# Patient Record
Sex: Female | Born: 1960 | Hispanic: No | Marital: Single | Smoking: Former smoker
Health system: Southern US, Community
[De-identification: ages and names within clinical notes are randomized; demographics above are authoritative.]

## PROBLEM LIST (undated history)

## (undated) DIAGNOSIS — K449 Diaphragmatic hernia without obstruction or gangrene: Secondary | ICD-10-CM

## (undated) DIAGNOSIS — E785 Hyperlipidemia, unspecified: Secondary | ICD-10-CM

## (undated) DIAGNOSIS — R000 Tachycardia, unspecified: Secondary | ICD-10-CM

## (undated) DIAGNOSIS — Z9889 Other specified postprocedural states: Secondary | ICD-10-CM

## (undated) DIAGNOSIS — H269 Unspecified cataract: Secondary | ICD-10-CM

## (undated) DIAGNOSIS — K2270 Barrett's esophagus without dysplasia: Secondary | ICD-10-CM

## (undated) DIAGNOSIS — I739 Peripheral vascular disease, unspecified: Secondary | ICD-10-CM

## (undated) DIAGNOSIS — K5790 Diverticulosis of intestine, part unspecified, without perforation or abscess without bleeding: Secondary | ICD-10-CM

## (undated) DIAGNOSIS — R112 Nausea with vomiting, unspecified: Secondary | ICD-10-CM

## (undated) DIAGNOSIS — T7840XA Allergy, unspecified, initial encounter: Secondary | ICD-10-CM

## (undated) DIAGNOSIS — K219 Gastro-esophageal reflux disease without esophagitis: Secondary | ICD-10-CM

## (undated) DIAGNOSIS — Z972 Presence of dental prosthetic device (complete) (partial): Secondary | ICD-10-CM

## (undated) DIAGNOSIS — I10 Essential (primary) hypertension: Secondary | ICD-10-CM

## (undated) DIAGNOSIS — E119 Type 2 diabetes mellitus without complications: Secondary | ICD-10-CM

## (undated) HISTORY — PX: WISDOM TOOTH EXTRACTION: SHX21

## (undated) HISTORY — DX: Diverticulosis of intestine, part unspecified, without perforation or abscess without bleeding: K57.90

## (undated) HISTORY — DX: Unspecified cataract: H26.9

## (undated) HISTORY — DX: Allergy, unspecified, initial encounter: T78.40XA

## (undated) HISTORY — DX: Essential (primary) hypertension: I10

## (undated) HISTORY — PX: APPENDECTOMY: SHX54

## (undated) HISTORY — PX: COLONOSCOPY: SHX174

## (undated) HISTORY — PX: OTHER SURGICAL HISTORY: SHX169

## (undated) HISTORY — DX: Type 2 diabetes mellitus without complications: E11.9

## (undated) HISTORY — DX: Hyperlipidemia, unspecified: E78.5

## (undated) HISTORY — DX: Gastro-esophageal reflux disease without esophagitis: K21.9

## (undated) HISTORY — DX: Diaphragmatic hernia without obstruction or gangrene: K44.9

## (undated) HISTORY — DX: Tachycardia, unspecified: R00.0

## (undated) HISTORY — DX: Barrett's esophagus without dysplasia: K22.70

## (undated) HISTORY — PX: CATARACT EXTRACTION: SUR2

---

## 2013-09-12 ENCOUNTER — Ambulatory Visit (INDEPENDENT_AMBULATORY_CARE_PROVIDER_SITE_OTHER): Payer: BC Managed Care – PPO | Admitting: Family Medicine

## 2013-09-12 ENCOUNTER — Encounter: Payer: Self-pay | Admitting: Family Medicine

## 2013-09-12 VITALS — BP 154/83 | HR 96 | Ht 64.0 in | Wt 214.0 lb

## 2013-09-12 DIAGNOSIS — Z794 Long term (current) use of insulin: Secondary | ICD-10-CM

## 2013-09-12 DIAGNOSIS — E118 Type 2 diabetes mellitus with unspecified complications: Secondary | ICD-10-CM | POA: Insufficient documentation

## 2013-09-12 DIAGNOSIS — I10 Essential (primary) hypertension: Secondary | ICD-10-CM

## 2013-09-12 DIAGNOSIS — Z1211 Encounter for screening for malignant neoplasm of colon: Secondary | ICD-10-CM

## 2013-09-12 DIAGNOSIS — E785 Hyperlipidemia, unspecified: Secondary | ICD-10-CM

## 2013-09-12 DIAGNOSIS — E1149 Type 2 diabetes mellitus with other diabetic neurological complication: Secondary | ICD-10-CM

## 2013-09-12 DIAGNOSIS — R8290 Unspecified abnormal findings in urine: Secondary | ICD-10-CM

## 2013-09-12 DIAGNOSIS — E1142 Type 2 diabetes mellitus with diabetic polyneuropathy: Secondary | ICD-10-CM

## 2013-09-12 DIAGNOSIS — R82998 Other abnormal findings in urine: Secondary | ICD-10-CM

## 2013-09-12 DIAGNOSIS — E119 Type 2 diabetes mellitus without complications: Secondary | ICD-10-CM

## 2013-09-12 HISTORY — DX: Hyperlipidemia, unspecified: E78.5

## 2013-09-12 HISTORY — DX: Essential (primary) hypertension: I10

## 2013-09-12 HISTORY — DX: Type 2 diabetes mellitus without complications: E11.9

## 2013-09-12 LAB — POCT URINALYSIS DIPSTICK
BILIRUBIN UA: NEGATIVE
Ketones, UA: NEGATIVE
NITRITE UA: NEGATIVE
PH UA: 7.5
PROTEIN UA: NEGATIVE
RBC UA: NEGATIVE
Spec Grav, UA: 1.015
Urobilinogen, UA: 1

## 2013-09-12 MED ORDER — LOSARTAN POTASSIUM-HCTZ 100-25 MG PO TABS: 1.0000 | ORAL_TABLET | Freq: Every day | ORAL | Status: DC

## 2013-09-12 MED ORDER — PREGABALIN 50 MG PO CAPS: 50.0000 mg | ORAL_CAPSULE | Freq: Three times a day (TID) | ORAL | Status: DC

## 2013-09-12 MED ORDER — CIPROFLOXACIN HCL 250 MG PO TABS: ORAL_TABLET | ORAL | Status: AC

## 2013-09-12 NOTE — Addendum Note (Signed)
Addended by: Terance Hart on: 09/12/2013 10:53 AM   Modules accepted: Orders

## 2013-09-12 NOTE — Progress Notes (Signed)
CC: Julie Prince is a 53 y.o. female is here for Establish Care   Subjective: HPI:  Very pleasant 53 year old here to establish care  Patient reports a history of type 2 diabetes currently on glimepiride and leve hemir. She believes her A1c was just slightly above 7 when it was checked back in May with her endocrinologist.  Denies polyuria polyphasia polydipsia nor poorly healing wounds. On review of systems she does endorse bilateral feet discomfort described as tingling, heaviness, burning that is absent when active and only present with rest. Symptoms are mild in severity and present on a daily basis. Nothing particularly makes better or worse and there has been no overlying skin changes nor swelling.  Reports a history of hyperlipidemia without right upper quadrant pain or myalgias since starting pravastatin on a daily basis. She believes her cholesterol checked within the last year.  Reports a history of essential hypertension and has been on losartan-hydrochlorothiazide for a month now. No outside blood pressures to report other than it being high to a degree that she thinks was about 140/90 when she saw her endocrinologist last.  She is in acute complaint of malodorous urine for the last one-2 days. She's worried that she might have a UTI. Denies dysuria, urgency, frequency nor any other genitourinary complaints.  She tells me that she's never had a colonoscopy and wants to know if she's due for this.   Review of Systems - General ROS: negative for - chills, fever, night sweats, weight gain or weight loss Ophthalmic ROS: negative for - decreased vision Psychological ROS: negative for - anxiety or depression ENT ROS: negative for - hearing change, nasal congestion, tinnitus or allergies Hematological and Lymphatic ROS: negative for - bleeding problems, bruising or swollen lymph nodes Breast ROS: negative Respiratory ROS: no cough, shortness of breath, or wheezing Cardiovascular ROS:  no chest pain or dyspnea on exertion Gastrointestinal ROS: no abdominal pain, change in bowel habits, or black or bloody stools Genito-Urinary ROS: negative for - genital discharge, genital ulcers, incontinence or abnormal bleeding from genitals Musculoskeletal ROS: negative for - joint pain or muscle pain Neurological ROS: negative for - headaches or memory loss Dermatological ROS: negative for lumps, mole changes, rash and skin lesion changes  Past Medical History  Diagnosis Date  . Essential hypertension, benign 09/12/2013  . Hyperlipidemia 09/12/2013  . Type 2 diabetes mellitus 09/12/2013    Dr. Posey Pronto at [place]     No past surgical history on file. No family history on file.  History   Social History  . Marital Status: Married    Spouse Name: N/A    Number of Children: N/A  . Years of Education: N/A   Occupational History  . Not on file.   Social History Main Topics  . Smoking status: Not on file  . Smokeless tobacco: Not on file  . Alcohol Use: Not on file  . Drug Use: Not on file  . Sexual Activity: Not on file   Other Topics Concern  . Not on file   Social History Narrative  . No narrative on file     Objective: BP 154/83  Pulse 96  Ht 5\' 4"  (1.626 m)  Wt 214 lb (97.07 kg)  BMI 36.72 kg/m2   General: Alert and Oriented, No Acute Distress HEENT: Pupils equal, round, reactive to light. Conjunctivae clear.   moist mucous membrane pharynx unremarkable  Lungs: Clear to auscultation bilaterally, no wheezing/ronchi/rales.  Comfortable work of breathing. Good air movement. Cardiac:  Regular rate and rhythm. Normal S1/S2.  No murmurs, rubs, nor gallops.   no carotid bruits  Abdomen:  obese and soft  Extremities: No peripheral edema.  Strong peripheral pulses.  no overlying skin changes in either foot, pain is not reproduced with palpation of navicular, medial or lateral malleoli nor base of the fifth metatarsal. There is no swelling redness or warmth in  either foot  Mental Status: No depression, anxiety, nor agitation. Skin: Warm and dry.  Assessment & Plan: Julie Prince was seen today for establish care.  Diagnoses and associated orders for this visit:  Type 2 diabetes mellitus without complication  Hyperlipidemia  Essential hypertension, benign - losartan-hydrochlorothiazide (HYZAAR) 100-25 MG per tablet; Take 1 tablet by mouth daily.  Malodorous urine  Special screening for malignant neoplasms, colon - Ambulatory referral to Gastroenterology  Diabetic peripheral neuropathy associated with type 2 diabetes mellitus - pregabalin (LYRICA) 50 MG capsule; Take 1 capsule (50 mg total) by mouth 3 (three) times daily.     type 2 diabetes: Management deferred to endocrinology   hyperlipidemia: Requesting outside records of her most recent lipid panel and liver enzymes Malodorous urine: Start Cipro we will follow culture She's overdue for routine colon cancer screening and a referral has been placed I've asked her to call me if she's not in contact about scheduling this by next week Diabetic peripheral neuropathy: Start Lyrica 3 weeks of samples were provided, call me after 2 weeks if it provided any benefit so I can send in a formal prescription Essential hypertension: Uncontrolled chronic condition increasing Hyzaar  Return in about 3 months (around 12/13/2013) for BP and Diabetic Followup.

## 2013-09-14 LAB — URINE CULTURE: Colony Count: >=100000

## 2013-10-03 DIAGNOSIS — G5600 Carpal tunnel syndrome, unspecified upper limb: Secondary | ICD-10-CM | POA: Insufficient documentation

## 2013-10-03 DIAGNOSIS — M5136 Other intervertebral disc degeneration, lumbar region: Secondary | ICD-10-CM | POA: Insufficient documentation

## 2013-10-03 DIAGNOSIS — M51369 Other intervertebral disc degeneration, lumbar region without mention of lumbar back pain or lower extremity pain: Secondary | ICD-10-CM | POA: Insufficient documentation

## 2013-10-03 DIAGNOSIS — G629 Polyneuropathy, unspecified: Secondary | ICD-10-CM | POA: Insufficient documentation

## 2013-10-03 DIAGNOSIS — M659 Synovitis and tenosynovitis, unspecified: Secondary | ICD-10-CM | POA: Insufficient documentation

## 2013-10-03 DIAGNOSIS — M545 Low back pain, unspecified: Secondary | ICD-10-CM | POA: Insufficient documentation

## 2013-10-03 DIAGNOSIS — M1990 Unspecified osteoarthritis, unspecified site: Secondary | ICD-10-CM | POA: Insufficient documentation

## 2013-10-03 DIAGNOSIS — E1165 Type 2 diabetes mellitus with hyperglycemia: Secondary | ICD-10-CM | POA: Insufficient documentation

## 2013-10-03 DIAGNOSIS — Q664 Congenital talipes calcaneovalgus, unspecified foot: Secondary | ICD-10-CM | POA: Insufficient documentation

## 2013-10-03 DIAGNOSIS — M47816 Spondylosis without myelopathy or radiculopathy, lumbar region: Secondary | ICD-10-CM | POA: Insufficient documentation

## 2013-12-03 ENCOUNTER — Ambulatory Visit: Payer: BC Managed Care – PPO | Admitting: Family Medicine

## 2013-12-13 ENCOUNTER — Ambulatory Visit: Payer: BC Managed Care – PPO | Admitting: Family Medicine

## 2013-12-18 ENCOUNTER — Encounter: Payer: Self-pay | Admitting: Family Medicine

## 2013-12-18 ENCOUNTER — Ambulatory Visit (INDEPENDENT_AMBULATORY_CARE_PROVIDER_SITE_OTHER): Payer: BC Managed Care – PPO | Admitting: Family Medicine

## 2013-12-18 VITALS — BP 160/87 | HR 89 | Wt 219.0 lb

## 2013-12-18 DIAGNOSIS — I10 Essential (primary) hypertension: Secondary | ICD-10-CM | POA: Diagnosis not present

## 2013-12-18 DIAGNOSIS — E1142 Type 2 diabetes mellitus with diabetic polyneuropathy: Secondary | ICD-10-CM

## 2013-12-18 DIAGNOSIS — E1149 Type 2 diabetes mellitus with other diabetic neurological complication: Secondary | ICD-10-CM

## 2013-12-18 MED ORDER — PREGABALIN 150 MG PO CAPS: 150.0000 mg | ORAL_CAPSULE | Freq: Two times a day (BID) | ORAL | Status: DC

## 2013-12-18 MED ORDER — AMLODIPINE BESYLATE 5 MG PO TABS: 5.0000 mg | ORAL_TABLET | Freq: Every day | ORAL | Status: DC

## 2013-12-18 NOTE — Progress Notes (Signed)
CC: Julie Prince is a 53 y.o. female is here for f/u lyrica   Subjective: HPI:  Followup type 2 diabetes: He continues to get care from Dr. Posey Pronto, there have been no changes to her medication regimen. She is uncertain what her last A1c was and she has no outside blood sugars to report. Denies Polyuria polyphasia or polydipsia  Essential hypertension: At her last visit Hyzaar was doubled to a full dose. She's been taking this on a daily basis for the past 2 months. She had a headache this morning that resolved without intervention denies any other headaches, motor or sensory disturbances other than that described below, chest pain, shortness of breath orthopnea nor peripheral edema. Denies any known side effects from this medication  Followup diabetic peripheral neuropathy: She was taking Lyrica 50 mg 3 times a day and states it helped with the tingling and burning of her feet which actually resolved after stopping the medication but she still has a heaviness that is described as moderate in severity present only with activity and improves with getting up and walking. It sounds like there is a mild numbness component to this, it is bilateral and symmetrical. She denies any other motor or sensory disturbances elsewhere   Review Of Systems Outlined In HPI  Past Medical History  Diagnosis Date  . Essential hypertension, benign 09/12/2013  . Hyperlipidemia 09/12/2013  . Type 2 diabetes mellitus 09/12/2013    Dr. Posey Pronto at [place]     No past surgical history on file. No family history on file.  History   Social History  . Marital Status: Married    Spouse Name: N/A    Number of Children: N/A  . Years of Education: N/A   Occupational History  . Not on file.   Social History Main Topics  . Smoking status: Never Smoker   . Smokeless tobacco: Not on file  . Alcohol Use: Not on file  . Drug Use: Not on file  . Sexual Activity: Not on file   Other Topics Concern  . Not on  file   Social History Narrative  . No narrative on file     Objective: BP 160/87  Pulse 89  Wt 219 lb (99.338 kg)  General: Alert and Oriented, No Acute Distress HEENT: Pupils equal, round, reactive to light. Conjunctivae clear.  Moist membranes pharynx unremarkable. Lungs: Clear to auscultation bilaterally, no wheezing/ronchi/rales.  Comfortable work of breathing. Good air movement. Cardiac: Regular rate and rhythm. Normal S1/S2.  No murmurs, rubs, nor gallops.   Diabetic Foot Exam: Dorsalis pedis pulses 1+ bilaterally.  Light touch sensation intact on plantar and dorsal surface bilaterally.  No signs of infection, skin breakdown, nor ulceration. Extremities: No peripheral edema.  Strong peripheral pulses.  Mental Status: No depression, anxiety, nor agitation. Skin: Warm and dry.  Assessment & Plan: [name] was seen today for f/u lyrica.  Diagnoses and associated orders for this visit:  Diabetic peripheral neuropathy associated with type 2 diabetes mellitus - pregabalin (LYRICA) 150 MG capsule; Take 1 capsule (150 mg total) by mouth 2 (two) times daily.  Essential hypertension, benign - amLODipine (NORVASC) 5 MG tablet; Take 1 tablet (5 mg total) by mouth daily.    Diabetic peripheral neuropathy: Uncontrolled restart Lyrica 150 mg twice a day Essential hypertension: Uncontrolled chronic condition continue Hyzaar and starting amlodipine   Return in about 4 weeks (around 01/15/2014) for BP.

## 2013-12-26 ENCOUNTER — Telehealth: Payer: Self-pay | Admitting: Family Medicine

## 2013-12-26 ENCOUNTER — Telehealth: Payer: Self-pay | Admitting: *Deleted

## 2013-12-26 DIAGNOSIS — E1142 Type 2 diabetes mellitus with diabetic polyneuropathy: Secondary | ICD-10-CM

## 2013-12-26 MED ORDER — GABAPENTIN 300 MG PO CAPS: 300.0000 mg | ORAL_CAPSULE | Freq: Three times a day (TID) | ORAL | Status: DC

## 2013-12-26 NOTE — Telephone Encounter (Signed)
Julie Prince, Will you please let patient know that [employer] contacted me about not covering Lyrica until she tries alternative cheaper options for her diabetic nerve pain.  I'd recommend she try gabapentin which I've sent to her [facility] pharmacy as an alternative. Stop any lyrica she has left over.

## 2013-12-26 NOTE — Telephone Encounter (Signed)
Pt called and left a message that she wants all rx sent to [facility] in [place]. I called and canceled gabapentin rx that was sent and called it in to [facility] in [place]

## 2013-12-26 NOTE — Telephone Encounter (Signed)
Pt.notified

## 2014-01-02 ENCOUNTER — Telehealth: Payer: Self-pay | Admitting: *Deleted

## 2014-01-02 DIAGNOSIS — K5229 Other allergic and dietetic gastroenteritis and colitis: Secondary | ICD-10-CM

## 2014-01-02 NOTE — Telephone Encounter (Signed)
Pt requested a referral for food allergy testing; She states she has a lot of bloating and abd discomfort and"swelling" at times  she feels she is allergic to something she eats but she cannot pinpoint.  I am going to place  a referral to an allergist for her.

## 2014-01-21 ENCOUNTER — Ambulatory Visit: Payer: BC Managed Care – PPO | Admitting: Family Medicine

## 2014-01-23 ENCOUNTER — Encounter: Payer: Self-pay | Admitting: Family Medicine

## 2014-01-23 DIAGNOSIS — J309 Allergic rhinitis, unspecified: Secondary | ICD-10-CM | POA: Insufficient documentation

## 2014-01-29 ENCOUNTER — Ambulatory Visit: Payer: BC Managed Care – PPO | Admitting: Family Medicine

## 2014-02-11 ENCOUNTER — Telehealth: Payer: Self-pay | Admitting: Family Medicine

## 2014-02-11 ENCOUNTER — Encounter: Payer: Self-pay | Admitting: Internal Medicine

## 2014-02-11 DIAGNOSIS — Z1211 Encounter for screening for malignant neoplasm of colon: Secondary | ICD-10-CM

## 2014-02-11 NOTE — Telephone Encounter (Signed)
Placing updated colonoscopy referral

## 2014-02-18 ENCOUNTER — Ambulatory Visit (INDEPENDENT_AMBULATORY_CARE_PROVIDER_SITE_OTHER): Payer: BC Managed Care – HMO | Admitting: Family Medicine

## 2014-02-18 ENCOUNTER — Encounter: Payer: Self-pay | Admitting: Family Medicine

## 2014-02-18 VITALS — BP 173/80 | HR 79 | Wt 217.0 lb

## 2014-02-18 DIAGNOSIS — G629 Polyneuropathy, unspecified: Secondary | ICD-10-CM

## 2014-02-18 DIAGNOSIS — E1142 Type 2 diabetes mellitus with diabetic polyneuropathy: Secondary | ICD-10-CM

## 2014-02-18 DIAGNOSIS — E1342 Other specified diabetes mellitus with diabetic polyneuropathy: Secondary | ICD-10-CM | POA: Diagnosis not present

## 2014-02-18 DIAGNOSIS — K2100 Gastro-esophageal reflux disease with esophagitis, without bleeding: Secondary | ICD-10-CM

## 2014-02-18 MED ORDER — PANTOPRAZOLE SODIUM 40 MG PO TBEC: 40.0000 mg | DELAYED_RELEASE_TABLET | Freq: Every day | ORAL | Status: DC

## 2014-02-18 MED ORDER — PREGABALIN 150 MG PO CAPS: 150.0000 mg | ORAL_CAPSULE | Freq: Two times a day (BID) | ORAL | Status: DC

## 2014-02-18 NOTE — Progress Notes (Signed)
CC: Julie Prince is a 53 y.o. female is here for GERD?   Subjective: HPI:  Complains of abdominal bloating which is generalized and accompanied by a burning sensation that radiates up behind her sternum into the back of her mouth. Symptoms are moderate in severity and worse after eating large meals. Symptoms are slightly improved with taking probiotics. No other interventions as of yet. She denies any change to her bowel habits and denies diarrhea or constipation. She denies awakening because of the pain. Denies vomiting or decreased appetite. Symptoms present for at least 2-3 weeks now.  Complaints of continued burning in both feet. She is afraid that she is having circulation issues and might need an amputation in the near future. Symptoms are worse at rest and particularly bad when trying to fall asleep. No benefit from gabapentin. Denies any new skin changes overlying the feet or swelling of the feet.   Review Of Systems Outlined In HPI  Past Medical History  Diagnosis Date  . Essential hypertension, benign 09/12/2013  . Hyperlipidemia 09/12/2013  . Type 2 diabetes mellitus 09/12/2013    Dr. Posey Pronto at [place]     No past surgical history on file. No family history on file.  History   Social History  . Marital Status: Married    Spouse Name: N/A    Number of Children: N/A  . Years of Education: N/A   Occupational History  . Not on file.   Social History Main Topics  . Smoking status: Never Smoker   . Smokeless tobacco: Not on file  . Alcohol Use: Not on file  . Drug Use: Not on file  . Sexual Activity: Not on file   Other Topics Concern  . Not on file   Social History Narrative     Objective: BP 173/80 mmHg  Pulse 79  Wt 217 lb (98.431 kg)  General: Alert and Oriented, No Acute Distress HEENT: Pupils equal, round, reactive to light. Conjunctivae clear.  Moist because membranes pharynx unremarkable Lungs: Clear comfortable work of breathing Cardiac:  Regular rate and rhythm.  Abdomen: Obese, Normal bowel sounds, soft and non tender without palpable masses. Diabetic Foot Exam: Dorsalis pedis pulses 1+ bilaterally.  Monofilament sensation intact on plantar and dorsal surface bilaterally.  No signs of infection, skin breakdown, nor ulceration. Extremities: No peripheral edema.  Strong peripheral pulses.  Mental Status: No depression, anxiety, nor agitation. Skin: Warm and dry.  Assessment & Plan: [name] was seen today for gerd?.  Diagnoses and associated orders for this visit:  Gastroesophageal reflux disease with esophagitis - pantoprazole (PROTONIX) 40 MG tablet; Take 1 tablet (40 mg total) by mouth daily.  Diabetic peripheral neuropathy - pregabalin (LYRICA) 150 MG capsule; Take 1 capsule (150 mg total) by mouth 2 (two) times daily.    GERD: Uncontrolled start Protonix Diabetic peripheral neuropathy: Uncontrolled chronic condition re-prescribing Lyrica in hopes that her insurance will not cover this now that she has failed on gabapentin. I used a Doppler today to acoustically reinforce to her that she has good circulation in her lower extremities.  She tells me she has not taken her blood pressure medication over 24 hours due to forgetfulness. No change to blood pressure medications at this time given this.   Return in about 2 months (around 04/21/2014).

## 2014-02-19 ENCOUNTER — Telehealth: Payer: Self-pay | Admitting: *Deleted

## 2014-02-19 NOTE — Telephone Encounter (Signed)
Lyrica approved. Pharmacy and patient notified. Margette Fast, CMA

## 2014-03-11 ENCOUNTER — Ambulatory Visit (AMBULATORY_SURGERY_CENTER): Payer: Self-pay | Admitting: *Deleted

## 2014-03-11 VITALS — Ht 64.0 in | Wt 219.8 lb

## 2014-03-11 DIAGNOSIS — Z1211 Encounter for screening for malignant neoplasm of colon: Secondary | ICD-10-CM

## 2014-03-11 MED ORDER — MOVIPREP 100 G PO SOLR: 1.0000 | Freq: Once | ORAL | Status: DC

## 2014-03-11 NOTE — Progress Notes (Signed)
No egg or soy allergy. ewm No home 02 use. ewm No diet pills. ewm No issues with past sedation. ewm Pt emmi video to e mail. ewm

## 2014-03-24 ENCOUNTER — Encounter: Payer: Self-pay | Admitting: Internal Medicine

## 2014-03-24 ENCOUNTER — Ambulatory Visit (AMBULATORY_SURGERY_CENTER): Payer: BLUE CROSS/BLUE SHIELD | Admitting: Internal Medicine

## 2014-03-24 VITALS — BP 142/82 | HR 72 | Temp 97.2°F | Resp 15 | Ht 64.0 in | Wt 219.0 lb

## 2014-03-24 DIAGNOSIS — Z1211 Encounter for screening for malignant neoplasm of colon: Secondary | ICD-10-CM

## 2014-03-24 DIAGNOSIS — D128 Benign neoplasm of rectum: Secondary | ICD-10-CM

## 2014-03-24 DIAGNOSIS — D129 Benign neoplasm of anus and anal canal: Secondary | ICD-10-CM

## 2014-03-24 MED ORDER — SODIUM CHLORIDE 0.9 % IV SOLN: 500.0000 mL | INTRAVENOUS | Status: DC

## 2014-03-24 NOTE — Op Note (Signed)
[facility]  [address]. [place], [address]   COLONOSCOPY PROCEDURE REPORT  PATIENT: Julie, Prince  MR#: [id number] BIRTHDATE: 1961/02/24 , 18  yrs. old GENDER: female ENDOSCOPIST: Jerene Bears, MD REFERRED BY: Marcial Pacas, DO PROCEDURE DATE:  03/24/2014 PROCEDURE:   Colonoscopy with snare polypectomy First Screening Colonoscopy - Avg.  risk and is 50 yrs.  old or older Yes.  Prior Negative Screening - Now for repeat screening. N/A  History of Adenoma - Now for follow-up colonoscopy & has been > or = to 3 yrs.  N/A  Polyps Removed Today? Yes. ASA CLASS:   Class III INDICATIONS:average risk for colon cancer and first colonoscopy. MEDICATIONS: Monitored anesthesia care and Propofol 300 mg IV  DESCRIPTION OF PROCEDURE:   After the risks benefits and alternatives of the procedure were thoroughly explained, informed consent was obtained.  The digital rectal exam revealed no rectal mass.   The LB [id number]  endoscope was introduced through the anus and advanced to the cecum, which was identified by both the appendix and ileocecal valve. No adverse events experienced. The quality of the prep was good, using MoviPrep  The instrument was then slowly withdrawn as the colon was fully examined.  COLON FINDINGS: A semi-pedunculated polyp ranging from 15 to 70mm in size was found in the rectum and sigmoid colon.  A polypectomy was performed using snare cautery.  The resection was complete, the polyp tissue was completely retrieved and sent to histology.  The wound at the site was closed by placing hemoclips.  Two (2) placements were made. There was mild diverticulosis noted in the proximal transverse colon and ascending colon.  Retroflexed views revealed no abnormalities. The time to cecum=4 minutes 22 seconds. Withdrawal time=14 minutes 40 seconds.  The scope was withdrawn and the procedure completed. COMPLICATIONS: There were no immediate  complications.  ENDOSCOPIC IMPRESSION: 1.   Semi-pedunculated polyp ranging from 15 to 46mm in size was found in the rectum and sigmoid colon; polypectomy was performed using snare cautery; the wound at the site was closed by placing hemoclips 2.   Mild diverticulosis was noted in the proximal transverse colon and ascending colon  RECOMMENDATIONS: 1.  Await pathology results 2.  Hold Aspirin and all other NSAIDS for 2 weeks. 3.  Timing of repeat colonoscopy will be determined by pathology findings. 4.  You will receive a letter within 1-2 weeks with the results of your biopsy as well as final recommendations.  Please call my office if you have not received a letter after 3 weeks.  eSigned:  Jerene Bears, MD 03/24/2014 2:35 PM   cc: The Patient, Marcial Pacas, DO

## 2014-03-24 NOTE — Patient Instructions (Addendum)
No aspirin, aspirin products or NSAIDS for two weeks, January 18,2015. Handouts given for Polyps, diverticulosis and high fiber.     YOU HAD AN ENDOSCOPIC PROCEDURE TODAY AT [facility]: Refer to the procedure report that was given to you for any specific questions about what was found during the examination.  If the procedure report does not answer your questions, please call your gastroenterologist to clarify.  If you requested that your care partner not be given the details of your procedure findings, then the procedure report has been included in a sealed envelope for you to review at your convenience later.  YOU SHOULD EXPECT: Some feelings of bloating in the abdomen. Passage of more gas than usual.  Walking can help get rid of the air that was put into your GI tract during the procedure and reduce the bloating. If you had a lower endoscopy (such as a colonoscopy or flexible sigmoidoscopy) you may notice spotting of blood in your stool or on the toilet paper. If you underwent a bowel prep for your procedure, then you may not have a normal bowel movement for a few days.  DIET: Your first meal following the procedure should be a light meal and then it is ok to progress to your normal diet.  A half-sandwich or bowl of soup is an example of a good first meal.  Heavy or fried foods are harder to digest and may make you feel nauseous or bloated.  Likewise meals heavy in dairy and vegetables can cause extra gas to form and this can also increase the bloating.  Drink plenty of fluids but you should avoid alcoholic beverages for 24 hours.  ACTIVITY: Your care partner should take you home directly after the procedure.  You should plan to take it easy, moving slowly for the rest of the day.  You can resume normal activity the day after the procedure however you should NOT DRIVE or use heavy machinery for 24 hours (because of the sedation medicines used during the test).    SYMPTOMS TO  REPORT IMMEDIATELY: A gastroenterologist can be reached at any hour.  During normal business hours, 8:30 AM to 5:00 PM Monday through Friday, call [phone].  After hours and on weekends, please call the GI answering service at [phone] who will take a message and have the physician on call contact you.   Following lower endoscopy (colonoscopy or flexible sigmoidoscopy):  Excessive amounts of blood in the stool  Significant tenderness or worsening of abdominal pains  Swelling of the abdomen that is new, acute  Fever of 100F or higher  FOLLOW UP: If any biopsies were taken you will be contacted by phone or by letter within the next 1-3 weeks.  Call your gastroenterologist if you have not heard about the biopsies in 3 weeks.  Our staff will call the home number listed on your records the next business day following your procedure to check on you and address any questions or concerns that you may have at that time regarding the information given to you following your procedure. This is a courtesy call and so if there is no answer at the home number and we have not heard from you through the emergency physician on call, we will assume that you have returned to your regular daily activities without incident.  SIGNATURES/CONFIDENTIALITY: You and/or your care partner have signed paperwork which will be entered into your electronic medical record.  These signatures attest to the fact that  that the information above on your After Visit Summary has been reviewed and is understood.  Full responsibility of the confidentiality of this discharge information lies with you and/or your care-partner.

## 2014-03-24 NOTE — Progress Notes (Signed)
Called to room to assist during endoscopic procedure.  Patient ID and intended procedure confirmed with present staff. Received instructions for my participation in the procedure from the performing physician.  

## 2014-03-24 NOTE — Progress Notes (Signed)
Procedure ends, to recovery, report given and VSS. 

## 2014-03-25 ENCOUNTER — Telehealth: Payer: Self-pay | Admitting: *Deleted

## 2014-03-25 NOTE — Telephone Encounter (Signed)
Left message that we called for f/u 

## 2014-04-02 ENCOUNTER — Encounter: Payer: Self-pay | Admitting: Internal Medicine

## 2014-04-03 ENCOUNTER — Encounter: Payer: Self-pay | Admitting: Family Medicine

## 2014-04-03 DIAGNOSIS — D126 Benign neoplasm of colon, unspecified: Secondary | ICD-10-CM | POA: Insufficient documentation

## 2014-04-08 ENCOUNTER — Telehealth: Payer: Self-pay | Admitting: Internal Medicine

## 2014-04-08 NOTE — Telephone Encounter (Signed)
Pt had a question regarding her path letter. Discussed with pt that Dr. Hilarie Fredrickson wants to do a flex-sig in July to exam the area where the polyp was removed to make sure it is all removed. Pt verbalized understanding.

## 2014-04-15 ENCOUNTER — Encounter: Payer: BLUE CROSS/BLUE SHIELD | Admitting: Obstetrics & Gynecology

## 2014-07-31 ENCOUNTER — Encounter: Payer: Self-pay | Admitting: Family Medicine

## 2014-07-31 ENCOUNTER — Ambulatory Visit (INDEPENDENT_AMBULATORY_CARE_PROVIDER_SITE_OTHER): Payer: BLUE CROSS/BLUE SHIELD | Admitting: Family Medicine

## 2014-07-31 VITALS — BP 209/94 | HR 89 | Wt 211.0 lb

## 2014-07-31 DIAGNOSIS — E1142 Type 2 diabetes mellitus with diabetic polyneuropathy: Secondary | ICD-10-CM | POA: Diagnosis not present

## 2014-07-31 DIAGNOSIS — I10 Essential (primary) hypertension: Secondary | ICD-10-CM | POA: Diagnosis not present

## 2014-07-31 DIAGNOSIS — G629 Polyneuropathy, unspecified: Secondary | ICD-10-CM

## 2014-07-31 DIAGNOSIS — E119 Type 2 diabetes mellitus without complications: Secondary | ICD-10-CM | POA: Diagnosis not present

## 2014-07-31 DIAGNOSIS — K219 Gastro-esophageal reflux disease without esophagitis: Secondary | ICD-10-CM | POA: Insufficient documentation

## 2014-07-31 DIAGNOSIS — R609 Edema, unspecified: Secondary | ICD-10-CM

## 2014-07-31 LAB — BASIC METABOLIC PANEL WITH GFR
BUN: 11 mg/dL (ref 6–23)
CHLORIDE: 100 meq/L (ref 96–112)
CO2: 25 mEq/L (ref 19–32)
CREATININE: 0.79 mg/dL (ref 0.50–1.10)
Calcium: 9.3 mg/dL (ref 8.4–10.5)
GFR, EST NON AFRICAN AMERICAN: 86 mL/min
GLUCOSE: 398 mg/dL — AB (ref 70–99)
Potassium: 4.3 mEq/L (ref 3.5–5.3)
Sodium: 136 mEq/L (ref 135–145)

## 2014-07-31 LAB — HEMOGLOBIN A1C
HEMOGLOBIN A1C: 11.9 % — AB (ref <5.7)
MEAN PLASMA GLUCOSE: 295 mg/dL — AB (ref <117)

## 2014-07-31 MED ORDER — LOSARTAN POTASSIUM-HCTZ 100-25 MG PO TABS: 1.0000 | ORAL_TABLET | Freq: Every day | ORAL | Status: DC

## 2014-07-31 MED ORDER — FUROSEMIDE 20 MG PO TABS: 20.0000 mg | ORAL_TABLET | Freq: Every day | ORAL | Status: DC | PRN

## 2014-07-31 MED ORDER — INSULIN GLARGINE 300 UNIT/ML ~~LOC~~ SOPN: 22.0000 [IU] | PEN_INJECTOR | Freq: Every day | SUBCUTANEOUS | Status: DC

## 2014-07-31 MED ORDER — AMLODIPINE BESYLATE 5 MG PO TABS: 5.0000 mg | ORAL_TABLET | Freq: Every day | ORAL | Status: DC

## 2014-07-31 NOTE — Progress Notes (Addendum)
CC: Julie Prince is a 54 y.o. female is here for f/u colonoscopy   Subjective: HPI:  Follow-up type 2 diabetes: This going to be financially difficult for her to continue to see her endocrinologist she wants know if I can help with diabetes management. She is currently taking Amaryl on a daily basis twice a day. No outside blood sugars to report. She was once prescribed Levemir however she could not afford this medication as it was over $200 when she last checked. She was taking 22 units when she was able to afford this medication. No polyuria or polyphagia polydipsia. She tells and she feels great.  Follow-up essential hypertension: She has run out of all of her blood pressure medication and would like refills. It's been at least a week since she ran out of these medications. Denies chest pain shortness of breath orthopnea nor peripheral edema  Follow-up diabetic peripheral neuropathy: She decided to not take Lyrica and currently denies any lower extremity discomfort other than some mild swelling in the ankles at the end of the day. She wants something she can take to get rid of what she considers fluid on the legs. No orthopnea nor peripheral edema elsewhere. Symptoms are worse along she is on her feet.  Follow-up GERD: Since starting Protonix no epigastric pain reflux or any abdominal pain.   Review Of Systems Outlined In HPI  Past Medical History  Diagnosis Date  . Essential hypertension, benign 09/12/2013  . Hyperlipidemia 09/12/2013  . Type 2 diabetes mellitus 09/12/2013    Dr. Posey Prince at [facility]   . Allergy   . Cataract   . GERD (gastroesophageal reflux disease)     Past Surgical History  Procedure Laterality Date  . Cataract extraction Left   . Right tube and ovary removed    . Cesarean section  1990   Family History  Problem Relation Age of Onset  . Colon cancer Neg Hx   . Esophageal cancer Neg Hx   . Rectal cancer Neg Hx   . Stomach cancer Neg Hx      History   Social History  . Marital Status: Married    Spouse Name: N/A  . Number of Children: N/A  . Years of Education: N/A   Occupational History  . Not on file.   Social History Main Topics  . Smoking status: Former Research scientist (life sciences)  . Smokeless tobacco: Never Used     Comment: quit 30 plus years ago.  . Alcohol Use: No  . Drug Use: No  . Sexual Activity: Not on file   Other Topics Concern  . Not on file   Social History Narrative     Objective: BP 209/94 mmHg  Pulse 89  Wt 211 lb (95.709 kg)  General: Alert and Oriented, No Acute Distress HEENT: Pupils equal, round, reactive to light. Conjunctivae clear.  Moist mucous membranes Lungs: Clear to auscultation bilaterally, no wheezing/ronchi/rales.  Comfortable work of breathing. Good air movement. Cardiac: Regular rate and rhythm. Normal S1/S2.  No murmurs, rubs, nor gallops.   Abdomen: mild obesity Extremities: trace edema in the ankles bilaterally symmetric and no overlying skin changes.  Strong peripheral pulses.  Mental Status: No depression, anxiety, nor agitation. Skin: Warm and dry.  Assessment & Plan: Julie Prince was seen today for f/u colonoscopy.  Diagnoses and all orders for this visit:  Type 2 diabetes mellitus without complication Orders: -     Insulin Glargine (TOUJEO SOLOSTAR) 300 UNIT/ML SOPN; Inject 22 Units into the skin  at bedtime. -     Hemoglobin A1c -     BASIC METABOLIC PANEL WITH GFR  Essential hypertension, benign Orders: -     amLODipine (NORVASC) 5 MG tablet; Take 1 tablet (5 mg total) by mouth daily. -     losartan-hydrochlorothiazide (HYZAAR) 100-25 MG per tablet; Take 1 tablet by mouth daily. -     BASIC METABOLIC PANEL WITH GFR  Diabetic peripheral neuropathy associated with type 2 diabetes mellitus  Gastroesophageal reflux disease without esophagitis  Edema Orders: -     furosemide (LASIX) 20 MG tablet; Take 1 tablet (20 mg total) by mouth daily as needed for fluid.   Type 2  diabetes: A1c today for baseline. Begin Toujeo, savings card provided. In renal function Essential hypertension: Uncontrolled off of all medications restart amlodipine and Hyzaar. Diabetic peripheral neuropathy: Currently controlled GERD: Controlled with Protonix Edema: Discussed salt reduction and as needed use of furosemide   Return in about 3 months (around 10/31/2014) for Blood sugar and pressure. Follow up sooner if blood pressure does not return below 140/90 1 weekafter restarting both blood pressure medications

## 2014-08-05 ENCOUNTER — Telehealth: Payer: Self-pay | Admitting: *Deleted

## 2014-08-05 MED ORDER — AMBULATORY NON FORMULARY MEDICATION: Status: DC

## 2014-08-05 NOTE — Telephone Encounter (Signed)
rx

## 2014-08-06 ENCOUNTER — Encounter: Payer: Self-pay | Admitting: Internal Medicine

## 2014-08-11 ENCOUNTER — Encounter: Payer: Self-pay | Admitting: Internal Medicine

## 2014-09-01 ENCOUNTER — Telehealth: Payer: Self-pay | Admitting: *Deleted

## 2014-09-01 MED ORDER — INSULIN PEN NEEDLE 30G X 8 MM MISC: 1.0000 | Status: DC | PRN

## 2014-09-02 NOTE — Telephone Encounter (Signed)
rx sent yesterday

## 2014-09-15 ENCOUNTER — Telehealth: Payer: Self-pay | Admitting: *Deleted

## 2014-09-15 MED ORDER — GLIMEPIRIDE 4 MG PO TABS: 4.0000 mg | ORAL_TABLET | Freq: Two times a day (BID) | ORAL | Status: DC

## 2014-09-15 NOTE — Telephone Encounter (Signed)
Yes, glimeperide and Toujeo, [facility] [place], [place]

## 2014-09-15 NOTE — Telephone Encounter (Signed)
lvm informing pt of recommendations. .Julie Prince  

## 2014-09-15 NOTE — Telephone Encounter (Signed)
Pt wants to know if she is supposed to take the Toujeo and the glimeperide together. She has not taken the glimeperide in a while but she states her blood sugars are still running high

## 2014-09-19 NOTE — Telephone Encounter (Signed)
close

## 2014-09-25 ENCOUNTER — Ambulatory Visit (AMBULATORY_SURGERY_CENTER): Payer: Self-pay

## 2014-09-25 VITALS — Ht 64.0 in | Wt 217.6 lb

## 2014-09-25 DIAGNOSIS — Z860100 Personal history of colon polyps, unspecified: Secondary | ICD-10-CM

## 2014-09-25 NOTE — Progress Notes (Signed)
No allergies to eggs or soy No diet/weight loss meds No home oxygen No past problems with anesthesia  Does not want to watch emmi instructions

## 2014-10-09 ENCOUNTER — Ambulatory Visit (AMBULATORY_SURGERY_CENTER): Payer: BLUE CROSS/BLUE SHIELD | Admitting: Internal Medicine

## 2014-10-09 ENCOUNTER — Encounter: Payer: Self-pay | Admitting: Internal Medicine

## 2014-10-09 VITALS — BP 139/76 | HR 71 | Temp 97.8°F | Resp 24 | Ht 64.0 in | Wt 217.0 lb

## 2014-10-09 DIAGNOSIS — Z86010 Personal history of colonic polyps: Secondary | ICD-10-CM | POA: Diagnosis not present

## 2014-10-09 DIAGNOSIS — D126 Benign neoplasm of colon, unspecified: Secondary | ICD-10-CM

## 2014-10-09 MED ORDER — SODIUM CHLORIDE 0.9 % IV SOLN: 500.0000 mL | INTRAVENOUS | Status: DC

## 2014-10-09 NOTE — Op Note (Signed)
[place]  [address] [place], [address]   FLEXIBLE SIGMOIDOSCOPY PROCEDURE REPORT  PATIENT: Julie Prince, Julie Prince  MR#: [id number] BIRTHDATE: [birth year] , 66  yrs. 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 MEDICATIONS: Monitored anesthesia care and Propofol 120 mg IV  DESCRIPTION OF PROCEDURE:   After the risks benefits and alternatives of the procedure were thoroughly explained, informed consent was obtained.  Digital exam revealed no abnormalities of the rectum. The LB [id number]  endoscope was introduced through the anus  and advanced to the sigmoid colon , The exam was Without limitations.    The quality of the prep was The overall prep quality was adequate. . Estimated blood loss is zero unless otherwise noted in this procedure report. The instrument was then slowly withdrawn as the mucosa was fully examined.      COLON FINDINGS: A post-polypectomy scar was noted in the rectum with no residual adenoma seen.   The colonic mucosa appeared normal in the rectum and distal sigmoid colon.    Retroflexed views revealed no abnormalities.    The scope was then withdrawn from the patient and the procedure terminated.  COMPLICATIONS: There were no immediate complications.  ENDOSCOPIC IMPRESSION: 1.   Post-polypectomy scar in the rectum with no residual adenomatous tissue seen 2.   The colonic mucosa appeared normal in the rectum and examined portions of the distal sigmoid colon  RECOMMENDATIONS: Repeat full colonoscopy in 3 years  eSigned:  Jerene Bears, MD 10/09/2014 1:45 PM   CC: the patient, Marcial Pacas, D.O.

## 2014-10-09 NOTE — Progress Notes (Signed)
A/ox3 pleased with MAC, report to Kristen RN 

## 2014-10-09 NOTE — Patient Instructions (Signed)
YOU HAD AN ENDOSCOPIC PROCEDURE TODAY AT [facility]:   Refer to the procedure report that was given to you for any specific questions about what was found during the examination.  If the procedure report does not answer your questions, please call your gastroenterologist to clarify.  If you requested that your care partner not be given the details of your procedure findings, then the procedure report has been included in a sealed envelope for you to review at your convenience later.  YOU SHOULD EXPECT: Some feelings of bloating in the abdomen. Passage of more gas than usual.  Walking can help get rid of the air that was put into your GI tract during the procedure and reduce the bloating. If you had a lower endoscopy (such as a colonoscopy or flexible sigmoidoscopy) you may notice spotting of blood in your stool or on the toilet paper. If you underwent a bowel prep for your procedure, you may not have a normal bowel movement for a few days.  Please Note:  You might notice some irritation and congestion in your nose or some drainage.  This is from the oxygen used during your procedure.  There is no need for concern and it should clear up in a day or so.  SYMPTOMS TO REPORT IMMEDIATELY:   Following lower endoscopy (colonoscopy or flexible sigmoidoscopy):  Excessive amounts of blood in the stool  Significant tenderness or worsening of abdominal pains  Swelling of the abdomen that is new, acute  Fever of 100F or higher  For urgent or emergent issues, a gastroenterologist can be reached at any hour by calling [phone].   DIET: Your first meal following the procedure should be a small meal and then it is ok to progress to your normal diet. Heavy or fried foods are harder to digest and may make you feel nauseous or bloated.  Likewise, meals heavy in dairy and vegetables can increase bloating.  Drink plenty of fluids but you should avoid alcoholic beverages for 24  hours.  ACTIVITY:  You should plan to take it easy for the rest of today and you should NOT DRIVE or use heavy machinery until tomorrow (because of the sedation medicines used during the test).    FOLLOW UP: Our staff will call the number listed on your records the next business day following your procedure to check on you and address any questions or concerns that you may have regarding the information given to you following your procedure. If we do not reach you, we will leave a message.  However, if you are feeling well and you are not experiencing any problems, there is no need to return our call.  We will assume that you have returned to your regular daily activities without incident.  SIGNATURES/CONFIDENTIALITY: You and/or your care partner have signed paperwork which will be entered into your electronic medical record.  These signatures attest to the fact that that the information above on your After Visit Summary has been reviewed and is understood.  Full responsibility of the confidentiality of this discharge information lies with you and/or your care-partner.  Continue your normal medications  Next full colonoscopy- 3 years

## 2014-10-10 ENCOUNTER — Telehealth: Payer: Self-pay | Admitting: *Deleted

## 2014-10-10 NOTE — Telephone Encounter (Signed)
  Follow up Call-no answer, left message to call if questions or concerns.     

## 2014-10-31 ENCOUNTER — Ambulatory Visit (INDEPENDENT_AMBULATORY_CARE_PROVIDER_SITE_OTHER): Payer: BLUE CROSS/BLUE SHIELD | Admitting: Family Medicine

## 2014-10-31 ENCOUNTER — Encounter: Payer: Self-pay | Admitting: Family Medicine

## 2014-10-31 VITALS — BP 141/77 | HR 85 | Ht 64.0 in | Wt 226.0 lb

## 2014-10-31 DIAGNOSIS — K219 Gastro-esophageal reflux disease without esophagitis: Secondary | ICD-10-CM

## 2014-10-31 DIAGNOSIS — E119 Type 2 diabetes mellitus without complications: Secondary | ICD-10-CM

## 2014-10-31 DIAGNOSIS — I10 Essential (primary) hypertension: Secondary | ICD-10-CM | POA: Diagnosis not present

## 2014-10-31 DIAGNOSIS — Z1239 Encounter for other screening for malignant neoplasm of breast: Secondary | ICD-10-CM

## 2014-10-31 LAB — POCT GLYCOSYLATED HEMOGLOBIN (HGB A1C): HEMOGLOBIN A1C: 11

## 2014-10-31 MED ORDER — PANTOPRAZOLE SODIUM 40 MG PO TBEC: 40.0000 mg | DELAYED_RELEASE_TABLET | Freq: Every day | ORAL | Status: DC

## 2014-10-31 MED ORDER — INSULIN GLARGINE 300 UNIT/ML ~~LOC~~ SOPN: 24.0000 [IU] | PEN_INJECTOR | Freq: Every day | SUBCUTANEOUS | Status: DC

## 2014-10-31 NOTE — Patient Instructions (Signed)
   Self Titration of Long Acting Insulin (Toujeo)  The following strategy can be used to optimize your blood sugar control and requires only once a day blood sugar testing.  Assuming that you are taking your long acting insulin every evening, begin by recording your fasting blood sugar every morning along with the number of units of long acting insulin you used the night before.  If you notice that on two consecutive days your fasting blood sugar is above 120 then add 2 units of long acting insulin to your evening regimen.  Continue this last step, adding 2 units of long acting insulin every 2 days until you find your fasting blood sugar consistently remains below 120.  The idea is to slowly increase your long acting insulin to avoid hypoglycemia.   Example:   Fasting Blood Sugar Last Night's Units Added Units  160 20 Units -  158 20 Units 2 Units  148 22 Units -  142 22 Units 2 Units  115 24 Units -  117 24 Units -  113 24 Units -  114 24 Units -  118 24 Units -

## 2014-10-31 NOTE — Progress Notes (Signed)
CC: Julie Prince is a 54 y.o. female is here for Hypertension and Diabetes   Subjective: HPI:  Nexium not working, Zantac not working in the past. Protonix seemed to work the best in the past however insurance would not cover it. She wants to know if she is eligible now to restart this. She tells me that it helps drastically with a bloating sensation in her epigastric region and gastric reflux.  Follow-up essential hypertension: Taking Hyzaar and amlodipine on a daily basis. No outside blood pressures to report. No chest pain shortness of breath orthopnea nor peripheral edema  Follow-up type 2 diabetes: No outside blood pressures to report. She tells me that since starting on Toujeo she feels that she has much more energy. No polyuria polyphagia polydipsia or hypoglycemic episodes.    Review Of Systems Outlined In HPI  Past Medical History  Diagnosis Date  . Essential hypertension, benign 09/12/2013  . Hyperlipidemia 09/12/2013  . Type 2 diabetes mellitus 09/12/2013    Dr. Posey Pronto at [facility]   . Allergy   . Cataract   . GERD (gastroesophageal reflux disease)     Past Surgical History  Procedure Laterality Date  . Cataract extraction Left   . Right tube and ovary removed    . Cesarean section  1990   Family History  Problem Relation Age of Onset  . Colon cancer Neg Hx   . Esophageal cancer Neg Hx   . Rectal cancer Neg Hx   . Stomach cancer Neg Hx   . Lung cancer Mother   . Heart disease Maternal Aunt     Social History   Social History  . Marital Status: Married    Spouse Name: N/A  . Number of Children: N/A  . Years of Education: N/A   Occupational History  . Not on file.   Social History Main Topics  . Smoking status: Former Research scientist (life sciences)  . Smokeless tobacco: Never Used     Comment: quit 30 plus years ago.  . Alcohol Use: No  . Drug Use: No  . Sexual Activity: Not on file   Other Topics Concern  . Not on file   Social History Narrative      Objective: BP 141/77 mmHg  Pulse 85  Ht 5\' 4"  (1.626 m)  Wt 226 lb (102.513 kg)  BMI 38.77 kg/m2  General: Alert and Oriented, No Acute Distress HEENT: Pupils equal, round, reactive to light. Conjunctivae clear.  Moist mucous membranes Lungs: Clear to auscultation bilaterally, no wheezing/ronchi/rales.  Comfortable work of breathing. Good air movement. Cardiac: Regular rate and rhythm. Normal S1/S2.  No murmurs, rubs, nor gallops.   Abdomen: Obese and soft Extremities: No peripheral edema.  Strong peripheral pulses.  Mental Status: No depression, anxiety, nor agitation. Skin: Warm and dry.  Assessment & Plan: Julie Prince was seen today for hypertension and diabetes.  Diagnoses and all orders for this visit:  Type 2 diabetes mellitus without complication -     POCT HgB A1C -     Insulin Glargine (TOUJEO SOLOSTAR) 300 UNIT/ML SOPN; Inject 24 Units into the skin at bedtime. Every week add an additional 2 units if fasting blood sugar above 120 -     Ambulatory referral to Podiatry  Essential hypertension, benign  Screening for malignant neoplasm of breast -     MM DIGITAL SCREENING BILATERAL; Future  Gastroesophageal reflux disease without esophagitis  Other orders -     pantoprazole (PROTONIX) 40 MG tablet; Take 1 tablet (40 mg  total) by mouth daily.   Type 2 diabetes: A1c of 11 today, uncontrolled, discussed increasing toujeo every week by 2 units until fasting blood sugar is at 120 or less. Start off at 24 units., Continue Frederik Schmidt. She would like a referral for podiatry to help trim toenails GERD: Uncontrolled, resubmitting Protonix Rx Essential hypertension: Uncontrolled chronic condition, she is optimistic that she can get into an exercise routine, she plans on joining the gym today for can help prevent having to increase amlodipine. Overdue for mammogram, referral placed.  Return in about 3 months (around 01/31/2015).

## 2014-11-11 ENCOUNTER — Ambulatory Visit: Payer: Self-pay | Admitting: Podiatry

## 2014-11-28 ENCOUNTER — Ambulatory Visit: Payer: Self-pay | Admitting: Podiatry

## 2015-01-30 ENCOUNTER — Encounter: Payer: Self-pay | Admitting: Family Medicine

## 2015-01-30 ENCOUNTER — Ambulatory Visit (INDEPENDENT_AMBULATORY_CARE_PROVIDER_SITE_OTHER): Payer: 59 | Admitting: Family Medicine

## 2015-01-30 VITALS — BP 147/74 | HR 88 | Wt 223.0 lb

## 2015-01-30 DIAGNOSIS — E1169 Type 2 diabetes mellitus with other specified complication: Secondary | ICD-10-CM | POA: Diagnosis not present

## 2015-01-30 DIAGNOSIS — Z794 Long term (current) use of insulin: Secondary | ICD-10-CM

## 2015-01-30 DIAGNOSIS — I10 Essential (primary) hypertension: Secondary | ICD-10-CM

## 2015-01-30 DIAGNOSIS — E119 Type 2 diabetes mellitus without complications: Secondary | ICD-10-CM | POA: Diagnosis not present

## 2015-01-30 DIAGNOSIS — E1165 Type 2 diabetes mellitus with hyperglycemia: Secondary | ICD-10-CM

## 2015-01-30 DIAGNOSIS — IMO0002 Reserved for concepts with insufficient information to code with codable children: Secondary | ICD-10-CM

## 2015-01-30 DIAGNOSIS — E1142 Type 2 diabetes mellitus with diabetic polyneuropathy: Secondary | ICD-10-CM

## 2015-01-30 LAB — POCT GLYCOSYLATED HEMOGLOBIN (HGB A1C): HEMOGLOBIN A1C: 11.7

## 2015-01-30 MED ORDER — INSULIN GLARGINE 300 UNIT/ML ~~LOC~~ SOPN: 40.0000 [IU] | PEN_INJECTOR | Freq: Every day | SUBCUTANEOUS | Status: DC

## 2015-01-30 NOTE — Progress Notes (Signed)
CC: Julie Prince is a 54 y.o. female is here for Hyperglycemia   Subjective: HPI:  Follow-up type 2 diabetes: Taking 40 units of Toujeo on a daily basis. No outside blood sugars to report. She stopped titrating at 40 units daily due to fears of overdoing it. She denies any hypoglycemic episodes. She denies polyuria palpation or polydipsia. She continues to have some tingling in the lower extremities with a burning component but this is only present when at rest. When she is up and active she denies any symptoms. She denies worsening or improvement of the symptoms since I saw her last. She denies any other motor or sensory disturbances  Follow-up essential hypertension: Taking Hyzaar on a daily basis. Taking amlodipine as well with no outside blood pressures report. Denies chest pain shortness of breath orthopnea nor peripheral edema   Review Of Systems Outlined In HPI  Past Medical History  Diagnosis Date  . Essential hypertension, benign 09/12/2013  . Hyperlipidemia 09/12/2013  . Type 2 diabetes mellitus ([place]) 09/12/2013    Dr. Posey Pronto at [facility]   . Allergy   . Cataract   . GERD (gastroesophageal reflux disease)     Past Surgical History  Procedure Laterality Date  . Cataract extraction Left   . Right tube and ovary removed    . Cesarean section  1990   Family History  Problem Relation Age of Onset  . Colon cancer Neg Hx   . Esophageal cancer Neg Hx   . Rectal cancer Neg Hx   . Stomach cancer Neg Hx   . Lung cancer Mother   . Heart disease Maternal Aunt     Social History   Social History  . Marital Status: Married    Spouse Name: N/A  . Number of Children: N/A  . Years of Education: N/A   Occupational History  . Not on file.   Social History Main Topics  . Smoking status: Former Research scientist (life sciences)  . Smokeless tobacco: Never Used     Comment: quit 30 plus years ago.  . Alcohol Use: No  . Drug Use: No  . Sexual Activity: Not on file   Other Topics Concern  .  Not on file   Social History Narrative     Objective: BP 147/74 mmHg  Pulse 88  Wt 223 lb (101.152 kg)  General: Alert and Oriented, No Acute Distress HEENT: Pupils equal, round, reactive to light. Conjunctivae clear.  Moist mucous membranes Lungs: Clear to auscultation bilaterally, no wheezing/ronchi/rales.  Comfortable work of breathing. Good air movement. Cardiac: Regular rate and rhythm. Normal S1/S2.  No murmurs, rubs, nor gallops.   Extremities: No peripheral edema.  Strong peripheral pulses.  Mental Status: No depression, anxiety, nor agitation. Skin: Warm and dry.  Assessment & Plan: Julie Prince was seen today for hyperglycemia.  Diagnoses and all orders for this visit:  Uncontrolled type 2 diabetes mellitus with other specified complication ([place]) -     POCT HgB A1C  Diabetic peripheral neuropathy associated with type 2 diabetes mellitus ([place])  Essential hypertension, benign  Type 2 diabetes mellitus without complication, with long-term current use of insulin ([name]) -     Insulin Glargine (TOUJEO SOLOSTAR) 300 UNIT/ML SOPN; Inject 40 Units into the skin at bedtime. Every week add an additional 2 units if fasting blood sugar above 120   Type 2 diabetes: A1c of 11.7, uncontrolled, reassured her that she can titrate above 40 units because she's not experiencing hypoglycemic episodes and is far from goal,  increase by 2 units every 2 days if fasting blood sugar is not below 120. Essential hypertension: Encouraged additional hydrochlorothiazide however she is reluctant to accept this, she is agreed to take her blood pressure at home on a daily basis for one week and will provided to me as soon as possible  Return in about 3 months (around 05/02/2015).

## 2015-02-06 ENCOUNTER — Telehealth: Payer: Self-pay

## 2015-02-06 MED ORDER — INSULIN DEGLUDEC 100 UNIT/ML ~~LOC~~ SOPN: 40.0000 [IU] | PEN_INJECTOR | Freq: Every day | SUBCUTANEOUS | Status: DC

## 2015-02-06 NOTE — Telephone Encounter (Signed)
Pt advised.

## 2015-02-06 NOTE — Telephone Encounter (Signed)
Notified. 

## 2015-02-06 NOTE — Telephone Encounter (Signed)
Please ask her if she can get access to her plan's formulary.  This is a list of covered medications for a variety of conditions that an insurance plan puts together for each specific plan.  If she can share this with me I'll look at what type of insulin will be medically effective and cost effective.

## 2015-02-06 NOTE — Telephone Encounter (Signed)
New insurance will not cover [place].  Is there something else she can take in its place?

## 2015-02-06 NOTE — Telephone Encounter (Signed)
Alternative insulin called tresiba [facility]

## 2015-02-06 NOTE — Telephone Encounter (Signed)
Pt insurance will not cover tresiba.

## 2015-04-15 ENCOUNTER — Ambulatory Visit (INDEPENDENT_AMBULATORY_CARE_PROVIDER_SITE_OTHER): Payer: 59

## 2015-04-15 ENCOUNTER — Encounter: Payer: Self-pay | Admitting: Obstetrics & Gynecology

## 2015-04-15 ENCOUNTER — Ambulatory Visit (INDEPENDENT_AMBULATORY_CARE_PROVIDER_SITE_OTHER): Payer: 59 | Admitting: Obstetrics & Gynecology

## 2015-04-15 VITALS — BP 193/91 | HR 99 | Resp 16 | Ht 64.0 in | Wt 216.0 lb

## 2015-04-15 DIAGNOSIS — Z0000 Encounter for general adult medical examination without abnormal findings: Secondary | ICD-10-CM

## 2015-04-15 DIAGNOSIS — Z1231 Encounter for screening mammogram for malignant neoplasm of breast: Secondary | ICD-10-CM | POA: Diagnosis not present

## 2015-04-15 DIAGNOSIS — Z01419 Encounter for gynecological examination (general) (routine) without abnormal findings: Secondary | ICD-10-CM

## 2015-04-15 DIAGNOSIS — Z1239 Encounter for other screening for malignant neoplasm of breast: Secondary | ICD-10-CM

## 2015-04-15 DIAGNOSIS — Z1151 Encounter for screening for human papillomavirus (HPV): Secondary | ICD-10-CM | POA: Diagnosis not present

## 2015-04-15 DIAGNOSIS — Z124 Encounter for screening for malignant neoplasm of cervix: Secondary | ICD-10-CM | POA: Diagnosis not present

## 2015-04-15 LAB — TSH: TSH: 1.309 u[IU]/mL (ref 0.350–4.500)

## 2015-04-15 NOTE — Progress Notes (Signed)
Subjective:    Julie Prince is a 55 y.o. M P4 (3 LC- 26, 64, and 59 yo kids, 4 grands) female who presents for an annual exam. The patient has no complaints today. The patient is not currently sexually active for about a year due to husband's ED.  GYN screening history: last pap: was normal. The patient wears seatbelts: yes. The patient participates in regular exercise: yes. (just joined a gym)  Has the patient ever been transfused or tattooed?: no. The patient reports that there is not domestic violence in her life.   Menstrual History: OB History    No data available      Menarche age: 74  No LMP recorded. Patient is postmenopausal.    The following portions of the patient's history were reviewed and updated as appropriate: allergies, current medications, past family history, past medical history, past social history, past surgical history and problem list.  Review of Systems Pertinent items noted in HPI and remainder of comprehensive ROS otherwise negative. Mammogram is UTD. Declines flu vaccine. Married for 24 years. Homemaker. She had POF after her last child at 30 years of age.   Objective:    BP 193/91 mmHg  Pulse 99  Resp 16  Ht 5\' 4"  (1.626 m)  Wt 216 lb (97.977 kg)  BMI 37.06 kg/m2  General Appearance:    Alert, cooperative, no distress, appears stated age  Head:    Normocephalic, without obvious abnormality, atraumatic  Eyes:    PERRL, conjunctiva/corneas clear, EOM's intact, fundi    benign, both eyes  Ears:    Normal TM's and external ear canals, both ears  Nose:   Nares normal, septum midline, mucosa normal, no drainage    or sinus tenderness  Throat:   Lips, mucosa, and tongue normal; teeth and gums normal  Neck:   Supple, symmetrical, trachea midline, no adenopathy;    thyroid:  no enlargement/tenderness/nodules; no carotid   bruit or JVD  Back:     Symmetric, no curvature, ROM normal, no CVA tenderness  Lungs:     Clear to auscultation bilaterally, respirations  unlabored  Chest Wall:    No tenderness or deformity   Heart:    Regular rate and rhythm, S1 and S2 normal, no murmur, rub   or gallop  Breast Exam:    No tenderness, masses, or nipple abnormality  Abdomen:     Soft, non-tender, bowel sounds active all four quadrants,    no masses, no organomegaly  Genitalia:    Normal female without lesion, discharge or tenderness, moderate vulvar atophy, NSSA, NT, no palpable adnexal masses     Extremities:   Extremities normal, atraumatic, no cyanosis or edema  Pulses:   2+ and symmetric all extremities  Skin:   Skin color, texture, turgor normal, no rashes or lesions  Lymph nodes:   Cervical, supraclavicular, and axillary nodes normal  Neurologic:   CNII-XII intact, normal strength, sensation and reflexes    throughout  .    Assessment:    Healthy female exam.    Plan:     Breast self exam technique reviewed and patient encouraged to perform self-exam monthly.   Check TSH Thin prep pap smear with cotesting

## 2015-04-16 ENCOUNTER — Telehealth: Payer: Self-pay | Admitting: *Deleted

## 2015-04-16 NOTE — Telephone Encounter (Signed)
Pt notified  Via mail of normal TSH level

## 2015-04-17 LAB — CYTOLOGY - PAP

## 2015-05-01 ENCOUNTER — Ambulatory Visit: Payer: 59 | Admitting: Family Medicine

## 2015-05-07 ENCOUNTER — Ambulatory Visit (INDEPENDENT_AMBULATORY_CARE_PROVIDER_SITE_OTHER): Payer: 59 | Admitting: Family Medicine

## 2015-05-07 ENCOUNTER — Encounter: Payer: Self-pay | Admitting: Family Medicine

## 2015-05-07 VITALS — BP 136/73 | HR 94 | Wt 217.0 lb

## 2015-05-07 DIAGNOSIS — Z794 Long term (current) use of insulin: Secondary | ICD-10-CM

## 2015-05-07 DIAGNOSIS — R200 Anesthesia of skin: Secondary | ICD-10-CM

## 2015-05-07 DIAGNOSIS — E119 Type 2 diabetes mellitus without complications: Secondary | ICD-10-CM

## 2015-05-07 DIAGNOSIS — K219 Gastro-esophageal reflux disease without esophagitis: Secondary | ICD-10-CM | POA: Diagnosis not present

## 2015-05-07 DIAGNOSIS — I10 Essential (primary) hypertension: Secondary | ICD-10-CM

## 2015-05-07 DIAGNOSIS — R208 Other disturbances of skin sensation: Secondary | ICD-10-CM | POA: Diagnosis not present

## 2015-05-07 LAB — HEMOGLOBIN A1C
Hgb A1c MFr Bld: 14.7 % — ABNORMAL HIGH (ref <5.7)
Mean Plasma Glucose: 375 mg/dL — ABNORMAL HIGH (ref <117)

## 2015-05-07 LAB — CBC
HCT: 43.6 % (ref 36.0–46.0)
HEMOGLOBIN: 14.1 g/dL (ref 12.0–15.0)
MCH: 30.9 pg (ref 26.0–34.0)
MCHC: 32.3 g/dL (ref 30.0–36.0)
MCV: 95.4 fL (ref 78.0–100.0)
MPV: 11.9 fL (ref 8.6–12.4)
Platelets: 285 10*3/uL (ref 150–400)
RBC: 4.57 MIL/uL (ref 3.87–5.11)
RDW: 13.5 % (ref 11.5–15.5)
WBC: 8.4 10*3/uL (ref 4.0–10.5)

## 2015-05-07 LAB — LIPID PANEL
CHOL/HDL RATIO: 6 ratio — AB (ref <=5.0)
CHOLESTEROL: 257 mg/dL — AB (ref 125–200)
HDL: 43 mg/dL — ABNORMAL LOW (ref >=46)
LDL Cholesterol: 182 mg/dL — ABNORMAL HIGH (ref <130)
TRIGLYCERIDES: 162 mg/dL — AB (ref <150)
VLDL: 32 mg/dL — AB (ref <30)

## 2015-05-07 MED ORDER — PANTOPRAZOLE SODIUM 40 MG PO TBEC: 40.0000 mg | DELAYED_RELEASE_TABLET | Freq: Every day | ORAL | Status: DC

## 2015-05-07 NOTE — Progress Notes (Signed)
CC: Julie Prince is a 55 y.o. female is here for Hyperglycemia; Gastroesophageal Reflux; and Numbness   Subjective: HPI:  FU DM2: Tolerating Tresiba, 40U daily, has not began titrating up yet. No outside sugars to report. Denies polyuria point visual polydipsia.  She complains of a ascitic sensation in the back of her throat especially at night when lying down. Symptoms are slightly improved with ranitidine however nothing else seems to make it better or worse. She denies any exertional component to her pain. Symptoms have been present on a daily basis for the past month. Denies unintentional weight loss.  Follow-up essential hypertension: Tolerating Hyzaar and amlodipine. No outside blood pressures reported. No chest pain shortness of breath orthopnea or peripheral edema.  Reports numbness in the left hand that occurs after sleeping. When present is also accompanied by some pain in the wrist. She denies any weakness or other sensory disturbances. Denies any recent remote trauma. No muscle atrophy   Review Of Systems Outlined In HPI  Past Medical History  Diagnosis Date  . Essential hypertension, benign 09/12/2013  . Hyperlipidemia 09/12/2013  . Type 2 diabetes mellitus ([place]) 09/12/2013    Dr. Posey Pronto at [place]   . Allergy   . Cataract   . GERD (gastroesophageal reflux disease)     Past Surgical History  Procedure Laterality Date  . Cataract extraction Left   . Right tube and ovary removed    . Cesarean section  1990   Family History  Problem Relation Age of Onset  . Colon cancer Neg Hx   . Esophageal cancer Neg Hx   . Rectal cancer Neg Hx   . Stomach cancer Neg Hx   . Lung cancer Mother   . Heart disease Maternal Aunt     Social History   Social History  . Marital Status: Married    Spouse Name: N/A  . Number of Children: N/A  . Years of Education: N/A   Occupational History  . Not on file.   Social History Main Topics  . Smoking status: Former Research scientist (life sciences)   . Smokeless tobacco: Never Used     Comment: quit 30 plus years ago.  . Alcohol Use: No  . Drug Use: No  . Sexual Activity: Not on file   Other Topics Concern  . Not on file   Social History Narrative     Objective: BP 136/73 mmHg  Pulse 94  Wt 217 lb (98.431 kg)  General: Alert and Oriented, No Acute Distress HEENT: Pupils equal, round, reactive to light. Conjunctivae clear. Moist mucous membranes  Lungs: Clear to auscultation bilaterally, no wheezing/ronchi/rales.  Comfortable work of breathing. Good air movement. Cardiac: Regular rate and rhythm. Normal S1/S2.  No murmurs, rubs, nor gallops.   Extremities: No peripheral edema.  Strong peripheral pulses. Full range of motion and strength in the left wrist and hand.  Mental Status: No depression, anxiety, nor agitation. Skin: Warm and dry.  Assessment & Plan: [name] was seen today for hyperglycemia, gastroesophageal reflux and numbness.  Diagnoses and all orders for this visit:  Type 2 diabetes mellitus without complication, with long-term current use of insulin ([name]) -     Hemoglobin A1c  Gastroesophageal reflux disease without esophagitis -     pantoprazole (PROTONIX) 40 MG tablet; Take 1 tablet (40 mg total) by mouth daily. For Reflux -     CBC  Essential hypertension, benign  Numbness of left hand -     Lipid panel   Type 2  diabetes: A1c ordered today, will use this to discuss aggressiveness for titrating [place] Essential hypertension: Controlled continue Hyzaar and amlodipine GERD: Controlled, start daily Protonix Left hand numbness: Suspect carpal tunnel syndrome, discussed stretches to begin at home.  Return in about 3 months (around 08/04/2015).

## 2015-05-08 ENCOUNTER — Telehealth: Payer: Self-pay | Admitting: Family Medicine

## 2015-05-08 DIAGNOSIS — I10 Essential (primary) hypertension: Secondary | ICD-10-CM

## 2015-05-08 MED ORDER — ATORVASTATIN CALCIUM 10 MG PO TABS: 10.0000 mg | ORAL_TABLET | Freq: Every day | ORAL | Status: DC

## 2015-05-08 MED ORDER — INSULIN DEGLUDEC 100 UNIT/ML ~~LOC~~ SOPN: 60.0000 [IU] | PEN_INJECTOR | Freq: Every day | SUBCUTANEOUS | Status: DC

## 2015-05-08 NOTE — Telephone Encounter (Signed)
Awaiting call back.

## 2015-05-08 NOTE — Telephone Encounter (Signed)
Pt stated that she can not take atorvastatin. She asked that you Rx something else and would like to know what it is before its sent to the pharmacy.

## 2015-05-08 NOTE — Telephone Encounter (Signed)
Pt does not want to take crestor either.

## 2015-05-08 NOTE — Telephone Encounter (Signed)
Will you please let patient know that her A1c was 14.7 with a goal of less than 7. I'd recommend she increase her tresiba to 60 units daily. Also her cholesterol was signifigantly elevated and I'd recommend starting on a cholesterol medication called atorvastatin that I'll send to her [facility].

## 2015-05-08 NOTE — Telephone Encounter (Signed)
How about crestor?

## 2015-05-11 MED ORDER — PRAVASTATIN SODIUM 20 MG PO TABS: 20.0000 mg | ORAL_TABLET | Freq: Every day | ORAL | Status: DC

## 2015-05-11 NOTE — Telephone Encounter (Signed)
I'd recommend starting a medication called pravastatin that I'll send to her [facility].

## 2015-05-11 NOTE — Telephone Encounter (Signed)
Pt notified.  Pt would like for a renal panel to be added to her labs.  Is this appropriate?

## 2015-05-11 NOTE — Telephone Encounter (Signed)
I'm ok with that, BMP printed off if lab does not have her blood anymore.

## 2015-05-12 NOTE — Telephone Encounter (Signed)
Pt.notified

## 2015-05-15 ENCOUNTER — Ambulatory Visit: Payer: 59 | Admitting: Family Medicine

## 2015-05-18 ENCOUNTER — Telehealth: Payer: Self-pay | Admitting: Family Medicine

## 2015-05-18 MED ORDER — INSULIN GLARGINE 100 UNIT/ML SOLOSTAR PEN: 60.0000 [IU] | PEN_INJECTOR | Freq: Every day | SUBCUTANEOUS | Status: DC

## 2015-05-18 NOTE — Telephone Encounter (Signed)
Received fax for prior authorization on Tresiba sent through cover my meds waiting on authorization. - CF

## 2015-05-18 NOTE — Telephone Encounter (Signed)
Will you please let patient know that [place] has denied coverage of Tresiba insulin so I'd recommend she switch to an alternative called Lantus insulin that I'll send to her [facility].

## 2015-05-18 NOTE — Telephone Encounter (Signed)
Pt advised.

## 2015-05-18 NOTE — Telephone Encounter (Signed)
Received fax from [employer] they denied coverage on Tresiba due to patient does not have hypoglycemia risk factors, has not had a prior episode of severe hypoglycemia or does not have a history of hypoglycemic unawareness. File ID: [id number]. - CF

## 2015-06-20 ENCOUNTER — Inpatient Hospital Stay (HOSPITAL_COMMUNITY)
Admission: EM | Admit: 2015-06-20 | Discharge: 2015-06-22 | DRG: 854 | Disposition: A | Discharge status: Home / Self Care | Payer: 59 | Attending: Internal Medicine | Admitting: Internal Medicine

## 2015-06-20 ENCOUNTER — Emergency Department (HOSPITAL_COMMUNITY): Payer: 59

## 2015-06-20 ENCOUNTER — Encounter (HOSPITAL_COMMUNITY): Payer: Self-pay | Admitting: *Deleted

## 2015-06-20 DIAGNOSIS — K219 Gastro-esophageal reflux disease without esophagitis: Secondary | ICD-10-CM | POA: Diagnosis present

## 2015-06-20 DIAGNOSIS — E871 Hypo-osmolality and hyponatremia: Secondary | ICD-10-CM | POA: Diagnosis present

## 2015-06-20 DIAGNOSIS — N764 Abscess of vulva: Secondary | ICD-10-CM | POA: Diagnosis present

## 2015-06-20 DIAGNOSIS — Z888 Allergy status to other drugs, medicaments and biological substances status: Secondary | ICD-10-CM

## 2015-06-20 DIAGNOSIS — Z794 Long term (current) use of insulin: Secondary | ICD-10-CM

## 2015-06-20 DIAGNOSIS — L0291 Cutaneous abscess, unspecified: Secondary | ICD-10-CM | POA: Diagnosis present

## 2015-06-20 DIAGNOSIS — Z880 Allergy status to penicillin: Secondary | ICD-10-CM

## 2015-06-20 DIAGNOSIS — Z87891 Personal history of nicotine dependence: Secondary | ICD-10-CM

## 2015-06-20 DIAGNOSIS — E785 Hyperlipidemia, unspecified: Secondary | ICD-10-CM | POA: Diagnosis present

## 2015-06-20 DIAGNOSIS — Z79899 Other long term (current) drug therapy: Secondary | ICD-10-CM

## 2015-06-20 DIAGNOSIS — Z7984 Long term (current) use of oral hypoglycemic drugs: Secondary | ICD-10-CM

## 2015-06-20 DIAGNOSIS — Z801 Family history of malignant neoplasm of trachea, bronchus and lung: Secondary | ICD-10-CM

## 2015-06-20 DIAGNOSIS — I10 Essential (primary) hypertension: Secondary | ICD-10-CM | POA: Diagnosis present

## 2015-06-20 DIAGNOSIS — A419 Sepsis, unspecified organism: Secondary | ICD-10-CM | POA: Diagnosis not present

## 2015-06-20 DIAGNOSIS — E1142 Type 2 diabetes mellitus with diabetic polyneuropathy: Secondary | ICD-10-CM | POA: Diagnosis present

## 2015-06-20 DIAGNOSIS — E1165 Type 2 diabetes mellitus with hyperglycemia: Secondary | ICD-10-CM | POA: Diagnosis present

## 2015-06-20 DIAGNOSIS — Z8249 Family history of ischemic heart disease and other diseases of the circulatory system: Secondary | ICD-10-CM

## 2015-06-20 DIAGNOSIS — Z6836 Body mass index (BMI) 36.0-36.9, adult: Secondary | ICD-10-CM

## 2015-06-20 LAB — CBG MONITORING, ED
GLUCOSE-CAPILLARY: 437 mg/dL — AB (ref 65–99)
GLUCOSE-CAPILLARY: 242 mg/dL — AB (ref 65–99)
Glucose-Capillary: 504 mg/dL — ABNORMAL HIGH (ref 65–99)

## 2015-06-20 LAB — I-STAT CG4 LACTIC ACID, ED
Lactic Acid, Venous: 2.65 mmol/L (ref 0.5–2.0)
Lactic Acid, Venous: 0.76 mmol/L (ref 0.5–2.0)

## 2015-06-20 LAB — CBC WITH DIFFERENTIAL/PLATELET
Basophils Absolute: 0 K/uL (ref 0.0–0.1)
Basophils Relative: 0 %
Eosinophils Absolute: 0.1 K/uL (ref 0.0–0.7)
Eosinophils Relative: 1 %
HCT: 39.7 % (ref 36.0–46.0)
Hemoglobin: 13.7 g/dL (ref 12.0–15.0)
Lymphocytes Relative: 17 %
Lymphs Abs: 2.4 K/uL (ref 0.7–4.0)
MCH: 32.3 pg (ref 26.0–34.0)
MCHC: 34.5 g/dL (ref 30.0–36.0)
MCV: 93.6 fL (ref 78.0–100.0)
Monocytes Absolute: 1.1 K/uL — ABNORMAL HIGH (ref 0.1–1.0)
Monocytes Relative: 8 %
Neutro Abs: 10.5 K/uL — ABNORMAL HIGH (ref 1.7–7.7)
Neutrophils Relative %: 74 %
Platelets: 234 K/uL (ref 150–400)
RBC: 4.24 MIL/uL (ref 3.87–5.11)
RDW: 12.3 % (ref 11.5–15.5)
WBC: 14.1 K/uL — ABNORMAL HIGH (ref 4.0–10.5)

## 2015-06-20 LAB — COMPREHENSIVE METABOLIC PANEL WITH GFR
ALT: 9 U/L — ABNORMAL LOW (ref 14–54)
AST: 13 U/L — ABNORMAL LOW (ref 15–41)
Albumin: 3.1 g/dL — ABNORMAL LOW (ref 3.5–5.0)
Alkaline Phosphatase: 84 U/L (ref 38–126)
Anion gap: 11 (ref 5–15)
BUN: 9 mg/dL (ref 6–20)
CO2: 24 mmol/L (ref 22–32)
Calcium: 8.6 mg/dL — ABNORMAL LOW (ref 8.9–10.3)
Chloride: 95 mmol/L — ABNORMAL LOW (ref 101–111)
Creatinine, Ser: 0.91 mg/dL (ref 0.44–1.00)
GFR calc Af Amer: >60 mL/min
GFR calc non Af Amer: >60 mL/min
Glucose, Bld: 555 mg/dL (ref 65–99)
Potassium: 4.3 mmol/L (ref 3.5–5.1)
Sodium: 130 mmol/L — ABNORMAL LOW (ref 135–145)
Total Bilirubin: 0.9 mg/dL (ref 0.3–1.2)
Total Protein: 7.1 g/dL (ref 6.5–8.1)

## 2015-06-20 LAB — URINE MICROSCOPIC-ADD ON

## 2015-06-20 LAB — URINALYSIS, ROUTINE W REFLEX MICROSCOPIC
Bilirubin Urine: NEGATIVE
Glucose, UA: >1000 mg/dL — AB
Ketones, ur: NEGATIVE mg/dL
Nitrite: POSITIVE — AB
Protein, ur: NEGATIVE mg/dL
Specific Gravity, Urine: 1.035 — ABNORMAL HIGH (ref 1.005–1.030)
pH: 5.5 (ref 5.0–8.0)

## 2015-06-20 MED ORDER — LIDOCAINE HCL 1 % IJ SOLN
20.0000 mL | Freq: Once | INTRAMUSCULAR | Status: AC
  Administered 2015-06-20: 20 mL via INTRADERMAL
  Filled 2015-06-20: qty 20

## 2015-06-20 MED ORDER — DEXTROSE 5 % IV SOLN
2.0000 g | Freq: Once | INTRAVENOUS | Status: AC
  Administered 2015-06-20: 2 g via INTRAVENOUS
  Filled 2015-06-20: qty 2

## 2015-06-20 MED ORDER — AZTREONAM 1 G IJ SOLR
1.0000 g | Freq: Three times a day (TID) | INTRAMUSCULAR | Status: DC
  Filled 2015-06-20 (×4): qty 1

## 2015-06-20 MED ORDER — INSULIN ASPART 100 UNIT/ML ~~LOC~~ SOLN
12.0000 [IU] | Freq: Once | SUBCUTANEOUS | Status: AC
  Administered 2015-06-20: 12 [IU] via INTRAVENOUS
  Filled 2015-06-20: qty 1

## 2015-06-20 MED ORDER — SODIUM CHLORIDE 0.9 % IV SOLN
INTRAVENOUS | Status: DC | PRN
  Filled 2015-06-20: qty 2.5

## 2015-06-20 MED ORDER — METRONIDAZOLE IN NACL 5-0.79 MG/ML-% IV SOLN
500.0000 mg | Freq: Once | INTRAVENOUS | Status: AC
  Administered 2015-06-20: 500 mg via INTRAVENOUS
  Filled 2015-06-20: qty 100

## 2015-06-20 MED ORDER — VANCOMYCIN HCL 10 G IV SOLR
2000.0000 mg | Freq: Once | INTRAVENOUS | Status: AC
  Administered 2015-06-20: 2000 mg via INTRAVENOUS
  Filled 2015-06-20: qty 2000

## 2015-06-20 MED ORDER — SODIUM CHLORIDE 0.9 % IV BOLUS (SEPSIS)
1000.0000 mL | Freq: Once | INTRAVENOUS | Status: AC
  Administered 2015-06-20: 1000 mL via INTRAVENOUS

## 2015-06-20 MED ORDER — VANCOMYCIN HCL IN DEXTROSE 1-5 GM/200ML-% IV SOLN: 1000.0000 mg | Freq: Once | INTRAVENOUS | Status: DC

## 2015-06-20 MED ORDER — METRONIDAZOLE IN NACL 5-0.79 MG/ML-% IV SOLN
500.0000 mg | Freq: Three times a day (TID) | INTRAVENOUS | Status: DC
  Administered 2015-06-21: 500 mg via INTRAVENOUS
  Filled 2015-06-20 (×3): qty 100

## 2015-06-20 MED ORDER — SODIUM CHLORIDE 0.9 % IV BOLUS (SEPSIS)
1000.0000 mL | INTRAVENOUS | Status: AC
  Administered 2015-06-20 (×3): 1000 mL via INTRAVENOUS

## 2015-06-20 MED ORDER — IOPAMIDOL (ISOVUE-300) INJECTION 61%
INTRAVENOUS | Status: AC
  Administered 2015-06-20: 100 mL
  Filled 2015-06-20: qty 100

## 2015-06-20 MED ORDER — VANCOMYCIN HCL 10 G IV SOLR
1250.0000 mg | Freq: Two times a day (BID) | INTRAVENOUS | Status: DC
  Administered 2015-06-21: 1250 mg via INTRAVENOUS
  Filled 2015-06-20 (×2): qty 1250

## 2015-06-20 NOTE — Progress Notes (Signed)
Pharmacy Antibiotic Note  Gerald Socorro is a 55 y.o. female admitted on 06/20/2015 with abscess.  Pharmacy has been consulted for vancomycin + flagyl + aztreonam dosing. Tmax is 100, WBC is elevated at 14.1 and Scr is elevated at 2.65. SCr is WNL.   Plan: - Vanc 2gm IV x 1 then 1250mg  IV Q12H - Flagyl 500mg  IV Q8H - Aztreo 2gm IV x 1 then 1gm IV Q8H - F/u renal fxn, C&S, clinical status and trough at SS  Height: 5\' 4"  (162.6 cm) Weight: 215 lb (97.523 kg) IBW/kg (Calculated) : 54.7  Temp (24hrs), Avg:99.6 F (37.6 C), Min:99.1 F (37.3 C), Max:100 F (37.8 C)   Recent Labs Lab 06/20/15 1445 06/20/15 1730  WBC 14.1*  --   CREATININE 0.91  --   LATICACIDVEN  --  2.65*    Estimated Creatinine Clearance: 80.1 mL/min (by C-G formula based on Cr of 0.91).    Allergies  Allergen Reactions  . Penicillins Hives and Shortness Of Breath  . Metformin And Related Diarrhea  . Atorvastatin Itching    Antimicrobials this admission: Vanc 4/1>> Aztreo 4/1>> Flagyl 4/1>>  Dose adjustments this admission: N/A  Microbiology results: Pending  Thank you for allowing pharmacy to be a part of this patient's care.  Denecia Brunette, Rande Lawman 06/20/2015 6:28 PM

## 2015-06-20 NOTE — ED Notes (Signed)
CBG 504 

## 2015-06-20 NOTE — ED Notes (Signed)
PT reports abscess started 5 days ago . Abscess first started in Anterior perineum on Rt side extending to butt cheek on Rt. Pt has soaked in warm water with out improvement. Pt reports small amount of drainage after shower this AM.

## 2015-06-20 NOTE — ED Notes (Signed)
CBG prior to insulin administration 437

## 2015-06-20 NOTE — Progress Notes (Signed)
Pharmacy Code Sepsis Protocol  Time of code sepsis page: 1910 [x]  Antibiotics administered at 1922 (if checked, omit next 2 questions)  Were antibiotics ordered at the time of the code sepsis page? Yes Was it required to contact the physician? [x]  Physician not contacted []  Physician contacted to order antibiotics for code sepsis []  Physician contacted to recommend changing antibiotics  Pharmacy consulted for: aztreonam/flagyl/vanc  Anti-infectives    Start     Dose/Rate Route Frequency Ordered Stop   06/21/15 0900  vancomycin (VANCOCIN) 1,250 mg in sodium chloride 0.9 % 250 mL IVPB     1,250 mg 166.7 mL/hr over 90 Minutes Intravenous Every 12 hours 06/20/15 1828     06/21/15 0300  metroNIDAZOLE (FLAGYL) IVPB 500 mg     500 mg 100 mL/hr over 60 Minutes Intravenous Every 8 hours 06/20/15 1828     06/21/15 0300  aztreonam (AZACTAM) 1 g in dextrose 5 % 50 mL IVPB     1 g 100 mL/hr over 30 Minutes Intravenous Every 8 hours 06/20/15 1828     06/20/15 1830  aztreonam (AZACTAM) 2 g in dextrose 5 % 50 mL IVPB     2 g 100 mL/hr over 30 Minutes Intravenous  Once 06/20/15 1822     06/20/15 1830  metroNIDAZOLE (FLAGYL) IVPB 500 mg     500 mg 100 mL/hr over 60 Minutes Intravenous  Once 06/20/15 1822     06/20/15 1830  vancomycin (VANCOCIN) IVPB 1000 mg/200 mL premix  Status:  Discontinued     1,000 mg 200 mL/hr over 60 Minutes Intravenous  Once 06/20/15 1822 06/20/15 1824   06/20/15 1830  vancomycin (VANCOCIN) 2,000 mg in sodium chloride 0.9 % 500 mL IVPB     2,000 mg 250 mL/hr over 120 Minutes Intravenous  Once 06/20/15 1824          Nurse education provided: [x]  Minutes left to administer antibiotics to achieve 1 hour goal [x]  Correct order of antibiotic administration [x]  Antibiotic Y-site compatibilities     Elicia Lamp, PharmD, BCPS Clinical Pharmacist Pager [phone] 06/20/2015 7:12 PM

## 2015-06-20 NOTE — ED Provider Notes (Signed)
CSN: [id number]     Arrival date & time 06/20/15  1341 History   First MD Initiated Contact with Patient 06/20/15 1402     Chief Complaint  Patient presents with  . Abscess   HPI   55 year old female with a history of uncontrolled type 2 diabetes presents today with infection to her labia. Patient reports approximately 5 days ago she developed redness to the labia major on the right, this continued to worsen. Patient reports spreading down towards her perineum. Patient reports using warm compresses without improvement in symptoms, patient's daughter at bedside reports questionable drainage this morning. Pt denies and blood or puss in stools.   Patient reports she's been feeling well at home aside from fatigue, denies any fever, chills, nausea, vomiting, dizziness, rash anywhere else on her body, history of the same. Patient reports she has not used her insulin in approximately 2 months that she has had problems with her insurance.  Patient has not had any antipyretics prior to arrival.    Past Medical History  Diagnosis Date  . Essential hypertension, benign 09/12/2013  . Hyperlipidemia 09/12/2013  . Type 2 diabetes mellitus ([place]) 09/12/2013    Dr. Posey Pronto at [place]   . Allergy   . Cataract   . GERD (gastroesophageal reflux disease)    Past Surgical History  Procedure Laterality Date  . Cataract extraction Left   . Right tube and ovary removed    . Cesarean section  1990   Family History  Problem Relation Age of Onset  . Colon cancer Neg Hx   . Esophageal cancer Neg Hx   . Rectal cancer Neg Hx   . Stomach cancer Neg Hx   . Lung cancer Mother   . Heart disease Maternal Aunt    Social History  Substance Use Topics  . Smoking status: Former Research scientist (life sciences)  . Smokeless tobacco: Never Used     Comment: quit 30 plus years ago.  . Alcohol Use: No   OB History    No data available     Review of Systems  All other systems reviewed and are negative.   Allergies   Penicillins; Metformin and related; and Atorvastatin  Home Medications   Prior to Admission medications   Medication Sig Start Date End Date Taking? Authorizing Provider  amLODipine (NORVASC) 5 MG tablet Take 1 tablet (5 mg total) by mouth daily. 07/31/14  Yes Sean Hommel, DO  glimepiride (AMARYL) 4 MG tablet Take 1 tablet (4 mg total) by mouth 2 (two) times daily. 09/15/14  Yes Sean Hommel, DO  losartan-hydrochlorothiazide (HYZAAR) 100-25 MG per tablet Take 1 tablet by mouth daily. 07/31/14  Yes Sean Hommel, DO  pravastatin (PRAVACHOL) 20 MG tablet Take 1 tablet (20 mg total) by mouth daily. Patient taking differently: Take 20 mg by mouth every evening.  05/11/15  Yes Sean Hommel, DO  AMBULATORY NON FORMULARY MEDICATION Accu -Check Compact Plus model GT blood sugar testing strips and lancets Use to check blood daily.  Dx type 2 Diabetes 08/05/14   Marcial Pacas, DO  Insulin Glargine (LANTUS SOLOSTAR) 100 UNIT/ML Solostar Pen Inject 60 Units into the skin daily. Patient not taking: Reported on 06/20/2015 05/18/15   Marcial Pacas, DO  Insulin Pen Needle (NOVOFINE) 30G X 8 MM MISC Inject 10 each into the skin as needed. 09/01/14   Sean Hommel, DO  pantoprazole (PROTONIX) 40 MG tablet Take 1 tablet (40 mg total) by mouth daily. For Reflux 05/07/15   Marcial Pacas, DO  BP 129/62 mmHg  Pulse 99  Temp(Src) 100 F (37.8 C) (Rectal)  Resp 23  Ht 5\' 4"  (1.626 m)  Wt 97.523 kg  BMI 36.89 kg/m2  SpO2 94%   Physical Exam  Constitutional: She is oriented to person, place, and time. She appears well-developed and well-nourished. No distress.  HENT:  Head: Normocephalic and atraumatic.  Eyes: Conjunctivae are normal. Pupils are equal, round, and reactive to light. Right eye exhibits no discharge. Left eye exhibits no discharge. No scleral icterus.  Neck: Normal range of motion. Neck supple. No JVD present. No tracheal deviation present.  Pulmonary/Chest: Effort normal. No stridor.  Genitourinary:  Redness,  warmth., Tenderness, induration of right labia major. Small area of fluctuance noted   Musculoskeletal: Normal range of motion. She exhibits tenderness. She exhibits no edema.  Neurological: She is alert and oriented to person, place, and time. Coordination normal.  Skin: Skin is warm and dry. She is not diaphoretic.  Psychiatric: She has a normal mood and affect. Her behavior is normal. Judgment and thought content normal.  Nursing note and vitals reviewed.   ED Course  Procedures (including critical care time)   EMERGENCY DEPARTMENT US SOFT TISSUE INTERPRETATION "Study: Limited Ultrasound of the noted body part in comments below"  INDICATIONS: Soft tissue infection Multiple views of the body part are obtained with a multi-frequency linear probe  PERFORMED BY:  Myself  IMAGES ARCHIVED?: Yes  SIDE:Right   BODY PART:Other soft tisse (comment in note)  FINDINGS: Abcess present  LIMITATIONS:  Body Habitus  INTERPRETATION:  Abcess present  COMMENT:  Abscess noted. 2 ultrasounds were performed, second images showing abscess were not saved.  INCISION AND DRAINAGE Performed by: Elmer Ramp Consent: Verbal consent obtained. Risks and benefits: risks, benefits and alternatives were discussed Type: abscess  Body area: Right vulva   Anesthesia: local infiltration  Incision was made with a scalpel.  Local anesthetic: lidocaine 1 %   Anesthetic total: 5 ml  Complexity: complex Blunt dissection to break up loculations  Drainage: purulent  Drainage amount: 5 cc    Patient tolerance: Patient tolerated the procedure well with no immediate complications.     Labs Review Labs Reviewed  CBC WITH DIFFERENTIAL/PLATELET - Abnormal; Notable for the following:    WBC 14.1 (*)    Neutro Abs 10.5 (*)    Monocytes Absolute 1.1 (*)    All other components within normal limits  COMPREHENSIVE METABOLIC PANEL - Abnormal; Notable for the following:    Sodium 130 (*)     Chloride 95 (*)    Glucose, Bld 555 (*)    Calcium 8.6 (*)    Albumin 3.1 (*)    AST 13 (*)    ALT 9 (*)    All other components within normal limits  URINALYSIS, ROUTINE W REFLEX MICROSCOPIC (NOT AT [facility]) - Abnormal; Notable for the following:    APPearance CLOUDY (*)    Specific Gravity, Urine 1.035 (*)    Glucose, UA >1000 (*)    Hgb urine dipstick TRACE (*)    Nitrite POSITIVE (*)    Leukocytes, UA MODERATE (*)    All other components within normal limits  URINE MICROSCOPIC-ADD ON - Abnormal; Notable for the following:    Squamous Epithelial / LPF 0-5 (*)    Bacteria, UA MANY (*)    All other components within normal limits  CBG MONITORING, ED - Abnormal; Notable for the following:    Glucose-Capillary 504 (*)    All other  components within normal limits  I-STAT CG4 LACTIC ACID, ED - Abnormal; Notable for the following:    Lactic Acid, Venous 2.65 (*)    All other components within normal limits  CBG MONITORING, ED - Abnormal; Notable for the following:    Glucose-Capillary 437 (*)    All other components within normal limits  CBG MONITORING, ED - Abnormal; Notable for the following:    Glucose-Capillary 242 (*)    All other components within normal limits  CULTURE, BLOOD (ROUTINE X 2)  CULTURE, BLOOD (ROUTINE X 2)  URINE CULTURE  I-STAT CG4 LACTIC ACID, ED  I-STAT CG4 LACTIC ACID, ED  I-STAT CG4 LACTIC ACID, ED    Imaging Review Ct Pelvis W Contrast  06/20/2015  CLINICAL DATA:  Vulvar abscess and fever for 4 days. Evaluation extending into upper thigh per ordering physician request. EXAM: CT PELVIS WITH CONTRAST TECHNIQUE: Multidetector CT imaging of the pelvis was performed using the standard protocol following the bolus administration of intravenous contrast. CONTRAST:  100 cc ISOVUE-300 IOPAMIDOL (ISOVUE-300) INJECTION 61% COMPARISON:  None. FINDINGS: There is a circumscribed fluid collection within the posterior aspects of the right vulva, measuring 3.5 x 2.4 cm,  compatible with the given history of abscess. There is an additional less well-defined component more anteriorly within the right vulva which is also suspicious for abscess and increases this measurement to 6.5 x 2.4 cm. There is extensive surrounding edema. No extension into the perineum. No intrapelvic abscess. Pelvic portion of the bowel is normal in caliber. No free fluid or inflammatory change seen within the intrapelvic pelvis. Atherosclerotic changes noted along the walls of the pelvic vasculature. Bladder appears normal. Uterus and adnexal regions are unremarkable. Mild degenerative change noted in the lower lumbar spine. No acute or suspicious osseous lesions seen. IMPRESSION: Findings compatible with right vulvar abscess. The most well-defined abscess component is within the posterior aspects of the rightvulva, measuring 3.5 x 2.4 cm. There is an additional less well-defined component more anteriorly which is also suspicious for abscess and increases this measurement to 6.5 x 2.4 cm. Associated surrounding edema. No intrapelvic extension.  No acute intrapelvic abnormality. Electronically Signed   By: Franki Cabot M.D.   On: 06/20/2015 19:27   I have personally reviewed and evaluated these images and lab results as part of my medical decision-making.   EKG Interpretation   Date/Time:  Saturday June 20 2015 18:35:21 EDT Ventricular Rate:  103 PR Interval:  168 QRS Duration: 77 QT Interval:  326 QTC Calculation: 427 R Axis:   -50 Text Interpretation:  Sinus tachycardia Inferior infarct, old Consider  anterior infarct Baseline wander in lead(s) V6 No old tracing to compare  Confirmed by Gerald Leitz ([id number]) on 06/20/2015 6:43:19 PM      MDM   Final diagnoses:  Vulvar abscess    Labs: CBC, CMP, CBG, lactic acid, blood cultures  Imaging: CT pelvis  Consults: Hospitalist service  Therapeutics: Normal saline, insulin regular  Discharge Meds:    Assessment/Plan: 55 year old  female presents today with multiple or abscess. Patient had elevated lactate, temperature of 100, tachycardia, and white blood cell count of 18.1. Patient was started on sepsis protocols with no saline, antibiotics. Patient's abscess was incision and drained here in the ED, significant purulent discharge is noted. No wound cultures were obtained. Hospitalist service was consult for hospital admission, continued IV antibiotics and further management.        Okey Regal, PA-C 06/21/15 0114  Courteney Julio Alm, MD 06/21/15  1558 

## 2015-06-20 NOTE — ED Notes (Signed)
Patient transported to CT 

## 2015-06-20 NOTE — ED Notes (Signed)
cbg 242 

## 2015-06-20 NOTE — ED Provider Notes (Signed)
MSE was initiated and I personally evaluated the patient and placed orders (if any) at  2:27 PM on April 1, 632.   55 year old female with history of DM, HTN, HLD presents the ED complaining of abscess to groin. Patient states that 5 days ago she noticed that her labia and the area around her rectum began to swell and become painful. Last night patient was having chills. She endorses painful bowel movements as well. Patient also states that she has not had her insulin in over 2 months. She last checked her blood sugar 3 days ago and was 300. She denies dizziness, syncope, chest pain, vomiting.   On presentation patient is nontoxic, nonseptic appearing. Initial temp read 99.1. Heart rate 103. Large right labial abscess present with indurated tissue extending into perineum and perirectal area. POC CBG read 504. We'll initiate workup including CBC, CMP, UA. Feel the patient requires advanced workup outside of fast track. The patient appears stable so that the remainder of the MSE may be completed by another provider.  [place], PA-C 06/20/15 1432

## 2015-06-20 NOTE — ED Notes (Signed)
PT last checked  CBG  Days ago and last used insulin 1 week ago. Pt reports a change in health insurance  And has not been able to get insuline.

## 2015-06-20 NOTE — ED Notes (Signed)
PA hedges at bedside.  

## 2015-06-21 ENCOUNTER — Encounter (HOSPITAL_COMMUNITY): Payer: Self-pay | Admitting: Family Medicine

## 2015-06-21 DIAGNOSIS — A419 Sepsis, unspecified organism: Secondary | ICD-10-CM | POA: Diagnosis present

## 2015-06-21 DIAGNOSIS — Z79899 Other long term (current) drug therapy: Secondary | ICD-10-CM | POA: Diagnosis not present

## 2015-06-21 DIAGNOSIS — Z880 Allergy status to penicillin: Secondary | ICD-10-CM | POA: Diagnosis not present

## 2015-06-21 DIAGNOSIS — E1142 Type 2 diabetes mellitus with diabetic polyneuropathy: Secondary | ICD-10-CM | POA: Diagnosis present

## 2015-06-21 DIAGNOSIS — Z8249 Family history of ischemic heart disease and other diseases of the circulatory system: Secondary | ICD-10-CM | POA: Diagnosis not present

## 2015-06-21 DIAGNOSIS — E785 Hyperlipidemia, unspecified: Secondary | ICD-10-CM | POA: Diagnosis present

## 2015-06-21 DIAGNOSIS — N764 Abscess of vulva: Secondary | ICD-10-CM | POA: Diagnosis present

## 2015-06-21 DIAGNOSIS — L0291 Cutaneous abscess, unspecified: Secondary | ICD-10-CM | POA: Diagnosis present

## 2015-06-21 DIAGNOSIS — Z7984 Long term (current) use of oral hypoglycemic drugs: Secondary | ICD-10-CM | POA: Diagnosis not present

## 2015-06-21 DIAGNOSIS — I10 Essential (primary) hypertension: Secondary | ICD-10-CM | POA: Diagnosis present

## 2015-06-21 DIAGNOSIS — Z87891 Personal history of nicotine dependence: Secondary | ICD-10-CM | POA: Diagnosis not present

## 2015-06-21 DIAGNOSIS — K219 Gastro-esophageal reflux disease without esophagitis: Secondary | ICD-10-CM | POA: Diagnosis present

## 2015-06-21 DIAGNOSIS — Z888 Allergy status to other drugs, medicaments and biological substances status: Secondary | ICD-10-CM | POA: Diagnosis not present

## 2015-06-21 DIAGNOSIS — E871 Hypo-osmolality and hyponatremia: Secondary | ICD-10-CM | POA: Diagnosis present

## 2015-06-21 DIAGNOSIS — E1165 Type 2 diabetes mellitus with hyperglycemia: Secondary | ICD-10-CM | POA: Diagnosis present

## 2015-06-21 DIAGNOSIS — Z6836 Body mass index (BMI) 36.0-36.9, adult: Secondary | ICD-10-CM | POA: Diagnosis not present

## 2015-06-21 DIAGNOSIS — Z794 Long term (current) use of insulin: Secondary | ICD-10-CM | POA: Diagnosis not present

## 2015-06-21 DIAGNOSIS — Z801 Family history of malignant neoplasm of trachea, bronchus and lung: Secondary | ICD-10-CM | POA: Diagnosis not present

## 2015-06-21 LAB — CBC
HEMATOCRIT: 35.4 % — AB (ref 36.0–46.0)
HEMOGLOBIN: 11.8 g/dL — AB (ref 12.0–15.0)
MCH: 31.1 pg (ref 26.0–34.0)
MCHC: 33.3 g/dL (ref 30.0–36.0)
MCV: 93.4 fL (ref 78.0–100.0)
Platelets: 233 10*3/uL (ref 150–400)
RBC: 3.79 MIL/uL — AB (ref 3.87–5.11)
RDW: 12.4 % (ref 11.5–15.5)
WBC: 13.7 10*3/uL — ABNORMAL HIGH (ref 4.0–10.5)

## 2015-06-21 LAB — BASIC METABOLIC PANEL
Anion gap: 11 (ref 5–15)
BUN: 8 mg/dL (ref 6–20)
CHLORIDE: 104 mmol/L (ref 101–111)
CO2: 23 mmol/L (ref 22–32)
CREATININE: 0.85 mg/dL (ref 0.44–1.00)
Calcium: 8 mg/dL — ABNORMAL LOW (ref 8.9–10.3)
GFR calc non Af Amer: >60 mL/min (ref >60)
Glucose, Bld: 324 mg/dL — ABNORMAL HIGH (ref 65–99)
POTASSIUM: 3.9 mmol/L (ref 3.5–5.1)
Sodium: 138 mmol/L (ref 135–145)

## 2015-06-21 LAB — GLUCOSE, CAPILLARY
GLUCOSE-CAPILLARY: 295 mg/dL — AB (ref 65–99)
Glucose-Capillary: 290 mg/dL — ABNORMAL HIGH (ref 65–99)
Glucose-Capillary: 309 mg/dL — ABNORMAL HIGH (ref 65–99)
Glucose-Capillary: 234 mg/dL — ABNORMAL HIGH (ref 65–99)

## 2015-06-21 MED ORDER — INSULIN ASPART 100 UNIT/ML ~~LOC~~ SOLN
0.0000 [IU] | Freq: Three times a day (TID) | SUBCUTANEOUS | Status: DC
  Administered 2015-06-21: 11 [IU] via SUBCUTANEOUS
  Administered 2015-06-21: 8 [IU] via SUBCUTANEOUS
  Administered 2015-06-21: 3 [IU] via SUBCUTANEOUS
  Administered 2015-06-22: 5 [IU] via SUBCUTANEOUS
  Administered 2015-06-22: 3 [IU] via SUBCUTANEOUS

## 2015-06-21 MED ORDER — ONDANSETRON HCL 4 MG PO TABS: 4.0000 mg | ORAL_TABLET | Freq: Four times a day (QID) | ORAL | Status: DC | PRN

## 2015-06-21 MED ORDER — SULFAMETHOXAZOLE-TRIMETHOPRIM 800-160 MG PO TABS
1.0000 | ORAL_TABLET | Freq: Two times a day (BID) | ORAL | Status: DC
  Administered 2015-06-22: 1 via ORAL
  Filled 2015-06-21: qty 1

## 2015-06-21 MED ORDER — ENOXAPARIN SODIUM 40 MG/0.4ML ~~LOC~~ SOLN
40.0000 mg | Freq: Every day | SUBCUTANEOUS | Status: DC
  Administered 2015-06-21: 40 mg via SUBCUTANEOUS
  Filled 2015-06-21 (×2): qty 0.4

## 2015-06-21 MED ORDER — ONDANSETRON HCL 4 MG/2ML IJ SOLN: 4.0000 mg | Freq: Four times a day (QID) | INTRAMUSCULAR | Status: DC | PRN

## 2015-06-21 MED ORDER — INSULIN GLARGINE 100 UNIT/ML ~~LOC~~ SOLN
15.0000 [IU] | Freq: Two times a day (BID) | SUBCUTANEOUS | Status: DC
  Administered 2015-06-21 – 2015-06-22 (×2): 15 [IU] via SUBCUTANEOUS
  Filled 2015-06-21 (×3): qty 0.15

## 2015-06-21 MED ORDER — SODIUM CHLORIDE 0.9 % IV SOLN
INTRAVENOUS | Status: DC
  Administered 2015-06-21: 03:00:00 via INTRAVENOUS

## 2015-06-21 MED ORDER — SENNOSIDES-DOCUSATE SODIUM 8.6-50 MG PO TABS: 1.0000 | ORAL_TABLET | Freq: Every evening | ORAL | Status: DC | PRN

## 2015-06-21 MED ORDER — HYDROCODONE-ACETAMINOPHEN 5-325 MG PO TABS: 1.0000 | ORAL_TABLET | ORAL | Status: DC | PRN

## 2015-06-21 MED ORDER — PANTOPRAZOLE SODIUM 40 MG PO TBEC
40.0000 mg | DELAYED_RELEASE_TABLET | Freq: Every day | ORAL | Status: DC
  Administered 2015-06-21 – 2015-06-22 (×2): 40 mg via ORAL
  Filled 2015-06-21 (×2): qty 1

## 2015-06-21 MED ORDER — INSULIN GLARGINE 100 UNIT/ML ~~LOC~~ SOLN
20.0000 [IU] | Freq: Every day | SUBCUTANEOUS | Status: DC
  Administered 2015-06-21: 20 [IU] via SUBCUTANEOUS
  Filled 2015-06-21: qty 0.2

## 2015-06-21 MED ORDER — INSULIN ASPART 100 UNIT/ML ~~LOC~~ SOLN
0.0000 [IU] | Freq: Every day | SUBCUTANEOUS | Status: DC
  Administered 2015-06-21: 2 [IU] via SUBCUTANEOUS

## 2015-06-21 MED ORDER — ACETAMINOPHEN 325 MG PO TABS: 650.0000 mg | ORAL_TABLET | Freq: Four times a day (QID) | ORAL | Status: DC | PRN

## 2015-06-21 MED ORDER — GLIMEPIRIDE 4 MG PO TABS
4.0000 mg | ORAL_TABLET | Freq: Two times a day (BID) | ORAL | Status: DC
  Administered 2015-06-21 – 2015-06-22 (×3): 4 mg via ORAL
  Filled 2015-06-21 (×3): qty 1

## 2015-06-21 MED ORDER — ACETAMINOPHEN 650 MG RE SUPP: 650.0000 mg | Freq: Four times a day (QID) | RECTAL | Status: DC | PRN

## 2015-06-21 MED ORDER — INSULIN GLARGINE 100 UNIT/ML ~~LOC~~ SOLN
25.0000 [IU] | Freq: Two times a day (BID) | SUBCUTANEOUS | Status: DC
  Filled 2015-06-21: qty 0.25

## 2015-06-21 MED ORDER — PRAVASTATIN SODIUM 20 MG PO TABS
20.0000 mg | ORAL_TABLET | Freq: Every evening | ORAL | Status: DC
  Administered 2015-06-21: 20 mg via ORAL
  Filled 2015-06-21: qty 1

## 2015-06-21 MED ORDER — MEROPENEM 1 G IV SOLR
1.0000 g | Freq: Three times a day (TID) | INTRAVENOUS | Status: DC
  Administered 2015-06-21 – 2015-06-22 (×3): 1 g via INTRAVENOUS
  Filled 2015-06-21 (×7): qty 1

## 2015-06-21 NOTE — H&P (Signed)
History and Physical  Patient Name: Julie Prince     [id number]    DOB: 01-16-1961    DOA: 06/20/2015 Referring physician: Lenn Sink, PA-C PCP: Marcial Pacas, DO      Chief Complaint: Labial abscess  HPI: Julie Prince is a 55 y.o. female with a past medical history significant for IDDM and HTN who presents with labial abscess.  The patient was in her usual state of health until about 5 days ago when she started to develop a painful spot on her right labia. She was working out, so this was starting to get worse, and by today he had developed into a very large and painful red swelling.  Then today, she had chills and malaise, and the swelling was not getting better, so she came to the ER.  In the ED, she had a low-grade fever, tachycardia, tachypnea, and elevated lactate.  CT of the abdomen and pelvis with contrast showed a 3 cm fluid collection.  The abscess was incised and drained, but not sent for culture. Blood and urine cultures were obtained, and the patient appeared to have a UTI. [facility] were asked to admit for IV antibiotics.  The patient has no previous history of abscess.  She has no history of MRSA, and no one in the family has had any MRSA infection. She has changed insurance recently and has been out of her insulin for about 2 months.     Review of Systems:  Pt complains of chills, abscess, pain. Pt denies any confusion, syncope.  All other systems negative except as just noted or noted in the history of present illness.  Allergies  Allergen Reactions  . Penicillins Hives and Shortness Of Breath  . Metformin And Related Diarrhea  . Atorvastatin Itching    Prior to Admission medications   Medication Sig Start Date End Date Taking? Authorizing Provider  amLODipine (NORVASC) 5 MG tablet Take 1 tablet (5 mg total) by mouth daily. 07/31/14  Yes Sean Hommel, DO  glimepiride (AMARYL) 4 MG tablet Take 1 tablet (4 mg total) by mouth 2 (two) times daily. 09/15/14  Yes Sean  Hommel, DO  losartan-hydrochlorothiazide (HYZAAR) 100-25 MG per tablet Take 1 tablet by mouth daily. 07/31/14  Yes Sean Hommel, DO  pravastatin (PRAVACHOL) 20 MG tablet Take 1 tablet (20 mg total) by mouth daily. Patient taking differently: Take 20 mg by mouth every evening.  05/11/15  Yes Sean Hommel, DO  AMBULATORY NON FORMULARY MEDICATION Accu -Check Compact Plus model GT blood sugar testing strips and lancets Use to check blood daily.  Dx type 2 Diabetes 08/05/14   Marcial Pacas, DO  Insulin Glargine (LANTUS SOLOSTAR) 100 UNIT/ML Solostar Pen Inject 60 Units into the skin daily. Patient not taking: Reported on 06/20/2015 05/18/15   Marcial Pacas, DO  Insulin Pen Needle (NOVOFINE) 30G X 8 MM MISC Inject 10 each into the skin as needed. 09/01/14   Sean Hommel, DO  pantoprazole (PROTONIX) 40 MG tablet Take 1 tablet (40 mg total) by mouth daily. For Reflux 05/07/15   Marcial Pacas, DO    Past Medical History  Diagnosis Date  . Essential hypertension, benign 09/12/2013  . Hyperlipidemia 09/12/2013  . Type 2 diabetes mellitus ([place]) 09/12/2013    Dr. Posey Pronto at [place]   . Allergy   . Cataract   . GERD (gastroesophageal reflux disease)     Past Surgical History  Procedure Laterality Date  . Cataract extraction Left   . Right tube and ovary removed    .  Cesarean section  1990    Family history: family history includes Heart disease in her maternal aunt; Lung cancer in her mother. There is no history of Colon cancer, Esophageal cancer, Rectal cancer, or Stomach cancer.  Social History: Patient lives with her family. She is a Theme park manager. She does not smoke or use alcohol. She is independent with all IADLs and ADLs.       Physical Exam: BP 127/61 mmHg  Pulse 90  Temp(Src) 99.1 F (37.3 C) (Oral)  Resp 18  Ht 5\' 4"  (1.626 m)  Wt 100.7 kg (222 lb 0.1 oz)  BMI 38.09 kg/m2  SpO2 96% General appearance: Well-developed, adult female, alert and in no acute distress.  Conversational.   Eyes:  Anicteric, conjunctiva pink, lids and lashes normal.     ENT: No nasal deformity, discharge, or epistaxis.  OP moist without lesions.   Lymph: No cervical or supraclavicular lymphadenopathy. Skin: Warm and dry.  No jaundice.  No suspicious rashes or lesions. Cardiac: RRR, nl S1-S2, no murmurs appreciated.  Capillary refill is brisk.  JVP not visible.  No LE edema.  Radial and DP pulses 2+ and symmetric. Respiratory: Normal respiratory rate and rhythm.  CTAB without rales or wheezes. Abdomen: Abdomen soft without rigidity.  No TTP. No ascites, distension.   MSK: No deformities or effusions. Neuro: Sensorium intact and responding to questions, attention normal.  Speech is fluent.  Moves all extremities equally and with normal coordination.    Psych: Behavior appropriate.  Affect normal.  No evidence of aural or visual hallucinations or delusions.    GU: There is a small 1 cm incision on the lateral right labia majora, draining a small amount of sanguinous fluid, very little surrounding erythema.   Labs on Admission:  The metabolic panel shows hyperglycemia, hyponatremia. Transaminases and bilirubin are normal. Lactate is elevated, but resolved with fluids.  The UA shows bacteria, pyuria, and nitrites. Blood and urine cultures are pending. The complete blood count shows leukocytosis, without anemia thrombocytopenia.   Radiological Exams on Admission: Personally reviewed: Ct Pelvis W Contrast  06/20/2015  CLINICAL DATA:  Vulvar abscess and fever for 4 days. Evaluation extending into upper thigh per ordering physician request. EXAM: CT PELVIS WITH CONTRAST TECHNIQUE: Multidetector CT imaging of the pelvis was performed using the standard protocol following the bolus administration of intravenous contrast. CONTRAST:  100 cc ISOVUE-300 IOPAMIDOL (ISOVUE-300) INJECTION 61% COMPARISON:  None. FINDINGS: There is a circumscribed fluid collection within the posterior aspects of the right vulva, measuring  3.5 x 2.4 cm, compatible with the given history of abscess. There is an additional less well-defined component more anteriorly within the right vulva which is also suspicious for abscess and increases this measurement to 6.5 x 2.4 cm. There is extensive surrounding edema. No extension into the perineum. No intrapelvic abscess. Pelvic portion of the bowel is normal in caliber. No free fluid or inflammatory change seen within the intrapelvic pelvis. Atherosclerotic changes noted along the walls of the pelvic vasculature. Bladder appears normal. Uterus and adnexal regions are unremarkable. Mild degenerative change noted in the lower lumbar spine. No acute or suspicious osseous lesions seen. IMPRESSION: Findings compatible with right vulvar abscess. The most well-defined abscess component is within the posterior aspects of the rightvulva, measuring 3.5 x 2.4 cm. There is an additional less well-defined component more anteriorly which is also suspicious for abscess and increases this measurement to 6.5 x 2.4 cm. Associated surrounding edema. No intrapelvic extension.  No acute intrapelvic abnormality. Electronically  Signed   By: Franki Cabot M.D.   On: 06/20/2015 19:27    EKG: Independently reviewed. Weight 103, QTC 427, no ST changes.    Assessment/Plan 1. Sepsis with abscess:  This is new.  Suspected source abscess. Organism unknown. Patient meets criteria given tachycardia, tachypnea, leukocytosis, and evidence of organ dysfunction.  Lactate 2.65 mmol/L and repeat ordered within 6 hours.  Antibiotics delivered in the ED.    -Sepsis bundle utilized:  -Blood and urine cultures drawn  -30 ml/kg bolus given in ED, will repeat lactic acid  -Start targeted antibiotics with vancomycin, aztreonam, and metronidazole, based on suspected source of infection and allergies  -Repeat renal function and complete blood count in AM  -Code SEPSIS called to E-link    2. IDDM:  Hyperglycemic at admission. -Continue  glargine, patient takes 40 units at home, but not for months, will start with 20 units here and SSI -Hold glimepiride while inpatient  3. HTN:  -Hold home amlodipine and ARB/HCTZ until hemodynamics clearer  4. Hyponatremia:  -Corrects given hyperglycemia     DVT PPx: Lovenox Diet: Diabetic Consultants: None Code Status: FULL Family Communication: Daughters, present at bedside  Medical decision making: What exists of the patient's previous chart was reviewed in depth and the case was discussed with Lenn Sink, PA-C. Patient seen 2:30 AM on 06/21/2015.  Disposition Plan:  I recommend admission to med surg, observaiton status.  Clinical condition: stable.  Anticipate IV antibiotics until HR improves and then change to oral Bactrim/Cephalexin and discharge.      Edwin Dada [facility] Pager [phone]

## 2015-06-21 NOTE — Progress Notes (Signed)
[facility] Progress Note                                                                                                                                                                                                                      Patient Demographics:    Julie Prince, is a 55 y.o. female, DOB - [birth year], LR:[id number]  Admit date - 06/20/2015   Admitting Physician Edwin Dada, MD  Outpatient Primary MD for the patient is Marcial Pacas, DO  LOS - 0  Outpatient Specialists:   Chief Complaint  Patient presents with  . Abscess        Subjective:    [facility] today has, No headache, No chest pain, No abdominal pain - No Nausea, No new weakness tingling or numbness, No Cough - SOB.     Assessment  & Plan :     1.Right vulvar abscess. Status post incision and drainage in the ER on 06/20/2015, blood cultures so far negative, unfortunately abscess fluid was not Gram stain were cultured. Patient has penicillin allergy. Right now she is on aztreonam and Flagyl, discussed with ID physician will switch to meropenem for now then transitioned to oral Bactrim once better, case also discussed with GYN physician on-call doctor constant who is unable to come and see the patient today, she requests patient be sent to the office. We'll discuss with ID on choice of oral antibiotic and likely discharge in the morning if she is stable.  2. Dyslipidemia. On statin continue.   3. GERD. On PPI continue.   4. DM type II. Poor control. Increase Lantus to 15 twice a day, continue home dose Amaryl, had sliding scale and monitor, she has poor compliance. Was counseled to be more compliant with her medications. She had not taken her insulin for months.  Lab Results  Component Value Date   HGBA1C 14.7* 05/07/2015   CBG (last 3)   Recent Labs  06/20/15 1632 06/20/15 1812  06/21/15 0701  GLUCAP 437* 242* 290*       Code Status : Full  Family Communication  : Daughter bedside  Disposition Plan  : Home in the morning  Barriers For Discharge :   Consults  :  Dr. Elly Modena GYN physician over the phone on 06/21/2015  Procedures  :   R vulvar Abscess incision and drainage done in the ER for 04/10/2015  DVT Prophylaxis  :  Lovenox   Lab Results  Component Value Date   PLT 233  06/21/2015    Antibiotics  :    Anti-infectives    Start     Dose/Rate Route Frequency Ordered Stop   06/21/15 1000  vancomycin (VANCOCIN) 1,250 mg in sodium chloride 0.9 % 250 mL IVPB  Status:  Discontinued     1,250 mg 166.7 mL/hr over 90 Minutes Intravenous Every 12 hours 06/20/15 1828 06/21/15 1050   06/21/15 0400  metroNIDAZOLE (FLAGYL) IVPB 500 mg     500 mg 100 mL/hr over 60 Minutes Intravenous Every 8 hours 06/20/15 1828     06/21/15 0400  aztreonam (AZACTAM) 1 g in dextrose 5 % 50 mL IVPB     1 g 100 mL/hr over 30 Minutes Intravenous Every 8 hours 06/20/15 1828     06/20/15 1830  aztreonam (AZACTAM) 2 g in dextrose 5 % 50 mL IVPB     2 g 100 mL/hr over 30 Minutes Intravenous  Once 06/20/15 1822 06/20/15 1952   06/20/15 1830  metroNIDAZOLE (FLAGYL) IVPB 500 mg     500 mg 100 mL/hr over 60 Minutes Intravenous  Once 06/20/15 1822 06/20/15 2159   06/20/15 1830  vancomycin (VANCOCIN) IVPB 1000 mg/200 mL premix  Status:  Discontinued     1,000 mg 200 mL/hr over 60 Minutes Intravenous  Once 06/20/15 1822 06/20/15 1824   06/20/15 1830  vancomycin (VANCOCIN) 2,000 mg in sodium chloride 0.9 % 500 mL IVPB     2,000 mg 250 mL/hr over 120 Minutes Intravenous  Once 06/20/15 1824 06/20/15 2306        Objective:   Filed Vitals:   06/21/15 0100 06/21/15 0115 06/21/15 0227 06/21/15 0554  BP: 140/67 144/56 153/68 127/61  Pulse: 95 93 97 90  Temp:   99.5 F (37.5 C) 99.1 F (37.3 C)  TempSrc:   Oral Oral  Resp: 21 26 20 18   Height:   5\' 4"  (1.626 m)   Weight:    100.7 kg (222 lb 0.1 oz)   SpO2: 93% 100% 96% 96%    Wt Readings from Last 3 Encounters:  06/21/15 100.7 kg (222 lb 0.1 oz)  05/07/15 98.431 kg (217 lb)  04/15/15 97.977 kg (216 lb)     Intake/Output Summary (Last 24 hours) at 06/21/15 1051 Last data filed at 06/21/15 0918  Gross per 24 hour  Intake    120 ml  Output      0 ml  Net    120 ml     Physical Exam  Awake Alert, Oriented X 3, No new F.N deficits, Normal affect [place].AT,PERRAL Supple Neck,No JVD, No cervical lymphadenopathy appriciated.  Symmetrical Chest wall movement, Good air movement bilaterally, CTAB RRR,No Gallops,Rubs or new Murmurs, No Parasternal Heave +ve B.Sounds, Abd Soft, No tenderness, No organomegaly appriciated, No rebound - guarding or rigidity. No Cyanosis, Clubbing or edema, No new Rash or bruise R Vulva still has some induration    Data Review:    CBC  Recent Labs Lab 06/20/15 1445 06/21/15 0527  WBC 14.1* 13.7*  HGB 13.7 11.8*  HCT 39.7 35.4*  PLT 234 233  MCV 93.6 93.4  MCH 32.3 31.1  MCHC 34.5 33.3  RDW 12.3 12.4  LYMPHSABS 2.4  --   MONOABS 1.1*  --   EOSABS 0.1  --   BASOSABS 0.0  --     Chemistries   Recent Labs Lab 06/20/15 1445 06/21/15 0527  NA 130* 138  K 4.3 3.9  CL 95* 104  CO2 24 23  GLUCOSE 555* 324*  BUN 9 8  CREATININE 0.91 0.85  CALCIUM 8.6* 8.0*  AST 13*  --   ALT 9*  --   ALKPHOS 84  --   BILITOT 0.9  --    ------------------------------------------------------------------------------------------------------------------ No results for input(s): CHOL, HDL, LDLCALC, TRIG, CHOLHDL, LDLDIRECT in the last 72 hours.  Lab Results  Component Value Date   HGBA1C 14.7* 05/07/2015   ------------------------------------------------------------------------------------------------------------------ No results for input(s): TSH, T4TOTAL, T3FREE, THYROIDAB in the last 72 hours.  Invalid input(s):  FREET3 ------------------------------------------------------------------------------------------------------------------ No results for input(s): VITAMINB12, FOLATE, FERRITIN, TIBC, IRON, RETICCTPCT in the last 72 hours.  Coagulation profile No results for input(s): INR, PROTIME in the last 168 hours.  No results for input(s): DDIMER in the last 72 hours.  Cardiac Enzymes No results for input(s): CKMB, TROPONINI, MYOGLOBIN in the last 168 hours.  Invalid input(s): CK ------------------------------------------------------------------------------------------------------------------ No results found for: BNP  Inpatient Medications  Scheduled Meds: . aztreonam  1 g Intravenous Q8H  . enoxaparin (LOVENOX) injection  40 mg Subcutaneous Daily  . glimepiride  4 mg Oral BID  . insulin aspart  0-15 Units Subcutaneous TID WC  . insulin aspart  0-5 Units Subcutaneous QHS  . insulin glargine  25 Units Subcutaneous BID  . metronidazole  500 mg Intravenous Q8H  . pantoprazole  40 mg Oral Daily  . pravastatin  20 mg Oral QPM   Continuous Infusions:  PRN Meds:.acetaminophen **OR** [DISCONTINUED] acetaminophen, HYDROcodone-acetaminophen, ondansetron **OR** ondansetron (ZOFRAN) IV, senna-docusate  Micro Results No results found for this or any previous visit (from the past 240 hour(s)).  Radiology Reports Ct Pelvis W Contrast  06/20/2015  CLINICAL DATA:  Vulvar abscess and fever for 4 days. Evaluation extending into upper thigh per ordering physician request. EXAM: CT PELVIS WITH CONTRAST TECHNIQUE: Multidetector CT imaging of the pelvis was performed using the standard protocol following the bolus administration of intravenous contrast. CONTRAST:  100 cc ISOVUE-300 IOPAMIDOL (ISOVUE-300) INJECTION 61% COMPARISON:  None. FINDINGS: There is a circumscribed fluid collection within the posterior aspects of the right vulva, measuring 3.5 x 2.4 cm, compatible with the given history of abscess. There is  an additional less well-defined component more anteriorly within the right vulva which is also suspicious for abscess and increases this measurement to 6.5 x 2.4 cm. There is extensive surrounding edema. No extension into the perineum. No intrapelvic abscess. Pelvic portion of the bowel is normal in caliber. No free fluid or inflammatory change seen within the intrapelvic pelvis. Atherosclerotic changes noted along the walls of the pelvic vasculature. Bladder appears normal. Uterus and adnexal regions are unremarkable. Mild degenerative change noted in the lower lumbar spine. No acute or suspicious osseous lesions seen. IMPRESSION: Findings compatible with right vulvar abscess. The most well-defined abscess component is within the posterior aspects of the rightvulva, measuring 3.5 x 2.4 cm. There is an additional less well-defined component more anteriorly which is also suspicious for abscess and increases this measurement to 6.5 x 2.4 cm. Associated surrounding edema. No intrapelvic extension.  No acute intrapelvic abnormality. Electronically Signed   By: Franki Cabot M.D.   On: 06/20/2015 19:27    Time Spent in minutes  35   SINGH,PRASHANT K M.D on 06/21/2015 at 10:51 AM  Between 7am to 7pm - Pager - [phone]  After 7pm go to [email or web address] - password [facility]  [facility] -  Office  [phone]

## 2015-06-21 NOTE — Progress Notes (Signed)
Pharmacy Antibiotic Note  Julie Prince is a 55 y.o. female admitted on 06/20/2015 with abscess.  Pharmacy has been consulted for meropenem dosing today followed by septra dosing for discharge tomorrow.  Tmax 100, wbc 13.7. Renal function normal. Antibiotics being changed this morning from vanc/aztreonam to meropenem with plans to transition to oral bactrim in the morning in preparation for discharge home.  Plan: -Meropenem 1g q8 hours -Bactrim to start 4/3>> 1 DS tablet bid  Height: 5\' 4"  (162.6 cm) Weight: 222 lb 0.1 oz (100.7 kg) IBW/kg (Calculated) : 54.7  Temp (24hrs), Avg:99.4 F (37.4 C), Min:99.1 F (37.3 C), Max:100 F (37.8 C)   Recent Labs Lab 06/20/15 1445 06/20/15 1730 06/20/15 2236 06/21/15 0527  WBC 14.1*  --   --  13.7*  CREATININE 0.91  --   --  0.85  LATICACIDVEN  --  2.65* 0.76  --     Estimated Creatinine Clearance: 87.3 mL/min (by C-G formula based on Cr of 0.85).    Allergies  Allergen Reactions  . Penicillins Hives and Shortness Of Breath  . Metformin And Related Diarrhea  . Atorvastatin Itching    Antimicrobials this admission: Vanc 4/1>>4/2 Aztreo 4/1>>4/2 Flagyl 4/1>>4/2 Meropenem 4/2>> Septra 4/3>>  Dose adjustments this admission: N/A  Microbiology results: Pending  Thank you for allowing pharmacy to be a part of this patient's care.  Erin Hearing PharmD., BCPS Clinical Pharmacist Pager [phone] 06/21/2015 11:14 AM

## 2015-06-21 NOTE — ED Notes (Signed)
MD at bedside. 

## 2015-06-22 ENCOUNTER — Encounter: Payer: Self-pay | Admitting: Family Medicine

## 2015-06-22 ENCOUNTER — Telehealth: Payer: Self-pay

## 2015-06-22 DIAGNOSIS — N764 Abscess of vulva: Secondary | ICD-10-CM

## 2015-06-22 LAB — CBC
HEMATOCRIT: 35.3 % — AB (ref 36.0–46.0)
HEMOGLOBIN: 11.5 g/dL — AB (ref 12.0–15.0)
MCH: 30.6 pg (ref 26.0–34.0)
MCHC: 32.6 g/dL (ref 30.0–36.0)
MCV: 93.9 fL (ref 78.0–100.0)
Platelets: 242 10*3/uL (ref 150–400)
RBC: 3.76 MIL/uL — AB (ref 3.87–5.11)
RDW: 12.5 % (ref 11.5–15.5)
WBC: 8.5 10*3/uL (ref 4.0–10.5)

## 2015-06-22 LAB — URINE CULTURE

## 2015-06-22 LAB — GLUCOSE, CAPILLARY
GLUCOSE-CAPILLARY: 221 mg/dL — AB (ref 65–99)
GLUCOSE-CAPILLARY: 199 mg/dL — AB (ref 65–99)

## 2015-06-22 LAB — HEMOGLOBIN A1C
Hgb A1c MFr Bld: 13.3 % — ABNORMAL HIGH (ref 4.8–5.6)
MEAN PLASMA GLUCOSE: 335 mg/dL

## 2015-06-22 MED ORDER — HYDROCODONE-ACETAMINOPHEN 5-325 MG PO TABS: 1.0000 | ORAL_TABLET | ORAL | Status: DC | PRN

## 2015-06-22 MED ORDER — SULFAMETHOXAZOLE-TRIMETHOPRIM 800-160 MG PO TABS: 1.0000 | ORAL_TABLET | Freq: Two times a day (BID) | ORAL | Status: DC

## 2015-06-22 MED ORDER — INSULIN GLARGINE 100 UNIT/ML SOLOSTAR PEN: 60.0000 [IU] | PEN_INJECTOR | Freq: Every day | SUBCUTANEOUS | Status: DC

## 2015-06-22 NOTE — Telephone Encounter (Signed)
Daughter notified 

## 2015-06-22 NOTE — Telephone Encounter (Signed)
Daughter called stating that her mom was admitted and the ER doc is asking her insulin to be sent to her pharmacy. Please advise.

## 2015-06-22 NOTE — Telephone Encounter (Signed)
Lantus insulin rx sent to [facility] in [place]

## 2015-06-22 NOTE — Discharge Instructions (Signed)
Follow with Primary MD Marcial Pacas, DO in 7 days   Get CBC, CMP, 2 view Chest X ray checked  by Primary MD next visit.    Activity: As tolerated with Full fall precautions use walker/cane & assistance as needed   Disposition Home     Diet:   Heart Healthy Low carb.  For Heart failure patients - Check your Weight same time everyday, if you gain over 2 pounds, or you develop in leg swelling, experience more shortness of breath or chest pain, call your Primary MD immediately. Follow Cardiac Low Salt Diet and 1.5 lit/day fluid restriction.  Accuchecks 4 times/day, Once in AM empty stomach and then before each meal. Log in all results and show them to your Prim.MD in 3 days. If any glucose reading is under 80 or above 300 call your Prim MD immidiately. Follow Low glucose instructions for glucose under 80 as instructed.  On your next visit with your primary care physician please Get Medicines reviewed and adjusted.   Please request your Prim.MD to go over all Hospital Tests and Procedure/Radiological results at the follow up, please get all Hospital records sent to your Prim MD by signing hospital release before you go home.   If you experience worsening of your admission symptoms, develop shortness of breath, life threatening emergency, suicidal or homicidal thoughts you must seek medical attention immediately by calling 911 or calling your MD immediately  if symptoms less severe.  You Must read complete instructions/literature along with all the possible adverse reactions/side effects for all the Medicines you take and that have been prescribed to you. Take any new Medicines after you have completely understood and accpet all the possible adverse reactions/side effects.   Do not drive, operating heavy machinery, perform activities at heights, swimming or participation in water activities or provide baby sitting services if your were admitted for syncope or siezures until you have seen by  Primary MD or a Neurologist and advised to do so again.  Do not drive when taking Pain medications.    Do not take more than prescribed Pain, Sleep and Anxiety Medications  Special Instructions: If you have smoked or chewed Tobacco  in the last 2 yrs please stop smoking, stop any regular Alcohol  and or any Recreational drug use.  Wear Seat belts while driving.   Please note  You were cared for by a hospitalist during your hospital stay. If you have any questions about your discharge medications or the care you received while you were in the hospital after you are discharged, you can call the unit and asked to speak with the hospitalist on call if the hospitalist that took care of you is not available. Once you are discharged, your primary care physician will handle any further medical issues. Please note that NO REFILLS for any discharge medications will be authorized once you are discharged, as it is imperative that you return to your primary care physician (or establish a relationship with a primary care physician if you do not have one) for your aftercare needs so that they can reassess your need for medications and monitor your lab values.

## 2015-06-22 NOTE — Progress Notes (Signed)
Inpatient Diabetes Program Recommendations  AACE/ADA: New Consensus Statement on Inpatient Glycemic Control (2015)  Target Ranges:  Prepandial:   less than 140 mg/dL      Peak postprandial:   less than 180 mg/dL (1-2 hours)      Critically ill patients:  140 - 180 mg/dL   Review of Glycemic Control Results for RYLENN, VULGAMORE (MRN [id number]) as of 06/22/2015 12:28  Ref. Range 06/21/2015 07:01 06/21/2015 12:03 06/21/2015 16:27 06/21/2015 21:23 06/22/2015 06:40 06/22/2015 11:31  Glucose-Capillary Latest Ref Range: 65-99 mg/dL 290 (H) 295 (H) 309 (H) 234 (H) 221 (H) 199 (H)   Diabetes history: DM Type 2 Outpatient Diabetes medications: Lantus insulin 60 units (not taking last 2 weeks due to running out) + Amaryl 4 mg bid Current orders for Inpatient glycemic control: Lantus 15 units bid + Amaryl 2 mg bid + Novolog Correction scale Moderate 0-15  Inpatient Diabetes Program Recommendations:  Note patient is being discharged today. Spoke with patient and patient's daughter. Discussed elevated A1c, plate method, hypoglycemia- 15/15 rule. Patient has meter and strips to check CBGs. Patient states she has started exercising 3 times weekly at a gym where she attends with her daughter. Pt. States her daughter is coordinating getting a prescription for Lantus from her PCP. Gave information on  Lantus co-pay card available at the pharmacy if she needs it. Patient states understanding of needing to take her insulin regularly and not run out.  Thank you, Nani Gasser. Miyani Cronic, RN, MSN, CDE Inpatient Glycemic Control Team Team Pager [phone] (8am-5pm) 06/22/2015 12:33 PM

## 2015-06-22 NOTE — Progress Notes (Signed)
Pt ready for d/c home per MD. Discharge prescriptions and instructions reviewed with pt and her family, all questions answered. Pt's belongings were gathered and sent with family. Will be assisted to car by volunteer.   [place], Jerry Caras

## 2015-06-22 NOTE — Discharge Summary (Signed)
Julie Prince, is a 55 y.o. female  DOB 06-28-1960  MRN [id number].  Admission date:  06/20/2015  Admitting Physician  Edwin Dada, MD  Discharge Date:  06/22/2015   Primary MD  Marcial Pacas, DO  Recommendations for primary care physician for things to follow:   Check CBC, BMP in 2 days, monitor glycemic control closely. Needs close outpatient GYN follow-up.   Admission Diagnosis  Vulvar abscess [N76.4]   Discharge Diagnosis  Vulvar abscess [N76.4]     Principal Problem:   Sepsis (Julie Prince) Active Problems:   Essential hypertension, benign   Diabetic peripheral neuropathy associated with type 2 diabetes mellitus ([place])   Abscess   Vulvar abscess      Past Medical History  Diagnosis Date  . Essential hypertension, benign 09/12/2013  . Hyperlipidemia 09/12/2013  . Type 2 diabetes mellitus ([place]) 09/12/2013    Dr. Posey Pronto at [facility]   . Allergy   . Cataract   . GERD (gastroesophageal reflux disease)     Past Surgical History  Procedure Laterality Date  . Cataract extraction Left   . Right tube and ovary removed    . Cesarean section  1990       HPI  from the history and physical done on the day of admission:    Julie Prince is a 55 y.o. female with a past medical history significant for IDDM and HTN who presents with labial abscess.  The patient was in her usual state of health until about 5 days ago when she started to develop a painful spot on her right labia. She was working out, so this was starting to get worse, and by today he had developed into a very large and painful red swelling. Then today, she had chills and malaise, and the swelling was not getting better, so she came to the ER.  In the ED, she had a low-grade fever, tachycardia, tachypnea, and elevated lactate. CT of  the abdomen and pelvis with contrast showed a 3 cm fluid collection. The abscess was incised and drained, but not sent for culture. Blood and urine cultures were obtained, and the patient appeared to have a UTI. TRH were asked to admit for IV antibiotics.  The patient has no previous history of abscess. She has no history of MRSA, and no one in the family has had any MRSA infection. She has changed insurance recently and has been out of her insulin for about 2 months.     Hospital Course:     1.Right vulvar abscess. Status post incision and drainage in the ER on 06/20/2015, blood cultures so far negative, unfortunately abscess fluid was not Gram stain were cultured. Patient has penicillin allergy. Her case was discussed with ID physician Dr. Drucilla Schmidt, will be transitioned from IV meropenem to oral Bactrim for 7 more days and discharged home, her case was also discussed with GYN physician on-call doctor Constant on 06/21/2015 who is unable to come and see the patient but she requested that the patient be sent  to the office.   She is clinically better requested to follow with her PCP and GYN physician within 2 days, will be discharged on 7 more days of oral Bactrim.   2. Dyslipidemia. On statin continue.   3. GERD. On PPI continue.   4. DM type II. Poor control. Note patient had run out of her diabetic medications about 2 weeks ago, she is calling her PCP office prior to discharge to get refills [facility]. We'll request PCP to monitor her glycemic control closely. Note her A1c was close to 14 this visit. Her CBGs need to be closely followed along with A1c.       Discharge Condition: Stable  Follow UP  Follow-up Information    Follow up with Marcial Pacas, DO. Schedule an appointment as soon as possible for a visit in 3 days.   Specialty:  Family Medicine   Contact information:   [id number] [place] [address] [phone]       Follow up with DOVE,MYRA C.,  MD. Schedule an appointment as soon as possible for a visit in 3 days.   Specialty:  Obstetrics and Gynecology   Contact information:   [place] [address] [phone]        Consults obtained - GYN Dr Elly Modena on 06-21-15  Diet and Activity recommendation: See Discharge Instructions below  Discharge Instructions       Discharge Instructions    Discharge instructions    Complete by:  As directed   Follow with Primary MD Marcial Pacas, DO in 7 days   Get CBC, CMP, 2 view Chest X ray checked  by Primary MD next visit.    Activity: As tolerated with Full fall precautions use walker/cane & assistance as needed   Disposition Home     Diet:   Heart Healthy Low carb.  For Heart failure patients - Check your Weight same time everyday, if you gain over 2 pounds, or you develop in leg swelling, experience more shortness of breath or chest pain, call your Primary MD immediately. Follow Cardiac Low Salt Diet and 1.5 lit/day fluid restriction.  Accuchecks 4 times/day, Once in AM empty stomach and then before each meal. Log in all results and show them to your Prim.MD in 3 days. If any glucose reading is under 80 or above 300 call your Prim MD immidiately. Follow Low glucose instructions for glucose under 80 as instructed.  On your next visit with your primary care physician please Get Medicines reviewed and adjusted.   Please request your Prim.MD to go over all Hospital Tests and Procedure/Radiological results at the follow up, please get all Hospital records sent to your Prim MD by signing hospital release before you go home.   If you experience worsening of your admission symptoms, develop shortness of breath, life threatening emergency, suicidal or homicidal thoughts you must seek medical attention immediately by calling 911 or calling your MD immediately  if symptoms less severe.  You Must read complete instructions/literature along with all the possible  adverse reactions/side effects for all the Medicines you take and that have been prescribed to you. Take any new Medicines after you have completely understood and accpet all the possible adverse reactions/side effects.   Do not drive, operating heavy machinery, perform activities at heights, swimming or participation in water activities or provide baby sitting services if your were admitted for syncope or siezures until you have seen by Primary MD or a  Neurologist and advised to do so again.  Do not drive when taking Pain medications.    Do not take more than prescribed Pain, Sleep and Anxiety Medications  Special Instructions: If you have smoked or chewed Tobacco  in the last 2 yrs please stop smoking, stop any regular Alcohol  and or any Recreational drug use.  Wear Seat belts while driving.   Please note  You were cared for by a hospitalist during your hospital stay. If you have any questions about your discharge medications or the care you received while you were in the hospital after you are discharged, you can call the unit and asked to speak with the hospitalist on call if the hospitalist that took care of you is not available. Once you are discharged, your primary care physician will handle any further medical issues. Please note that NO REFILLS for any discharge medications will be authorized once you are discharged, as it is imperative that you return to your primary care physician (or establish a relationship with a primary care physician if you do not have one) for your aftercare needs so that they can reassess your need for medications and monitor your lab values.     Increase activity slowly    Complete by:  As directed              Discharge Medications       Medication List    TAKE these medications        AMBULATORY NON FORMULARY MEDICATION  Accu -Check Compact Plus model GT blood sugar testing strips and lancets Use to check blood daily.  Dx type 2 Diabetes      amLODipine 5 MG tablet  Commonly known as:  NORVASC  Take 1 tablet (5 mg total) by mouth daily.     glimepiride 4 MG tablet  Commonly known as:  AMARYL  Take 1 tablet (4 mg total) by mouth 2 (two) times daily.     HYDROcodone-acetaminophen 5-325 MG tablet  Commonly known as:  NORCO/VICODIN  Take 1 tablet by mouth every 4 (four) hours as needed for moderate pain.     Insulin Glargine 100 UNIT/ML Solostar Pen  Commonly known as:  LANTUS SOLOSTAR  Inject 60 Units into the skin daily.     Insulin Pen Needle 30G X 8 MM Misc  Commonly known as:  NOVOFINE  Inject 10 each into the skin as needed.     losartan-hydrochlorothiazide 100-25 MG tablet  Commonly known as:  HYZAAR  Take 1 tablet by mouth daily.     pantoprazole 40 MG tablet  Commonly known as:  PROTONIX  Take 1 tablet (40 mg total) by mouth daily. For Reflux     pravastatin 20 MG tablet  Commonly known as:  PRAVACHOL  Take 1 tablet (20 mg total) by mouth daily.     sulfamethoxazole-trimethoprim 800-160 MG tablet  Commonly known as:  BACTRIM DS,SEPTRA DS  Take 1 tablet by mouth every 12 (twelve) hours.        Major procedures and Radiology Reports - PLEASE review detailed and final reports for all details, in brief -       Ct Pelvis W Contrast  06/20/2015  CLINICAL DATA:  Vulvar abscess and fever for 4 days. Evaluation extending into upper thigh per ordering physician request. EXAM: CT PELVIS WITH CONTRAST TECHNIQUE: Multidetector CT imaging of the pelvis was performed using the standard protocol following the bolus administration of intravenous contrast. CONTRAST:  100 cc ISOVUE-300  IOPAMIDOL (ISOVUE-300) INJECTION 61% COMPARISON:  None. FINDINGS: There is a circumscribed fluid collection within the posterior aspects of the right vulva, measuring 3.5 x 2.4 cm, compatible with the given history of abscess. There is an additional less well-defined component more anteriorly within the right vulva which is also suspicious for  abscess and increases this measurement to 6.5 x 2.4 cm. There is extensive surrounding edema. No extension into the perineum. No intrapelvic abscess. Pelvic portion of the bowel is normal in caliber. No free fluid or inflammatory change seen within the intrapelvic pelvis. Atherosclerotic changes noted along the walls of the pelvic vasculature. Bladder appears normal. Uterus and adnexal regions are unremarkable. Mild degenerative change noted in the lower lumbar spine. No acute or suspicious osseous lesions seen. IMPRESSION: Findings compatible with right vulvar abscess. The most well-defined abscess component is within the posterior aspects of the rightvulva, measuring 3.5 x 2.4 cm. There is an additional less well-defined component more anteriorly which is also suspicious for abscess and increases this measurement to 6.5 x 2.4 cm. Associated surrounding edema. No intrapelvic extension.  No acute intrapelvic abnormality. Electronically Signed   By: Franki Cabot M.D.   On: 06/20/2015 19:27    Micro Results      Recent Results (from the past 240 hour(s))  Blood culture (routine x 2)     Status: None (Preliminary result)   Collection Time: 06/20/15  2:45 PM  Result Value Ref Range Status   Specimen Description BLOOD LEFT URINE, CATHETERIZED  Final   Special Requests BOTTLES DRAWN AEROBIC AND ANAEROBIC 5CC  Final   Culture NO GROWTH < 24 HOURS  Final   Report Status PENDING  Incomplete  Blood culture (routine x 2)     Status: None (Preliminary result)   Collection Time: 06/20/15  5:08 PM  Result Value Ref Range Status   Specimen Description BLOOD RIGHT HAND  Final   Special Requests BOTTLES DRAWN AEROBIC ONLY 5CC  Final   Culture NO GROWTH < 24 HOURS  Final   Report Status PENDING  Incomplete  Urine culture     Status: None   Collection Time: 06/20/15  6:19 PM  Result Value Ref Range Status   Specimen Description URINE, CLEAN CATCH  Final   Special Requests NONE  Final   Culture MULTIPLE  SPECIES PRESENT, SUGGEST RECOLLECTION  Final   Report Status 06/22/2015 FINAL  Final       Today   Subjective    Julie Prince today has no headache,no chest abdominal pain,no new weakness tingling or numbness, feels much better wants to go home today.     Objective   Blood pressure 138/68, pulse 84, temperature 98.7 F (37.1 C), temperature source Oral, resp. rate 18, height 5\' 4"  (1.626 m), weight 100.7 kg (222 lb 0.1 oz), SpO2 98 %.   Intake/Output Summary (Last 24 hours) at 06/22/15 1100 Last data filed at 06/22/15 0830  Gross per 24 hour  Intake 1621.25 ml  Output      0 ml  Net 1621.25 ml    Exam Awake Alert, Oriented x 3, No new F.N deficits, Normal affect .AT,PERRAL Supple Neck,No JVD, No cervical lymphadenopathy appriciated.  Symmetrical Chest wall movement, Good air movement bilaterally, CTAB RRR,No Gallops,Rubs or new Murmurs, No Parasternal Heave +ve B.Sounds, Abd Soft, Non tender, No organomegaly appriciated, No rebound -guarding or rigidity. No Cyanosis, Clubbing or edema, No new Rash or bruise, R labia mildly indurated   Data Review   CBC w  Diff: Lab Results  Component Value Date   WBC 8.5 06/22/2015   HGB 11.5* 06/22/2015   HCT 35.3* 06/22/2015   PLT 242 06/22/2015   LYMPHOPCT 17 06/20/2015   MONOPCT 8 06/20/2015   EOSPCT 1 06/20/2015   BASOPCT 0 06/20/2015    CMP: Lab Results  Component Value Date   NA 138 06/21/2015   K 3.9 06/21/2015   CL 104 06/21/2015   CO2 23 06/21/2015   BUN 8 06/21/2015   CREATININE 0.85 06/21/2015   CREATININE 0.79 07/31/2014   PROT 7.1 06/20/2015   ALBUMIN 3.1* 06/20/2015   BILITOT 0.9 06/20/2015   ALKPHOS 84 06/20/2015   AST 13* 06/20/2015   ALT 9* 06/20/2015  . Lab Results  Component Value Date   HGBA1C 14.7* 05/07/2015   CBG (last 3)   Recent Labs  06/21/15 1627 06/21/15 2123 06/22/15 0640  GLUCAP 309* 234* 221*      Total Time in preparing paper work, data evaluation and todays exam -  35 minutes  Lala Lund K M.D on 06/22/2015 at 11:00 AM  [facility]   Office  [phone]

## 2015-06-23 MED ORDER — INSULIN DETEMIR 100 UNIT/ML FLEXPEN: PEN_INJECTOR | SUBCUTANEOUS | Status: DC

## 2015-06-25 ENCOUNTER — Ambulatory Visit (INDEPENDENT_AMBULATORY_CARE_PROVIDER_SITE_OTHER): Payer: 59 | Admitting: Obstetrics & Gynecology

## 2015-06-25 ENCOUNTER — Encounter: Payer: Self-pay | Admitting: Obstetrics & Gynecology

## 2015-06-25 VITALS — BP 154/90 | HR 126 | Temp 98.4°F | Ht 64.0 in | Wt 224.0 lb

## 2015-06-25 DIAGNOSIS — N764 Abscess of vulva: Secondary | ICD-10-CM | POA: Diagnosis not present

## 2015-06-25 LAB — CULTURE, BLOOD (ROUTINE X 2)
Culture: NO GROWTH
Culture: NO GROWTH

## 2015-06-26 ENCOUNTER — Encounter: Payer: Self-pay | Admitting: Family Medicine

## 2015-06-26 ENCOUNTER — Ambulatory Visit (INDEPENDENT_AMBULATORY_CARE_PROVIDER_SITE_OTHER): Payer: 59

## 2015-06-26 ENCOUNTER — Ambulatory Visit (INDEPENDENT_AMBULATORY_CARE_PROVIDER_SITE_OTHER): Payer: 59 | Admitting: Family Medicine

## 2015-06-26 VITALS — BP 172/104 | HR 100 | Wt 224.0 lb

## 2015-06-26 DIAGNOSIS — R000 Tachycardia, unspecified: Secondary | ICD-10-CM

## 2015-06-26 DIAGNOSIS — D72829 Elevated white blood cell count, unspecified: Secondary | ICD-10-CM | POA: Diagnosis not present

## 2015-06-26 LAB — CBC
HCT: 40 % (ref 35.0–45.0)
HEMOGLOBIN: 13.2 g/dL (ref 11.7–15.5)
MCH: 31 pg (ref 27.0–33.0)
MCHC: 33 g/dL (ref 32.0–36.0)
MCV: 93.9 fL (ref 80.0–100.0)
MPV: 11.2 fL (ref 7.5–12.5)
Platelets: 381 10*3/uL (ref 140–400)
RBC: 4.26 MIL/uL (ref 3.80–5.10)
RDW: 13.2 % (ref 11.0–15.0)
WBC: 7.5 10*3/uL (ref 3.8–10.8)

## 2015-06-26 MED ORDER — FLUCONAZOLE 150 MG PO TABS
ORAL_TABLET | ORAL | Status: AC
Start: 2015-06-26 — End: 2015-07-02

## 2015-06-26 NOTE — Progress Notes (Signed)
CC: Julie Prince is a 55 y.o. female is here for Hospitalization Follow-up   Subjective: HPI:  Hospital follow-up for vulvar abscess. She was hospitalized for 3 days to receive IV antibiotics after incision and drainage of a vulvar abscess. She was transitioned to Bactrim and she has been taking this twice a day as prescribed, the regimen well and on Monday. She denies any known side effects. She was seen by GYN yesterday and from her report was told to go to [facility] to be evaluated at a emergency room. She had an EKG done there which showed sinus tachycardia and a CT scan of the abdomen and pelvis showing no return of her abscess. She tells me that due to all the commotion she hasn't been able to fall sleep and rest over the last 24 hours. She has no complaints today other than she's tired and wants to go home to sleep. She denies any fevers, chills, abdominal pain, nor dysuria. She tells that she was told to come today for follow-up to get a chest x-ray and routine blood work.   Review Of Systems Outlined In HPI  Past Medical History  Diagnosis Date  . Essential hypertension, benign 09/12/2013  . Hyperlipidemia 09/12/2013  . Type 2 diabetes mellitus ([place]) 09/12/2013    Dr. Posey Pronto at [facility]   . Allergy   . Cataract   . GERD (gastroesophageal reflux disease)     Past Surgical History  Procedure Laterality Date  . Cataract extraction Left   . Right tube and ovary removed    . Cesarean section  1990   Family History  Problem Relation Age of Onset  . Colon cancer Neg Hx   . Esophageal cancer Neg Hx   . Rectal cancer Neg Hx   . Stomach cancer Neg Hx   . Lung cancer Mother   . Heart disease Maternal Aunt     Social History   Social History  . Marital Status: Married    Spouse Name: N/A  . Number of Children: N/A  . Years of Education: N/A   Occupational History  . Not on file.   Social History Main Topics  . Smoking status: Former Research scientist (life sciences)  . Smokeless  tobacco: Never Used     Comment: quit 30 plus years ago.  . Alcohol Use: No  . Drug Use: No  . Sexual Activity: Not on file   Other Topics Concern  . Not on file   Social History Narrative     Objective: BP 172/104 mmHg  Pulse 100  Wt 224 lb (101.606 kg)  Vital signs reviewed. General: Alert and Oriented, No Acute Distress HEENT: Pupils equal, round, reactive to light. Conjunctivae clear.  External ears unremarkable.  Moist mucous membranes. Lungs: Clear and comfortable work of breathing, speaking in full sentences without accessory muscle use. Cardiac: Regular rate and rhythm.  Neuro: CN II-XII grossly intact, gait normal. Extremities: No peripheral edema.  Strong peripheral pulses.  Mental Status: No depression, anxiety, nor agitation. Logical though process. Skin: Warm and dry.  Assessment & Plan: Julie Prince was seen today for hospitalization follow-up.  Diagnoses and all orders for this visit:  Leukocytosis -     CBC  Tachycardia -     COMPLETE METABOLIC PANEL WITH GFR -     CBC -     DG Chest 2 View; Future  Other orders -     fluconazole (DIFLUCAN) 150 MG tablet; Take one tab, may take second tab if no  improvement after 72 hours.   Leukocytosis: Checking CBC, at time of discharge she had normalized and is normal today for further testing is needed for this issue. Tachycardia: This is the only thing I can imagine why she was told to have a chest x-ray so we will be ordered today and also ruling out electrolyte abnormality with metabolic panel. She's worried that Bactrim will cause her to get a yeast infection and she is requesting consult which seems reasonable.  25 minutes spent face-to-face during visit today of which at least 50% was counseling or coordinating care regarding: 1. Leukocytosis   2. Tachycardia       Return if symptoms worsen or fail to improve.

## 2015-06-27 LAB — COMPLETE METABOLIC PANEL WITH GFR
ALBUMIN: 3.4 g/dL — AB (ref 3.6–5.1)
ALK PHOS: 76 U/L (ref 33–130)
ALT: 50 U/L — ABNORMAL HIGH (ref 6–29)
AST: 37 U/L — ABNORMAL HIGH (ref 10–35)
BILIRUBIN TOTAL: 0.3 mg/dL (ref 0.2–1.2)
BUN: 9 mg/dL (ref 7–25)
CO2: 23 mmol/L (ref 20–31)
Calcium: 8.7 mg/dL (ref 8.6–10.4)
Chloride: 102 mmol/L (ref 98–110)
Creat: 0.89 mg/dL (ref 0.50–1.05)
GFR, EST NON AFRICAN AMERICAN: 74 mL/min (ref >=60)
GFR, Est African American: 85 mL/min (ref >=60)
Glucose, Bld: 215 mg/dL — ABNORMAL HIGH (ref 65–99)
POTASSIUM: 4.5 mmol/L (ref 3.5–5.3)
Sodium: 137 mmol/L (ref 135–146)
TOTAL PROTEIN: 6.4 g/dL (ref 6.1–8.1)

## 2015-06-30 NOTE — Progress Notes (Signed)
   Subjective:    Patient ID: Julie Prince, female    DOB: 19-Apr-1960, 55 y.o.   MRN: [id number]  HPI This pleasant diabetic obese BF is here for a follow up after an overnight admission for a right labial abscess which had been I&D'd about 5 days ago. She was sent home on po bactrim. She feels relatively well.   Review of Systems     Objective:   Physical Exam [id number] Abd- obese, benign Right labia majora with a 2 cm x 2cm deep incision with lots of green discharge and fibrinous coverage of the wound walls.I used a q tip to do a debridement of this area. The labia itself is very dark and dusky, not at all the same as her other labia majora. Sensation is intact.       Assessment & Plan:  Possible necrotizing fasciitis- I spoke with Dr. Rogelio Seen who advised the patient to come to [facility] for evaluation.

## 2015-07-28 ENCOUNTER — Other Ambulatory Visit: Payer: Self-pay | Admitting: Family Medicine

## 2015-08-04 ENCOUNTER — Other Ambulatory Visit: Payer: Self-pay | Admitting: Family Medicine

## 2015-08-04 ENCOUNTER — Ambulatory Visit: Payer: 59 | Admitting: Family Medicine

## 2015-08-22 ENCOUNTER — Other Ambulatory Visit: Payer: Self-pay | Admitting: Family Medicine

## 2015-08-30 ENCOUNTER — Other Ambulatory Visit: Payer: Self-pay | Admitting: Family Medicine

## 2015-09-04 ENCOUNTER — Other Ambulatory Visit: Payer: Self-pay | Admitting: Family Medicine

## 2015-09-11 ENCOUNTER — Other Ambulatory Visit: Payer: Self-pay

## 2015-09-11 ENCOUNTER — Other Ambulatory Visit: Payer: Self-pay | Admitting: Family Medicine

## 2015-09-14 ENCOUNTER — Other Ambulatory Visit: Payer: Self-pay | Admitting: Family Medicine

## 2015-09-28 ENCOUNTER — Ambulatory Visit (INDEPENDENT_AMBULATORY_CARE_PROVIDER_SITE_OTHER): Payer: 59 | Admitting: Family Medicine

## 2015-09-28 ENCOUNTER — Encounter: Payer: Self-pay | Admitting: Family Medicine

## 2015-09-28 VITALS — BP 178/97 | HR 99 | Wt 231.0 lb

## 2015-09-28 DIAGNOSIS — I10 Essential (primary) hypertension: Secondary | ICD-10-CM | POA: Diagnosis not present

## 2015-09-28 DIAGNOSIS — Z794 Long term (current) use of insulin: Secondary | ICD-10-CM

## 2015-09-28 DIAGNOSIS — E119 Type 2 diabetes mellitus without complications: Secondary | ICD-10-CM

## 2015-09-28 LAB — POCT GLYCOSYLATED HEMOGLOBIN (HGB A1C): Hemoglobin A1C: 8.6

## 2015-09-28 MED ORDER — INSULIN DETEMIR 100 UNIT/ML FLEXPEN: PEN_INJECTOR | SUBCUTANEOUS | Status: DC

## 2015-09-28 MED ORDER — LOSARTAN POTASSIUM-HCTZ 100-25 MG PO TABS: 1.0000 | ORAL_TABLET | Freq: Every day | ORAL | Status: DC

## 2015-09-28 MED ORDER — SULFAMETHOXAZOLE-TRIMETHOPRIM 800-160 MG PO TABS: 1.0000 | ORAL_TABLET | Freq: Two times a day (BID) | ORAL | Status: DC

## 2015-09-28 MED ORDER — DAPAGLIFLOZIN PROPANEDIOL 10 MG PO TABS: 10.0000 mg | ORAL_TABLET | Freq: Every day | ORAL | Status: DC

## 2015-09-28 MED ORDER — AMLODIPINE BESYLATE 10 MG PO TABS: 10.0000 mg | ORAL_TABLET | Freq: Every day | ORAL | Status: DC

## 2015-09-28 MED ORDER — INSULIN PEN NEEDLE 30G X 8 MM MISC: Status: DC

## 2015-09-28 NOTE — Progress Notes (Signed)
CC: Julie Prince is a 55 y.o. female is here for Hyperglycemia; Hypertension; and Referral   Subjective: HPI:  Follow-up type 2 diabetes: Fasting blood sugars are ranging between 74-200, nonfasting blood sugars are ranging from 150-250. She's taking  glimepiride daily and she is taking Levemir every evening but only taking anywhere from 12-20 units. She tells me she is to follow the sliding scale that she thinks is working out well and does not like the idea of taking the 60 units that was proposed at her last visit. Denies vision disturbance or any motor or sensory disturbances.  Follow-up essential hypertension: She is taking amlodipine with her outside blood pressures to report. She denies chest pain shortness of breath orthopnea nor peripheral edema   Review Of Systems Outlined In HPI  Past Medical History  Diagnosis Date  . Essential hypertension, benign 09/12/2013  . Hyperlipidemia 09/12/2013  . Type 2 diabetes mellitus ([place]) 09/12/2013    Dr. Posey Pronto at [place] [facility]   . Allergy   . Cataract   . GERD (gastroesophageal reflux disease)     Past Surgical History  Procedure Laterality Date  . Cataract extraction Left   . Right tube and ovary removed    . Cesarean section  1990   Family History  Problem Relation Age of Onset  . Colon cancer Neg Hx   . Esophageal cancer Neg Hx   . Rectal cancer Neg Hx   . Stomach cancer Neg Hx   . Lung cancer Mother   . Heart disease Maternal Aunt     Social History   Social History  . Marital Status: Married    Spouse Name: N/A  . Number of Children: N/A  . Years of Education: N/A   Occupational History  . Not on file.   Social History Main Topics  . Smoking status: Former Research scientist (life sciences)  . Smokeless tobacco: Never Used     Comment: quit 30 plus years ago.  . Alcohol Use: No  . Drug Use: No  . Sexual Activity: Not on file   Other Topics Concern  . Not on file   Social History Narrative     Objective: BP 178/97 mmHg   Pulse 99  Wt 231 lb (104.781 kg)  General: Alert and Oriented, No Acute Distress HEENT: Pupils equal, round, reactive to light. Conjunctivae clear.  Moist mucous membranes Lungs: Clear to auscultation bilaterally, no wheezing/ronchi/rales.  Comfortable work of breathing. Good air movement. Cardiac: Regular rate and rhythm. Normal S1/S2.  No murmurs, rubs, nor gallops.   Extremities: No peripheral edema.  Strong peripheral pulses.  Mental Status: No depression, anxiety, nor agitation. Skin: Warm and dry.  Assessment & Plan: [name] was seen today for hyperglycemia, hypertension and referral.  Diagnoses and all orders for this visit:  Type 2 diabetes mellitus without complication, with long-term current use of insulin ([place]) -     Ambulatory referral to Endocrinology -     Insulin Detemir (LEVEMIR FLEXTOUCH) 100 UNIT/ML Pen; Inject 40 units subcutaneously daily. -     dapagliflozin propanediol (FARXIGA) 10 MG TABS tablet; Take 10 mg by mouth daily. Use savings voucher. -     POCT HgB A1C  Essential hypertension, benign -     amLODipine (NORVASC) 10 MG tablet; Take 1 tablet (10 mg total) by mouth daily. -     losartan-hydrochlorothiazide (HYZAAR) 100-25 MG tablet; Take 1 tablet by mouth daily.  Other orders -     Discontinue: sulfamethoxazole-trimethoprim (BACTRIM DS,SEPTRA DS) 800-160 MG  tablet; Take 1 tablet by mouth 2 (two) times daily. -     Insulin Pen Needle (NOVOFINE) 30G X 8 MM MISC; USE AS DIRECTED TO INJECT INSULIN   Type 2 diabetes: A1c of 8.0, uncontrolled, encouraged her to take 40 units of Levemir every evening and adding on a new medication of farxiga to replace glimepiride. Essential hypertension: Controlled continue amlodipine  Please disregard the Bactrim order above, I personally called her pharmacy to discontinue this erroneous order  Return in about 3 months (around 12/29/2015) for BP and Sugar.

## 2015-09-29 ENCOUNTER — Telehealth: Payer: Self-pay

## 2015-09-29 MED ORDER — SULFAMETHOXAZOLE-TRIMETHOPRIM 800-160 MG PO TABS: 1.0000 | ORAL_TABLET | Freq: Two times a day (BID) | ORAL | Status: DC

## 2015-09-29 NOTE — Telephone Encounter (Signed)
Pt.notified

## 2015-09-29 NOTE — Telephone Encounter (Signed)
Pt reports that when she was in office yesterday she forgot to mention that she's been having flank pain, urinary odor, and urgency for the past couple days.   She is asking could something be called in?  Also when she picked up [place] yesterday the pharmacist stated that she'll get the Rx that one time but she'll have a co-pay next time. Please advise.

## 2015-09-29 NOTE — Telephone Encounter (Signed)
I'll send bactrim to her pharmacy, if symptoms persist she'll need to come in to collect a urine sample.  Will you also let her know that the pharmacist is incorrectly processing this card.  If they ask for a co-pay next time I'd recommend she ask the pharmacist to see if he/she can check with another pharmacist to see how to correctly process the card.  I've had trouble like this in the past and 100% of the time the pharmacist isn't processing the card correctly.

## 2015-10-06 ENCOUNTER — Other Ambulatory Visit: Payer: Self-pay | Admitting: Family Medicine

## 2015-10-27 ENCOUNTER — Other Ambulatory Visit: Payer: Self-pay | Admitting: Family Medicine

## 2015-10-29 ENCOUNTER — Other Ambulatory Visit: Payer: Self-pay | Admitting: Family Medicine

## 2015-11-10 ENCOUNTER — Other Ambulatory Visit: Payer: Self-pay | Admitting: Family Medicine

## 2016-01-11 ENCOUNTER — Other Ambulatory Visit: Payer: Self-pay | Admitting: *Deleted

## 2016-01-11 MED ORDER — GLIMEPIRIDE 4 MG PO TABS: ORAL_TABLET | ORAL | 0 refills | Status: DC

## 2016-01-13 ENCOUNTER — Telehealth: Payer: Self-pay | Admitting: Family Medicine

## 2016-01-13 NOTE — Telephone Encounter (Signed)
Left a message for pt to call our office and schedule a f/u appt with a provider on her meds

## 2016-02-10 ENCOUNTER — Encounter: Payer: Self-pay | Admitting: Gastroenterology

## 2016-02-10 ENCOUNTER — Ambulatory Visit (INDEPENDENT_AMBULATORY_CARE_PROVIDER_SITE_OTHER): Payer: BLUE CROSS/BLUE SHIELD | Admitting: Gastroenterology

## 2016-02-10 VITALS — BP 130/72 | HR 120 | Ht 64.0 in | Wt 233.0 lb

## 2016-02-10 DIAGNOSIS — K5900 Constipation, unspecified: Secondary | ICD-10-CM

## 2016-02-10 DIAGNOSIS — K219 Gastro-esophageal reflux disease without esophagitis: Secondary | ICD-10-CM | POA: Diagnosis not present

## 2016-02-10 DIAGNOSIS — R140 Abdominal distension (gaseous): Secondary | ICD-10-CM | POA: Diagnosis not present

## 2016-02-10 DIAGNOSIS — R1310 Dysphagia, unspecified: Secondary | ICD-10-CM | POA: Diagnosis not present

## 2016-02-10 NOTE — Patient Instructions (Addendum)
You have been scheduled for an endoscopy. Please follow written instructions given to you at your visit today. If you use inhalers (even only as needed), please bring them with you on the day of your procedure. Your physician has requested that you go to www.startemmi.com and enter the access code given to you at your visit today. This web site gives a general overview about your procedure. However, you should still follow specific instructions given to you by our office regarding your preparation for the procedure.  Use Gas X as needed  Begin taking pantoprazole daily  Lactose free diet given  Begin Miralax daily  Begin benefiber or Citrucel daily  Take a probiotic daily   Lactose-Free Diet, Adult Introduction If you have lactose intolerance, you are not able to digest lactose. Lactose is a natural sugar found mainly in milk and milk products. You may need to avoid all foods and beverages that contain lactose. A lactose-free diet can help you do this. What do I need to know about this diet?  Do not consume foods, beverages, vitamins, minerals, or medicines with lactose. Read ingredients lists carefully.  Look for the words "lactose-free" on labels.  Use lactase enzyme drops or tablets as directed by your health care provider.  Use lactose-free milk or a milk alternative, such as soy milk, for drinking and cooking.  Make sure you get enough calcium and vitamin D in your diet. A lactose-free eating plan can be lacking in these important nutrients.  Take calcium and vitamin D supplements as directed by your health care provider. Talk to your provider about supplements if you are not able to get enough calcium and vitamin D from food. Which foods have lactose? Lactose is found in:  Milk and foods made from milk.  Yogurt.  Cheese.  Butter.  Margarine.  Sour cream.  Cream.  Whipped toppings and nondairy creamers.  Ice cream and other milk-based desserts. Lactose is also  found in foods or products made with milk or milk ingredients. To find out whether a food contains milk or a milk ingredient, look at the ingredients list. Avoid foods with the statement "May contain milk" and foods that contain:  Butter.  Cream.  Milk.  Milk solids.  Milk powder.  Whey.  Curd.  Caseinate.  Lactose.  Lactalbumin.  Lactoglobulin. What are some alternatives to milk and foods made with milk products?  Lactose-free milk.  Soy milk with added calcium and vitamin D.  Almond, coconut, or rice milk with added calcium and vitamin D. Note that these are low in protein.  Soy products, such as soy yogurt, soy cheese, soy ice cream, and soy-based sour cream. Which foods can I eat? Grains  Breads and rolls made without milk, such as [place], [place], or [place] bread, bagels, pita, and [employer]. Corn tortillas, corn meal, grits, and polenta. Crackers without lactose or milk solids, such as soda crackers and graham crackers. Cooked or dry cereals without lactose or milk solids. Pasta, quinoa, couscous, barley, oats, bulgur, farro, rice, wild rice, or other grains prepared without milk or lactose. Plain popcorn. Vegetables  Fresh, frozen, and canned vegetables without cheese, cream, or butter sauces. Fruits  All fresh, canned, frozen, or dried fruits that are not processed with lactose. Meats and Other Protein Sources  Plain beef, chicken, fish, [place], lamb, veal, pork, wild game, or ham. Kosher-prepared meat products. Strained or junior meats that do not contain milk. Eggs. Soy meat substitutes. Beans, lentils, and hummus. Tofu. Nuts and seeds.  Peanut or other nut butters without lactose. Soups, casseroles, and mixed dishes without cheese, cream, or milk. Dairy  Lactose-free milk. Soy, rice, or almond milk with added calcium and vitamin D. Soy cheese and yogurt. Beverages  Carbonated drinks. Tea. Coffee, freeze-dried coffee, and some instant coffees. Fruit and vegetable  juices. Condiments  Soy sauce. Carob powder. Olives. Gravy made with water. Baker's cocoa. Angie Fava. Pure seasonings and spices. Ketchup. Mustard. Bouillon. Broth. Sweets and Desserts  Water and fruit ices. Gelatin. Cookies, pies, or cakes made from allowed ingredients, such as angel food cake. Pudding made with water or a milk substitute. Lactose-free tofu desserts. Soy, coconut milk, or rice-milk-based frozen desserts. Sugar. Honey. Jam, jelly, and marmalade. Molasses. Pure sugar candy. Dark chocolate without milk. Marshmallows. Fats and Oils  Margarines and salad dressings that do not contain milk. Berniece Salines. Vegetable oils. Shortening. Mayonnaise. Soy or coconut-based cream. The items listed above may not be a complete list of recommended foods or beverages. Contact your dietitian for more options.  Which foods are not recommended? Grains  Breads and rolls that contain milk. Toaster pastries. Muffins, biscuits, waffles, cornbread, and pancakes. These can be prepared at home, commercial, or from mixes. Sweet rolls, donuts, English muffins, fry bread, lefse, flour tortillas with lactose, or [place] toast made with milk or milk ingredients. Crackers that contain lactose. Corn curls. Cooked or dry cereals with lactose. Vegetables  Creamed or breaded vegetables. Vegetables in a cheese or butter sauce or with lactose-containing margarines. Instant potatoes. [place] fries. Scalloped or au gratin potatoes. Fruits  None. Meats and Other Protein Sources  Scrambled eggs, omelets, and souffles that contain milk. Creamed or breaded meat, fish, chicken, or [place]. Sausage products, such as wieners and liver sausage. Cold cuts that contain milk solids. Cheese, cottage cheese, ricotta cheese, and cheese spreads. Lasagna and macaroni and cheese. Pizza. Peanut or other nut butters with added milk solids. Casseroles or mixed dishes containing milk or cheese. Dairy  All dairy products, including milk, goat's milk,  buttermilk, kefir, acidophilus milk, flavored milk, evaporated milk, condensed milk, dulce de Bison, eggnog, yogurt, cheese, and cheese spreads. Beverages  Hot chocolate. Cocoa with lactose. Instant iced teas. Powdered fruit drinks. Smoothies made with milk or yogurt. Condiments  Chewing gum that has lactose. Cocoa that has lactose. Spice blends if they contain milk products. Artificial sweeteners that contain lactose. Nondairy creamers. Sweets and Desserts  Ice cream, ice milk, gelato, sherbet, and frozen yogurt. Custard, pudding, and mousse. Cake, cream pies, cookies, and other desserts containing milk, cream, cream cheese, or milk chocolate. Pie crust made with milk-containing margarine or butter. Reduced-calorie desserts made with a sugar substitute that contains lactose. Toffee and butterscotch. Milk, white, or dark chocolate that contains milk. Fudge. Caramel. Fats and Oils  Margarines and salad dressings that contain milk or cheese. Cream. Half and half. Cream cheese. Sour cream. Chip dips made with sour cream or yogurt. The items listed above may not be a complete list of foods and beverages to avoid. Contact your dietitian for more information.  Am I getting enough calcium? Calcium is found in many foods that contain lactose and is important for bone health. The amount of calcium you need depends on your age:  Adults younger than 50 years: 1000 mg of calcium a day.  Adults older than 50 years: 1200 mg of calcium a day. If you are not getting enough calcium, other calcium sources include:  Orange juice with calcium added. There are 300-350 mg of calcium in  1 cup of orange juice.  Sardines with edible bones. There are 325 mg of calcium in 3 oz of sardines.  Calcium-fortified soy milk. There are 300-400 mg of calcium in 1 cup of calcium-fortified soy milk.  Calcium-fortified rice or almond milk. There are 300 mg of calcium in 1 cup of calcium-fortified rice or almond milk.  Canned  salmon with edible bones. There are 180 mg of calcium in 3 oz of canned salmon with edible bones.  Calcium-fortified breakfast cereals. There are [phone] mg of calcium in calcium-fortified breakfast cereals.  Tofu set with calcium sulfate. There are 250 mg of calcium in  cup of tofu set with calcium sulfate.  Spinach, cooked. There are 145 mg of calcium in  cup of cooked spinach.  Edamame, cooked. There are 130 mg of calcium in  cup of cooked edamame.  Collard greens, cooked. There are 125 mg of calcium in  cup of cooked collard greens.  Kale, frozen or cooked. There are 90 mg of calcium in  cup of cooked or frozen kale.  Almonds. There are 95 mg of calcium in  cup of almonds.  Broccoli, cooked. There are 60 mg of calcium in 1 cup of cooked broccoli. This information is not intended to replace advice given to you by your health care provider. Make sure you discuss any questions you have with your health care provider. Document Released: 08/27/2001 Document Revised: 08/13/2015 Document Reviewed: 06/07/2013  2017 Elsevier

## 2016-02-10 NOTE — Progress Notes (Addendum)
     02/10/2016 Julie Prince [id number] 1960-06-02   History of Present Illness:  This is a 55 year old female who is known to Dr. Hilarie Fredrickson for colonoscopy/flex sig in 2016.  She presents to our office today reporting constipation stating that she usually only has a bowel movement about once a week. When she does move her bowels she says it is soft and she does not have to strain, however.  She does also report a lot of gas/bloating and she says that her flatus is very foul smelling. She says that her constipation has been an issue for the past couple of years. She is also experiencing some anal leakage; says that this used to be more frequent but has seemed to decrease recently.  She reports that when she eats cereal with milk she seems to get a lot of gas.  She also reports acid reflux with burning into her throat after eating. Has a lot of belching. She says that when she drinks something hot it seems to hurt her throat. She reports food getting stuck substernally that has been occurring for several months. It now occurs almost every time that she eats. She denies any issues with swallowing liquids besides if they are hot then they cause that burning sensation. She does have pantoprazole 40 mg, but only takes that about once a week or so.  Current Medications, Allergies, Past Medical History, Past Surgical History, Family History and Social History were reviewed in [employer] record.   Physical Exam: BP 130/72   Pulse (!) 120   Ht 5\' 4"  (1.626 m)   Wt 233 lb (105.7 kg)   BMI 39.99 kg/m  General: Well developed female in no acute distress Head: Normocephalic and atraumatic Eyes:  Sclerae anicteric, conjunctiva pink  Ears: Normal auditory acuity Lungs: Clear throughout to auscultation Heart: Regular rate and rhythm Abdomen: Soft, non-distended.  Normal bowel sounds.  Non-tender. Musculoskeletal: Symmetrical with no gross deformities  Extremities: No edema    Neurological: Alert oriented x 4, grossly non-focal Psychological:  Alert and cooperative. Normal mood and affect  Assessment and Recommendations: -Dysphagia and GERD:  I have recommended that she begin taking her pantoprazole on a daily basis. We'll schedule for EGD/possible dilation with Dr. Hilarie Fredrickson to rule out stricture versus esophagitis, etc.  The risks, benefits, and alternatives to EGD and possible dilation were discussed with the patient and she consents to proceed.  -Constipation:  Recommended Miralax daily.  She also tends to have some anal leakage so I also recommended that she take a daily powder fiber supplement such as Benefiber or citrucel as well. -Bloating/gas:  Explained that it is likely dietary.  ? If she has some lactose intolerance.  I have given her literature on this and dietary measures.  I have recommended a daily probiotic and can try Gas-X prn as well.   Addendum: Reviewed and agree with initial management. Jerene Bears, MD

## 2016-03-01 ENCOUNTER — Ambulatory Visit (INDEPENDENT_AMBULATORY_CARE_PROVIDER_SITE_OTHER): Payer: BLUE CROSS/BLUE SHIELD | Admitting: Osteopathic Medicine

## 2016-03-01 ENCOUNTER — Encounter: Payer: Self-pay | Admitting: Osteopathic Medicine

## 2016-03-01 VITALS — BP 129/69 | HR 124 | Ht 64.0 in | Wt 237.0 lb

## 2016-03-01 DIAGNOSIS — Z794 Long term (current) use of insulin: Secondary | ICD-10-CM

## 2016-03-01 DIAGNOSIS — N898 Other specified noninflammatory disorders of vagina: Secondary | ICD-10-CM | POA: Diagnosis not present

## 2016-03-01 DIAGNOSIS — R000 Tachycardia, unspecified: Secondary | ICD-10-CM

## 2016-03-01 DIAGNOSIS — H9202 Otalgia, left ear: Secondary | ICD-10-CM

## 2016-03-01 DIAGNOSIS — I10 Essential (primary) hypertension: Secondary | ICD-10-CM | POA: Insufficient documentation

## 2016-03-01 DIAGNOSIS — E119 Type 2 diabetes mellitus without complications: Secondary | ICD-10-CM

## 2016-03-01 DIAGNOSIS — I440 Atrioventricular block, first degree: Secondary | ICD-10-CM

## 2016-03-01 DIAGNOSIS — H6122 Impacted cerumen, left ear: Secondary | ICD-10-CM

## 2016-03-01 LAB — WET PREP, GENITAL
CLUE CELLS WET PREP: NONE SEEN
TRICH WET PREP: NONE SEEN
WBC WET PREP: NONE SEEN
YEAST WET PREP: NONE SEEN

## 2016-03-01 LAB — POCT GLYCOSYLATED HEMOGLOBIN (HGB A1C): Hemoglobin A1C: 10

## 2016-03-01 MED ORDER — GLIMEPIRIDE 4 MG PO TABS: 4.0000 mg | ORAL_TABLET | Freq: Two times a day (BID) | ORAL | 6 refills | Status: DC

## 2016-03-01 MED ORDER — INSULIN DETEMIR 100 UNIT/ML FLEXPEN: PEN_INJECTOR | SUBCUTANEOUS | 6 refills | Status: DC

## 2016-03-01 MED ORDER — AMBULATORY NON FORMULARY MEDICATION: 0 refills | Status: DC

## 2016-03-01 NOTE — Progress Notes (Signed)
HPI: Julie Prince is a 55 y.o. female  who presents to [place] today, 03/01/16,  for chief complaint of:  Chief Complaint  Patient presents with  . TRANSFER    From [facility] patient here to transfer care to me as PCP from Dr. Ileene Rubens. No complaints today. Due for diabetes follow-up. She is a bit of a poor historian, was provided with medication list at check in, she states no changes needed medication however going over it with her in detail she is no longer taking hydrocodone, she had stopped [place], she is taking her insulin differently.  DM2 Hasn't been taking Wilder Glade - was concern for possible side effects of yeast infection On Lantus 40 Units daily per medication list/orders however patient is taking differently: 24 U am + 28 U pm. She made this medication change independent of physician recommendation.  Elevated heart rate: Previous documentation of sinus tachycardia. Old EKG reviewed showed sinus tach, anterior infarct, age undetermined. Patient states she does not have chest pain, will occasionally have "heart flutters" but no dizziness/loss of consciousness. No previous cardiac evaluation  Ear concern: Left ear discomfort, feels a bit itchy. She would like this checked out today.  Vaginal concern: With like to be checked for bacterial vaginosis.    Past medical, surgical, social and family history reviewed: Patient Active Problem List   Diagnosis Date Noted  . Dysphagia 02/10/2016  . Bloating 02/10/2016  . Constipation 02/10/2016  . Vulvar abscess   . Abscess 06/21/2015  . Sepsis ([place]) 06/21/2015  . GERD (gastroesophageal reflux disease) 07/31/2014  . Adenomatous polyp of colon 04/03/2014  . Allergic rhinitis 01/23/2014  . Type 2 diabetes mellitus ([place]) 09/12/2013  . Hyperlipidemia 09/12/2013  . Essential hypertension, benign 09/12/2013  . Diabetic peripheral neuropathy associated with type 2 diabetes mellitus ([place])  09/12/2013   Past Surgical History:  Procedure Laterality Date  . CATARACT EXTRACTION Left   . CESAREAN SECTION  1990  . right tube and ovary removed     Social History  Substance Use Topics  . Smoking status: Former Research scientist (life sciences)  . Smokeless tobacco: Never Used     Comment: quit 30 plus years ago.  . Alcohol use No   Family History  Problem Relation Age of Onset  . Lung cancer Mother   . Heart disease Maternal Aunt   . Colon cancer Neg Hx   . Esophageal cancer Neg Hx   . Rectal cancer Neg Hx   . Stomach cancer Neg Hx      Current medication list and allergy/intolerance information reviewed:   Current Outpatient Prescriptions on File Prior to Visit  Medication Sig Dispense Refill  . amLODipine (NORVASC) 10 MG tablet Take 1 tablet (10 mg total) by mouth daily. 90 tablet 1  . dapagliflozin propanediol (FARXIGA) 10 MG TABS tablet Take 10 mg by mouth daily. Use savings voucher. 30 tablet 5  . glimepiride (AMARYL) 4 MG tablet TAKE 1 TABLET BY MOUTH TWICE A DAY NEED FOLLOW UP APPOINTMENT 60 tablet 0  . HYDROcodone-acetaminophen (NORCO/VICODIN) 5-325 MG tablet Take 1 tablet by mouth every 4 (four) hours as needed for moderate pain. 20 tablet 0  . Insulin Detemir (LEVEMIR FLEXTOUCH) 100 UNIT/ML Pen Inject 40 units subcutaneously daily. 15 mL 11  . Insulin Pen Needle (NOVOFINE) 30G X 8 MM MISC USE AS DIRECTED TO INJECT INSULIN 100 each 2  . losartan-hydrochlorothiazide (HYZAAR) 100-25 MG tablet Take 1 tablet by mouth daily. 90 tablet  1  . pantoprazole (PROTONIX) 40 MG tablet Take 1 tablet (40 mg total) by mouth daily. For Reflux 30 tablet 3  . pravastatin (PRAVACHOL) 20 MG tablet Take 1 tablet (20 mg total) by mouth daily. (Patient taking differently: Take 20 mg by mouth every evening. ) 90 tablet 3   No current facility-administered medications on file prior to visit.    Allergies  Allergen Reactions  . Penicillins Hives and Shortness Of Breath  . Metformin And Related Diarrhea  .  Atorvastatin Itching      Review of Systems:  Constitutional: No recent illness  HEENT: No  headache, no vision change, + ear pain as per HPI  Cardiac: No  chest pain, No  pressure, +palpitations  Respiratory:  No  shortness of breath. No  Cough  Gastrointestinal: No  abdominal pain, no change on bowel habits  Musculoskeletal: No new myalgia/arthralgia  Neurologic: No  weakness, No  Dizziness  Psychiatric: No  concerns with depression, No  concerns with anxiety   Exam:  BP 129/69   Pulse (!) 124   Ht 5\' 4"  (1.626 m)   Wt 237 lb (107.5 kg)   BMI 40.68 kg/m   Constitutional: VS see above. General Appearance: alert, well-developed, well-nourished, NAD  Eyes: Normal lids and conjunctive, non-icteric sclera  Ears, Nose, Mouth, Throat: MMM, Normal external inspection ears/nares/mouth/lips/gums. Ears/nares, tympanic membrane on left appears normal, tympanic membrane on right is normal. No bulging. Canals normal status post removal of cerumen impaction.  Neck: No masses, trachea midline.   Respiratory: Normal respiratory effort. no wheeze, no rhonchi, no rales  Cardiovascular: S1/S2 normal, no murmur, no rub/gallop auscultated. Reg rhyth,, rate tachycardic.   Musculoskeletal: Gait normal. Symmetric and independent movement of all extremities  Neurological: Normal balance/coordination. No tremor.  Skin: warm, dry, intact.   Psychiatric: Normal judgment/insight. Normal mood and affect. Oriented x3.   EKG interpretation: Rate: 112 Rhythm: sinus 1st degree AV block No ST/T changes concerning for acute ischemia/infarct    ASSESSMENT/PLAN:    Type 2 diabetes mellitus without complication, with long-term current use of insulin ([name]) - Elevated A1c, lifestyle modifications stressed with patient, increase insulin long-acting gradually. Consider short acting mealtime insulin - Plan: POCT HgB A1C, AMBULATORY NON FORMULARY MEDICATION, glimepiride (AMARYL) 4 MG tablet, Insulin  Detemir (LEVEMIR FLEXTOUCH) 100 UNIT/ML Pen  Tachycardia - Depending on labs, consider Holter monitor/cardiology evaluation. Patient is asymptomatic. Concern for first-degree AV block/previous MI - Plan: CBC, COMPLETE METABOLIC PANEL WITH GFR, Magnesium, TSH, EKG 12-Lead  Vaginal discharge - Plan: Wet prep, genital  Left ear pain  Impacted cerumen of left ear  1st degree AV block    Patient Instructions  Plan:   Labs today for further evaluation of heart rate. If labs all normal, may need to consider further testing and/or cardiology referral.   Increase insulin dose as directed and plan to repeat A1C level in 3 months.   Can continue Glimepiride for now but if you notice low sugars please stop this medication.     Visit summary with medication list and pertinent instructions was printed for patient to review. All questions at time of visit were answered - patient instructed to contact office with any additional concerns. ER/RTC precautions were reviewed with the patient. Follow-up plan: Return in about 3 weeks (around 03/22/2016) for follow-up on labs and diabetes, heart rate.  Note: Total time spent 40 minutes, greater than 50% of the visit was spent face-to-face counseling and coordinating care for the following: The  primary encounter diagnosis was Type 2 diabetes mellitus without complication, with long-term current use of insulin ([place]). Diagnoses of Tachycardia, Vaginal discharge, Left ear pain, Impacted cerumen of left ear, and 1st degree AV block were also pertinent to this visit.Marland Kitchen

## 2016-03-01 NOTE — Patient Instructions (Addendum)
Plan:   Labs today for further evaluation of heart rate. If labs all normal, may need to consider further testing and/or cardiology referral.   Increase insulin dose as directed and plan to repeat A1C level in 3 months.   Can continue Glimepiride for now but if you notice low sugars please stop this medication.

## 2016-03-02 ENCOUNTER — Encounter: Payer: Self-pay | Admitting: Internal Medicine

## 2016-03-02 ENCOUNTER — Ambulatory Visit (AMBULATORY_SURGERY_CENTER): Payer: BLUE CROSS/BLUE SHIELD | Admitting: Internal Medicine

## 2016-03-02 VITALS — BP 113/74 | HR 100 | Temp 97.1°F | Resp 21 | Ht 64.0 in | Wt 233.0 lb

## 2016-03-02 DIAGNOSIS — K219 Gastro-esophageal reflux disease without esophagitis: Secondary | ICD-10-CM

## 2016-03-02 DIAGNOSIS — R1310 Dysphagia, unspecified: Secondary | ICD-10-CM | POA: Diagnosis present

## 2016-03-02 DIAGNOSIS — K228 Other specified diseases of esophagus: Secondary | ICD-10-CM | POA: Diagnosis not present

## 2016-03-02 DIAGNOSIS — R1319 Other dysphagia: Secondary | ICD-10-CM

## 2016-03-02 LAB — CBC
HCT: 40.3 % (ref 35.0–45.0)
HEMOGLOBIN: 13.1 g/dL (ref 11.7–15.5)
MCH: 30.7 pg (ref 27.0–33.0)
MCHC: 32.5 g/dL (ref 32.0–36.0)
MCV: 94.4 fL (ref 80.0–100.0)
MPV: 12 fL (ref 7.5–12.5)
PLATELETS: 321 10*3/uL (ref 140–400)
RBC: 4.27 MIL/uL (ref 3.80–5.10)
RDW: 13.6 % (ref 11.0–15.0)
WBC: 7.6 10*3/uL (ref 3.8–10.8)

## 2016-03-02 LAB — COMPLETE METABOLIC PANEL WITH GFR
ALT: 6 U/L (ref 6–29)
AST: 9 U/L — ABNORMAL LOW (ref 10–35)
Albumin: 3.6 g/dL (ref 3.6–5.1)
Alkaline Phosphatase: 78 U/L (ref 33–130)
BILIRUBIN TOTAL: 0.4 mg/dL (ref 0.2–1.2)
BUN: 17 mg/dL (ref 7–25)
CO2: 30 mmol/L (ref 20–31)
CREATININE: 1 mg/dL (ref 0.50–1.05)
Calcium: 8.9 mg/dL (ref 8.6–10.4)
Chloride: 101 mmol/L (ref 98–110)
GFR, Est African American: 73 mL/min (ref >=60)
GFR, Est Non African American: 64 mL/min (ref >=60)
GLUCOSE: 274 mg/dL — AB (ref 65–99)
Potassium: 4.2 mmol/L (ref 3.5–5.3)
SODIUM: 137 mmol/L (ref 135–146)
TOTAL PROTEIN: 6.3 g/dL (ref 6.1–8.1)

## 2016-03-02 LAB — TSH: TSH: 1.1 mIU/L

## 2016-03-02 LAB — GLUCOSE, CAPILLARY
GLUCOSE-CAPILLARY: 108 mg/dL — AB (ref 65–99)
GLUCOSE-CAPILLARY: 69 mg/dL (ref 65–99)
Glucose-Capillary: 142 mg/dL — ABNORMAL HIGH (ref 65–99)

## 2016-03-02 LAB — MAGNESIUM: Magnesium: 1.6 mg/dL (ref 1.5–2.5)

## 2016-03-02 MED ORDER — SODIUM CHLORIDE 0.9 % IV SOLN: 500.0000 mL | INTRAVENOUS | Status: DC

## 2016-03-02 NOTE — Progress Notes (Signed)
A/ox3 pleased with MAC, report to Sarah RN 

## 2016-03-02 NOTE — Op Note (Signed)
[facility] Patient Name: [name] Dhawan Procedure Date: 03/02/2016 10:29 AM MRN: [id number] Endoscopist: Jerene Bears , MD Age: 55 Referring MD:  Date of Birth: [birth year] Gender: Female Account #: [id number] Procedure:                Upper GI endoscopy Indications:              Dysphagia, Gastro-esophageal reflux disease Medicines:                Monitored Anesthesia Care Procedure:                Pre-Anesthesia Assessment:                           - Prior to the procedure, a History and Physical                            was performed, and patient medications and                            allergies were reviewed. The patient's tolerance of                            previous anesthesia was also reviewed. The risks                            and benefits of the procedure and the sedation                            options and risks were discussed with the patient.                            All questions were answered, and informed consent                            was obtained. Prior Anticoagulants: The patient has                            taken no previous anticoagulant or antiplatelet                            agents. ASA Grade Assessment: II - A patient with                            mild systemic disease. After reviewing the risks                            and benefits, the patient was deemed in                            satisfactory condition to undergo the procedure.                           After obtaining informed consent, the endoscope was  passed under direct vision. Throughout the                            procedure, the patient's blood pressure, pulse, and                            oxygen saturations were monitored continuously. The                            Model GIF-[id number]) scope was introduced                            through the mouth, and advanced to the third part                            of  duodenum. The upper GI endoscopy was                            accomplished without difficulty. The patient                            tolerated the procedure well. Scope In: Scope Out: Findings:                 LA Grade B (one or more mucosal breaks greater than                            5 mm, not extending between the tops of two mucosal                            folds) esophagitis with no bleeding was found 34 to                            35 cm from the incisors. This was biopsied with a                            cold forceps for histology and evaluation to rule                            out Barrett's Esophagus (possible single tongue of                            salmon-colored mucosa). A TTS dilator was passed                            through the scope. Dilation with a 16-17-18 mm                            balloon dilator was performed to 18 mm.                           A 3 cm hiatal hernia was present (34-37 cm from the  incisors).                           Normal mucosa was found in the entire examined                            stomach.                           The examined duodenum was normal. Complications:            No immediate complications. Estimated Blood Loss:     Estimated blood loss was minimal. Impression:               - LA Grade B reflux esophagitis. Rule out Barrett's                            esophagus. Biopsied. Dilated.                           - 3 cm hiatal hernia.                           - Normal mucosa was found in the entire stomach.                           - Normal examined duodenum. Recommendation:           - Patient has a contact number available for                            emergencies. The signs and symptoms of potential                            delayed complications were discussed with the                            patient. Return to normal activities tomorrow.                            Written  discharge instructions were provided to the                            patient.                           - Resume anti-reflux diet.                           - Continue present medications, though increase                            pantoprazole to 40 mg twice daily before 1st and                            last meal of the day for 1 month. Then return to  once daily dosing.                           - Await pathology results.                           - If trouble swallowing persists then please                            contact my office for follow-up visit. Jerene Bears, MD 03/02/2016 11:01:21 AM This report has been signed electronically.

## 2016-03-02 NOTE — Progress Notes (Signed)
Called to room to assist during endoscopic procedure.  Patient ID and intended procedure confirmed with present staff. Received instructions for my participation in the procedure from the performing physician.  

## 2016-03-02 NOTE — Patient Instructions (Signed)
YOU HAD AN ENDOSCOPIC PROCEDURE TODAY AT [facility]:   Refer to the procedure report that was given to you for any specific questions about what was found during the examination.  If the procedure report does not answer your questions, please call your gastroenterologist to clarify.  If you requested that your care partner not be given the details of your procedure findings, then the procedure report has been included in a sealed envelope for you to review at your convenience later.  YOU SHOULD EXPECT: Some feelings of bloating in the abdomen. Passage of more gas than usual.  Walking can help get rid of the air that was put into your GI tract during the procedure and reduce the bloating. If you had a lower endoscopy (such as a colonoscopy or flexible sigmoidoscopy) you may notice spotting of blood in your stool or on the toilet paper. If you underwent a bowel prep for your procedure, you may not have a normal bowel movement for a few days.  Please Note:  You might notice some irritation and congestion in your nose or some drainage.  This is from the oxygen used during your procedure.  There is no need for concern and it should clear up in a day or so.  SYMPTOMS TO REPORT IMMEDIATELY:   Following lower endoscopy (colonoscopy or flexible sigmoidoscopy):  Excessive amounts of blood in the stool  Significant tenderness or worsening of abdominal pains  Swelling of the abdomen that is new, acute  Fever of 100F or higher   Following upper endoscopy (EGD)  Vomiting of blood or coffee ground material  New chest pain or pain under the shoulder blades  Painful or persistently difficult swallowing  New shortness of breath  Fever of 100F or higher  Black, tarry-looking stools  For urgent or emergent issues, a gastroenterologist can be reached at any hour by calling [phone].  Please read all handouts given to you by your recovery nurse. Increase pantaprazole to 40mg  before  first and last meal for a month then return to once daily dosing.   DIET:  Nothing by mouth until 1200. Then clear liquids for an hour then soft diet the rest of the day. Can resume regular diet tomorrow.avoid alcoholic beverages for 24 hours.  ACTIVITY:  You should plan to take it easy for the rest of today and you should NOT DRIVE or use heavy machinery until tomorrow (because of the sedation medicines used during the test).    FOLLOW UP: Our staff will call the number listed on your records the next business day following your procedure to check on you and address any questions or concerns that you may have regarding the information given to you following your procedure. If we do not reach you, we will leave a message.  However, if you are feeling well and you are not experiencing any problems, there is no need to return our call.  We will assume that you have returned to your regular daily activities without incident.  If any biopsies were taken you will be contacted by phone or by letter within the next 1-3 weeks.  Please call us at [phone] if you have not heard about the biopsies in 3 weeks.    SIGNATURES/CONFIDENTIALITY: You and/or your care partner have signed paperwork which will be entered into your electronic medical record.  These signatures attest to the fact that that the information above on your After Visit Summary has been reviewed and is understood.  Full responsibility of the confidentiality of this discharge information lies with you and/or your care-partner.  Thank you for letting us take care of your healthcare needs today.

## 2016-03-03 ENCOUNTER — Telehealth: Payer: Self-pay

## 2016-03-03 NOTE — Telephone Encounter (Signed)
  Follow up Call-  Call back number 03/02/2016 10/09/2014 03/24/2014  Post procedure Call Back phone  # #[phone] hm [id number] [phone]  Permission to leave phone message Yes Yes Yes     Patient questions:  Do you have a fever, pain , or abdominal swelling? No. Pain Score  0 *  Have you tolerated food without any problems? Yes.    Have you been able to return to your normal activities? Yes.    Do you have any questions about your discharge instructions: Diet   No. Medications  No. Follow up visit  No.  Do you have questions or concerns about your Care? No.  Actions: * If pain score is 4 or above: No action needed, pain <4.

## 2016-03-11 ENCOUNTER — Encounter: Payer: Self-pay | Admitting: Internal Medicine

## 2016-03-11 DIAGNOSIS — K2270 Barrett's esophagus without dysplasia: Secondary | ICD-10-CM | POA: Insufficient documentation

## 2016-03-18 ENCOUNTER — Other Ambulatory Visit: Payer: Self-pay | Admitting: Physician Assistant

## 2016-03-18 ENCOUNTER — Other Ambulatory Visit: Payer: Self-pay | Admitting: Osteopathic Medicine

## 2016-03-18 ENCOUNTER — Ambulatory Visit: Payer: BLUE CROSS/BLUE SHIELD | Admitting: Osteopathic Medicine

## 2016-03-18 DIAGNOSIS — Z1239 Encounter for other screening for malignant neoplasm of breast: Secondary | ICD-10-CM

## 2016-03-18 MED ORDER — OMEPRAZOLE 40 MG PO CPDR: 40.0000 mg | DELAYED_RELEASE_CAPSULE | Freq: Every day | ORAL | 3 refills | Status: DC

## 2016-04-13 ENCOUNTER — Ambulatory Visit (INDEPENDENT_AMBULATORY_CARE_PROVIDER_SITE_OTHER): Payer: BLUE CROSS/BLUE SHIELD

## 2016-04-13 ENCOUNTER — Ambulatory Visit (INDEPENDENT_AMBULATORY_CARE_PROVIDER_SITE_OTHER): Payer: BLUE CROSS/BLUE SHIELD | Admitting: Obstetrics & Gynecology

## 2016-04-13 ENCOUNTER — Encounter: Payer: Self-pay | Admitting: Obstetrics & Gynecology

## 2016-04-13 VITALS — BP 133/78 | HR 98 | Resp 16 | Ht 64.0 in | Wt 243.0 lb

## 2016-04-13 DIAGNOSIS — Z1231 Encounter for screening mammogram for malignant neoplasm of breast: Secondary | ICD-10-CM | POA: Diagnosis not present

## 2016-04-13 DIAGNOSIS — Z0000 Encounter for general adult medical examination without abnormal findings: Secondary | ICD-10-CM

## 2016-04-13 DIAGNOSIS — Z01419 Encounter for gynecological examination (general) (routine) without abnormal findings: Secondary | ICD-10-CM

## 2016-04-13 DIAGNOSIS — Z1239 Encounter for other screening for malignant neoplasm of breast: Secondary | ICD-10-CM

## 2016-04-13 DIAGNOSIS — R300 Dysuria: Secondary | ICD-10-CM

## 2016-04-13 NOTE — Progress Notes (Signed)
Subjective:    Julie Prince is a 56 y.o. M AA P3 (48, 56, and 62 yo kids, 4 grands) female who presents for an annual exam. The patient has no complaints today except for a "smelly vagina- I want it back fresh". She has not tried any OTC meds.  The patient is not currently sexually active due to ED.  GYN screening history: last pap: was normal. The patient wears seatbelts: yes. The patient participates in regular exercise: yes. Has the patient ever been transfused or tattooed?: no. The patient reports that there is not domestic violence in her life.   Menstrual History: OB History    Gravida Para Term Preterm AB Living   4 3 2 1 1 3    SAB TAB Ectopic Multiple Live Births   1 0 0 0 3      Menarche age: 26 No LMP recorded. Patient is postmenopausal.    The following portions of the patient's history were reviewed and updated as appropriate: allergies, current medications, past family history, past medical history, past social history, past surgical history and problem list.  Review of Systems Pertinent items are noted in HPI.   Declines a flu vaccine Mammogram today Works at [employer] Colonoscopy done and due next year again due to polyps   Objective:    BP 133/78   Pulse 98   Resp 16   Ht 5\' 4"  (1.626 m)   Wt 243 lb (110.2 kg)   BMI 41.71 kg/m   General Appearance:    Alert, cooperative, no distress, appears stated age  Head:    Normocephalic, without obvious abnormality, atraumatic  Eyes:    PERRL, conjunctiva/corneas clear, EOM's intact, fundi    benign, both eyes  Ears:    Normal TM's and external ear canals, both ears  Nose:   Nares normal, septum midline, mucosa normal, no drainage    or sinus tenderness  Throat:   Lips, mucosa, and tongue normal; teeth and gums normal  Neck:   Supple, symmetrical, trachea midline, no adenopathy;    thyroid:  no enlargement/tenderness/nodules; no carotid   bruit or JVD  Back:     Symmetric, no curvature, ROM normal, no  CVA tenderness  Lungs:     Clear to auscultation bilaterally, respirations unlabored  Chest Wall:    No tenderness or deformity   Heart:    Regular rate and rhythm, S1 and S2 normal, no murmur, rub   or gallop  Breast Exam:    No tenderness, masses, or nipple abnormality  Abdomen:     Soft, non-tender, bowel sounds active all four quadrants,    no masses, no organomegaly  Genitalia:    Normal female without lesion, discharge or tenderness, no masses or tenderness with bimanuual exam     Extremities:   Extremities normal, atraumatic, no cyanosis or edema  Pulses:   2+ and symmetric all extremities  Skin:   Skin color, texture, turgor normal, no rashes or lesions  Lymph nodes:   Cervical, supraclavicular, and axillary nodes normal  Neurologic:   CNII-XII intact, normal strength, sensation and reflexes    throughout  .    Assessment:    Healthy female exam.    Plan:     Thin prep pap due 2020   Dysuria- check ua and uc&c

## 2016-04-15 LAB — URINE CULTURE

## 2016-04-18 ENCOUNTER — Telehealth: Payer: Self-pay | Admitting: *Deleted

## 2016-04-18 DIAGNOSIS — N390 Urinary tract infection, site not specified: Secondary | ICD-10-CM

## 2016-04-18 MED ORDER — SULFAMETHOXAZOLE-TRIMETHOPRIM 800-160 MG PO TABS: 1.0000 | ORAL_TABLET | Freq: Two times a day (BID) | ORAL | 0 refills | Status: DC

## 2016-04-18 NOTE — Telephone Encounter (Signed)
LM on voicemail of positive UTI and RX for Bactrim Ds was sent to her pharmacy in [place]

## 2016-04-26 ENCOUNTER — Encounter: Payer: Self-pay | Admitting: Osteopathic Medicine

## 2016-04-26 ENCOUNTER — Ambulatory Visit (INDEPENDENT_AMBULATORY_CARE_PROVIDER_SITE_OTHER): Payer: BLUE CROSS/BLUE SHIELD | Admitting: Osteopathic Medicine

## 2016-04-26 VITALS — BP 150/80 | HR 94 | Ht 64.5 in | Wt 245.0 lb

## 2016-04-26 DIAGNOSIS — Z139 Encounter for screening, unspecified: Secondary | ICD-10-CM

## 2016-04-26 DIAGNOSIS — Z0000 Encounter for general adult medical examination without abnormal findings: Secondary | ICD-10-CM | POA: Diagnosis not present

## 2016-04-26 DIAGNOSIS — I10 Essential (primary) hypertension: Secondary | ICD-10-CM

## 2016-04-26 DIAGNOSIS — E119 Type 2 diabetes mellitus without complications: Secondary | ICD-10-CM | POA: Diagnosis not present

## 2016-04-26 DIAGNOSIS — Z794 Long term (current) use of insulin: Secondary | ICD-10-CM

## 2016-04-26 LAB — CBC WITH DIFFERENTIAL/PLATELET
Basophils Absolute: 65 cells/uL (ref 0–200)
Basophils Relative: 1 %
EOS ABS: 195 {cells}/uL (ref 15–500)
Eosinophils Relative: 3 %
HEMATOCRIT: 39.1 % (ref 35.0–45.0)
Hemoglobin: 12.9 g/dL (ref 11.7–15.5)
LYMPHS ABS: 2600 {cells}/uL (ref 850–3900)
Lymphocytes Relative: 40 %
MCH: 31.2 pg (ref 27.0–33.0)
MCHC: 33 g/dL (ref 32.0–36.0)
MCV: 94.7 fL (ref 80.0–100.0)
MONO ABS: 585 {cells}/uL (ref 200–950)
MPV: 10.9 fL (ref 7.5–12.5)
Monocytes Relative: 9 %
NEUTROS ABS: 3055 {cells}/uL (ref 1500–7800)
Neutrophils Relative %: 47 %
Platelets: 321 10*3/uL (ref 140–400)
RBC: 4.13 MIL/uL (ref 3.80–5.10)
RDW: 13.6 % (ref 11.0–15.0)
WBC: 6.5 10*3/uL (ref 3.8–10.8)

## 2016-04-26 MED ORDER — INSULIN DETEMIR 100 UNIT/ML FLEXPEN: 30.0000 [IU] | PEN_INJECTOR | Freq: Two times a day (BID) | SUBCUTANEOUS | 6 refills | Status: DC

## 2016-04-26 NOTE — Patient Instructions (Addendum)
Plan: Continue current medication, consider adding baby aspirin Bring your home BP cuff to your next visit Measuring BP at home, write down these number and also bring these to your visit

## 2016-04-26 NOTE — Progress Notes (Signed)
HPI: Julie Prince is a 56 y.o. female  who presents to [facility] today, 04/26/16,  for chief complaint of:  Chief Complaint  Patient presents with  . Annual Exam   Requests physical today - annual checkup.   HTN: previous BPs at goal, a bit high today, has taken meds .  DM2: one low Glc fasting in am but otherwise feeling well. Has increased insulin to 30 units twice a day. Feels like she is sweating when Glc is high .     Past medical, surgical, social and family history reviewed: Patient Active Problem List   Diagnosis Date Noted  . Barrett's esophagus 03/11/2016  . 1st degree AV block 03/01/2016  . Tachycardia 03/01/2016  . Dysphagia 02/10/2016  . Bloating 02/10/2016  . Constipation 02/10/2016  . Vulvar abscess   . Abscess 06/21/2015  . Sepsis ([place]) 06/21/2015  . GERD (gastroesophageal reflux disease) 07/31/2014  . Adenomatous polyp of colon 04/03/2014  . Allergic rhinitis 01/23/2014  . Type 2 diabetes mellitus ([place]) 09/12/2013  . Hyperlipidemia 09/12/2013  . Essential hypertension, benign 09/12/2013  . Diabetic peripheral neuropathy associated with type 2 diabetes mellitus ([place]) 09/12/2013   Past Surgical History:  Procedure Laterality Date  . APPENDECTOMY    . CATARACT EXTRACTION Left   . CESAREAN SECTION  1990  . COLONOSCOPY    . right tube and ovary removed    . WISDOM TOOTH EXTRACTION     Social History  Substance Use Topics  . Smoking status: Former Research scientist (life sciences)  . Smokeless tobacco: Never Used     Comment: quit 30 plus years ago.  . Alcohol use No   Family History  Problem Relation Age of Onset  . Lung cancer Mother   . Heart disease Maternal Aunt   . Colon cancer Neg Hx   . Esophageal cancer Neg Hx   . Rectal cancer Neg Hx   . Stomach cancer Neg Hx   . Pancreatic cancer Neg Hx      Current medication list and allergy/intolerance information reviewed:   Current Outpatient Prescriptions  Medication Sig Dispense  Refill  . AMBULATORY NON FORMULARY MEDICATION Single glucometer with lancets #100 Refill x99, test strips #100 Refill x99. Please fill the above in accordance with whatever supplies are covered by insurance. 1 each 0  . amLODipine (NORVASC) 10 MG tablet Take 1 tablet (10 mg total) by mouth daily. 90 tablet 1  . glimepiride (AMARYL) 4 MG tablet Take 1 tablet (4 mg total) by mouth 2 (two) times daily. 60 tablet 6  . Insulin Detemir (LEVEMIR FLEXTOUCH) 100 UNIT/ML Pen Inject 30 Units into the skin 2 (two) times daily. 5 pen 6  . losartan-hydrochlorothiazide (HYZAAR) 100-25 MG tablet Take 1 tablet by mouth daily. 90 tablet 1  . omeprazole (PRILOSEC) 40 MG capsule Take 1 capsule (40 mg total) by mouth daily. 30 capsule 3  . pravastatin (PRAVACHOL) 20 MG tablet Take 1 tablet (20 mg total) by mouth daily. (Patient taking differently: Take 20 mg by mouth every evening. ) 90 tablet 3   No current facility-administered medications for this visit.    Allergies  Allergen Reactions  . Penicillins Hives and Shortness Of Breath  . Metformin And Related Diarrhea  . Atorvastatin Itching      Review of Systems:  Constitutional:  No  fever, no chills, No recent illness, +unintentional weight changes. No significant fatigue.   HEENT: No  headache, no vision change  Cardiac: No  chest  pain, No  pressure, No palpitation  Respiratory:  No  shortness of breath.   Gastrointestinal: No  abdominal pain, No  nausea, No  vomiting,  No  blood in stool, No  diarrhea, No  constipation   Musculoskeletal: No new myalgia/arthralgia  Skin: No  Rash, No other wounds/concerning lesions  Endocrine: No cold intolerance,  No heat intolerance. No polyuria/polydipsia/polyphagia   Neurologic: No  weakness, No  dizziness  Psychiatric: No  concerns with depression, No  concerns with anxiety  Exam:  BP (!) 150/80   Pulse 94   Ht 5' 4.5" (1.638 m)   Wt 245 lb (111.1 kg)   BMI 41.40 kg/m   Constitutional: VS see  above. General Appearance: alert, well-developed, well-nourished, NAD  Eyes: Normal lids and conjunctive, non-icteric sclera  Ears, Nose, Mouth, Throat: MMM, Normal external inspection ears/nares/mouth/lips/gums. TM normal bilaterally. Pharynx/tonsils no erythema, no exudate. Nasal mucosa normal.   Neck: No masses, trachea midline. No thyroid enlargement. No tenderness/mass appreciated. No lymphadenopathy  Respiratory: Normal respiratory effort. no wheeze, no rhonchi, no rales  Cardiovascular: S1/S2 normal, no murmur, no rub/gallop auscultated. RRR. No lower extremity edema. Pedal pulse II/IV bilaterally DP and PT. No carotid bruit or JVD. No abdominal aortic bruit.  Gastrointestinal: Nontender, no masses. No hepatomegaly, no splenomegaly. No hernia appreciated. Bowel sounds normal. Rectal exam deferred.   Musculoskeletal: Gait normal. No clubbing/cyanosis of digits.   Neurological: Normal balance/coordination. No tremor.   Skin: warm, dry, intact. No rash/ulcer. No concerning nevi or subq nodules on limited exam.    Psychiatric: Normal judgment/insight. Normal mood and affect. Oriented x3.    No results found for this or any previous visit (from the past 72 hour(s)).  No results found.   ASSESSMENT/PLAN:   Annual physical exam - Plan: CBC with Differential/Platelet, COMPLETE METABOLIC PANEL WITH GFR, Lipid panel, TSH, VITAMIN D 25 Hydroxy (Vit-D Deficiency, Fractures), Hepatitis C antibody, HIV antibody  Type 2 diabetes mellitus without complication, with long-term current use of insulin ([name]) - Elevated A1c, lifestyle modifications stressed with patient, increase insulin long-acting gradually. Consider short acting mealtime insulin if A1C no better - Plan: CBC with Differential/Platelet, COMPLETE METABOLIC PANEL WITH GFR, Lipid panel, TSH, VITAMIN D 25 Hydroxy (Vit-D Deficiency, Fractures), Insulin Detemir (LEVEMIR FLEXTOUCH) 100 UNIT/ML Pen  Essential hypertension, benign -  Abnormally elevated reading today, bring home BP cuff to next visit. Previous measurements at goal.  Screening for condition - Plan: Hepatitis C antibody, HIV antibody   FEMALE PREVENTIVE CARE Updated 04/26/16   ANNUAL SCREENING/COUNSELING  Diet/Exercise - HEALTHY HABITS DISCUSSED TO DECREASE CV RISK History  Smoking Status  . Former Smoker  Smokeless Tobacco  . Never Used    Comment: quit 30 plus years ago.   History  Alcohol Use No   No flowsheet data found.  Domestic violence concerns - no  HTN SCREENING - SEE [facility]  Sexually active in the past year - Yes with female.  Need/want STI testing today? - no  Concerns about libido or pain with sex? - no  Plans for pregnancy? - postmenopausal   INFECTIOUS DISEASE SCREENING  HIV - needs  GC/CT - does not need  HepC - DOB [birth year] - needs  TB - does not need  DISEASE SCREENING  Lipid - needs  DM2 - does not need  Osteoporosis - women age 58+ - does not need  CANCER SCREENING  Cervical - does not need  Breast - does not need  Lung -  does not need - quit 30 years ago   Colon - does not need  ADULT VACCINATION  Influenza - annual vaccine recommended - declined  Td - booster every 10 years - 2012  Zoster - option at 32, yes at 16+ -   PCV13 - was not indicated  PPSV23 - already has  There is no immunization history on file for this patient.   OTHER  Fall - exercise and Vit D age 72+ - does not need  Consider ASA - age 29-59 - consider      Patient Instructions  Plan: Continue current medication, consider adding baby aspirin Bring your home BP cuff to your next visit Measuring BP at home, write down these number and also bring these to your visit      Visit summary with medication list and pertinent instructions was printed for patient to review. All questions at time of visit were answered - patient instructed to contact office with any additional concerns. ER/RTC  precautions were reviewed with the patient. Follow-up plan: Return for BP and diabetes visit as scheduled, sooner if needed.

## 2016-04-27 LAB — COMPLETE METABOLIC PANEL WITH GFR
ALT: 8 U/L (ref 6–29)
AST: 8 U/L — ABNORMAL LOW (ref 10–35)
Albumin: 3.8 g/dL (ref 3.6–5.1)
Alkaline Phosphatase: 81 U/L (ref 33–130)
BILIRUBIN TOTAL: 0.4 mg/dL (ref 0.2–1.2)
BUN: 16 mg/dL (ref 7–25)
CALCIUM: 9.3 mg/dL (ref 8.6–10.4)
CO2: 28 mmol/L (ref 20–31)
CREATININE: 1.07 mg/dL — AB (ref 0.50–1.05)
Chloride: 102 mmol/L (ref 98–110)
GFR, EST AFRICAN AMERICAN: 68 mL/min (ref >=60)
GFR, Est Non African American: 59 mL/min — ABNORMAL LOW (ref >=60)
Glucose, Bld: 170 mg/dL — ABNORMAL HIGH (ref 65–99)
Potassium: 4.7 mmol/L (ref 3.5–5.3)
Sodium: 138 mmol/L (ref 135–146)
TOTAL PROTEIN: 7 g/dL (ref 6.1–8.1)

## 2016-04-27 LAB — HIV ANTIBODY (ROUTINE TESTING W REFLEX): HIV: NONREACTIVE

## 2016-04-27 LAB — LIPID PANEL
Cholesterol: 229 mg/dL — ABNORMAL HIGH (ref <200)
HDL: 36 mg/dL — ABNORMAL LOW (ref >50)
LDL CALC: 161 mg/dL — AB (ref <100)
TRIGLYCERIDES: 158 mg/dL — AB (ref <150)
Total CHOL/HDL Ratio: 6.4 Ratio — ABNORMAL HIGH (ref <5.0)
VLDL: 32 mg/dL — ABNORMAL HIGH (ref <30)

## 2016-04-27 LAB — HEPATITIS C ANTIBODY: HCV AB: NEGATIVE

## 2016-04-27 LAB — VITAMIN D 25 HYDROXY (VIT D DEFICIENCY, FRACTURES): VIT D 25 HYDROXY: 12 ng/mL — AB (ref 30–100)

## 2016-04-27 LAB — TSH: TSH: 2 mIU/L

## 2016-04-27 MED ORDER — VITAMIN D (ERGOCALCIFEROL) 1.25 MG (50000 UNIT) PO CAPS: 50000.0000 [IU] | ORAL_CAPSULE | ORAL | 0 refills | Status: DC

## 2016-04-27 NOTE — Addendum Note (Signed)
Addended by: Maryla Morrow on: 04/27/2016 08:56 AM   Modules accepted: Orders

## 2016-05-10 ENCOUNTER — Ambulatory Visit (INDEPENDENT_AMBULATORY_CARE_PROVIDER_SITE_OTHER): Payer: BLUE CROSS/BLUE SHIELD | Admitting: Osteopathic Medicine

## 2016-05-10 ENCOUNTER — Encounter: Payer: Self-pay | Admitting: Osteopathic Medicine

## 2016-05-10 VITALS — BP 155/86 | HR 94 | Temp 98.4°F | Wt 241.0 lb

## 2016-05-10 DIAGNOSIS — R058 Other specified cough: Secondary | ICD-10-CM

## 2016-05-10 DIAGNOSIS — R05 Cough: Secondary | ICD-10-CM

## 2016-05-10 MED ORDER — PREDNISONE 20 MG PO TABS: 20.0000 mg | ORAL_TABLET | Freq: Two times a day (BID) | ORAL | 0 refills | Status: DC

## 2016-05-10 MED ORDER — BENZONATATE 200 MG PO CAPS: 200.0000 mg | ORAL_CAPSULE | Freq: Three times a day (TID) | ORAL | 0 refills | Status: DC | PRN

## 2016-05-10 MED ORDER — AZITHROMYCIN 250 MG PO TABS: ORAL_TABLET | ORAL | 0 refills | Status: DC

## 2016-05-10 MED ORDER — IPRATROPIUM BROMIDE 0.03 % NA SOLN: 2.0000 | Freq: Four times a day (QID) | NASAL | 0 refills | Status: DC | PRN

## 2016-05-10 NOTE — Progress Notes (Signed)
HPI: Julie Prince is a 56 y.o. female who presents to [facility] 05/10/16 for chief complaint of:  Chief Complaint  Patient presents with  . Cough    Acute Illness: . Context: husband sick recently - hospitalized with the flu  . Location/quality: chest discomfort and cough - Feels like congestion is settling in the chest . Assoc signs/symptoms: see ROS . Duration: 10 days . Modifying factors: has tried the following OTC/Rx medications: none   Past medical, social and family history reviewed.  Patient Active Problem List   Diagnosis Date Noted  . Barrett's esophagus 03/11/2016  . 1st degree AV block 03/01/2016  . Tachycardia 03/01/2016  . Dysphagia 02/10/2016  . Bloating 02/10/2016  . Constipation 02/10/2016  . Vulvar abscess   . Abscess 06/21/2015  . Sepsis ([place]) 06/21/2015  . GERD (gastroesophageal reflux disease) 07/31/2014  . Adenomatous polyp of colon 04/03/2014  . Allergic rhinitis 01/23/2014  . Type 2 diabetes mellitus ([place]) 09/12/2013  . Hyperlipidemia 09/12/2013  . Essential hypertension, benign 09/12/2013  . Diabetic peripheral neuropathy associated with type 2 diabetes mellitus ([place]) 09/12/2013    Current medications and allergies reviewed.     Review of Systems:  Constitutional: Yes  Fever/chills initially which have improved, no fever recently  HEENT: Yes  headache, No  sore throat, No  swollen glands  Cardiovascular: No chest pain  Respiratory:Yes  cough, Yes  shortness of breath  Gastrointestinal: Yes  nausea, No  vomiting,  some diarrhea  Musculoskeletal:   Yes  myalgia/arthralgia  Skin/Integument:  No  rash   Detailed Exam:  BP (!) 155/86   Pulse 94   Temp 98.4 F (36.9 C) (Oral)   Wt 241 lb (109.3 kg)   BMI 40.73 kg/m   Constitutional:   VSS, see above.   General Appearance: alert, well-developed, well-nourished, NAD  Eyes:   Normal lids and conjunctive, non-icteric sclera  Ears, Nose,  Mouth, Throat:   Normal external inspection ears/nares  Normal mouth/lips/gums, MMM  normal TM  posterior pharynx without erythema, without exudate  nasal mucosa normal  Skin:  Normal inspection, no rash or concerning lesions noted on limited exam  Neck:   No masses, trachea midline. normal lymph nodes  Respiratory:   Normal respiratory effort.   No  wheeze/rhonchi/rales  Cardiovascular:   S1/S2 normal, no murmur/rub/gallop auscultated. RRR.    ASSESSMENT/PLAN: Abx to fill if needed but suspect postviral cough/fatigue and will recover with supportive care, fill abx if no better, lungs sound clear and pt declines CXR at this time. She'll like she may have had the flu from her husband, advised patient in the future if any flu exposure, call the office and we can prescribe prophylactic Tamiflu  Post-viral cough syndrome - Plan: azithromycin (ZITHROMAX) 250 MG tablet, benzonatate (TESSALON) 200 MG capsule, ipratropium (ATROVENT) 0.03 % nasal spray, predniSONE (DELTASONE) 20 MG tablet   Visit summary was printed for the patient with medications and pertinent instructions for patient to review. ER/RTC precautions reviewed. All questions answered. Return if symptoms worsen or fail to improve.

## 2016-06-21 ENCOUNTER — Encounter: Payer: BLUE CROSS/BLUE SHIELD | Admitting: Osteopathic Medicine

## 2016-07-04 ENCOUNTER — Ambulatory Visit: Payer: BLUE CROSS/BLUE SHIELD | Admitting: Osteopathic Medicine

## 2016-07-24 ENCOUNTER — Other Ambulatory Visit: Payer: Self-pay | Admitting: Osteopathic Medicine

## 2016-08-03 ENCOUNTER — Ambulatory Visit: Payer: BLUE CROSS/BLUE SHIELD | Admitting: Osteopathic Medicine

## 2016-08-30 ENCOUNTER — Other Ambulatory Visit: Payer: Self-pay | Admitting: Osteopathic Medicine

## 2016-09-09 ENCOUNTER — Ambulatory Visit: Payer: BLUE CROSS/BLUE SHIELD | Admitting: Family Medicine

## 2017-03-07 ENCOUNTER — Ambulatory Visit: Payer: BLUE CROSS/BLUE SHIELD | Admitting: Internal Medicine

## 2017-03-07 ENCOUNTER — Encounter: Payer: Self-pay | Admitting: Internal Medicine

## 2017-03-07 VITALS — BP 144/74 | HR 82 | Ht 64.0 in | Wt 231.0 lb

## 2017-03-07 DIAGNOSIS — R1013 Epigastric pain: Secondary | ICD-10-CM | POA: Diagnosis not present

## 2017-03-07 DIAGNOSIS — K22719 Barrett's esophagus with dysplasia, unspecified: Secondary | ICD-10-CM | POA: Diagnosis not present

## 2017-03-07 DIAGNOSIS — K219 Gastro-esophageal reflux disease without esophagitis: Secondary | ICD-10-CM

## 2017-03-07 DIAGNOSIS — Z86010 Personal history of colonic polyps: Secondary | ICD-10-CM

## 2017-03-07 MED ORDER — PANTOPRAZOLE SODIUM 40 MG PO TBEC: 40.0000 mg | DELAYED_RELEASE_TABLET | Freq: Two times a day (BID) | ORAL | 1 refills | Status: DC

## 2017-03-07 MED ORDER — SUPREP BOWEL PREP KIT 17.5-3.13-1.6 GM/177ML PO SOLN: 1.0000 | ORAL | 0 refills | Status: DC

## 2017-03-07 NOTE — Progress Notes (Signed)
Subjective:    Patient ID: Julie Prince, female    DOB: 1960-11-12, 56 y.o.   MRN: [id number]  HPI Julie Prince is a 56 year old female with a history of advanced adenoma of the rectum in 2016, GERD with Barrett's esophagus who is here for follow-up.  She is here today with her daughter.  She was last seen about 13 months ago by Alonza Bogus, PA-C.  After her last visit she came for upper endoscopy on 03/02/2016.  This showed grade B reflux esophagitis and short segment Barrett's esophagus.  Balloon dilation was performed 18 mm across the GE junction.  A 3 cm hiatal hernia was present in the examined stomach and duodenum were normal.  She reports that she has had return of her acid reflux worse after meals along with subxiphoid epigastric abdominal pain.  She also feels abdominal bloating.  This had all resolved with pantoprazole but insurance necessitated a change to omeprazole.  She is been using omeprazole 40 mg daily with poor reflux control.  She reports feeling abdominal bloating and also borborygmi.  Bowel movements occur 1-2 days/week but this is not a change for her.  No blood in her stool or melena.  No dysphagia or odynophagia.  Occasional nausea with vomiting of clear emesis also had improved with pantoprazole.  Some mild odynophagia with hot liquids only.  Otherwise no painful swallowing.  Review of Systems As per HPI, otherwise negative  Current Medications, Allergies, Past Medical History, Past Surgical History, Family History and Social History were reviewed in [employer] [employer] record.     Objective:   Physical Exam BP (!) 144/74   Pulse 82   Ht 5\' 4"  (1.626 m)   Wt 231 lb (104.8 kg)   BMI 39.65 kg/m  Constitutional: Well-developed and well-nourished. No distress. HEENT: Normocephalic and atraumatic.   No scleral icterus. Neck: Neck supple. Trachea midline. Cardiovascular: Normal rate, regular rhythm and intact distal pulses. No M/R/G Pulmonary/chest:  Effort normal and breath sounds normal. No wheezing, rales or rhonchi. Abdominal: Soft, nontender, nondistended. Bowel sounds active throughout. There are no masses palpable. No hepatosplenomegaly. Extremities: no clubbing, cyanosis, or edema Neurological: Alert and oriented to person place and time. Skin: Skin is warm and dry. Psychiatric: Normal mood and affect. Behavior is normal.      Assessment & Plan:  56 year old female with a history of advanced adenoma of the rectum in 2016, GERD with Barrett's esophagus who is here for follow-up.    1.  GERD/Barrett's esophagus/epigastric pain --reflux is uncontrolled despite omeprazole.  She has failed this medication given her return of symptoms.  Previous good control with pantoprazole.  Change back to pantoprazole 40 mg twice daily before meals times 1 month and once daily thereafter.  If no improvement she is asked to notify me and we would consider change in therapy versus repeating the upper endoscopy.  She is due Barrett's surveillance in December 2020.  No further dysphagia after dilation last year.  2.  History of adenoma with high-grade dysplasia --due for surveillance colonoscopy in January 2019.  We will arrange this study today.  We discussed the risk, benefits and alternatives and she is agreeable and wishes to proceed.  3.  Possible constipation --we discussed her abdominal bloating may be due to mild constipation and with infrequent bowel movements.  I chose to try to control issue #1 prior to consideration of addition of a laxative.  She has not had any change in bowel movements.  25  minutes spent with the patient today. Greater than 50% was spent in counseling and coordination of care with the patient

## 2017-03-07 NOTE — Patient Instructions (Signed)
You have been scheduled for a colonoscopy. Please follow written instructions given to you at your visit today.  Please pick up your prep supplies at the pharmacy within the next 1-3 days. If you use inhalers (even only as needed), please bring them with you on the day of your procedure. Your physician has requested that you go to www.startemmi.com and enter the access code given to you at your visit today. This web site gives a general overview about your procedure. However, you should still follow specific instructions given to you by our office regarding your preparation for the procedure.  We have sent the following medications to your pharmacy for you to pick up at your convenience: Protonix 40 mg twice daily before meals x 1 month, then decrease to once daily thereafter  Discontinue omeprazole.  If you are age 47 or older, your body mass index should be between 23-30. Your Body mass index is 39.65 kg/m. If this is out of the aforementioned range listed, please consider follow up with your Primary Care Provider.  If you are age 66 or younger, your body mass index should be between 19-25. Your Body mass index is 39.65 kg/m. If this is out of the aformentioned range listed, please consider follow up with your Primary Care Provider.

## 2017-03-22 ENCOUNTER — Other Ambulatory Visit: Payer: Self-pay

## 2017-03-22 DIAGNOSIS — E119 Type 2 diabetes mellitus without complications: Secondary | ICD-10-CM

## 2017-03-22 MED ORDER — GLUCOSE BLOOD VI STRP: ORAL_STRIP | 0 refills | Status: DC

## 2017-03-22 MED ORDER — ONETOUCH DELICA LANCETS 33G MISC: 0 refills | Status: DC

## 2017-04-06 ENCOUNTER — Encounter: Payer: Self-pay | Admitting: Internal Medicine

## 2017-04-12 ENCOUNTER — Encounter: Payer: Self-pay | Admitting: Internal Medicine

## 2017-04-12 ENCOUNTER — Ambulatory Visit (AMBULATORY_SURGERY_CENTER): Payer: BLUE CROSS/BLUE SHIELD | Admitting: Internal Medicine

## 2017-04-12 ENCOUNTER — Other Ambulatory Visit: Payer: Self-pay

## 2017-04-12 VITALS — BP 147/61 | HR 78 | Temp 96.9°F | Resp 13 | Ht 64.0 in | Wt 231.0 lb

## 2017-04-12 DIAGNOSIS — Z86010 Personal history of colonic polyps: Secondary | ICD-10-CM | POA: Diagnosis present

## 2017-04-12 MED ORDER — SODIUM CHLORIDE 0.9 % IV SOLN: 500.0000 mL | Freq: Once | INTRAVENOUS | Status: DC

## 2017-04-12 NOTE — Op Note (Signed)
[facility] Patient Name: [name] Gonyea Procedure Date: 04/12/2017 2:09 PM MRN: [id number] Endoscopist: Jerene Bears , MD Age: 57 Referring MD:  Date of Birth: 1960-07-26 Gender: Female Account #: [id number] Procedure:                Colonoscopy Indications:              High risk colon cancer surveillance: Personal                            history of adenoma with high grade dysplasia, Last                            colonoscopy: Jan 2016; follow-up flexible                            sigmoidoscopy July 2016 Medicines:                Monitored Anesthesia Care Procedure:                Pre-Anesthesia Assessment:                           - Prior to the procedure, a History and Physical                            was performed, and patient medications and                            allergies were reviewed. The patient's tolerance of                            previous anesthesia was also reviewed. The risks                            and benefits of the procedure and the sedation                            options and risks were discussed with the patient.                            All questions were answered, and informed consent                            was obtained. Prior Anticoagulants: The patient has                            taken no previous anticoagulant or antiplatelet                            agents. ASA Grade Assessment: II - A patient with                            mild systemic disease. After reviewing the risks  and benefits, the patient was deemed in                            satisfactory condition to undergo the procedure.                           After obtaining informed consent, the colonoscope                            was passed under direct vision. Throughout the                            procedure, the patient's blood pressure, pulse, and                            oxygen saturations were monitored continuously.  The                            Colonoscope was introduced through the anus and                            advanced to the the cecum, identified by                            appendiceal orifice and ileocecal valve. The                            colonoscopy was performed without difficulty. The                            patient tolerated the procedure well. The quality                            of the bowel preparation was good. The ileocecal                            valve, appendiceal orifice, and rectum were                            photographed. Scope In: 2:15:54 PM Scope Out: 2:32:34 PM Scope Withdrawal Time: 0 hours 12 minutes 22 seconds  Total Procedure Duration: 0 hours 16 minutes 40 seconds  Findings:                 The digital rectal exam was normal.                           A post polypectomy scar was found in the rectum.                            There was no evidence of the previous polyp.                           A few small-mouthed diverticula were found in the  proximal transverse colon and hepatic flexure.                           Anal papilla(e) were hypertrophied on rectal                            retroflexed views. Complications:            No immediate complications. Estimated Blood Loss:     Estimated blood loss: none. Impression:               - Post-polypectomy scar in the rectum. No residual                            polyp seen.                           - Diverticulosis in the proximal transverse colon                            and at the hepatic flexure.                           - No specimens collected. Recommendation:           - Patient has a contact number available for                            emergencies. The signs and symptoms of potential                            delayed complications were discussed with the                            patient. Return to normal activities tomorrow.                             Written discharge instructions were provided to the                            patient.                           - Resume previous diet.                           - Continue present medications.                           - Repeat colonoscopy in 5 years for surveillance. Jerene Bears, MD 04/12/2017 2:38:13 PM This report has been signed electronically.

## 2017-04-12 NOTE — Patient Instructions (Signed)
YOU HAD AN ENDOSCOPIC PROCEDURE TODAY AT [facility]:   Refer to the procedure report that was given to you for any specific questions about what was found during the examination.  If the procedure report does not answer your questions, please call your gastroenterologist to clarify.  If you requested that your care partner not be given the details of your procedure findings, then the procedure report has been included in a sealed envelope for you to review at your convenience later.  YOU SHOULD EXPECT: Some feelings of bloating in the abdomen. Passage of more gas than usual.  Walking can help get rid of the air that was put into your GI tract during the procedure and reduce the bloating. If you had a lower endoscopy (such as a colonoscopy or flexible sigmoidoscopy) you may notice spotting of blood in your stool or on the toilet paper. If you underwent a bowel prep for your procedure, you may not have a normal bowel movement for a few days.  Please Note:  You might notice some irritation and congestion in your nose or some drainage.  This is from the oxygen used during your procedure.  There is no need for concern and it should clear up in a day or so.  SYMPTOMS TO REPORT IMMEDIATELY:   Following lower endoscopy (colonoscopy or flexible sigmoidoscopy):  Excessive amounts of blood in the stool  Significant tenderness or worsening of abdominal pains  Swelling of the abdomen that is new, acute  Fever of 100F or higher  For urgent or emergent issues, a gastroenterologist can be reached at any hour by calling [phone].   DIET:  We do recommend a small meal at first, but then you may proceed to your regular diet.  Drink plenty of fluids but you should avoid alcoholic beverages for 24 hours.  ACTIVITY:  You should plan to take it easy for the rest of today and you should NOT DRIVE or use heavy machinery until tomorrow (because of the sedation medicines used during the test).     FOLLOW UP: Our staff will call the number listed on your records the next business day following your procedure to check on you and address any questions or concerns that you may have regarding the information given to you following your procedure. If we do not reach you, we will leave a message.  However, if you are feeling well and you are not experiencing any problems, there is no need to return our call.  We will assume that you have returned to your regular daily activities without incident.  If any biopsies were taken you will be contacted by phone or by letter within the next 1-3 weeks.  Please call us at [phone] if you have not heard about the biopsies in 3 weeks.    Repeat next Colonoscopy screening in 5 years Diverticulosis (handout given)  SIGNATURES/CONFIDENTIALITY: You and/or your care partner have signed paperwork which will be entered into your electronic medical record.  These signatures attest to the fact that that the information above on your After Visit Summary has been reviewed and is understood.  Full responsibility of the confidentiality of this discharge information lies with you and/or your care-partner.

## 2017-04-12 NOTE — Progress Notes (Signed)
Pt's states no medical or surgical changes since previsit or office visit. 

## 2017-04-13 ENCOUNTER — Telehealth: Payer: Self-pay

## 2017-04-13 ENCOUNTER — Telehealth: Payer: Self-pay | Admitting: *Deleted

## 2017-04-13 NOTE — Telephone Encounter (Signed)
Left message on answering machine. 

## 2017-04-13 NOTE — Telephone Encounter (Signed)
  Follow up Call-  Call back number 04/12/2017 03/02/2016 10/09/2014  Post procedure Call Back phone  # [phone] hm [phone]  Permission to leave phone message Yes Yes Yes  Some recent data might be hidden     Message left to call us if necessary.

## 2017-05-24 ENCOUNTER — Encounter (HOSPITAL_COMMUNITY): Payer: Self-pay

## 2017-05-24 ENCOUNTER — Emergency Department (HOSPITAL_COMMUNITY)
Admission: EM | Admit: 2017-05-24 | Discharge: 2017-05-24 | Disposition: A | Discharge status: Home / Self Care | Payer: BLUE CROSS/BLUE SHIELD | Attending: Emergency Medicine | Admitting: Emergency Medicine

## 2017-05-24 DIAGNOSIS — Y999 Unspecified external cause status: Secondary | ICD-10-CM | POA: Diagnosis not present

## 2017-05-24 DIAGNOSIS — W278XXA Contact with other nonpowered hand tool, initial encounter: Secondary | ICD-10-CM | POA: Diagnosis not present

## 2017-05-24 DIAGNOSIS — Z87891 Personal history of nicotine dependence: Secondary | ICD-10-CM | POA: Insufficient documentation

## 2017-05-24 DIAGNOSIS — Z7984 Long term (current) use of oral hypoglycemic drugs: Secondary | ICD-10-CM | POA: Diagnosis not present

## 2017-05-24 DIAGNOSIS — Y9389 Activity, other specified: Secondary | ICD-10-CM | POA: Diagnosis not present

## 2017-05-24 DIAGNOSIS — E1140 Type 2 diabetes mellitus with diabetic neuropathy, unspecified: Secondary | ICD-10-CM | POA: Insufficient documentation

## 2017-05-24 DIAGNOSIS — I10 Essential (primary) hypertension: Secondary | ICD-10-CM | POA: Insufficient documentation

## 2017-05-24 DIAGNOSIS — Y9229 Other specified public building as the place of occurrence of the external cause: Secondary | ICD-10-CM | POA: Diagnosis not present

## 2017-05-24 DIAGNOSIS — S00412A Abrasion of left ear, initial encounter: Secondary | ICD-10-CM | POA: Diagnosis not present

## 2017-05-24 DIAGNOSIS — Z79899 Other long term (current) drug therapy: Secondary | ICD-10-CM | POA: Insufficient documentation

## 2017-05-24 DIAGNOSIS — H9202 Otalgia, left ear: Secondary | ICD-10-CM | POA: Diagnosis present

## 2017-05-24 NOTE — Discharge Instructions (Signed)
Wash the affected area with soap and water and apply a thin layer of topical antibiotic ointment. Do this every 12 hours.   Do not use rubbing alcohol or hydrogen peroxide.                        Look for signs of infection: if you see redness, if the area becomes warm, if pain increases sharply, there is discharge (pus), if red streaks appear or you develop fever or vomiting, RETURN immediately to the Emergency Department  for a recheck.

## 2017-05-24 NOTE — ED Triage Notes (Signed)
Pt reports today while at pcp the md attempted to clean out ears d/t wax build up. Pt states since then left ear canal is swollen, bleeding, and painful. No ear complaints prior to having ear cleaned.

## 2017-05-24 NOTE — ED Provider Notes (Signed)
[facility] EMERGENCY DEPARTMENT Provider Note   CSN: [id number] Arrival date & time: 05/24/17  1316     History   Chief Complaint No chief complaint on file.  HPI   Blood pressure (!) 180/77, pulse (!) 103, temperature 98.3 F (36.8 C), temperature source Oral, resp. rate 16, SpO2 100 %.  Julie Prince is a 57 y.o. female complaining of blood from the left ear canal and pain after she visited her primary care doctor this morning and there was a cerumen irrigation and removal with scoop.  Tetanus shot was 6 years ago, she states that her hearing is only very slightly reduced.  Pain is mild, no medication taken prior to arrival.  Past Medical History:  Diagnosis Date  . Allergy   . Cataract    left cataract removal  . Essential hypertension, benign 09/12/2013  . GERD (gastroesophageal reflux disease)   . Hyperlipidemia 09/12/2013  . Type 2 diabetes mellitus ([place]) 09/12/2013   Dr. Posey Pronto at [facility]     Patient Active Problem List   Diagnosis Date Noted  . Barrett's esophagus 03/11/2016  . 1st degree AV block 03/01/2016  . Tachycardia 03/01/2016  . Dysphagia 02/10/2016  . Bloating 02/10/2016  . Constipation 02/10/2016  . Vulvar abscess   . Abscess 06/21/2015  . Sepsis ([place]) 06/21/2015  . GERD (gastroesophageal reflux disease) 07/31/2014  . Adenomatous polyp of colon 04/03/2014  . Allergic rhinitis 01/23/2014  . Type 2 diabetes mellitus ([place]) 09/12/2013  . Hyperlipidemia 09/12/2013  . Essential hypertension, benign 09/12/2013  . Diabetic peripheral neuropathy associated with type 2 diabetes mellitus ([place]) 09/12/2013    Past Surgical History:  Procedure Laterality Date  . APPENDECTOMY    . CATARACT EXTRACTION Left   . CESAREAN SECTION  1990  . COLONOSCOPY    . right tube and ovary removed    . WISDOM TOOTH EXTRACTION      OB History    Gravida Para Term Preterm AB Living   4 3 2 1 1 3    SAB TAB Ectopic Multiple Live Births   1 0  0 0 3       Home Medications    Prior to Admission medications   Medication Sig Start Date End Date Taking? Authorizing Provider  AMBULATORY NON FORMULARY MEDICATION Single glucometer with lancets #100 Refill x99, test strips #100 Refill x99. Please fill the above in accordance with whatever supplies are covered by insurance. 03/01/16   Emeterio Reeve, DO  amLODipine (NORVASC) 10 MG tablet Take 1 tablet (10 mg total) by mouth daily. 09/28/15   Hommel, Sean, DO  glimepiride (AMARYL) 4 MG tablet Take 1 tablet (4 mg total) by mouth 2 (two) times daily. 03/01/16   Emeterio Reeve, DO  glucose blood (ONE TOUCH ULTRA TEST) test strip Use to check BS once daily,  dx E11.9 03/22/17   Emeterio Reeve, DO  Insulin Glargine-Lixisenatide (SOLIQUA) 100-33 UNT-MCG/ML SOPN Inject 40 Units into the skin daily.    [provider]  Jonetta Speak LANCETS 56E MISC Use to check BS once daily, dx E11.9 03/22/17   Emeterio Reeve, DO  pantoprazole (PROTONIX) 40 MG tablet Take 1 tablet (40 mg total) by mouth 2 (two) times daily. 03/07/17   Pyrtle, Lajuan Lines, MD  pravastatin (PRAVACHOL) 20 MG tablet Take 1 tablet (20 mg total) by mouth daily. Patient taking differently: Take 80 mg by mouth every evening.  05/11/15   Marcial Pacas, DO  Vitamin D, Ergocalciferol, (DRISDOL) 50000 units  CAPS capsule TAKE 1 CAPSULE (50,000 UNITS TOTAL) BY MOUTH EVERY 7 (SEVEN) DAYS. TAKE FOR 8 TOTAL DOSES(WEEKS) 07/25/16   Emeterio Reeve, DO    Family History Family History  Problem Relation Age of Onset  . Lung cancer Mother   . Heart disease Maternal Aunt   . Colon cancer Neg Hx   . Esophageal cancer Neg Hx   . Rectal cancer Neg Hx   . Stomach cancer Neg Hx   . Pancreatic cancer Neg Hx     Social History Social History   Tobacco Use  . Smoking status: Former Research scientist (life sciences)  . Smokeless tobacco: Never Used  . Tobacco comment: quit 30 plus years ago.  Substance Use Topics  . Alcohol use: No    Alcohol/week: 0.0 oz   . Drug use: No     Allergies   Penicillins; Metformin and related; and Atorvastatin   Review of Systems Review of Systems  A complete review of systems was obtained and all systems are negative except as noted in the HPI and PMH.    Physical Exam Updated Vital Signs BP (!) 180/77 (BP Location: Left Arm)   Pulse (!) 103   Temp 98.3 F (36.8 C) (Oral)   Resp 16   SpO2 100%   Physical Exam  Constitutional: She is oriented to person, place, and time. She appears well-developed and well-nourished. No distress.  HENT:  Head: Normocephalic and atraumatic.  Mouth/Throat: Oropharynx is clear and moist.  Left outer ear canal with fresh blood, visualized TM on the left without perforation.  Normal architecture and good light reflex.  Right outer ear canal without abnormality, right TM with normal architecture good light reflex.  Eyes: Conjunctivae and EOM are normal. Pupils are equal, round, and reactive to light.  Neck: Normal range of motion.  Cardiovascular: Normal rate, regular rhythm and intact distal pulses.  Pulmonary/Chest: Effort normal and breath sounds normal.  Abdominal: Soft. There is no tenderness.  Musculoskeletal: Normal range of motion.  Neurological: She is alert and oriented to person, place, and time.  Skin: She is not diaphoretic.  Psychiatric: She has a normal mood and affect.  Nursing note and vitals reviewed.    ED Treatments / Results  Labs (all labs ordered are listed, but only abnormal results are displayed) Labs Reviewed - No data to display  EKG  EKG Interpretation None       Radiology No results found.  Procedures Procedures (including critical care time)  Medications Ordered in ED Medications - No data to display   Initial Impression / Assessment and Plan / ED Course  I have reviewed the triage vital signs and the nursing notes.  Pertinent labs & imaging results that were available during my care of the patient were reviewed by  me and considered in my medical decision making (see chart for details).    Vitals:   05/24/17 1357  BP: (!) 180/77  Pulse: (!) 103  Resp: 16  Temp: 98.3 F (36.8 C)  TempSrc: Oral  SpO2: 100%    Medications - No data to display  Julie Prince is 57 y.o. female presenting with pain and bleeding to left outer ear canal status post cerumen removal.  TM appears intact, outer ear canal appears with a partial-thickness abrasion.  Tetanus shot is up-to-date, counseled her to lightly apply bacitracin or Neosporin to a Q-tip and gently insert, as needed referral to ENT if she has any continued issues with her hearing.  Evaluation does not show  pathology that would require ongoing emergent intervention or inpatient treatment. Pt is hemodynamically stable and mentating appropriately. Discussed findings and plan with patient/guardian, who agrees with care plan. All questions answered. Return precautions discussed and outpatient follow up given.      Final Clinical Impressions(s) / ED Diagnoses   Final diagnoses:  Abrasion of left ear canal, initial encounter    ED Discharge Orders    None       Eliezer Khawaja, Charna Elizabeth 05/24/17 1713    Daleen Bo, MD 05/24/17 2229

## 2017-05-30 ENCOUNTER — Other Ambulatory Visit: Payer: Self-pay | Admitting: Family Medicine

## 2017-05-30 DIAGNOSIS — Z1239 Encounter for other screening for malignant neoplasm of breast: Secondary | ICD-10-CM

## 2017-06-08 ENCOUNTER — Ambulatory Visit (INDEPENDENT_AMBULATORY_CARE_PROVIDER_SITE_OTHER): Payer: BLUE CROSS/BLUE SHIELD

## 2017-06-08 DIAGNOSIS — Z1231 Encounter for screening mammogram for malignant neoplasm of breast: Secondary | ICD-10-CM

## 2017-06-08 DIAGNOSIS — Z1239 Encounter for other screening for malignant neoplasm of breast: Secondary | ICD-10-CM

## 2017-06-09 ENCOUNTER — Telehealth: Payer: Self-pay | Admitting: *Deleted

## 2017-06-09 ENCOUNTER — Encounter: Payer: Self-pay | Admitting: *Deleted

## 2017-06-09 ENCOUNTER — Other Ambulatory Visit: Payer: Self-pay | Admitting: Internal Medicine

## 2017-06-09 NOTE — Telephone Encounter (Signed)
Patient's Pantoprazole has been approved. PA Case ID: [id number]

## 2017-06-30 ENCOUNTER — Encounter (HOSPITAL_COMMUNITY): Payer: Self-pay | Admitting: Emergency Medicine

## 2017-06-30 ENCOUNTER — Emergency Department (HOSPITAL_COMMUNITY): Payer: BLUE CROSS/BLUE SHIELD

## 2017-06-30 ENCOUNTER — Emergency Department (HOSPITAL_COMMUNITY)
Admission: EM | Admit: 2017-06-30 | Discharge: 2017-06-30 | Disposition: A | Discharge status: Home / Self Care | Payer: BLUE CROSS/BLUE SHIELD | Attending: Emergency Medicine | Admitting: Emergency Medicine

## 2017-06-30 ENCOUNTER — Other Ambulatory Visit: Payer: Self-pay

## 2017-06-30 DIAGNOSIS — E119 Type 2 diabetes mellitus without complications: Secondary | ICD-10-CM | POA: Diagnosis not present

## 2017-06-30 DIAGNOSIS — Z7902 Long term (current) use of antithrombotics/antiplatelets: Secondary | ICD-10-CM | POA: Insufficient documentation

## 2017-06-30 DIAGNOSIS — Z87891 Personal history of nicotine dependence: Secondary | ICD-10-CM | POA: Insufficient documentation

## 2017-06-30 DIAGNOSIS — I10 Essential (primary) hypertension: Secondary | ICD-10-CM | POA: Diagnosis present

## 2017-06-30 DIAGNOSIS — J029 Acute pharyngitis, unspecified: Secondary | ICD-10-CM | POA: Diagnosis not present

## 2017-06-30 DIAGNOSIS — Z79899 Other long term (current) drug therapy: Secondary | ICD-10-CM | POA: Diagnosis not present

## 2017-06-30 DIAGNOSIS — N3000 Acute cystitis without hematuria: Secondary | ICD-10-CM

## 2017-06-30 DIAGNOSIS — Z7984 Long term (current) use of oral hypoglycemic drugs: Secondary | ICD-10-CM | POA: Diagnosis not present

## 2017-06-30 LAB — URINALYSIS, ROUTINE W REFLEX MICROSCOPIC
BILIRUBIN URINE: NEGATIVE
Glucose, UA: >=500 mg/dL — AB
Ketones, ur: NEGATIVE mg/dL
Nitrite: NEGATIVE
PH: 5 (ref 5.0–8.0)
Protein, ur: 30 mg/dL — AB
Specific Gravity, Urine: 1.012 (ref 1.005–1.030)

## 2017-06-30 LAB — CBC
HCT: 39.6 % (ref 36.0–46.0)
Hemoglobin: 13.3 g/dL (ref 12.0–15.0)
MCH: 31.6 pg (ref 26.0–34.0)
MCHC: 33.6 g/dL (ref 30.0–36.0)
MCV: 94.1 fL (ref 78.0–100.0)
Platelets: 262 10*3/uL (ref 150–400)
RBC: 4.21 MIL/uL (ref 3.87–5.11)
RDW: 12.5 % (ref 11.5–15.5)
WBC: 6.9 10*3/uL (ref 4.0–10.5)

## 2017-06-30 LAB — I-STAT TROPONIN, ED
TROPONIN I, POC: 0.01 ng/mL (ref 0.00–0.08)
TROPONIN I, POC: 0.01 ng/mL (ref 0.00–0.08)

## 2017-06-30 LAB — BASIC METABOLIC PANEL
ANION GAP: 12 (ref 5–15)
BUN: 6 mg/dL (ref 6–20)
CALCIUM: 9.3 mg/dL (ref 8.9–10.3)
CO2: 24 mmol/L (ref 22–32)
CREATININE: 0.81 mg/dL (ref 0.44–1.00)
Chloride: 103 mmol/L (ref 101–111)
GFR calc Af Amer: >60 mL/min (ref >60)
GLUCOSE: 253 mg/dL — AB (ref 65–99)
Potassium: 4.3 mmol/L (ref 3.5–5.1)
Sodium: 139 mmol/L (ref 135–145)

## 2017-06-30 LAB — GROUP A STREP BY PCR: GROUP A STREP BY PCR: NOT DETECTED

## 2017-06-30 MED ORDER — SULFAMETHOXAZOLE-TRIMETHOPRIM 800-160 MG PO TABS: 1.0000 | ORAL_TABLET | Freq: Two times a day (BID) | ORAL | 0 refills | Status: AC

## 2017-06-30 MED ORDER — AMLODIPINE BESYLATE 10 MG PO TABS: 10.0000 mg | ORAL_TABLET | Freq: Every day | ORAL | 1 refills | Status: DC

## 2017-06-30 NOTE — ED Provider Notes (Signed)
[facility] EMERGENCY DEPARTMENT Provider Note   CSN: [id number] Arrival date & time: 06/30/17  1140     History   Chief Complaint Chief Complaint  Patient presents with  . Hypertension    HPI Julie Prince is a 57 y.o. female with past medical history significant for diabetes, hyperlipidemia, hypertension, presenting from urgent care with elevated blood pressure and abnormal EKG with tachycardia in the 130s.  She reports that she has been noncompliant with her antihypertensives for approximately 1 month as she ran out of medications.  He has a PCP appointment on 24th of April.  She denies any chest pain, shortness of breath, dizziness, nausea, vomiting, diaphoresis.  She explains that she went to urgent care because she had been coughing up mucus.  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  She denies any fever, chills, sob.  She also reported on arrival that she had been experiencing abnormal odor to her urine but denies any dysuria or or hematuria.  HPI  Past Medical History:  Diagnosis Date  . Allergy   . Barrett esophagus   . Cataract    left cataract removal  . Essential hypertension, benign 09/12/2013  . GERD (gastroesophageal reflux disease)   . Hyperlipidemia 09/12/2013  . Type 2 diabetes mellitus ([place]) 09/12/2013   Dr. Posey Pronto at [facility]     Patient Active Problem List   Diagnosis Date Noted  . Barrett's esophagus 03/11/2016  . 1st degree AV block 03/01/2016  . Tachycardia 03/01/2016  . Dysphagia 02/10/2016  . Bloating 02/10/2016  . Constipation 02/10/2016  . Vulvar abscess   . Abscess 06/21/2015  . Sepsis ([place]) 06/21/2015  . GERD (gastroesophageal reflux disease) 07/31/2014  . Adenomatous polyp of colon 04/03/2014  . Allergic rhinitis 01/23/2014  . Type 2 diabetes mellitus ([place]) 09/12/2013  . Hyperlipidemia 09/12/2013  . Essential hypertension, benign 09/12/2013  . Diabetic peripheral neuropathy  associated with type 2 diabetes mellitus ([place]) 09/12/2013    Past Surgical History:  Procedure Laterality Date  . APPENDECTOMY    . CATARACT EXTRACTION Left   . CESAREAN SECTION  1990  . COLONOSCOPY    . right tube and ovary removed    . WISDOM TOOTH EXTRACTION       OB History    Gravida  4   Para  3   Term  2   Preterm  1   AB  1   Living  3     SAB  1   TAB  0   Ectopic  0   Multiple  0   Live Births  3            Home Medications    Prior to Admission medications   Medication Sig Start Date End Date Taking? Authorizing Provider  AMBULATORY NON FORMULARY MEDICATION Single glucometer with lancets #100 Refill x99, test strips #100 Refill x99. Please fill the above in accordance with whatever supplies are covered by insurance. 03/01/16   Emeterio Reeve, DO  amLODipine (NORVASC) 10 MG tablet Take 1 tablet (10 mg total) by mouth daily. 06/30/17   Avie Echevaria B, PA-C  glimepiride (AMARYL) 4 MG tablet Take 1 tablet (4 mg total) by mouth 2 (two) times daily. 03/01/16   Emeterio Reeve, DO  glucose blood (ONE TOUCH ULTRA TEST) test strip Use to check BS once daily,  dx E11.9 03/22/17   Emeterio Reeve, DO  Insulin Glargine-Lixisenatide (SOLIQUA) 100-33 UNT-MCG/ML SOPN Inject 40  Units into the skin daily.    [provider]  Jonetta Speak LANCETS 16X MISC Use to check BS once daily, dx E11.9 03/22/17   Emeterio Reeve, DO  pantoprazole (PROTONIX) 40 MG tablet Take 1 tablet (40 mg total) by mouth daily. 06/09/17   Pyrtle, Lajuan Lines, MD  pravastatin (PRAVACHOL) 20 MG tablet Take 1 tablet (20 mg total) by mouth daily. Patient taking differently: Take 80 mg by mouth every evening.  05/11/15   Hommel, Sean, DO  sulfamethoxazole-trimethoprim (BACTRIM DS,SEPTRA DS) 800-160 MG tablet Take 1 tablet by mouth 2 (two) times daily for 5 days. 06/30/17 07/05/17  Avie Echevaria B, PA-C  Vitamin D, Ergocalciferol, (DRISDOL) 50000 units CAPS capsule TAKE 1 CAPSULE  (50,000 UNITS TOTAL) BY MOUTH EVERY 7 (SEVEN) DAYS. TAKE FOR 8 TOTAL DOSES(WEEKS) 07/25/16   Emeterio Reeve, DO    Family History Family History  Problem Relation Age of Onset  . Lung cancer Mother   . Heart disease Maternal Aunt   . Colon cancer Neg Hx   . Esophageal cancer Neg Hx   . Rectal cancer Neg Hx   . Stomach cancer Neg Hx   . Pancreatic cancer Neg Hx     Social History Social History   Tobacco Use  . Smoking status: Former Research scientist (life sciences)  . Smokeless tobacco: Never Used  . Tobacco comment: quit 30 plus years ago.  Substance Use Topics  . Alcohol use: No    Alcohol/week: 0.0 oz  . Drug use: No     Allergies   Penicillins; Metformin and related; and Atorvastatin   Review of Systems Review of Systems  Constitutional: Negative for chills, diaphoresis, fatigue and fever.  HENT: Positive for congestion, sore throat and voice change. Negative for drooling, ear pain, facial swelling, sinus pressure, tinnitus and trouble swallowing.   Eyes: Negative for pain, redness and visual disturbance.  Respiratory: Positive for cough. Negative for choking, chest tightness, shortness of breath, wheezing and stridor.   Cardiovascular: Positive for palpitations. Negative for chest pain and leg swelling.       Patient reports that she has experienced palpitations in the past, but not at this time.  Gastrointestinal: Negative for abdominal distention, abdominal pain, blood in stool, nausea and vomiting.  Genitourinary: Negative for difficulty urinating, dysuria, flank pain and hematuria.       Odor to the urine, no dysuria or hematuria  Musculoskeletal: Negative for arthralgias, gait problem, myalgias, neck pain and neck stiffness.  Skin: Negative for color change, pallor and rash.  Neurological: Negative for dizziness, weakness, light-headedness and headaches.  Psychiatric/Behavioral: Negative for behavioral problems.     Physical Exam Updated Vital Signs BP (!) 179/80   Pulse 94    Temp 99.5 F (37.5 C) (Oral)   Resp 19   SpO2 93%   Physical Exam  Constitutional: She is oriented to person, place, and time. She appears well-developed and well-nourished. No distress.  Afebrile, nontoxic-appearing, sitting comfortably in bed no acute distress.  HENT:  Head: Normocephalic and atraumatic.  Mouth/Throat: No oropharyngeal exudate.  Oropharynx is erythematous with tonsillar exudate. No gross oral abscess. Uvula is midline, arches are simmetrical and intact. No peritonsilar swelling. No trismus. Sublingual mucosa is soft and non-tender. Tolerating oral secretions. No concern for ludwig's angina.  Eyes: Conjunctivae are normal. Right eye exhibits no discharge. Left eye exhibits no discharge.  Neck: Neck supple.  Cardiovascular: Normal rate, regular rhythm and normal heart sounds.  Pulmonary/Chest: Effort normal and breath sounds normal. No stridor.  No respiratory distress. She has no wheezes. She has no rales. She exhibits no tenderness.  Abdominal: She exhibits no distension.  Musculoskeletal: Normal range of motion. She exhibits no edema, tenderness or deformity.  Neurological: She is alert and oriented to person, place, and time.  Skin: Skin is warm and dry. No rash noted. She is not diaphoretic. No erythema. No pallor.  Psychiatric: She has a normal mood and affect.  Nursing note and vitals reviewed.    ED Treatments / Results  Labs (all labs ordered are listed, but only abnormal results are displayed) Labs Reviewed  BASIC METABOLIC PANEL - Abnormal; Notable for the following components:      Result Value   Glucose, Bld 253 (*)    All other components within normal limits  URINALYSIS, ROUTINE W REFLEX MICROSCOPIC - Abnormal; Notable for the following components:   APPearance HAZY (*)    Glucose, UA >=500 (*)    Hgb urine dipstick SMALL (*)    Protein, ur 30 (*)    Leukocytes, UA SMALL (*)    Bacteria, UA MANY (*)    Squamous Epithelial / LPF 0-5 (*)    All  other components within normal limits  GROUP A STREP BY PCR  URINE CULTURE  CBC  I-STAT TROPONIN, ED  I-STAT TROPONIN, ED    EKG EKG Interpretation  Date/Time:  Friday June 30 2017 12:08:30 EDT Ventricular Rate:  93 PR Interval:  176 QRS Duration: 64 QT Interval:  344 QTC Calculation: 427 R Axis:   -38 Text Interpretation:  Normal sinus rhythm Left axis deviation Low voltage QRS Inferior infarct , age undetermined Anterolateral infarct , age undetermined Abnormal ECG Confirmed by Davonna Belling [phone]) on 06/30/2017 5:33:59 PM   Radiology Dg Chest 2 View  Result Date: 06/30/2017 CLINICAL DATA: Cough.  Elevated blood pressure. EXAM: CHEST - 2 VIEW COMPARISON:  06/26/2015. FINDINGS: Mediastinum hilar structures normal. Lungs are clear. No pleural effusion or pneumothorax. Mild cardiomegaly. No pulmonary venous congestion. No acute bony abnormality. IMPRESSION: 1.  Mild cardiomegaly.  No pulmonary venous congestion. 2.  No acute pulmonary disease. Electronically Signed   By: Marcello Moores  Register   On: 06/30/2017 12:47    Procedures Procedures (including critical care time)  Medications Ordered in ED Medications - No data to display   Initial Impression / Assessment and Plan / ED Course  I have reviewed the triage vital signs and the nursing notes.  Pertinent labs & imaging results that were available during my care of the patient were reviewed by me and considered in my medical decision making (see chart for details).    Patient presenting from urgent care with tachycardia in the 130s, elevated blood pressure.  Patient was being seen at urgent care for congestion cough productive sputum and sore throat. Negative for flu  She also reported strong odor to her urine and UA with evidence of UTI here. Patient reports being noncompliant with her antihypertensive medications for approximately 1 month.  She denies any symptoms, no headache, nausea, vomiting, shortness of breath, chest  pain, dizziness or any other symptoms.  Obtained rapid strep due to sore throat, oropharynx is erythematous with bilateral exudate and patient works in a daycare.  Strep negative Pt presents afebrile without tonsillar exudate, negative strep. mild cervical lymphadenopathy, & odynophagia; diagnosis of viral pharyngitis. No abx indicated. DC w symptomatic tx for pain. Pt does not appear dehydrated, but did discuss importance of water rehydration. Presentation non concerning for PTA or infxn spread  to soft tissue. No trismus or uvula deviation.   Delta troponin negative Heart score: 3  Will discharge home with symptomatic relief and close follow up with PCP and cardiology for evaluation of palpitations and Holter. Patient continues to deny any symptoms while in the ER. She is well-appearing and afebrile.  Discussed strict return precautions and advised to return to the emergency department if experiencing any new or worsening symptoms. Instructions were understood and patient agreed with discharge plan.  Final Clinical Impressions(s) / ED Diagnoses   Final diagnoses:  Hypertension, unspecified type  Acute cystitis without hematuria  Pharyngitis, unspecified etiology    ED Discharge Orders        Ordered    sulfamethoxazole-trimethoprim (BACTRIM DS,SEPTRA DS) 800-160 MG tablet  2 times daily     06/30/17 1927    amLODipine (NORVASC) 10 MG tablet  Daily     06/30/17 1927       Dossie Der 06/30/17 1931    Davonna Belling, MD 06/30/17 2216

## 2017-06-30 NOTE — ED Notes (Signed)
Pt stated that she wanted to know if we can test her Urine. Pt stated that it has an odor.

## 2017-06-30 NOTE — Discharge Instructions (Addendum)
As discussed, make sure that you stay well-hydrated drinking enough fluids to keep your urine clear.  Take your entire course of antibiotics even if you feel better.  Follow-up with your primary care provider as soon as possible to discuss blood pressure medications refill.  Drink tea with honey, cough drops, over-the-counter throat sprays to help soothe your throat.  Follow-up with cardiology for further evaluation of palpitations. Return if symptoms worsen, chest pain, shortness of breath, nausea, vomiting, dizziness,visual disturbances, severe headache or any other new concerning symptoms in the meantime.

## 2017-06-30 NOTE — ED Triage Notes (Signed)
Patient sent her by Urgent Care in [facility] for EKG  Changes and hypertension. Notes from Urgent Care cite heart rate in 130's, heart rate during triage mid 90's. Denies chest pain and shortness of breath both now and during EKG at urgent care.

## 2017-07-03 LAB — URINE CULTURE: Culture: >=100000 — AB

## 2017-07-04 ENCOUNTER — Telehealth: Payer: Self-pay | Admitting: *Deleted

## 2017-07-04 NOTE — Telephone Encounter (Signed)
Post ED Visit - Positive Culture Follow-up  Culture report reviewed by antimicrobial stewardship pharmacist:  []  Elenor Quinones, Pharm.D. []  Heide Guile, Pharm.D., BCPS AQ-ID []  Parks Neptune, Pharm.D., BCPS []  Alycia Rossetti, Pharm.D., BCPS []  [place], [place].D., BCPS, AAHIVP [x]  Legrand Como, Pharm.D., BCPS, AAHIVP []  Salome Arnt, PharmD, BCPS []  Jalene Mullet, PharmD []  Vincenza Hews, PharmD, BCPS  Positive urine culture Treated with Sulfamethoxazole-Trimethoprim, organism sensitive to the same and no further patient follow-up is required at this time.  Harlon Flor [place] 07/04/2017, 10:00 AM

## 2017-07-07 LAB — HM DIABETES EYE EXAM

## 2017-07-12 ENCOUNTER — Ambulatory Visit (INDEPENDENT_AMBULATORY_CARE_PROVIDER_SITE_OTHER): Payer: BLUE CROSS/BLUE SHIELD | Admitting: Family Medicine

## 2017-07-12 ENCOUNTER — Encounter: Payer: Self-pay | Admitting: Family Medicine

## 2017-07-12 VITALS — BP 188/107 | HR 103 | Ht 64.0 in | Wt 227.4 lb

## 2017-07-12 DIAGNOSIS — E782 Mixed hyperlipidemia: Secondary | ICD-10-CM

## 2017-07-12 DIAGNOSIS — Z794 Long term (current) use of insulin: Secondary | ICD-10-CM

## 2017-07-12 DIAGNOSIS — E1159 Type 2 diabetes mellitus with other circulatory complications: Secondary | ICD-10-CM

## 2017-07-12 DIAGNOSIS — N183 Chronic kidney disease, stage 3 unspecified: Secondary | ICD-10-CM

## 2017-07-12 DIAGNOSIS — E0822 Diabetes mellitus due to underlying condition with diabetic chronic kidney disease: Secondary | ICD-10-CM | POA: Insufficient documentation

## 2017-07-12 DIAGNOSIS — E118 Type 2 diabetes mellitus with unspecified complications: Secondary | ICD-10-CM

## 2017-07-12 DIAGNOSIS — Z9114 Patient's other noncompliance with medication regimen: Secondary | ICD-10-CM | POA: Diagnosis not present

## 2017-07-12 DIAGNOSIS — I10 Essential (primary) hypertension: Secondary | ICD-10-CM

## 2017-07-12 DIAGNOSIS — I152 Hypertension secondary to endocrine disorders: Secondary | ICD-10-CM

## 2017-07-12 DIAGNOSIS — E1169 Type 2 diabetes mellitus with other specified complication: Secondary | ICD-10-CM | POA: Insufficient documentation

## 2017-07-12 LAB — POCT GLYCOSYLATED HEMOGLOBIN (HGB A1C): Hemoglobin A1C: 9.5

## 2017-07-12 MED ORDER — CARVEDILOL 6.25 MG PO TABS: 6.2500 mg | ORAL_TABLET | Freq: Two times a day (BID) | ORAL | 1 refills | Status: DC

## 2017-07-12 NOTE — Progress Notes (Signed)
New patient office visit note:  Impression and Recommendations:    1. Type 2 diabetes mellitus with complication, with long-term current use of insulin ([name])   2. Hypertension associated with diabetes ([name])   3. 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 Diabetes mellitus due to underlying condition with stage 3 chronic kidney disease, without long-term current use of insulin ([name])   5. Obesity, Class III, morbid obesity  ([name])   6. H/O medication noncompliance     - According to last labs on 04/26/2016, patient with Stage III CRF.  - Emphasized critical importance of taking all medications as currently prescribed.  1. Poorly Controlled Diabetes Mellitus (Follow-Up Scheduled with Endocrinology in September) - Advised patient that she should be following up regularly with endocrinology to manage her diabetes.  - Referral placed today to diabetes educator.  - Counseled patient on pathophysiology of disease and discussed various treatment options, which often includes dietary and lifestyle modifications as first line.  Importance of low carb/ketogenic diet discussed with patient in addition to regular exercise.   - Check FBS, especially after you eat poorly as well as particularly healthy.  She may also check sugars 2 hours postprandial.  Keep log and bring in next OV for my review.   Also, if you ever feel poorly, please check your blood pressure and blood sugar, as one or the other could be the cause of your symptoms.  - Being a diabetic, you need yearly eye and foot exams. Make appt.for diabetic eye exam.   - Continue current medication(s).   2. Nutritional Referral - Nutritional referral placed today.  - Patient formerly saw a nutritionist by the name of Joycelyn Schmid in [facility] and would like to see her again if still available.  Otherwise, she is willing to visit a nutritionist in [place].  3. Hypertension  -poorly controlled  and not at goal bp which was d/c pt today  - pt asx / stable and I presume has been poorly controlled for a long time b/c of this  - ADDED NEW MED TODAY:  Told pt to start by Half-tablet carvedilol prescribed today in addition to amlodipine.  - Lifestyle changes such as DASH diet and engaging in a regular exercise program discussed with patient.  Education provided.  - Encouraged patient to begin ambulatory BP monitoring.  Keep log and bring in next OV. - Sit quietly for 15-20 minutes prior to reading, with no prior stimulation socially or with stimulating foods. - Patient should also check blood pressure when feeling symptomatic.  - Discussed goal blood pressure as 130/80 or under.  - Continue current medication(s).  Contact us prior to next OV with any Q's/ concerns.  4. Health Maintenance BMI Explained to patient what BMI refers to, and what it means medically.    Told patient to think about it as a "medical risk stratification measurement" and how increasing BMI is associated with increasing risk/ or worsening state of various diseases such as hypertension, hyperlipidemia, diabetes, premature OA, depression etc.  American Heart Association guidelines for healthy diet, basically Mediterranean diet, and exercise guidelines of 30 minutes 5 days per week or more discussed in detail.  Health counseling performed.  All questions answered.  Exercise & Diet - Advised patient to continue working toward exercising to improve health.  - Advised that weight loss helps with everything from mood, depression, anxiety, diabetes, blood pressure, etc.    - Patient  should begin with 15 minutes of activity daily. Recommended that the patient eventually strive for at least 150 minutes of moderate cardiovascular activity per week according to guidelines established by the [facility].   - Healthy dietary habits encouraged, including low-carb, and high amounts of lean protein in diet.   - Patient should also  consume adequate amounts of water - half of body weight in oz of water per day.   Education and routine counseling performed. Handouts provided.  5. Follow-Up - Return at earliest convenience for fasting blood work, then meet for follow-up OV to discuss labs.  Pt was in the office today for 60+ minutes, with over 50% time spent in face to face counseling of patients various medical conditions, treatment plans of those medical conditions including medicine management and lifestyle modification, strategies to improve health and well being; and in coordination of care. SEE ABOVE FOR DETAILS   Orders Placed This Encounter  Procedures  . Ambulatory referral to diabetic education  . POCT glycosylated hemoglobin (Hb A1C)  . HM Diabetes Foot Exam    Meds ordered this encounter  Medications  . carvedilol (COREG) 6.25 MG tablet    Sig: Take 1 tablet (6.25 mg total) by mouth 2 (two) times daily with a meal.    Dispense:  60 tablet    Refill:  1    Gross side effects, risk and benefits, and alternatives of medications discussed with patient.  Patient is aware that all medications have potential side effects and we are unable to predict every side effect or drug-drug interaction that may occur.  Expresses verbal understanding and consents to current therapy plan and treatment regimen.  Return for Chronic OV w me near future & FBW 2-3d prior.  Please see AVS handed out to patient at the end of our visit for further patient instructions/ counseling done pertaining to today's office visit.    Note: This document was prepared using Dragon voice recognition software and may include unintentional dictation errors.  This document serves as a record of services personally performed by Mellody Dance, DO. It was created on her behalf by Toni Amend, a trained medical scribe. The creation of this record is based on the scribe's personal observations and the provider's statements to them.   I  have reviewed the above medical documentation for accuracy and completeness and I concur.  Mellody Dance 07/21/17 8:50 AM    ----------------------------------------------------------------------------------------------------------------------    Subjective:    Chief complaint:   Chief Complaint  Patient presents with  . Establish Care    HPI: Julie Prince is a pleasant 57 y.o. female who presents to [place] [facility] today to review their medical history with me and establish care.   I asked the patient to review their chronic problem list with me to ensure everything was updated and accurate.    All recent office visits with other providers, any medical records that patient brought in etc  - I reviewed today.     We asked pt to get [name] their medical records from [facility] providers/ specialists that they had seen within the past 3-5 years- if they are in private practice and/or do not work for [employer], [facility] [place], [place] or [employer] owned practice.  Told them to call their specialists to clarify this if they are not sure.   Patient is travelling from [place], [place] to her appointment today.  It's been over a year since her last visit with her prior  PCP in [place].    She has an upcoming appointment with endocrinology in September.    -Patient with long history of noncompliance and has not followed up with her previous providers as instructed.  Patient is not certain-2 exact medicines she is on when we went through her med list today.  Notes that she kept talking about her insulin with past providers and was displeased with the help she received.  Social History Housewife.  Married to Mr. Catharine Kettlewell. He has bad rheumatoid arthritis.  However, he works full time. He is a Administrator; has been working full-time for 34 years. Husband does not smoke.  Not currently sexually active.  Has three children.  Osvaldo Human is her baby and she  referred her to the clinic here. Has four grandchildren and one great granddaughter.  Oldest sibling of several.  - Tobacco Use Quit smoking over 30 years ago.  - EtOH Use Does not drink.  Currently unsatisfied with her weight.  Used to exercise for an hour a day, 3 days weekly. Does not currently exercise.   Family History Biological dad has HTN, and that's all she knows. Patient was adopted by her stepfather and has only known her bio dad for 10 years.  Mom had lung cancer, and was a heavy smoker. Mom died at the age of 35. Notes that this was due to pneumonia "setting in the good lung."   Past Medical History  Patient notes that she's been actively trying to lose weight. She has been eating less, trying to eat six meals daily, and has lost 8 lbs.  Notes that 2 years ago, she weighed around 240-250 lbs, and had even lost all the way down to 182 lbs.   Notes that she felt good about her weight at that time, but her diabetes was still bad.  - Diabetes Has been diagnosed with diabetes, poorly controlled, for 15 years. Her diabetes has never been well-controlled.  In the past, she took levemir.  Notes that it never worked. She was on glimepiride in the past, but went off due to lack of prescription. Did well on glimepiride but ran out of prescription. Patient notes that she can't take metformin due to GI distress - it "puts her on the toilet all day long." Patient is currently taking Soliqua, but notes that it gives her indigestion.  Takes 45 units in the AM.  Takes nothing at night.  Sugar at home this morning was 502.  A1c today is 9.5  Lab Results  Component Value Date   HGBA1C 9.5 07/12/2017   HGBA1C 10.0 03/01/2016   HGBA1C 8.6 09/28/2015   Endocrinology Patient was scheduled to follow up on 09/28/2015 with Dr. Buddy Duty.   She cancelled that appointment due to her husband getting sick.  She has never been to endocrinology as of this date.  -  Hypertension Has had HTN ever since she's been overweight, about 3 years (since 2016).  Was taking losartan, but was taken off of it.  Currently on amlodipine.  Patient has not been checking her blood pressure at home.  pt is ASx   - Cholesterol Was on Pravachol before, but currently takes Lipitor.  Patient takes atorvastatin at night.  No history of heart attack or stroke.    Wt Readings from Last 3 Encounters:  07/12/17 227 lb 6.4 oz (103.1 kg)  04/12/17 231 lb (104.8 kg)  03/07/17 231 lb (104.8 kg)   BP Readings from Last 3 Encounters:  07/12/17 Marland Kitchen)  188/107  06/30/17 (!) 179/80  05/24/17 (!) 149/72   Pulse Readings from Last 3 Encounters:  07/12/17 (!) 103  06/30/17 90  05/24/17 90   BMI Readings from Last 3 Encounters:  07/12/17 39.03 kg/m  04/12/17 39.65 kg/m  03/07/17 39.65 kg/m    Patient Care Team    Relationship Specialty Notifications Start End  Mellody Dance, DO PCP - General Family Medicine  07/12/17   Pyrtle, Lajuan Lines, MD Consulting Physician Gastroenterology  07/13/17   Delrae Rend, MD Consulting Physician Endocrinology  07/13/17   Emily Filbert, MD Consulting Physician Obstetrics and Gynecology  07/13/17     Patient Active Problem List   Diagnosis Date Noted  . Diabetes mellitus due to underlying condition with stage 3 chronic kidney disease, without long-term current use of insulin ([place]) 07/12/2017  . Mixed diabetic hyperlipidemia associated with type 2 diabetes mellitus ([place]) 07/12/2017  . Hypertension associated with diabetes ([place]) 07/12/2017  . Obesity, Class III, morbid obesity  ([place]) 07/12/2017  . H/O medication noncompliance 07/12/2017  . Barrett's esophagus 03/11/2016  . 1st degree AV block 03/01/2016  . Tachycardia 03/01/2016  . Dysphagia 02/10/2016  . Bloating 02/10/2016  . Constipation 02/10/2016  . Vulvar abscess   . Abscess 06/21/2015  . Sepsis ([place]) 06/21/2015  . GERD (gastroesophageal reflux disease) 07/31/2014  .  Adenomatous polyp of colon 04/03/2014  . Allergic rhinitis 01/23/2014  . CTS (carpal tunnel syndrome) 10/03/2013  . DM (diabetes mellitus), type 2, uncontrolled ([place]) 10/03/2013  . Low back pain 10/03/2013  . Lumbar arthropathy 10/03/2013  . Lumbar degenerative disc disease 10/03/2013  . Neuropathy 10/03/2013  . Osteoarthritis 10/03/2013  . 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 Tenosynovitis of foot 10/03/2013  . Type 2 diabetes mellitus ([place]) 09/12/2013  . Hyperlipidemia 09/12/2013  . Essential hypertension, benign 09/12/2013  . Diabetic peripheral neuropathy associated with type 2 diabetes mellitus ([place]) 09/12/2013     Past Medical History:  Diagnosis Date  . Allergy   . Barrett esophagus   . Cataract    left cataract removal  . Essential hypertension, benign 09/12/2013  . GERD (gastroesophageal reflux disease)   . Hyperlipidemia 09/12/2013  . Type 2 diabetes mellitus ([place]) 09/12/2013   Dr. Posey Pronto at [place]      Past Medical History:  Diagnosis Date  . Allergy   . Barrett esophagus   . Cataract    left cataract removal  . Essential hypertension, benign 09/12/2013  . GERD (gastroesophageal reflux disease)   . Hyperlipidemia 09/12/2013  . Type 2 diabetes mellitus ([place]) 09/12/2013   Dr. Posey Pronto at [facility]      Past Surgical History:  Procedure Laterality Date  . APPENDECTOMY    . CATARACT EXTRACTION Left   . CESAREAN SECTION  1990  . COLONOSCOPY    . right tube and ovary removed    . WISDOM TOOTH EXTRACTION       Family History  Problem Relation Age of Onset  . Lung cancer Mother   . Heart disease Maternal Aunt   . Colon cancer Neg Hx   . Esophageal cancer Neg Hx   . Rectal cancer Neg Hx   . Stomach cancer Neg Hx   . Pancreatic cancer Neg Hx      Social History   Substance and Sexual Activity  Drug Use No     Social History   Substance and Sexual Activity  Alcohol Use No  . Alcohol/week: 0.0 oz     Social History  Tobacco Use  Smoking Status Former Smoker  . Packs/day: 0.50  Smokeless Tobacco Never Used  Tobacco Comment   quit 30 plus years ago.     Current Meds  Medication Sig  . amLODipine (NORVASC) 10 MG tablet Take 1 tablet (10 mg total) by mouth daily.  . Insulin Glargine-Lixisenatide (SOLIQUA) 100-33 UNT-MCG/ML SOPN Inject 40 Units into the skin daily.  . pantoprazole (PROTONIX) 40 MG tablet Take 40 mg by mouth 2 (two) times daily.  . Vitamin D, Ergocalciferol, (DRISDOL) 50000 units CAPS capsule TAKE 1 CAPSULE (50,000 UNITS TOTAL) BY MOUTH EVERY 7 (SEVEN) DAYS. TAKE FOR 8 TOTAL DOSES(WEEKS) (Patient taking differently: Take 50,000 Units by mouth 2 (two) times a week. Take for 8 total doses(weeks))   Current Facility-Administered Medications for the 07/12/17 encounter (Office Visit) with Mellody Dance, DO  Medication  . 0.9 %  sodium chloride infusion    Allergies: Penicillins and Metformin and related   Review of Systems  Constitutional: Negative for chills, diaphoresis, fever, malaise/fatigue and weight loss.  HENT: Negative for congestion, sore throat and tinnitus.   Eyes: Negative for blurred vision, double vision and photophobia.  Respiratory: Negative for cough and wheezing.   Cardiovascular: Negative for chest pain and palpitations.  Gastrointestinal: Negative for blood in stool, diarrhea, nausea and vomiting.  Genitourinary: Negative for dysuria, frequency and urgency.  Musculoskeletal: Negative for joint pain and myalgias.  Skin: Negative for itching and rash.  Neurological: Negative for dizziness, focal weakness, weakness and headaches.  Endo/Heme/Allergies: Negative for environmental allergies and polydipsia. Does not bruise/bleed easily.  Psychiatric/Behavioral: Negative for depression and memory loss. The patient is not nervous/anxious and does not have insomnia.      Objective:   Blood pressure (!) 188/107, pulse (!) 103, height '5\' 4"'$  (1.626 m), weight 227 lb  6.4 oz (103.1 kg), SpO2 99 %. Body mass index is 39.03 kg/m. General: Well Developed, well nourished, and in no acute distress.  Neuro: Alert and oriented x3, extra-ocular muscles intact, sensation grossly intact.  HEENT:[place]/AT, PERRLA, neck supple, No carotid bruits Skin: no gross rashes  Cardiac: Regular rate and rhythm Respiratory: Essentially clear to auscultation bilaterally. Not using accessory muscles, speaking in full sentences.  Abdominal: not grossly distended Musculoskeletal: Ambulates w/o diff, FROM * 4 ext.  Vasc: less 2 sec cap RF, warm and pink  Psych:  No HI/SI, judgement and insight good, Euthymic mood. Full Affect.    Recent Results (from the past 2160 hour(s))  Basic metabolic panel     Status: Abnormal   Collection Time: 06/30/17 12:09 PM  Result Value Ref Range   Sodium 139 135 - 145 mmol/L   Potassium 4.3 3.5 - 5.1 mmol/L   Chloride 103 101 - 111 mmol/L   CO2 24 22 - 32 mmol/L   Glucose, Bld 253 (H) 65 - 99 mg/dL   BUN 6 6 - 20 mg/dL   Creatinine, Ser 0.81 0.44 - 1.00 mg/dL   Calcium 9.3 8.9 - 10.3 mg/dL   GFR calc non Af Amer >60 >60 mL/min   GFR calc Af Amer >60 >60 mL/min    Comment: (NOTE) The eGFR has been calculated using the CKD EPI equation. This calculation has not been validated in all clinical situations. eGFR's persistently <60 mL/min signify possible Chronic Kidney Disease.    Anion gap 12 5 - 15    Comment: Performed at [facility] [address] [place], [place] [address]  CBC     Status: None  Collection Time: 06/30/17 12:09 PM  Result Value Ref Range   WBC 6.9 4.0 - 10.5 K/uL   RBC 4.21 3.87 - 5.11 MIL/uL   Hemoglobin 13.3 12.0 - 15.0 g/dL   HCT 39.6 36.0 - 46.0 %   MCV 94.1 78.0 - 100.0 fL   MCH 31.6 26.0 - 34.0 pg   MCHC 33.6 30.0 - 36.0 g/dL   RDW 12.5 11.5 - 15.5 %   Platelets 262 150 - 400 K/uL    Comment: Performed at [facility], [place] [address] [place], [place] [address]  I-stat troponin, ED     Status:  None   Collection Time: 06/30/17 12:22 PM  Result Value Ref Range   Troponin i, poc 0.01 0.00 - 0.08 ng/mL   Comment 3            Comment: Due to the release kinetics of cTnI, a negative result within the first hours of the onset of symptoms does not rule out myocardial infarction with certainty. If myocardial infarction is still suspected, repeat the test at appropriate intervals.   Urinalysis, Routine w reflex microscopic     Status: Abnormal   Collection Time: 06/30/17  4:23 PM  Result Value Ref Range   Color, Urine YELLOW YELLOW   APPearance HAZY (A) CLEAR   Specific Gravity, Urine 1.012 1.005 - 1.030   pH 5.0 5.0 - 8.0   Glucose, UA >=500 (A) NEGATIVE mg/dL   Hgb urine dipstick SMALL (A) NEGATIVE   Bilirubin Urine NEGATIVE NEGATIVE   Ketones, ur NEGATIVE NEGATIVE mg/dL   Protein, ur 30 (A) NEGATIVE mg/dL   Nitrite NEGATIVE NEGATIVE   Leukocytes, UA SMALL (A) NEGATIVE   RBC / HPF 0-5 0 - 5 RBC/hpf   WBC, UA TOO NUMEROUS TO COUNT 0 - 5 WBC/hpf   Bacteria, UA MANY (A) NONE SEEN   Squamous Epithelial / LPF 0-5 (A) NONE SEEN   Mucus PRESENT     Comment: Performed at [facility], [address], [place], [place] [address]  Urine culture     Status: Abnormal   Collection Time: 06/30/17  4:23 PM  Result Value Ref Range   Specimen Description URINE, RANDOM    Special Requests      NONE Performed at [facility], [place] [address], [place], [place] [address]    Culture >=100,000 COLONIES/mL ESCHERICHIA COLI (A)    Report Status 07/03/2017 FINAL    Organism ID, Bacteria ESCHERICHIA COLI (A)       Susceptibility   Escherichia coli - MIC*    AMPICILLIN <=2 SENSITIVE Sensitive     CEFAZOLIN <=4 SENSITIVE Sensitive     CEFTRIAXONE <=1 SENSITIVE Sensitive     CIPROFLOXACIN <=0.25 SENSITIVE Sensitive     GENTAMICIN <=1 SENSITIVE Sensitive     IMIPENEM <=0.25 SENSITIVE Sensitive     NITROFURANTOIN <=16 SENSITIVE Sensitive     TRIMETH/SULFA <=20 SENSITIVE Sensitive      AMPICILLIN/SULBACTAM <=2 SENSITIVE Sensitive     PIP/TAZO <=4 SENSITIVE Sensitive     Extended ESBL NEGATIVE Sensitive     * >=100,000 COLONIES/mL ESCHERICHIA COLI  I-Stat Troponin, ED (not at [facility])     Status: None   Collection Time: 06/30/17  6:11 PM  Result Value Ref Range   Troponin i, poc 0.01 0.00 - 0.08 ng/mL   Comment 3            Comment: Due to the release kinetics of cTnI, a negative  result within the first hours of the onset of symptoms does not rule out myocardial infarction with certainty. If myocardial infarction is still suspected, repeat the test at appropriate intervals.   Group A Strep by PCR     Status: None   Collection Time: 06/30/17  6:15 PM  Result Value Ref Range   Group A Strep by PCR NOT DETECTED NOT DETECTED    Comment: Performed at [facility], [address], [place], [place] [address]  POCT glycosylated hemoglobin (Hb A1C)     Status: Abnormal   Collection Time: 07/12/17  4:22 PM  Result Value Ref Range   Hemoglobin A1C 9.5

## 2017-07-12 NOTE — Patient Instructions (Addendum)
Because your blood pressure and pulse are still so elevated today we will add a blood pressure medicine called carvedilol.  You will start off taking 1/2 tablet twice daily.  Do not take it as prescribed on the bottle to start.   If you have any problems with the medicine please let us know otherwise we will see you in a couple of weeks during your follow-up office visit to discuss labs as well as see how you are doing on this new blood pressure medicine.  - Please look at your medication list that we have on the last couple of pages and ensure that this is exactly what you are taking at home and how you are taking it.  If you notice any discrepancies between list and what you are actually doing, please call our office and have 1 of our medical assistants corrected in your chart.  -Again management of these diseases is not just about taking medicines, it is taking personal responsibility for your health and well-being and eating and exercising etc.  It is about caring for yourself more and being healthier.  - Please keep track of your fasting blood sugars and write them down and bring in next office visit.  Also if you ever feel poorly-shaky or lightheaded etc. please check your blood pressure and blood sugar.  Write it down.  You can check your 2 hours postprandial which is after your largest meal and write that blood sugar down as well.  -Otherwise please check your blood pressure at least once or twice daily after sitting quietly for 15 to 20 minutes.  Please write down the blood pressure and pulse.     Diabetes Mellitus and Standards of Medical Care  Managing diabetes (diabetes mellitus) can be complicated. Your diabetes treatment may be managed by a team of health care providers, including:  A diet and nutrition specialist (registered dietitian).  A nurse.  A certified diabetes educator (CDE).  A diabetes specialist (endocrinologist).  An eye doctor.  A primary care provider.  A  dentist.  Your health care providers follow a schedule in order to help you get the best quality of care. The following schedule is a general guideline for your diabetes management plan. Your health care providers may also give you more specific instructions.  HbA1c (hemoglobin A1c) test This test provides information about blood sugar (glucose) control over the previous 2-3 months. It is used to check whether your diabetes management plan needs to be adjusted.  If you are meeting your treatment goals, this test is done at least 2 times a year.  If you are not meeting treatment goals or if your treatment goals have changed, this test is done 4 times a year.  Blood pressure test  This test is done at every routine medical visit. For most people, the goal is less than 130/80. Ask your health care provider what your goal blood pressure should be.  Dental and eye exams  Visit your dentist two times a year.  If you have type 1 diabetes, get an eye exam 3-5 years after you are diagnosed, and then once a year after your first exam. ? If you were diagnosed with type 1 diabetes as a child, get an eye exam when you are age 63 or older and have had diabetes for 3-5 years. After the first exam, you should get an eye exam once a year.  If you have type 2 diabetes, have an eye exam as soon as you  are diagnosed, and then once a year after your first exam.  Foot care exam  Visual foot exams are done at every routine medical visit. The exams check for cuts, bruises, redness, blisters, sores, or other problems with the feet.  A complete foot exam is done by your health care provider once a year. This exam includes an inspection of the structure and skin of your feet, and a check of the pulses and sensation in your feet. ? Type 1 diabetes: Get your first exam 3-5 years after diagnosis. ? Type 2 diabetes: Get your first exam as soon as you are diagnosed.  Check your feet every day for cuts, bruises,  redness, blisters, or sores. If you have any of these or other problems that are not healing, contact your health care provider.  Kidney function test (urine microalbumin)  This test is done once a year. ? Type 1 diabetes: Get your first test 5 years after diagnosis. ? Type 2 diabetes: Get your first test as soon as you are diagnosed._  If you have chronic kidney disease (CKD), get a serum creatinine and estimated glomerular filtration rate (eGFR) test once a year.  Lipid profile (cholesterol, HDL, LDL, triglycerides)  This test should be done when you are diagnosed with diabetes, and every 5 years after the first test. If you are on medicines to lower your cholesterol, you may need to get this test done every year. ? The goal for LDL is less than 100 mg/dL (5.5 mmol/L). If you are at high risk, the goal is less than 70 mg/dL (3.9 mmol/L). ? The goal for HDL is 40 mg/dL (2.2 mmol/L) for men and 50 mg/dL(2.8 mmol/L) for women. An HDL cholesterol of 60 mg/dL (3.3 mmol/L) or higher gives some protection against heart disease. ? The goal for triglycerides is less than 150 mg/dL (8.3 mmol/L).  Immunizations  The yearly flu (influenza) vaccine is recommended for everyone 6 months or older who has diabetes.  The pneumonia (pneumococcal) vaccine is recommended for everyone 2 years or older who has diabetes. If you are 74 or older, you may get the pneumonia vaccine as a series of two separate shots.  The hepatitis B vaccine is recommended for adults shortly after they have been diagnosed with diabetes.  The Tdap (tetanus, diphtheria, and pertussis) vaccine should be given: ? According to normal childhood vaccination schedules, for children. ? Every 10 years, for adults who have diabetes.  The shingles vaccine is recommended for people who have had chicken pox and are 50 years or older.  Mental and emotional health  Screening for symptoms of eating disorders, anxiety, and depression is  recommended at the time of diagnosis and afterward as needed. If your screening shows that you have symptoms (you have a positive screening result), you may need further evaluation and be referred to a mental health care provider.  Diabetes self-management education  Education about how to manage your diabetes is recommended at diagnosis and ongoing as needed.  Treatment plan  Your treatment plan will be reviewed at every medical visit.  Summary  Managing diabetes (diabetes mellitus) can be complicated. Your diabetes treatment may be managed by a team of health care providers.  Your health care providers follow a schedule in order to help you get the best quality of care.  Standards of care including having regular physical exams, blood tests, blood pressure monitoring, immunizations, screening tests, and education about how to manage your diabetes.  Your health care providers may  also give you more specific instructions based on your individual health.      Type 2 Diabetes Mellitus, Self Care, Adult Caring for yourself after you have been diagnosed with type 2 diabetes (type 2 diabetes mellitus) means keeping your blood sugar (glucose) under control with a balance of:  Nutrition.  Exercise.  Lifestyle changes.  Medicines or insulin, if necessary.  Support from your team of health care providers and others.  The following information explains what you need to know to manage your diabetes at home. What do I need to do to manage my blood glucose?  Check your blood glucose every day, as often as told by your health care provider.  Contact your health care provider if your blood glucose is above your target for 2 tests in a row.  Have your A1c (hemoglobin A1c) level checked at least two times a year, or as often as told by your health care provider. Your health care provider will set individualized treatment goals for you. Generally, the goal of treatment is to maintain the  following blood glucose levels:  Before meals (preprandial): 80-130 mg/dL (4.4-7.2 mmol/L).  After meals (postprandial): below 180 mg/dL (10 mmol/L).  A1c level: less than 7%.  What do I need to know about hyperglycemia and hypoglycemia? What is hyperglycemia? Hyperglycemia, also called high blood glucose, occurs when blood glucose is too high.Make sure you know the early signs of hyperglycemia, such as:  Increased thirst.  Hunger.  Feeling very tired.  Needing to urinate more often than usual.  Blurry vision.  What is hypoglycemia? Hypoglycemia, also called low blood glucose, occurswith a blood glucose level at or below 70 mg/dL (3.9 mmol/L). The risk for hypoglycemia increases during or after exercise, during sleep, during illness, and when skipping meals or not eating for a long time (fasting). It is important to know the symptoms of hypoglycemia and treat it right away. Always have a 15-gram rapid-acting carbohydrate snack with you to treat low blood glucose. Family members and close friends should also know the symptoms and should understand how to treat hypoglycemia, in case you are not able to treat yourself. What are the symptoms of hypoglycemia? Hypoglycemia symptoms can include:  Hunger.  Anxiety.  Sweating and feeling clammy.  Confusion.  Dizziness or feeling light-headed.  Sleepiness.  Nausea.  Increased heart rate.  Headache.  Blurry vision.  Seizure.  Nightmares.  Tingling or numbness around the mouth, lips, or tongue.  A change in speech.  Decreased ability to concentrate.  A change in coordination.  Restless sleep.  Tremors or shakes.  Fainting.  Irritability.  How do I treat hypoglycemia?  If you are alert and able to swallow safely, follow the 15:15 rule:  Take 15 grams of a rapid-acting carbohydrate. Rapid-acting options include: ? 1 tube of glucose gel. ? 3 glucose pills. ? 6-8 pieces of hard candy. ? 4 oz (120 mL) of  fruit juice. ? 4 oz (120 mL) of regular (not diet) soda.  Check your blood glucose 15 minutes after you take the carbohydrate.  If the repeat blood glucose level is still at or below 70 mg/dL (3.9 mmol/L), take 15 grams of a carbohydrate again.  If your blood glucose level does not increase above 70 mg/dL (3.9 mmol/L) after 3 tries, seek emergency medical care.  After your blood glucose level returns to normal, eat a meal or a snack within 1 hour.  How do I treat severe hypoglycemia? Severe hypoglycemia is when your blood  glucose level is at or below 54 mg/dL (3 mmol/L). Severe hypoglycemia is an emergency. Do not wait to see if the symptoms will go away. Get medical help right away. Call your local emergency services (911 in the [place] Do not drive yourself to the hospital. If you have severe hypoglycemia and you cannot eat or drink, you may need an injection of glucagon. A family member or close friend should learn how to check your blood glucose and how to give you a glucagon injection. Ask your health care provider if you need to have an emergency glucagon injection kit available. Severe hypoglycemia may need to be treated in a hospital. The treatment may include getting glucose through an IV tube. You may also need treatment for the cause of your hypoglycemia. Can having diabetes put me at risk for other conditions? Having diabetes can put you at risk for other long-term (chronic) conditions, such as heart disease and kidney disease. Your health care provider may prescribe medicines to help prevent complications from diabetes. These medicines may include:  Aspirin.  Medicine to lower cholesterol.  Medicine to control blood pressure.  What else can I do to manage my diabetes? Take your diabetes medicines as told  If your health care provider prescribed insulin or diabetes medicines, take them every day.  Do not run out of insulin or other diabetes medicines that you take. Plan ahead  so you always have these available.  If you use insulin, adjust your dosage based on how physically active you are and what foods you eat. Your health care provider will tell you how to adjust your dosage. Make healthy food choices  The things that you eat and drink affect your blood glucose and your insulin dosage. Making good choices helps to control your diabetes and prevent other health problems. A healthy meal plan includes eating lean proteins, complex carbohydrates, fresh fruits and vegetables, low-fat dairy products, and healthy fats. Make an appointment to see a diet and nutrition specialist (registered dietitian) to help you create an eating plan that is right for you. Make sure that you:  Follow instructions from your health care provider about eating or drinking restrictions.  Drink enough fluid to keep your urine clear or pale yellow.  Eat healthy snacks between nutritious meals.  Track the carbohydrates that you eat. Do this by reading food labels and learning the standard serving sizes of foods.  Follow your sick day plan whenever you cannot eat or drink as usual. Make this plan in advance with your health care provider.  Stay active  Exercise regularly, as told by your health care provider. This may include:  Stretching and doing strength exercises, such as yoga or weightlifting, at least 2 times a week.  Doing at least 150 minutes of moderate-intensity or vigorous-intensity exercise each week. This could be brisk walking, biking, or water aerobics. ? Spread out your activity over at least 3 days of the week. ? Do not go more than 2 days in a row without doing some kind of physical activity.  When you start a new exercise or activity, work with your health care provider to adjust your insulin, medicines, or food intake as needed. Make healthy lifestyle choices  Do not use any tobacco products, such as cigarettes, chewing tobacco, and e-cigarettes. If you need help  quitting, ask your health care provider.  If your health care provider says that alcohol is safe for you, limit alcohol intake to no more than 1 drink per  day for nonpregnant women and 2 drinks per day for men. One drink equals 12 oz of beer, 5 oz of wine, or 1 oz of hard liquor.  Learn to manage stress. If you need help with this, ask your health care provider. Care for your body   Keep your immunizations up to date. In addition to getting vaccinations as told by your health care provider, it is recommended that you get vaccinated against the following illnesses: ? The flu (influenza). Get a flu shot every year. ? Pneumonia. ? Hepatitis B.  Schedule an eye exam soon after your diagnosis, and then one time every year after that.  Check your skin and feet every day for cuts, bruises, redness, blisters, or sores. Schedule a foot exam with your health care provider once every year.  Brush your teeth and gums two times a day, and floss at least one time a day. Visit your dentist at least once every 6 months.  Maintain a healthy weight. General instructions  Take over-the-counter and prescription medicines only as told by your health care provider.  Share your diabetes management plan with people in your workplace, school, and household.  Check your urine for ketones when you are ill and as told by your health care provider.  Ask your health care provider: ? Do I need to meet with a diabetes educator? ? Where can I find a support group for people with diabetes?  Carry a medical alert card or wear medical alert jewelry.  Keep all follow-up visits as told by your health care provider. This is important. Where to find more information: For more information about diabetes, visit:  American Diabetes Association (ADA): www.diabetes.org  American Association of Diabetes Educators (AADE): www.diabeteseducator.org/patient-resources  This information is not intended to replace advice  given to you by your health care provider. Make sure you discuss any questions you have with your health care provider. Document Released: 06/29/2015 Document Revised: 08/13/2015 Document Reviewed: 04/10/2015 Elsevier Interactive Patient Education  2017 [place].      Blood Glucose Monitoring, Adult Monitoring your blood sugar (glucose) helps you manage your diabetes. It also helps you and your health care provider determine how well your diabetes management plan is working. Blood glucose monitoring involves checking your blood glucose as often as directed, and keeping a record (log) of your results over time. Why should I monitor my blood glucose? Checking your blood glucose regularly can:  Help you understand how food, exercise, illnesses, and medicines affect your blood glucose.  Let you know what your blood glucose is at any time. You can quickly tell if you are having low blood glucose (hypoglycemia) or high blood glucose (hyperglycemia).  Help you and your health care provider adjust your medicines as needed.  When should I check my blood glucose? Follow instructions from your health care provider about how often to check your blood glucose.   This may depend on:  The type of diabetes you have.  How well-controlled your diabetes is.  Medicines you are taking.  If you have type 1 diabetes:  Check your blood glucose at least 2 times a day.  Also check your blood glucose: ? Before every insulin injection. ? Before and after exercise. ? Between meals. ? 2 hours after a meal. ? Occasionally between 2:00 a.m. and 3:00 a.m., as directed. ? Before potentially dangerous tasks, like driving or using heavy machinery. ? At bedtime.  You may need to check your blood glucose more often, up to  6-10 times a day: ? If you use an insulin pump. ? If you need multiple daily injections (MDI). ? If your diabetes is not well-controlled. ? If you are ill. ? If you have a history of  severe hypoglycemia. ? If you have a history of not knowing when your blood glucose is getting low (hypoglycemia unawareness).  If you have type 2 diabetes:  If you take insulin or other diabetes medicines, check your blood glucose at least 2 times a day.  If you are on intensive insulin therapy, check your blood glucose at least 4 times a day. Occasionally, you may also need to check between 2:00 a.m. and 3:00 a.m., as directed.  Also check your blood glucose: ? Before and after exercise. ? Before potentially dangerous tasks, like driving or using heavy machinery.  You may need to check your blood glucose more often if: ? Your medicine is being adjusted. ? Your diabetes is not well-controlled. ? You are ill.  What is a blood glucose log?  A blood glucose log is a record of your blood glucose readings. It helps you and your health care provider: ? Look for patterns in your blood glucose over time. ? Adjust your diabetes management plan as needed.  Every time you check your blood glucose, write down your result and notes about things that may be affecting your blood glucose, such as your diet and exercise for the day.  Most glucose meters store a record of glucose readings in the meter. Some meters allow you to download your records to a computer. How do I check my blood glucose? Follow these steps to get accurate readings of your blood glucose: Supplies needed   Blood glucose meter.  Test strips for your meter. Each meter has its own strips. You must use the strips that come with your meter.  A needle to prick your finger (lancet). Do not use lancets more than once.  A device that holds the lancet (lancing device).  A journal or log book to write down your results.  Procedure  Wash your hands with soap and water.  Prick the side of your finger (not the tip) with the lancet. Use a different finger each time.  Gently rub the finger until a small drop of blood  appears.  Follow instructions that come with your meter for inserting the test strip, applying blood to the strip, and using your blood glucose meter.  Write down your result and any notes.  Alternative testing sites  Some meters allow you to use areas of your body other than your finger (alternative sites) to test your blood.  If you think you may have hypoglycemia, or if you have hypoglycemia unawareness, do not use alternative sites. Use your finger instead.  Alternative sites may not be as accurate as the fingers, because blood flow is slower in these areas. This means that the result you get may be delayed, and it may be different from the result that you would get from your finger.  The most common alternative sites are: ? Forearm. ? Thigh. ? Palm of the hand.  Additional tips  Always keep your supplies with you.  If you have questions or need help, all blood glucose meters have a 24-hour "hotline" number that you can call. You may also contact your health care provider.  After you use a few boxes of test strips, adjust (calibrate) your blood glucose meter by following instructions that came with your meter.  The American Diabetes Association suggests the following targets for most nonpregnant adults with diabetes.  More or less stringent glycemic goals may be appropriate for each individual.  A1C: Less than 7% A1C may also be reported as eAG: Less than 154 mg/dl Before a meal (preprandial plasma glucose): 80-130 mg/dl 1-2 hours after beginning of the meal (Postprandial plasma glucose)*: Less than 180 mg/dl  *Postprandial glucose may be targeted if A1C goals are not met despite reaching preprandial glucose goals.   GOALS in short:  The goals are for the Hgb A1C to be less than 7.0 & blood pressure to be less than 130/80.    It is recommended that all diabetics are educated on and follow a healthy diabetic diet, exercise for 30 minutes 3-4 times per week (walking,  biking, swimming, or machine), monitor blood glucose readings and bring that record with you to be reviewed at your next office visit.     You should be checking fasting blood sugars- especially after you eat poorly or eat really healthy, and also check 2 hour postprandial blood sugars after largest meal of the day.    Write these down and bring in your log at each office visit.    You will need to be seen every 3 months by the provider managing your Diabetes unless told otherwise by that provider.   You will need yearly eye exams from an eye specialist and foot exams to check the nerves of your feet.  Also, your urine should be checked yearly as well to make sure excess protein is not present.   If you are checking your blood pressure at home, please record it and bring it to your next office visit.    Follow the Dietary Approaches to Stop Hypertension (DASH) diet (3 servings of fruit and vegetables daily, whole grains, low sodium, low-fat proteins).  See below.    Lastly, when it comes to your cholesterol, the goal is to have the HDL (good cholesterol) >40, and the LDL (bad cholesterol) <100.   It is recommended that you follow a heart healthy, low saturated and trans-fat diet and exercise for 30 minutes at least 5 times a week.     (( Check out the DASH diet = 1.5 Gram Low Sodium Diet   A 1.5 gram sodium diet restricts the amount of sodium in the diet to no more than 1.5 g or 1500 mg daily.  The American Heart Association recommends Americans over the age of 72 to consume no more than 1500 mg of sodium each day to reduce the risk of developing high blood pressure.  Research also shows that limiting sodium may reduce heart attack and stroke risk.  Many foods contain sodium for flavor and sometimes as a preservative.  When the amount of sodium in a diet needs to be low, it is important to know what to look for when choosing foods and drinks.  The following includes some information and guidelines to  help make it easier for you to adapt to a low sodium diet.    QUICK TIPS  Do not add salt to food.  Avoid convenience items and fast food.  Choose unsalted snack foods.  Buy lower sodium products, often labeled as "lower sodium" or "no salt added."  Check food labels to learn how much sodium is in 1 serving.  When eating at a restaurant, ask that your food be prepared with less salt or none, if possible.    READING FOOD LABELS FOR  SODIUM INFORMATION  The nutrition facts label is a good place to find how much sodium is in foods. Look for products with no more than 400 mg of sodium per serving.  Remember that 1.5 g = 1500 mg.  The food label may also list foods as:  Sodium-free: Less than 5 mg in a serving.  Very low sodium: 35 mg or less in a serving.  Low-sodium: 140 mg or less in a serving.  Light in sodium: 50% less sodium in a serving. For example, if a food that usually has 300 mg of sodium is changed to become light in sodium, it will have 150 mg of sodium.  Reduced sodium: 25% less sodium in a serving. For example, if a food that usually has 400 mg of sodium is changed to reduced sodium, it will have 300 mg of sodium.    CHOOSING FOODS  Grains  Avoid: Salted crackers and snack items. Some cereals, including instant hot cereals. Bread stuffing and biscuit mixes. Seasoned rice or pasta mixes.  Choose: Unsalted snack items. Low-sodium cereals, oats, puffed wheat and rice, shredded wheat. English muffins and bread. Pasta.  Meats  Avoid: Salted, canned, smoked, spiced, pickled meats, including fish and poultry. Bacon, ham, sausage, cold cuts, hot dogs, anchovies.  Choose: Low-sodium canned tuna and salmon. Fresh or frozen meat, poultry, and fish.  Dairy  Avoid: Processed cheese and spreads. Cottage cheese. Buttermilk and condensed milk. Regular cheese.  Choose: Milk. Low-sodium cottage cheese. Yogurt. Sour cream. Low-sodium cheese.  Fruits and Vegetables  Avoid: Regular canned  vegetables. Regular canned tomato sauce and paste. Frozen vegetables in sauces. Olives. Angie Fava. Relishes. Sauerkraut.  Choose: Low-sodium canned vegetables. Low-sodium tomato sauce and paste. Frozen or fresh vegetables. Fresh and frozen fruit.  Condiments  Avoid: Canned and packaged gravies. Worcestershire sauce. Tartar sauce. Barbecue sauce. Soy sauce. Steak sauce. Ketchup. Onion, garlic, and table salt. Meat flavorings and tenderizers.  Choose: Fresh and dried herbs and spices. Low-sodium varieties of mustard and ketchup. Lemon juice. Tabasco sauce. Horseradish.    SAMPLE 1.5 GRAM SODIUM MEAL PLAN:   Breakfast / Sodium (mg)  1 cup low-fat milk / 143 mg  1 whole-wheat English muffin / 240 mg  1 tbs heart-healthy margarine / 153 mg  1 hard-boiled egg / 139 mg  1 small orange / 0 mg  Lunch / Sodium (mg)  1 cup raw carrots / 76 mg  2 tbs no salt added peanut butter / 5 mg  2 slices whole-wheat bread / 270 mg  1 tbs jelly / 6 mg   cup red grapes / 2 mg  Dinner / Sodium (mg)  1 cup whole-wheat pasta / 2 mg  1 cup low-sodium tomato sauce / 73 mg  3 oz lean ground beef / 57 mg  1 small side salad (1 cup raw spinach leaves,  cup cucumber,  cup yellow bell pepper) with 1 tsp olive oil and 1 tsp red wine vinegar / 25 mg  Snack / Sodium (mg)  1 container low-fat vanilla yogurt / 107 mg  3 graham cracker squares / 127 mg  Nutrient Analysis  Calories: 1745  Protein: 75 g  Carbohydrate: 237 g  Fat: 57 g  Sodium: 1425 mg  Document Released: 03/07/2005 Document Revised: 11/17/2010 Document Reviewed: 06/08/2009  ExitCare Patient Information 2012 [place].))    This information is not intended to replace advice given to you by your health care provider. Make sure you discuss any questions you have with  your health care provider. Document Released: 03/10/2003 Document Revised: 09/25/2015 Document Reviewed: 08/17/2015 Elsevier Interactive Patient Education  2017 [place].         - Please realize, EXERCISE IS MEDICINE!  -  American Heart Association ( AHA) guidelines for exercise : If you are in good health, without any medical conditions, you should engage in 150 minutes of moderate intensity aerobic activity per week.  This means you should be huffing and puffing throughout your workout.   Engaging in regular exercise will improve brain function and memory, as well as improve mood, boost immune system and help with weight management.  As well as the other, more well-known effects of exercise such as decreasing blood sugar levels, decreasing blood pressure,  and decreasing bad cholesterol levels/ increasing good cholesterol levels.     -  The AHA strongly endorses consumption of a diet that contains a variety of foods from all the food categories with an emphasis on fruits and vegetables; fat-free and low-fat dairy products; cereal and grain products; legumes and nuts; and fish, poultry, and/or extra lean meats.    Excessive food intake, especially of foods high in saturated and trans fats, sugar, and salt, should be avoided.    Adequate water intake of roughly 1/2 of your weight in pounds, should equal the ounces of water per day you should drink.  So for instance, if you're 200 pounds, that would be 100 ounces of water per day.         Mediterranean Diet  Why follow it? Research shows. . Those who follow the Mediterranean diet have a reduced risk of heart disease  . The diet is associated with a reduced incidence of Parkinson's and Alzheimer's diseases . People following the diet may have longer life expectancies and lower rates of chronic diseases  . The Dietary Guidelines for Americans recommends the Mediterranean diet as an eating plan to promote health and prevent disease  What Is the Mediterranean Diet?  . Healthy eating plan based on typical foods and recipes of Mediterranean-style cooking . The diet is primarily a plant based diet; these  foods should make up a majority of meals   Starches - Plant based foods should make up a majority of meals - They are an important sources of vitamins, minerals, energy, antioxidants, and fiber - Choose whole grains, foods high in fiber and minimally processed items  - Typical grain sources include wheat, oats, barley, corn, brown rice, bulgar, farro, millet, polenta, couscous  - Various types of beans include chickpeas, lentils, fava beans, black beans, white beans   Fruits  Veggies - Large quantities of antioxidant rich fruits & veggies; 6 or more servings  - Vegetables can be eaten raw or lightly drizzled with oil and cooked  - Vegetables common to the traditional Mediterranean Diet include: artichokes, arugula, beets, broccoli, brussel sprouts, cabbage, carrots, celery, collard greens, cucumbers, eggplant, kale, leeks, lemons, lettuce, mushrooms, okra, onions, peas, peppers, potatoes, pumpkin, radishes, rutabaga, shallots, spinach, sweet potatoes, turnips, zucchini - Fruits common to the Mediterranean Diet include: apples, apricots, avocados, cherries, clementines, dates, figs, grapefruits, grapes, melons, nectarines, oranges, peaches, pears, pomegranates, strawberries, tangerines  Fats - Replace butter and margarine with healthy oils, such as olive oil, canola oil, and tahini  - Limit nuts to no more than a handful a day  - Nuts include walnuts, almonds, pecans, pistachios, pine nuts  - Limit or avoid candied, honey roasted or heavily salted nuts - Olives are central to  the Mediterranean diet - can be eaten whole or used in a variety of dishes   Meats Protein - Limiting red meat: no more than a few times a month - When eating red meat: choose lean cuts and keep the portion to the size of deck of cards - Eggs: approx. 0 to 4 times a week  - Fish and lean poultry: at least 2 a week  - Healthy protein sources include, chicken, [place], lean beef, lamb - Increase intake of seafood such as tuna,  salmon, trout, mackerel, shrimp, scallops - Avoid or limit high fat processed meats such as sausage and bacon  Dairy - Include moderate amounts of low fat dairy products  - Focus on healthy dairy such as fat free yogurt, skim milk, low or reduced fat cheese - Limit dairy products higher in fat such as whole or 2% milk, cheese, ice cream  Alcohol - Moderate amounts of red wine is ok  - No more than 5 oz daily for women (all ages) and men older than age 74  - No more than 10 oz of wine daily for men younger than 40  Other - Limit sweets and other desserts  - Use herbs and spices instead of salt to flavor foods  - Herbs and spices common to the traditional Mediterranean Diet include: basil, bay leaves, chives, cloves, cumin, fennel, garlic, lavender, marjoram, mint, oregano, parsley, pepper, rosemary, sage, savory, sumac, tarragon, thyme   It's not just a diet, it's a lifestyle:  . The Mediterranean diet includes lifestyle factors typical of those in the region  . Foods, drinks and meals are best eaten with others and savored . Daily physical activity is important for overall good health . This could be strenuous exercise like running and aerobics . This could also be more leisurely activities such as walking, housework, yard-work, or taking the stairs . Moderation is the key; a balanced and healthy diet accommodates most foods and drinks . Consider portion sizes and frequency of consumption of certain foods   Meal Ideas & Options:  . Breakfast:  o Whole wheat toast or whole wheat English muffins with peanut butter & hard boiled egg o Steel cut oats topped with apples & cinnamon and skim milk  o Fresh fruit: banana, strawberries, melon, berries, peaches  o Smoothies: strawberries, bananas, greek yogurt, peanut butter o Low fat greek yogurt with blueberries and granola  o Egg white omelet with spinach and mushrooms o Breakfast couscous: whole wheat couscous, apricots, skim milk, cranberries   . Sandwiches:  o Hummus and grilled vegetables (peppers, zucchini, squash) on whole wheat bread   o Grilled chicken on whole wheat pita with lettuce, tomatoes, cucumbers or tzatziki  o Tuna salad on whole wheat bread: tuna salad made with greek yogurt, olives, red peppers, capers, green onions o Garlic rosemary lamb pita: lamb sauted with garlic, rosemary, salt & pepper; add lettuce, cucumber, greek yogurt to pita - flavor with lemon juice and black pepper  . Seafood:  o Mediterranean grilled salmon, seasoned with garlic, basil, parsley, lemon juice and black pepper o Shrimp, lemon, and spinach whole-grain pasta salad made with low fat greek yogurt  o Seared scallops with lemon orzo  o Seared tuna steaks seasoned salt, pepper, coriander topped with tomato mixture of olives, tomatoes, olive oil, minced garlic, parsley, green onions and cappers  . Meats:  o Herbed greek chicken salad with kalamata olives, cucumber, feta  o Red bell peppers stuffed with spinach, bulgur, lean  ground beef (or lentils) & topped with feta   o Kebabs: skewers of chicken, tomatoes, onions, zucchini, squash  o [place] burgers: made with red onions, mint, dill, lemon juice, feta cheese topped with roasted red peppers . Vegetarian o Cucumber salad: cucumbers, artichoke hearts, celery, red onion, feta cheese, tossed in olive oil & lemon juice  o Hummus and whole grain pita points with a greek salad (lettuce, tomato, feta, olives, cucumbers, red onion) o Lentil soup with celery, carrots made with vegetable broth, garlic, salt and pepper  o Tabouli salad: parsley, bulgur, mint, scallions, cucumbers, tomato, radishes, lemon juice, olive oil, salt and pepper.

## 2017-07-21 ENCOUNTER — Other Ambulatory Visit (INDEPENDENT_AMBULATORY_CARE_PROVIDER_SITE_OTHER): Payer: BLUE CROSS/BLUE SHIELD

## 2017-07-21 DIAGNOSIS — M150 Primary generalized (osteo)arthritis: Secondary | ICD-10-CM

## 2017-07-21 DIAGNOSIS — N183 Chronic kidney disease, stage 3 (moderate): Secondary | ICD-10-CM

## 2017-07-21 DIAGNOSIS — M159 Polyosteoarthritis, unspecified: Secondary | ICD-10-CM

## 2017-07-21 DIAGNOSIS — I10 Essential (primary) hypertension: Secondary | ICD-10-CM

## 2017-07-21 DIAGNOSIS — E785 Hyperlipidemia, unspecified: Secondary | ICD-10-CM

## 2017-07-21 DIAGNOSIS — I152 Hypertension secondary to endocrine disorders: Secondary | ICD-10-CM

## 2017-07-21 DIAGNOSIS — E1165 Type 2 diabetes mellitus with hyperglycemia: Secondary | ICD-10-CM

## 2017-07-21 DIAGNOSIS — E0822 Diabetes mellitus due to underlying condition with diabetic chronic kidney disease: Secondary | ICD-10-CM

## 2017-07-21 DIAGNOSIS — E1159 Type 2 diabetes mellitus with other circulatory complications: Secondary | ICD-10-CM

## 2017-07-22 LAB — CBC WITH DIFFERENTIAL/PLATELET
Basophils Absolute: 0 10*3/uL (ref 0.0–0.2)
Basos: 0 %
EOS (ABSOLUTE): 0.2 10*3/uL (ref 0.0–0.4)
Eos: 3 %
Hematocrit: 37.4 % (ref 34.0–46.6)
Hemoglobin: 12.6 g/dL (ref 11.1–15.9)
IMMATURE GRANULOCYTES: 0 %
Immature Grans (Abs): 0 10*3/uL (ref 0.0–0.1)
LYMPHS ABS: 2 10*3/uL (ref 0.7–3.1)
Lymphs: 32 %
MCH: 31 pg (ref 26.6–33.0)
MCHC: 33.7 g/dL (ref 31.5–35.7)
MCV: 92 fL (ref 79–97)
MONOS ABS: 0.7 10*3/uL (ref 0.1–0.9)
Monocytes: 10 %
NEUTROS PCT: 55 %
Neutrophils Absolute: 3.4 10*3/uL (ref 1.4–7.0)
PLATELETS: 303 10*3/uL (ref 150–379)
RBC: 4.07 x10E6/uL (ref 3.77–5.28)
RDW: 13.3 % (ref 12.3–15.4)
WBC: 6.3 10*3/uL (ref 3.4–10.8)

## 2017-07-22 LAB — TSH: TSH: 0.841 u[IU]/mL (ref 0.450–4.500)

## 2017-07-22 LAB — COMPREHENSIVE METABOLIC PANEL
A/G RATIO: 1.2 (ref 1.2–2.2)
ALK PHOS: 94 IU/L (ref 39–117)
ALT: 9 IU/L (ref 0–32)
AST: 5 IU/L (ref 0–40)
Albumin: 3.6 g/dL (ref 3.5–5.5)
BUN/Creatinine Ratio: 9 (ref 9–23)
BUN: 8 mg/dL (ref 6–24)
Bilirubin Total: 0.4 mg/dL (ref 0.0–1.2)
CO2: 28 mmol/L (ref 20–29)
CREATININE: 0.87 mg/dL (ref 0.57–1.00)
Calcium: 9.2 mg/dL (ref 8.7–10.2)
Chloride: 101 mmol/L (ref 96–106)
GFR calc Af Amer: 86 mL/min/{1.73_m2} (ref >59)
GFR calc non Af Amer: 75 mL/min/{1.73_m2} (ref >59)
GLOBULIN, TOTAL: 3 g/dL (ref 1.5–4.5)
Glucose: 283 mg/dL — ABNORMAL HIGH (ref 65–99)
Potassium: 4.7 mmol/L (ref 3.5–5.2)
Sodium: 139 mmol/L (ref 134–144)
Total Protein: 6.6 g/dL (ref 6.0–8.5)

## 2017-07-22 LAB — HEMOGLOBIN A1C
ESTIMATED AVERAGE GLUCOSE: 243 mg/dL
HEMOGLOBIN A1C: 10.1 % — AB (ref 4.8–5.6)

## 2017-07-22 LAB — LIPID PANEL
Chol/HDL Ratio: 3.6 ratio (ref 0.0–4.4)
Cholesterol, Total: 127 mg/dL (ref 100–199)
HDL: 35 mg/dL — AB (ref >39)
LDL Calculated: 72 mg/dL (ref 0–99)
TRIGLYCERIDES: 102 mg/dL (ref 0–149)
VLDL Cholesterol Cal: 20 mg/dL (ref 5–40)

## 2017-07-22 LAB — T4, FREE: Free T4: 1.37 ng/dL (ref 0.82–1.77)

## 2017-07-22 LAB — VITAMIN D 25 HYDROXY (VIT D DEFICIENCY, FRACTURES): Vit D, 25-Hydroxy: 38.5 ng/mL (ref 30.0–100.0)

## 2017-07-28 ENCOUNTER — Telehealth: Payer: Self-pay

## 2017-07-28 ENCOUNTER — Encounter: Payer: Self-pay | Admitting: Registered"

## 2017-07-28 ENCOUNTER — Encounter: Payer: BLUE CROSS/BLUE SHIELD | Attending: Family Medicine | Admitting: Registered"

## 2017-07-28 DIAGNOSIS — Z713 Dietary counseling and surveillance: Secondary | ICD-10-CM | POA: Insufficient documentation

## 2017-07-28 DIAGNOSIS — E1165 Type 2 diabetes mellitus with hyperglycemia: Secondary | ICD-10-CM

## 2017-07-28 NOTE — Progress Notes (Signed)
Diabetes Self-Management Education  Visit Type: First/Initial  Appt. Start Time: 1015 Appt. End Time: 1130  07/28/2017  Julie Prince, identified by name and date of birth, is a 57 y.o. female with a diagnosis of Diabetes: Type 2.   ASSESSMENT Patient states she has had DM a long time and the doctor she had until recently did not educate her on what she can do to bring her BG down. Pt states she recently started seeing Dr. Raliegh Scarlet and feels better with her care. RD believes patient misunderstood conversation with Dr. Raliegh Scarlet regarding her insulin prescription. Pt states her doctor said that the insulin wasn't working to bring down her blood sugar and wanted her to see an endocrinologist. Patient interpreted that as if it wasn't working she should just stop taking it, especially since it was causing severe heart burn and vomiting. Patient states she has an appointment with Dr. Buddy Duty in Sept.   Pt states when she was taking the insulin her BG was in the 300s and since she has stopped, her fasting BG are 450-600 mg/dL. Patient states that she feels much better since she stopped taking the insulin, heart burn and vomiting stopped. RD called Pt's primary care office because their office was scheduled to close soon and RD wanted to alert her PCP of the situation. See Telephone encounter from patient to [facility] 07/28/17. Pt has appointment Monday at 9:15 am.  When reviewing the medications RD saw in notes that patient is taking Vitamin D differently than prescribed and verified with patient that it was accurate. Patient confirms that she is still taking the prescribed 50,000 units and states on the bottle it instructs to take it 2 times per week. Patient was advised to recheck her prescription and if it does have 2 times per week, check with her doctor to make sure this is correct because that is a very high dose of vitamin D. Per labs last week her Vit D level is 38.5.  Patient states she has very  scheduled life and eats meals and snacks at same time each day except she may skip lunch. Pt states she tries to eat before 6 pm. Pt states she is trying to lose weight and said recently has lost 10 lbs. RD doesn't believe there have been significant lifestyle changes for this and could be related to her high blood sugar.  At end of visit patient asked about ways to get more fiber in the diet. RD provided a few quick pointers, at next visit RD can discuss this further.   Relevant PMH/problem list: GERD / Barrett's esophagus, DM peripheral neuropathy, Hyperlipidemia  Diabetes Self-Management Education - 07/28/17 1028      Visit Information   Visit Type  First/Initial      Initial Visit   Diabetes Type  Type 2    Are you currently following a meal plan?  No    Are you taking your medications as prescribed?  No no DM medications due to side effects    Date Diagnosed  15 yrs ago      Health Coping   How would you rate your overall health?  Fair      Psychosocial Assessment   Patient Belief/Attitude about Diabetes  Other (comment) doesnt want to have it    What is the last grade level you completed in school?  12      Complications   Last HgB A1C per patient/outside source  10.1 % 10.1% 07/21/17 up from  9.5% 1 week prior    How often do you check your blood sugar?  1-2 times/day    Fasting Blood glucose range (mg/dL)  >200    Postprandial Blood glucose range (mg/dL)  >200    Number of hypoglycemic episodes per month  0    Number of hyperglycemic episodes per week  -- always over 400 mg/dL    Have you had a dilated eye exam in the past 12 months?  Yes    Have you had a dental exam in the past 12 months?  Yes    Are you checking your feet?  Yes    How many days per week are you checking your feet?  7      Dietary Intake   Breakfast  1 toast, egg white, 2 bacon, coffee cream 1 T sugar in the raw (doesn't use white sugar)    Snack (morning)  fruit OR nuts    Lunch  skips    Snack  (afternoon)  muchos chips    Dinner  Chicken, cabbage, yellow rice, corn bread, sweet tea (chick fil-a buys by the gallon)    Snack (evening)  grapes    Beverage(s)  4x 16 oz bottle water, sweet tea, coffee (stays off soda) ginger ale when sick, 100% juice 8 oz 1x week      Exercise   Exercise Type  Light (walking / raking leaves)    How many days per week to you exercise?  3    How many minutes per day do you exercise?  60    Total minutes per week of exercise  180      Patient Education   Previous Diabetes Education  Yes (please comment)    Disease state   Definition of diabetes, type 1 and 2, and the diagnosis of diabetes    Nutrition management   Role of diet in the treatment of diabetes and the relationship between the three main macronutrients and blood glucose level    Physical activity and exercise   Role of exercise on diabetes management, blood pressure control and cardiac health.    Monitoring  Identified appropriate SMBG and/or A1C goals.      Individualized Goals (developed by patient)   Nutrition  Other (comment) immediately stop drinking sweetened drinks    Medications  Other (comment) contact PCP immediately for medication management      Outcomes   Expected Outcomes  Demonstrated interest in learning. Expect positive outcomes    Future DMSE  4-6 wks    Program Status  Not Completed     Individualized Plan for Diabetes Self-Management Training:   Learning Objective:  Patient will have a greater understanding of diabetes self-management. Patient education plan is to attend individual and/or group sessions per assessed needs and concerns.   Patient Instructions   Continue to check your fasting blood sugar.  Contact your doctor today about your blood sugar.  Also tell her that you have been taking your 50,000 vit D 2 times per week. In the computer it says you should be taking 1 x per week.  To help bring your sugar down, consider cutting out all beverages that  have added sugar including sweet tea, juice, etc.  Also for now cut back your carbs using MyPlate as a guide.  Whenever eating carbs also include protein  Consider instead of using a butter use a spread with Plant Stenols (Benecol is one brand) which can help reduce cholesterol. To get more  fiber, consider:  Using whole grain bread, more non-starchy vegetables, include more beans and lentils. You could also have a fiber one bar as a snack.  Try to not skip meals. If you need something quick/on the go you can do a supplement like Boost glucose control or something similar.  Expected Outcomes:  Demonstrated interest in learning. Expect positive outcomes  Education material provided: AND Diabetes: Your Take Control Guide, Snack sheet, Cholesterol & Triglycerides, Types of Fat  If problems or questions, patient to contact team via:  Phone  Future DSME appointment: 4-6 wks

## 2017-07-28 NOTE — Telephone Encounter (Signed)
Patient went for her appointment with dietary services and glucose is running 400-600, patient in not currently taking insulin. Patient denies any any symptoms.  Advised patient that she needs to go to ER or [facility] if sugar levels remain elevated and if patient develops any symptoms of confusion, fatigue, dizziness, vision changes, or loc to call 911.  Appointment made for Monday with Dr. Raliegh Scarlet. MPulliam, CMA/RT(R)

## 2017-07-28 NOTE — Patient Instructions (Addendum)
   Continue to check your fasting blood sugar.  Contact your doctor today about your blood sugar.  Also tell her that you have been taking your 50,000 vit D 2 times per week. In the computer it says you should be taking 1 x per week.  To help bring your sugar down, consider cutting out all beverages that have added sugar including sweet tea, juice, etc.  Also for now cut back your carbs using MyPlate as a guide.  Whenever eating carbs also include protein  Consider instead of using a butter use a spread with Plant Stenols (Benecol is one brand) which can help reduce cholesterol. To get more fiber, consider:  Using whole grain bread, more non-starchy vegetables, include more beans and lentils. You could also have a fiber one bar as a snack.  Try to not skip meals. If you need something quick/on the go you can do a supplement like Boost glucose control or something similar.

## 2017-07-31 ENCOUNTER — Telehealth: Payer: Self-pay | Admitting: Family Medicine

## 2017-07-31 ENCOUNTER — Other Ambulatory Visit: Payer: Self-pay | Admitting: Family Medicine

## 2017-07-31 ENCOUNTER — Ambulatory Visit (INDEPENDENT_AMBULATORY_CARE_PROVIDER_SITE_OTHER): Payer: BLUE CROSS/BLUE SHIELD | Admitting: Family Medicine

## 2017-07-31 VITALS — BP 182/96 | HR 91 | Ht 64.0 in | Wt 227.9 lb

## 2017-07-31 DIAGNOSIS — E118 Type 2 diabetes mellitus with unspecified complications: Secondary | ICD-10-CM

## 2017-07-31 DIAGNOSIS — I10 Essential (primary) hypertension: Secondary | ICD-10-CM | POA: Diagnosis not present

## 2017-07-31 DIAGNOSIS — E1159 Type 2 diabetes mellitus with other circulatory complications: Secondary | ICD-10-CM

## 2017-07-31 DIAGNOSIS — Z794 Long term (current) use of insulin: Secondary | ICD-10-CM | POA: Diagnosis not present

## 2017-07-31 DIAGNOSIS — I152 Hypertension secondary to endocrine disorders: Secondary | ICD-10-CM

## 2017-07-31 MED ORDER — INSULIN DEGLUDEC 100 UNIT/ML ~~LOC~~ SOPN: 10.0000 [IU] | PEN_INJECTOR | Freq: Every day | SUBCUTANEOUS | 1 refills | Status: DC

## 2017-07-31 MED ORDER — SEMAGLUTIDE (1 MG/DOSE) 2 MG/1.5ML ~~LOC~~ SOPN: PEN_INJECTOR | SUBCUTANEOUS | 0 refills | Status: DC

## 2017-07-31 NOTE — Patient Instructions (Signed)
Please see last AVS as well for information about last office visit.    Remember to always look at your after visit summary and make sure the medication list is accurate and actually what you are taking.  Please call the office if there is any discrepancies and have 1 of our medical assistant update your list if needed.  -We are adding a once daily insulin as well as a once weekly medicine for your diabetes also.  Please keep tight control of your blood sugars and let pressures and log them and bring it in 4 weeks.  -As discussed last time you are supposed to be on 2 blood pressure medicines amlodipine as well as the carvedilol.  Please take them as instructed.  -

## 2017-07-31 NOTE — Telephone Encounter (Signed)
Patient called states went to pharmacy to pick up Rx but was told her Health Ins denied / Will not Cover Rx---  Semaglutide (OZEMPIC) 1 MG/DOSE Bonney Aid [[id number]]   Order Details  Dose, Route, Frequency: As Directed   Dispense Quantity: 3 pen Refills: 0 Fills remaining: --        Sig: Inject 0.25 mg into the skin every 7 (seven) days for 30 days, THEN 0.5 mg every 7 (seven) days. Needs OV prior to refill.          Pharmacy Rx sent to:  [facility] #:  [id number]   [id number] - [place], [place] [phone] (Phone) [phone] (Fax

## 2017-07-31 NOTE — Progress Notes (Signed)
Pt here for an acute care OV today   Impression and Recommendations:    1. Type 2 diabetes mellitus with complication, with long-term current use of insulin ([place])   2. Hypertension associated with diabetes ([place])     1. Diabetes Mellitus with Acutely High Blood Sugar - Thoroughly discussed the dangers of high blood sugar with the patient, and the critical importance of lowering them.   - Advised patient that at her current sugars, if she begins feeling nauseated, having stomach pains, or any other signs of diabetic ketoacidosis, she should immediately go to the hospital.  - Insulin recommended and prescribed to the patient today.  Patient would prefer once-weekly basal insulin.  Advised patient to take insulin at night.  Added insulin and GLP-1 inhibitor to patient's med list today.  - Emphasized that medications COULD cause the patient to feel sick if she is not eating according to dietary recommendations.  - Advised patient of side-effects and potential risks and benefits associated with starting new medications.  Advised patient at length that she will feel strong unpleasant side-effects on her medicines if she is eating poorly/not according to diabetic nutritional recommendations.  - Advised patient to exercise and eat appropriately to combat nausea.  Reviewed at length that if she eats lots of vegetables, lean proteins, and cuts back on carb-rich foods, then she will not feel as sick on the medications.   - Strongly advised the patient to limit carb intake.  - Patient will continue on amlodipine and carvedilol as instructed.    - Continue keeping a log of blood sugars and blood pressures daily.  - Follow-Up in 4 weeks with blood pressure and blood sugar log.  Meds ordered this encounter  Medications  . insulin degludec (TRESIBA) 100 UNIT/ML SOPN FlexTouch Pen    Sig: Inject 0.1 mLs (10 Units total) into the skin daily at 10 pm. Check FBS and 2 hr PP- log them    Dispense:   5 pen    Refill:  1  . Semaglutide (OZEMPIC) 1 MG/DOSE SOPN    Sig: Inject 0.25 mg into the skin every 7 (seven) days for 30 days, THEN 0.5 mg every 7 (seven) days. Needs OV prior to refill.    Dispense:  3 pen    Refill:  0     Education and routine counseling performed. Handouts provided  Gross side effects, risk and benefits, and alternatives of medications and treatment plan in general discussed with patient.  Patient is aware that all medications have potential side effects and we are unable to predict every side effect or drug-drug interaction that may occur.   Patient will call with any questions prior to using medication if they have concerns.  Expresses verbal understanding and consents to current therapy and treatment regimen.  No barriers to understanding were identified.  Red flag symptoms and signs discussed in detail.  Patient expressed understanding regarding what to do in case of emergency\urgent symptoms   Please see AVS handed out to patient at the end of our visit for further patient instructions/ counseling done pertaining to today's office visit.   Return in about 1 month (around 08/28/2017) for started insulin and GLP1; start taking both BP meds.     Note: This document was prepared occasionally using Dragon voice recognition software and may include unintentional dictation errors in addition to a scribe.  This document serves as a record of services personally performed by Mellody Dance, DO. It was created  on her behalf by Toni Amend, a trained medical scribe. The creation of this record is based on the scribe's personal observations and the provider's statements to them.   I have reviewed the above medical documentation for accuracy and completeness and I concur.  Mellody Dance 07/31/17 4:58  PM   --------------------------------------------------------------------------------------------------------------------------------------------------    Subjective:    CC:  Chief Complaint  Patient presents with  . Hyperglycemia    HPI: Madason Rauls is a 57 y.o. female who presents to [place] at [facility] today for issues as discussed below.  Patient notes that her old insulin was making her sick, so she stopped taking it.  DM HPI:  -  She has been attempting to work on diet and exercise for diabetes.  Went to nutritionist on Friday, 3 days ago, and was told she was on the right track.  However, patient notes that she is not currently taking any medications for diabetes.  Home glucose readings range - 500's-600's. This morning before she ate anything, her sugar was 551.  Medication compliance - Patient has not been taking her medications "because they make her feel sick."   Denies polyuria/polydipsia.  Denies hypo/ hyperglycemia symptoms  Last diabetic eye exam was No results found for: HMDIABEYEEXA  Foot exam- UTD  Last A1C in the office was:  Lab Results  Component Value Date   HGBA1C 10.1 (H) 07/21/2017   HGBA1C 9.5 07/12/2017   HGBA1C 10.0 03/01/2016    Lab Results  Component Value Date   LDLCALC 72 07/21/2017   CREATININE 0.87 07/21/2017    Wt Readings from Last 3 Encounters:  07/31/17 227 lb 14.4 oz (103.4 kg)  07/12/17 227 lb 6.4 oz (103.1 kg)  04/12/17 231 lb (104.8 kg)    BP Readings from Last 3 Encounters:  07/31/17 (!) 182/96  07/12/17 (!) 188/107  06/30/17 (!) 179/80     No problems updated.   Wt Readings from Last 3 Encounters:  07/31/17 227 lb 14.4 oz (103.4 kg)  07/12/17 227 lb 6.4 oz (103.1 kg)  04/12/17 231 lb (104.8 kg)   BP Readings from Last 3 Encounters:  07/31/17 (!) 182/96  07/12/17 (!) 188/107  06/30/17 (!) 179/80   BMI Readings from Last 3 Encounters:  07/31/17 39.12 kg/m  07/12/17 39.03 kg/m   04/12/17 39.65 kg/m     Patient Care Team    Relationship Specialty Notifications Start End  Mellody Dance, DO PCP - General Family Medicine  07/12/17   Pyrtle, Lajuan Lines, MD Consulting Physician Gastroenterology  07/13/17   Delrae Rend, MD Consulting Physician Endocrinology  07/13/17   Emily Filbert, MD Consulting Physician Obstetrics and Gynecology  07/13/17      Patient Active Problem List   Diagnosis Date Noted  . Diabetes mellitus due to underlying condition with stage 3 chronic kidney disease, without long-term current use of insulin ([place]) 07/12/2017  . Mixed diabetic hyperlipidemia associated with type 2 diabetes mellitus ([place]) 07/12/2017  . Hypertension associated with diabetes ([place]) 07/12/2017  . Obesity, Class III, morbid obesity  ([place]) 07/12/2017  . H/O medication noncompliance 07/12/2017  . Barrett's esophagus 03/11/2016  . 1st degree AV block 03/01/2016  . Tachycardia 03/01/2016  . Dysphagia 02/10/2016  . Bloating 02/10/2016  . Constipation 02/10/2016  . Vulvar abscess   . Abscess 06/21/2015  . Sepsis ([place]) 06/21/2015  . GERD (gastroesophageal reflux disease) 07/31/2014  . Adenomatous polyp of colon 04/03/2014  . Allergic rhinitis 01/23/2014  . CTS (carpal  tunnel syndrome) 10/03/2013  . Uncontrolled type 2 diabetes mellitus with hyperglycemia ([place]) 10/03/2013  . Low back pain 10/03/2013  . Lumbar arthropathy 10/03/2013  . Lumbar degenerative disc disease 10/03/2013  . Neuropathy 10/03/2013  . Osteoarthritis 10/03/2013  . 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 Tenosynovitis of foot 10/03/2013  . Type 2 diabetes mellitus ([place]) 09/12/2013  . Hyperlipidemia 09/12/2013  . Essential hypertension, benign 09/12/2013  . Diabetic peripheral neuropathy associated with type 2 diabetes mellitus ([place]) 09/12/2013    Past Medical history, Surgical history, Family history, Social history, Allergies and Medications have been entered into the medical record, reviewed and changed  as needed.    Current Meds  Medication Sig  . aspirin 81 MG chewable tablet Chew by mouth daily.  Marland Kitchen atorvastatin (LIPITOR) 40 MG tablet Take 40 mg by mouth daily.  . carvedilol (COREG) 6.25 MG tablet Take 1 tablet (6.25 mg total) by mouth 2 (two) times daily with a meal.  . Vitamin D, Ergocalciferol, (DRISDOL) 50000 units CAPS capsule TAKE 1 CAPSULE (50,000 UNITS TOTAL) BY MOUTH EVERY 7 (SEVEN) DAYS. TAKE FOR 8 TOTAL DOSES(WEEKS) (Patient taking differently: Take 50,000 Units by mouth 2 (two) times a week. Take for 8 total doses(weeks))   Current Facility-Administered Medications for the 07/31/17 encounter (Office Visit) with Mellody Dance, DO  Medication  . 0.9 %  sodium chloride infusion    Allergies:  Allergies  Allergen Reactions  . Penicillins Hives and Shortness Of Breath  . Metformin And Related Diarrhea     Review of Systems: General:   Denies fever, chills, unexplained weight loss.  Optho/Auditory:   Denies visual changes, blurred vision/LOV Respiratory:   Denies wheeze, DOE more than baseline levels.  Cardiovascular:   Denies chest pain, palpitations, new onset peripheral edema  Gastrointestinal:   Denies nausea, vomiting, diarrhea, abd pain.  Genitourinary: Denies dysuria, freq/ urgency, flank pain or discharge from genitals.  Endocrine:     Denies hot or cold intolerance, polyuria, polydipsia. Musculoskeletal:   Denies unexplained myalgias, joint swelling, unexplained arthralgias, gait problems.  Skin:  Denies new onset rash, suspicious lesions Neurological:     Denies dizziness, unexplained weakness, numbness  Psychiatric/Behavioral:   Denies mood changes, suicidal or homicidal ideations, hallucinations    Objective:   Blood pressure (!) 182/96, pulse 91, height 5\' 4"  (1.626 m), weight 227 lb 14.4 oz (103.4 kg), SpO2 98 %. Body mass index is 39.12 kg/m. General:  Well Developed, well nourished, appropriate for stated age.  Neuro:  Alert and oriented,   extra-ocular muscles intact  HEENT:  Normocephalic, atraumatic, neck supple Skin:  no gross rash, warm, pink. Cardiac:  RRR, S1 S2 Respiratory:  ECTA B/L and A/P, Not using accessory muscles, speaking in full sentences- unlabored. Vascular:  Ext warm, no cyanosis apprec.; cap RF less 2 sec. Psych:  No HI/SI, judgement and insight good, Euthymic mood. Full Affect.

## 2017-08-01 NOTE — Telephone Encounter (Signed)
Medication requires PA - this done through covermymeds and has been approved.  Approval faxed to the pharmacy.  Patient notified.  MPulliam, CMA/RT(R)

## 2017-08-11 ENCOUNTER — Other Ambulatory Visit: Payer: Self-pay

## 2017-08-11 DIAGNOSIS — E118 Type 2 diabetes mellitus with unspecified complications: Secondary | ICD-10-CM

## 2017-08-11 DIAGNOSIS — Z794 Long term (current) use of insulin: Principal | ICD-10-CM

## 2017-08-11 MED ORDER — INSULIN PEN NEEDLE 31G X 5 MM MISC
11 refills | Status: DC
Start: 2017-08-11 — End: 2018-04-20

## 2017-08-11 NOTE — Progress Notes (Unsigned)
RX for pen needle. MPulliam, CMA/RT(R)

## 2017-08-21 ENCOUNTER — Encounter: Payer: Self-pay | Admitting: Family Medicine

## 2017-08-21 ENCOUNTER — Ambulatory Visit (INDEPENDENT_AMBULATORY_CARE_PROVIDER_SITE_OTHER): Payer: BLUE CROSS/BLUE SHIELD | Admitting: Family Medicine

## 2017-08-21 VITALS — BP 134/85 | HR 118 | Ht 64.0 in | Wt 223.4 lb

## 2017-08-21 DIAGNOSIS — I10 Essential (primary) hypertension: Secondary | ICD-10-CM

## 2017-08-21 DIAGNOSIS — E1159 Type 2 diabetes mellitus with other circulatory complications: Secondary | ICD-10-CM

## 2017-08-21 DIAGNOSIS — E118 Type 2 diabetes mellitus with unspecified complications: Secondary | ICD-10-CM | POA: Diagnosis not present

## 2017-08-21 DIAGNOSIS — E0822 Diabetes mellitus due to underlying condition with diabetic chronic kidney disease: Secondary | ICD-10-CM

## 2017-08-21 DIAGNOSIS — Z794 Long term (current) use of insulin: Secondary | ICD-10-CM | POA: Diagnosis not present

## 2017-08-21 DIAGNOSIS — N183 Chronic kidney disease, stage 3 unspecified: Secondary | ICD-10-CM

## 2017-08-21 MED ORDER — AMLODIPINE BESYLATE 10 MG PO TABS: 5.0000 mg | ORAL_TABLET | Freq: Every day | ORAL | 1 refills | Status: DC

## 2017-08-21 MED ORDER — INSULIN DEGLUDEC 100 UNIT/ML ~~LOC~~ SOPN: 50.0000 [IU] | PEN_INJECTOR | Freq: Every day | SUBCUTANEOUS | 1 refills | Status: DC

## 2017-08-21 NOTE — Progress Notes (Signed)
Impression and Recommendations:    1. Type 2 diabetes mellitus with complication, with long-term current use of insulin ([place])   2. Hypertension associated with diabetes ([place])   3. Diabetes mellitus due to underlying condition with stage 3 chronic kidney disease, without long-term current use of insulin ([name])   4. Obesity, Class III, morbid obesity  ([place])     1. Diabetes Mellitus - Increased insulin from 10 to 30 units every night before bed. - If after five days, her blood sugars are still too high, she will slowly taper up her insulin dose.   - Detailed instructions given to patient.  - Reviewed goal of fasting blood sugar of under 150.  - Counseled the patient that her current medications -Ozempic-  should cause weight loss, and will cause nausea if she consumes the wrong foods and too much of them.  Advised patient that if she's feeling nauseated, she should avoid eating too much.  - Counseled patient on pathophysiology of disease and discussed various treatment options, including dietary and lifestyle modifications as first line.  Importance of low carb/ketogenic diet discussed with patient in addition to regular exercise.   - Has one more appointment with the dietician on June 10th.  Has been having a good experience.  - Continue checking FBS and 2 hours after the biggest meal of your day.  Keep log and bring in next OV for my review.   Also, if you ever feel poorly, please check your blood pressure and blood sugar, as one or the other could be the cause of your symptoms.  - Being a diabetic, you need yearly eye and foot exams. Make appt.for diabetic eye exam   - Risks and benefits of medications reviewed again with patient, including alternative treatments.    2. Hypertension - Lowered dose of medication to 1/2 tab amlodipine (5 mg daily), and increase carvedilol to 1 tablet, twice daily. No dizziness or other sx - Reviewed that goal is to keep her blood pressure low, but  not too low.  Under 130/80, but not too far under 120/70.  - If blood pressure is still low, we will reduce to 2.5 mg of amlodipine daily.  Goal is control of heart rate with carvedilol and inc BP by lowering norvasc.  - Continue to monitor closely.  Ambulatory BP monitoring encouraged.  Keep log and bring in next OV  - Lifestyle changes such as dash diet and engaging in a regular exercise program discussed with patient.   3. Cholesterol Management - Continue taking Lipitor at night before bedtime.  4. BMI Counseling Explained to patient what BMI refers to, and what it means medically.    Told patient to think about it as a "medical risk stratification measurement" and how increasing BMI is associated with increasing risk/ or worsening state of various diseases such as hypertension, hyperlipidemia, diabetes, premature OA, depression etc.  American Heart Association guidelines for healthy diet, basically Mediterranean diet, and exercise guidelines of 30 minutes 5 days per week or more discussed in detail.  Health counseling performed.  All questions answered.  5. Lifestyle & Dietary Habits - Advised patient to continue working toward exercising to improve health.    -PT will begin with 15 minutes of activity daily. Recommended that the patient eventually strive for at least 150 minutes of cardio per week according to the [facility].   - Healthy dietary habits encouraged, including low-carb, and high amounts of lean protein in diet.   -  Patient should also consume adequate amounts of water - half of body weight in oz of water per day   Education and routine counseling performed. Handouts provided.  Pt was in the office today for 32.5+ minutes, with over 50% time spent in face to face counseling of patients various medical conditions, treatment plans of those medical conditions including medicine management and lifestyle modification, strategies to improve health and well being; and in  coordination of care. SEE TREATMENT PLAN FOR DETAILS  6. Follow-Up - Return to her scheduled follow-up in two weeks.  - Otherwise, continue to come in for regularly scheduled follow-up.   Meds ordered this encounter  Medications  . amLODipine (NORVASC) 10 MG tablet    Sig: Take 0.5 tablets (5 mg total) by mouth daily.    Dispense:  45 tablet    Refill:  1  . insulin degludec (TRESIBA FLEXTOUCH) 100 UNIT/ML SOPN FlexTouch Pen    Sig: Inject 0.5 mLs (50 Units total) into the skin daily at 10 pm.    Dispense:  10 pen    Refill:  1    Return for Keep office visit in the near future..   The patient was counseled, risk factors were discussed, anticipatory guidance given.  Gross side effects, risk and benefits, and alternatives of medications discussed with patient.  Patient is aware that all medications have potential side effects and we are unable to predict every side effect or drug-drug interaction that may occur.  Expresses verbal understanding and consents to current therapy plan and treatment regimen.  Please see AVS handed out to patient at the end of our visit for further patient instructions/ counseling done pertaining to today's office visit.    Note: This document was prepared using Dragon voice recognition software and may include unintentional dictation errors.  This document serves as a record of services personally performed by Mellody Dance, DO. It was created on her behalf by Toni Amend, a trained medical scribe. The creation of this record is based on the scribe's personal observations and the provider's statements to them.   I have reviewed the above medical documentation for accuracy and completeness and I concur.  Mellody Dance 08/27/17 8:21 PM     Subjective:    Chief Complaint  Patient presents with  . Follow-up    Julie Prince is a 57 y.o. female who presents to [place] [facility] today for Diabetes Management.     She feels like "so far so good" with her health management.   Feels that she's making progress.  Notes that she's lost five pounds in a month.  Continues taking Lipitor at night before bedtime.  Patient was taking her once-weekly Vitamin D twice a week.  DM HPI: -  She has been working on diet and exercise for diabetes. Patient has been exercising for an hour most days. Trying to learn how to get more vegetables into her diet.  Pt is currently maintained on the following medications for diabetes:   see med list today Tyler Aas, Ozempic, Insulin). Medication compliance - Taking her medication as prescribed. Notes that she often feels nauseated and hungry. Currently giving herself 10 units of insulin every night at 10 o'clock.  Home glucose readings range: Fasting sugars are running from 560 to 402. Notes that she can see her blood sugar coming down now to the 400's.   Denies polyuria/polydipsia. Denies hypo/ hyperglycemia symptoms - She denies new onset of: chest pain, exercise intolerance, shortness of  breath, dizziness, visual changes, headache, lower extremity swelling or claudication.   Last diabetic eye exam was  Lab Results  Component Value Date   HMDIABEYEEXA Retinopathy (A) 07/07/2017    Foot exam- UTD  Last A1C in the office was:  Lab Results  Component Value Date   HGBA1C 10.1 (H) 07/21/2017   HGBA1C 9.5 07/12/2017   HGBA1C 10.0 03/01/2016    Lab Results  Component Value Date   LDLCALC 72 07/21/2017   CREATININE 0.87 07/21/2017    1. HTN HPI:  Notes that her blood pressure this morning was in the 120's/130's over 80's.  -  Her blood pressure has not been controlled at home.  Pt has been checking it at home. Patient believes that her BP measurements may fluctuate due to her activity levels, not resting prior to taking her BP.  Blood Pressure Log Today: 119/88 136/80 128/74 91/68 109/75 102/74 112/73 127/75 126/79 99/71  - Patient reports good  compliance with blood pressure medications. She has been taking her blood pressure medication every morning and evening. Has been taking 1/2 tablet of carvedilol twice daily, and 1 tablet of carvedilol daily.  - Denies medication S-E   - Smoking Status noted   - She denies new onset of: chest pain, exercise intolerance, shortness of breath, dizziness, visual changes, headache, lower extremity swelling or claudication.   Last 3 blood pressure readings in our office are as follows: BP Readings from Last 3 Encounters:  08/21/17 134/85  07/31/17 (!) 182/96  07/12/17 (!) 188/107    Filed Weights   08/21/17 1406  Weight: 223 lb 6.4 oz (101.3 kg)    Last 3 blood pressure readings in our office are as follows: BP Readings from Last 3 Encounters:  08/21/17 134/85  07/31/17 (!) 182/96  07/12/17 (!) 188/107    BMI Readings from Last 3 Encounters:  08/21/17 38.35 kg/m  07/31/17 39.12 kg/m  07/12/17 39.03 kg/m     No problems updated.    Patient Care Team    Relationship Specialty Notifications Start End  Mellody Dance, DO PCP - General Family Medicine  07/12/17   Pyrtle, Lajuan Lines, MD Consulting Physician Gastroenterology  07/13/17   Delrae Rend, MD Consulting Physician Endocrinology  07/13/17   Emily Filbert, MD Consulting Physician Obstetrics and Gynecology  07/13/17      Patient Active Problem List   Diagnosis Date Noted  . Diabetes mellitus due to underlying condition with stage 3 chronic kidney disease, without long-term current use of insulin ([place]) 07/12/2017  . Mixed diabetic hyperlipidemia associated with type 2 diabetes mellitus ([place]) 07/12/2017  . Hypertension associated with diabetes ([place]) 07/12/2017  . Obesity, Class III, morbid obesity  ([place]) 07/12/2017  . H/O medication noncompliance 07/12/2017  . Barrett's esophagus 03/11/2016  . 1st degree AV block 03/01/2016  . Tachycardia 03/01/2016  . Dysphagia 02/10/2016  . Bloating 02/10/2016  . Constipation  02/10/2016  . Vulvar abscess   . Abscess 06/21/2015  . Sepsis ([place]) 06/21/2015  . GERD (gastroesophageal reflux disease) 07/31/2014  . Adenomatous polyp of colon 04/03/2014  . Allergic rhinitis 01/23/2014  . CTS (carpal tunnel syndrome) 10/03/2013  . Uncontrolled type 2 diabetes mellitus with hyperglycemia ([place]) 10/03/2013  . Low back pain 10/03/2013  . Lumbar arthropathy 10/03/2013  . Lumbar degenerative disc disease 10/03/2013  . Neuropathy 10/03/2013  . Osteoarthritis 10/03/2013  . 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 Tenosynovitis of foot 10/03/2013  . Type 2 diabetes mellitus with complication, with long-term current use  of insulin ([place]) 09/12/2013  . Hyperlipidemia 09/12/2013  . Essential hypertension, benign 09/12/2013  . Diabetic peripheral neuropathy associated with type 2 diabetes mellitus ([place]) 09/12/2013     Past Medical History:  Diagnosis Date  . Allergy   . Barrett esophagus   . Cataract    left cataract removal  . Essential hypertension, benign 09/12/2013  . GERD (gastroesophageal reflux disease)   . Hyperlipidemia 09/12/2013  . Type 2 diabetes mellitus ([place]) 09/12/2013   Dr. Posey Pronto at [place]      Past Surgical History:  Procedure Laterality Date  . APPENDECTOMY    . CATARACT EXTRACTION Left   . CESAREAN SECTION  1990  . COLONOSCOPY    . right tube and ovary removed    . WISDOM TOOTH EXTRACTION       Family History  Problem Relation Age of Onset  . Lung cancer Mother   . Heart disease Maternal Aunt   . Colon cancer Neg Hx   . Esophageal cancer Neg Hx   . Rectal cancer Neg Hx   . Stomach cancer Neg Hx   . Pancreatic cancer Neg Hx      Social History   Substance and Sexual Activity  Drug Use No  ,  Social History   Substance and Sexual Activity  Alcohol Use No  . Alcohol/week: 0.0 oz  ,  Social History   Tobacco Use  Smoking Status Former Smoker  . Packs/day: 0.50  Smokeless Tobacco Never Used  Tobacco Comment    quit 30 plus years ago.  ,    Current Outpatient Medications on File Prior to Visit  Medication Sig Dispense Refill  . aspirin 81 MG chewable tablet Chew by mouth daily.    Marland Kitchen atorvastatin (LIPITOR) 40 MG tablet Take 40 mg by mouth daily.  3  . carvedilol (COREG) 6.25 MG tablet Take 1 tablet (6.25 mg total) by mouth 2 (two) times daily with a meal. 60 tablet 1  . Insulin Pen Needle (B-D UF III MINI PEN NEEDLES) 31G X 5 MM MISC Patient to use to inject insulin. 100 each 11  . pantoprazole (PROTONIX) 40 MG tablet Take 40 mg by mouth 2 (two) times daily.    . Semaglutide (OZEMPIC) 1 MG/DOSE SOPN Inject 0.25 mg into the skin every 7 (seven) days for 30 days, THEN 0.5 mg every 7 (seven) days. Needs OV prior to refill. 3 pen 0  . Vitamin D, Ergocalciferol, (DRISDOL) 50000 units CAPS capsule TAKE 1 CAPSULE (50,000 UNITS TOTAL) BY MOUTH EVERY 7 (SEVEN) DAYS. TAKE FOR 8 TOTAL DOSES(WEEKS) (Patient taking differently: Take 50,000 Units by mouth 2 (two) times a week. Take for 8 total doses(weeks)) 8 capsule 0   Current Facility-Administered Medications on File Prior to Visit  Medication Dose Route Frequency Provider Last Rate Last Dose  . 0.9 %  sodium chloride infusion  500 mL Intravenous Once Pyrtle, Lajuan Lines, MD         Allergies  Allergen Reactions  . Penicillins Hives and Shortness Of Breath  . Metformin And Related Diarrhea     Review of Systems:   General:  Denies fever, chills Optho/Auditory:   Denies visual changes, blurred vision Respiratory:   Denies SOB, cough, wheeze, DIB  Cardiovascular:   Denies chest pain, palpitations, painful respirations Gastrointestinal:   Denies nausea, vomiting, diarrhea.  Endocrine:     Denies new hot or cold intolerance Musculoskeletal:  Denies joint swelling, gait issues, or new unexplained myalgias/ arthralgias Skin:  Denies rash, suspicious lesions  Neurological:    Denies dizziness, unexplained weakness, numbness  Psychiatric/Behavioral:   Denies  mood changes    Objective:     Blood pressure 134/85, pulse (!) 118, height 5\' 4"  (1.626 m), weight 223 lb 6.4 oz (101.3 kg), SpO2 98 %.  Body mass index is 38.35 kg/m.  General: Well Developed, well nourished, and in no acute distress.  HEENT: Normocephalic, atraumatic, pupils equal round reactive to light, neck supple, No carotid bruits, no JVD Skin: Warm and dry, cap RF less 2 sec Cardiac: Regular rate and rhythm, S1, S2 WNL's, no murmurs rubs or gallops Respiratory: ECTA B/L, Not using accessory muscles, speaking in full sentences. NeuroM-Sk: Ambulates w/o assistance, moves ext * 4 w/o difficulty, sensation grossly intact.  Ext: scant edema b/l lower ext Psych: No HI/SI, judgement and insight good, Euthymic mood. Full Affect.

## 2017-08-21 NOTE — Patient Instructions (Addendum)
Diabetes Start by increasing your insulin to 30 units every night-starting tonight.  Then every five days, go up by 10 units nightly until I see you next office visit, as your goal fasting blood sugars in the mornings is to be less than 150.  However as we discussed we should do it slowly over time.  -Please make sure you go up on the dose of your Ozempic as previously prescribed.  Blood Pressure Reduce to half tablet of the amlodipine (down to 5 mg daily), and increase carvedilol to 1 full tablet twice daily.  -Into monitor your blood pressure and blood sugars and bring in next office visit the Giardia scheduled in 15 days or so.    Diabetes Mellitus and Standards of Medical Care  Managing diabetes (diabetes mellitus) can be complicated. Your diabetes treatment may be managed by a team of health care providers, including:  A diet and nutrition specialist (registered dietitian).  A nurse.  A certified diabetes educator (CDE).  A diabetes specialist (endocrinologist).  An eye doctor.  A primary care provider.  A dentist.  Your health care providers follow a schedule in order to help you get the best quality of care. The following schedule is a general guideline for your diabetes management plan. Your health care providers may also give you more specific instructions.  HbA1c (hemoglobin A1c) test This test provides information about blood sugar (glucose) control over the previous 2-3 months. It is used to check whether your diabetes management plan needs to be adjusted.  If you are meeting your treatment goals, this test is done at least 2 times a year.  If you are not meeting treatment goals or if your treatment goals have changed, this test is done 4 times a year.  Blood pressure test  This test is done at every routine medical visit. For most people, the goal is less than 130/80. Ask your health care provider what your goal blood pressure should be.  Dental and eye  exams  Visit your dentist two times a year.  If you have type 1 diabetes, get an eye exam 3-5 years after you are diagnosed, and then once a year after your first exam. ? If you were diagnosed with type 1 diabetes as a child, get an eye exam when you are age 74 or older and have had diabetes for 3-5 years. After the first exam, you should get an eye exam once a year.  If you have type 2 diabetes, have an eye exam as soon as you are diagnosed, and then once a year after your first exam.  Foot care exam  Visual foot exams are done at every routine medical visit. The exams check for cuts, bruises, redness, blisters, sores, or other problems with the feet.  A complete foot exam is done by your health care provider once a year. This exam includes an inspection of the structure and skin of your feet, and a check of the pulses and sensation in your feet. ? Type 1 diabetes: Get your first exam 3-5 years after diagnosis. ? Type 2 diabetes: Get your first exam as soon as you are diagnosed.  Check your feet every day for cuts, bruises, redness, blisters, or sores. If you have any of these or other problems that are not healing, contact your health care provider.  Kidney function test (urine microalbumin)  This test is done once a year. ? Type 1 diabetes: Get your first test 5 years after diagnosis. ? Type  2 diabetes: Get your first test as soon as you are diagnosed._  If you have chronic kidney disease (CKD), get a serum creatinine and estimated glomerular filtration rate (eGFR) test once a year.  Lipid profile (cholesterol, HDL, LDL, triglycerides)  This test should be done when you are diagnosed with diabetes, and every 5 years after the first test. If you are on medicines to lower your cholesterol, you may need to get this test done every year. ? The goal for LDL is less than 100 mg/dL (5.5 mmol/L). If you are at high risk, the goal is less than 70 mg/dL (3.9 mmol/L). ? The goal for HDL is 40  mg/dL (2.2 mmol/L) for men and 50 mg/dL(2.8 mmol/L) for women. An HDL cholesterol of 60 mg/dL (3.3 mmol/L) or higher gives some protection against heart disease. ? The goal for triglycerides is less than 150 mg/dL (8.3 mmol/L).  Immunizations  The yearly flu (influenza) vaccine is recommended for everyone 6 months or older who has diabetes.  The pneumonia (pneumococcal) vaccine is recommended for everyone 2 years or older who has diabetes. If you are 46 or older, you may get the pneumonia vaccine as a series of two separate shots.  The hepatitis B vaccine is recommended for adults shortly after they have been diagnosed with diabetes.  The Tdap (tetanus, diphtheria, and pertussis) vaccine should be given: ? According to normal childhood vaccination schedules, for children. ? Every 10 years, for adults who have diabetes.  The shingles vaccine is recommended for people who have had chicken pox and are 50 years or older.  Mental and emotional health  Screening for symptoms of eating disorders, anxiety, and depression is recommended at the time of diagnosis and afterward as needed. If your screening shows that you have symptoms (you have a positive screening result), you may need further evaluation and be referred to a mental health care provider.  Diabetes self-management education  Education about how to manage your diabetes is recommended at diagnosis and ongoing as needed.  Treatment plan  Your treatment plan will be reviewed at every medical visit.  Summary  Managing diabetes (diabetes mellitus) can be complicated. Your diabetes treatment may be managed by a team of health care providers.  Your health care providers follow a schedule in order to help you get the best quality of care.  Standards of care including having regular physical exams, blood tests, blood pressure monitoring, immunizations, screening tests, and education about how to manage your diabetes.  Your health care  providers may also give you more specific instructions based on your individual health.      Type 2 Diabetes Mellitus, Self Care, Adult Caring for yourself after you have been diagnosed with type 2 diabetes (type 2 diabetes mellitus) means keeping your blood sugar (glucose) under control with a balance of:  Nutrition.  Exercise.  Lifestyle changes.  Medicines or insulin, if necessary.  Support from your team of health care providers and others.  The following information explains what you need to know to manage your diabetes at home. What do I need to do to manage my blood glucose?  Check your blood glucose every day, as often as told by your health care provider.  Contact your health care provider if your blood glucose is above your target for 2 tests in a row.  Have your A1c (hemoglobin A1c) level checked at least two times a year, or as often as told by your health care provider. Your health care  provider will set individualized treatment goals for you. Generally, the goal of treatment is to maintain the following blood glucose levels:  Before meals (preprandial): 80-130 mg/dL (4.4-7.2 mmol/L).  After meals (postprandial): below 180 mg/dL (10 mmol/L).  A1c level: less than 7%.  What do I need to know about hyperglycemia and hypoglycemia? What is hyperglycemia? Hyperglycemia, also called high blood glucose, occurs when blood glucose is too high.Make sure you know the early signs of hyperglycemia, such as:  Increased thirst.  Hunger.  Feeling very tired.  Needing to urinate more often than usual.  Blurry vision.  What is hypoglycemia? Hypoglycemia, also called low blood glucose, occurswith a blood glucose level at or below 70 mg/dL (3.9 mmol/L). The risk for hypoglycemia increases during or after exercise, during sleep, during illness, and when skipping meals or not eating for a long time (fasting). It is important to know the symptoms of hypoglycemia and treat  it right away. Always have a 15-gram rapid-acting carbohydrate snack with you to treat low blood glucose. Family members and close friends should also know the symptoms and should understand how to treat hypoglycemia, in case you are not able to treat yourself. What are the symptoms of hypoglycemia? Hypoglycemia symptoms can include:  Hunger.  Anxiety.  Sweating and feeling clammy.  Confusion.  Dizziness or feeling light-headed.  Sleepiness.  Nausea.  Increased heart rate.  Headache.  Blurry vision.  Seizure.  Nightmares.  Tingling or numbness around the mouth, lips, or tongue.  A change in speech.  Decreased ability to concentrate.  A change in coordination.  Restless sleep.  Tremors or shakes.  Fainting.  Irritability.  How do I treat hypoglycemia?  If you are alert and able to swallow safely, follow the 15:15 rule:  Take 15 grams of a rapid-acting carbohydrate. Rapid-acting options include: ? 1 tube of glucose gel. ? 3 glucose pills. ? 6-8 pieces of hard candy. ? 4 oz (120 mL) of fruit juice. ? 4 oz (120 mL) of regular (not diet) soda.  Check your blood glucose 15 minutes after you take the carbohydrate.  If the repeat blood glucose level is still at or below 70 mg/dL (3.9 mmol/L), take 15 grams of a carbohydrate again.  If your blood glucose level does not increase above 70 mg/dL (3.9 mmol/L) after 3 tries, seek emergency medical care.  After your blood glucose level returns to normal, eat a meal or a snack within 1 hour.  How do I treat severe hypoglycemia? Severe hypoglycemia is when your blood glucose level is at or below 54 mg/dL (3 mmol/L). Severe hypoglycemia is an emergency. Do not wait to see if the symptoms will go away. Get medical help right away. Call your local emergency services (911 in the [place] Do not drive yourself to the hospital. If you have severe hypoglycemia and you cannot eat or drink, you may need an injection of glucagon.  A family member or close friend should learn how to check your blood glucose and how to give you a glucagon injection. Ask your health care provider if you need to have an emergency glucagon injection kit available. Severe hypoglycemia may need to be treated in a hospital. The treatment may include getting glucose through an IV tube. You may also need treatment for the cause of your hypoglycemia. Can having diabetes put me at risk for other conditions? Having diabetes can put you at risk for other long-term (chronic) conditions, such as heart disease and kidney disease. Your health  care provider may prescribe medicines to help prevent complications from diabetes. These medicines may include:  Aspirin.  Medicine to lower cholesterol.  Medicine to control blood pressure.  What else can I do to manage my diabetes? Take your diabetes medicines as told  If your health care provider prescribed insulin or diabetes medicines, take them every day.  Do not run out of insulin or other diabetes medicines that you take. Plan ahead so you always have these available.  If you use insulin, adjust your dosage based on how physically active you are and what foods you eat. Your health care provider will tell you how to adjust your dosage. Make healthy food choices  The things that you eat and drink affect your blood glucose and your insulin dosage. Making good choices helps to control your diabetes and prevent other health problems. A healthy meal plan includes eating lean proteins, complex carbohydrates, fresh fruits and vegetables, low-fat dairy products, and healthy fats. Make an appointment to see a diet and nutrition specialist (registered dietitian) to help you create an eating plan that is right for you. Make sure that you:  Follow instructions from your health care provider about eating or drinking restrictions.  Drink enough fluid to keep your urine clear or pale yellow.  Eat healthy snacks between  nutritious meals.  Track the carbohydrates that you eat. Do this by reading food labels and learning the standard serving sizes of foods.  Follow your sick day plan whenever you cannot eat or drink as usual. Make this plan in advance with your health care provider.  Stay active  Exercise regularly, as told by your health care provider. This may include:  Stretching and doing strength exercises, such as yoga or weightlifting, at least 2 times a week.  Doing at least 150 minutes of moderate-intensity or vigorous-intensity exercise each week. This could be brisk walking, biking, or water aerobics. ? Spread out your activity over at least 3 days of the week. ? Do not go more than 2 days in a row without doing some kind of physical activity.  When you start a new exercise or activity, work with your health care provider to adjust your insulin, medicines, or food intake as needed. Make healthy lifestyle choices  Do not use any tobacco products, such as cigarettes, chewing tobacco, and e-cigarettes. If you need help quitting, ask your health care provider.  If your health care provider says that alcohol is safe for you, limit alcohol intake to no more than 1 drink per day for nonpregnant women and 2 drinks per day for men. One drink equals 12 oz of beer, 5 oz of wine, or 1 oz of hard liquor.  Learn to manage stress. If you need help with this, ask your health care provider. Care for your body   Keep your immunizations up to date. In addition to getting vaccinations as told by your health care provider, it is recommended that you get vaccinated against the following illnesses: ? The flu (influenza). Get a flu shot every year. ? Pneumonia. ? Hepatitis B.  Schedule an eye exam soon after your diagnosis, and then one time every year after that.  Check your skin and feet every day for cuts, bruises, redness, blisters, or sores. Schedule a foot exam with your health care provider once every  year.  Brush your teeth and gums two times a day, and floss at least one time a day. Visit your dentist at least once every 6 months.  Maintain a healthy weight. General instructions  Take over-the-counter and prescription medicines only as told by your health care provider.  Share your diabetes management plan with people in your workplace, school, and household.  Check your urine for ketones when you are ill and as told by your health care provider.  Ask your health care provider: ? Do I need to meet with a diabetes educator? ? Where can I find a support group for people with diabetes?  Carry a medical alert card or wear medical alert jewelry.  Keep all follow-up visits as told by your health care provider. This is important. Where to find more information: For more information about diabetes, visit:  American Diabetes Association (ADA): www.diabetes.org  American Association of Diabetes Educators (AADE): www.diabeteseducator.org/patient-resources  This information is not intended to replace advice given to you by your health care provider. Make sure you discuss any questions you have with your health care provider. Document Released: 06/29/2015 Document Revised: 08/13/2015 Document Reviewed: 04/10/2015 Elsevier Interactive Patient Education  2017 [place].      Blood Glucose Monitoring, Adult Monitoring your blood sugar (glucose) helps you manage your diabetes. It also helps you and your health care provider determine how well your diabetes management plan is working. Blood glucose monitoring involves checking your blood glucose as often as directed, and keeping a record (log) of your results over time. Why should I monitor my blood glucose? Checking your blood glucose regularly can:  Help you understand how food, exercise, illnesses, and medicines affect your blood glucose.  Let you know what your blood glucose is at any time. You can quickly tell if you are having  low blood glucose (hypoglycemia) or high blood glucose (hyperglycemia).  Help you and your health care provider adjust your medicines as needed.  When should I check my blood glucose? Follow instructions from your health care provider about how often to check your blood glucose.   This may depend on:  The type of diabetes you have.  How well-controlled your diabetes is.  Medicines you are taking.  If you have type 1 diabetes:  Check your blood glucose at least 2 times a day.  Also check your blood glucose: ? Before every insulin injection. ? Before and after exercise. ? Between meals. ? 2 hours after a meal. ? Occasionally between 2:00 a.m. and 3:00 a.m., as directed. ? Before potentially dangerous tasks, like driving or using heavy machinery. ? At bedtime.  You may need to check your blood glucose more often, up to 6-10 times a day: ? If you use an insulin pump. ? If you need multiple daily injections (MDI). ? If your diabetes is not well-controlled. ? If you are ill. ? If you have a history of severe hypoglycemia. ? If you have a history of not knowing when your blood glucose is getting low (hypoglycemia unawareness).  If you have type 2 diabetes:  If you take insulin or other diabetes medicines, check your blood glucose at least 2 times a day.  If you are on intensive insulin therapy, check your blood glucose at least 4 times a day. Occasionally, you may also need to check between 2:00 a.m. and 3:00 a.m., as directed.  Also check your blood glucose: ? Before and after exercise. ? Before potentially dangerous tasks, like driving or using heavy machinery.  You may need to check your blood glucose more often if: ? Your medicine is being adjusted. ? Your diabetes is not well-controlled. ? You are  ill.  What is a blood glucose log?  A blood glucose log is a record of your blood glucose readings. It helps you and your health care provider: ? Look for patterns in your  blood glucose over time. ? Adjust your diabetes management plan as needed.  Every time you check your blood glucose, write down your result and notes about things that may be affecting your blood glucose, such as your diet and exercise for the day.  Most glucose meters store a record of glucose readings in the meter. Some meters allow you to download your records to a computer. How do I check my blood glucose? Follow these steps to get accurate readings of your blood glucose: Supplies needed   Blood glucose meter.  Test strips for your meter. Each meter has its own strips. You must use the strips that come with your meter.  A needle to prick your finger (lancet). Do not use lancets more than once.  A device that holds the lancet (lancing device).  A journal or log book to write down your results.  Procedure  Wash your hands with soap and water.  Prick the side of your finger (not the tip) with the lancet. Use a different finger each time.  Gently rub the finger until a small drop of blood appears.  Follow instructions that come with your meter for inserting the test strip, applying blood to the strip, and using your blood glucose meter.  Write down your result and any notes.  Alternative testing sites  Some meters allow you to use areas of your body other than your finger (alternative sites) to test your blood.  If you think you may have hypoglycemia, or if you have hypoglycemia unawareness, do not use alternative sites. Use your finger instead.  Alternative sites may not be as accurate as the fingers, because blood flow is slower in these areas. This means that the result you get may be delayed, and it may be different from the result that you would get from your finger.  The most common alternative sites are: ? Forearm. ? Thigh. ? Palm of the hand.  Additional tips  Always keep your supplies with you.  If you have questions or need help, all blood glucose meters  have a 24-hour hotline number that you can call. You may also contact your health care provider.  After you use a few boxes of test strips, adjust (calibrate) your blood glucose meter by following instructions that came with your meter.    The American Diabetes Association suggests the following targets for most nonpregnant adults with diabetes.  More or less stringent glycemic goals may be appropriate for each individual.  A1C: Less than 7% A1C may also be reported as eAG: Less than 154 mg/dl Before a meal (preprandial plasma glucose): 80-130 mg/dl 1-2 hours after beginning of the meal (Postprandial plasma glucose)*: Less than 180 mg/dl  *Postprandial glucose may be targeted if A1C goals are not met despite reaching preprandial glucose goals.   GOALS in short:  The goals are for the Hgb A1C to be less than 7.0 & blood pressure to be less than 130/80.    It is recommended that all diabetics are educated on and follow a healthy diabetic diet, exercise for 30 minutes 3-4 times per week (walking, biking, swimming, or machine), monitor blood glucose readings and bring that record with you to be reviewed at your next office visit.     You should be checking fasting  blood sugars- especially after you eat poorly or eat really healthy, and also check 2 hour postprandial blood sugars after largest meal of the day.    Write these down and bring in your log at each office visit.    You will need to be seen every 3 months by the provider managing your Diabetes unless told otherwise by that provider.   You will need yearly eye exams from an eye specialist and foot exams to check the nerves of your feet.  Also, your urine should be checked yearly as well to make sure excess protein is not present.   If you are checking your blood pressure at home, please record it and bring it to your next office visit.    Follow the Dietary Approaches to Stop Hypertension (DASH) diet (3 servings of fruit and  vegetables daily, whole grains, low sodium, low-fat proteins).  See below.    Lastly, when it comes to your cholesterol, the goal is to have the HDL (good cholesterol) >40, and the LDL (bad cholesterol) <100.   It is recommended that you follow a heart healthy, low saturated and trans-fat diet and exercise for 30 minutes at least 5 times a week.     (( Check out the DASH diet = 1.5 Gram Low Sodium Diet   A 1.5 gram sodium diet restricts the amount of sodium in the diet to no more than 1.5 g or 1500 mg daily.  The American Heart Association recommends Americans over the age of 43 to consume no more than 1500 mg of sodium each day to reduce the risk of developing high blood pressure.  Research also shows that limiting sodium may reduce heart attack and stroke risk.  Many foods contain sodium for flavor and sometimes as a preservative.  When the amount of sodium in a diet needs to be low, it is important to know what to look for when choosing foods and drinks.  The following includes some information and guidelines to help make it easier for you to adapt to a low sodium diet.    QUICK TIPS  Do not add salt to food.  Avoid convenience items and fast food.  Choose unsalted snack foods.  Buy lower sodium products, often labeled as "lower sodium" or "no salt added."  Check food labels to learn how much sodium is in 1 serving.  When eating at a restaurant, ask that your food be prepared with less salt or none, if possible.    READING FOOD LABELS FOR SODIUM INFORMATION  The nutrition facts label is a good place to find how much sodium is in foods. Look for products with no more than 400 mg of sodium per serving.  Remember that 1.5 g = 1500 mg.  The food label may also list foods as:  Sodium-free: Less than 5 mg in a serving.  Very low sodium: 35 mg or less in a serving.  Low-sodium: 140 mg or less in a serving.  Light in sodium: 50% less sodium in a serving. For example, if a food that usually has  300 mg of sodium is changed to become light in sodium, it will have 150 mg of sodium.  Reduced sodium: 25% less sodium in a serving. For example, if a food that usually has 400 mg of sodium is changed to reduced sodium, it will have 300 mg of sodium.    CHOOSING FOODS  Grains  Avoid: Salted crackers and snack items. Some cereals, including instant hot  cereals. Bread stuffing and biscuit mixes. Seasoned rice or pasta mixes.  Choose: Unsalted snack items. Low-sodium cereals, oats, puffed wheat and rice, shredded wheat. English muffins and bread. Pasta.  Meats  Avoid: Salted, canned, smoked, spiced, pickled meats, including fish and poultry. Bacon, ham, sausage, cold cuts, hot dogs, anchovies.  Choose: Low-sodium canned tuna and salmon. Fresh or frozen meat, poultry, and fish.  Dairy  Avoid: Processed cheese and spreads. Cottage cheese. Buttermilk and condensed milk. Regular cheese.  Choose: Milk. Low-sodium cottage cheese. Yogurt. Sour cream. Low-sodium cheese.  Fruits and Vegetables  Avoid: Regular canned vegetables. Regular canned tomato sauce and paste. Frozen vegetables in sauces. Olives. Angie Fava. Relishes. Sauerkraut.  Choose: Low-sodium canned vegetables. Low-sodium tomato sauce and paste. Frozen or fresh vegetables. Fresh and frozen fruit.  Condiments  Avoid: Canned and packaged gravies. Worcestershire sauce. Tartar sauce. Barbecue sauce. Soy sauce. Steak sauce. Ketchup. Onion, garlic, and table salt. Meat flavorings and tenderizers.  Choose: Fresh and dried herbs and spices. Low-sodium varieties of mustard and ketchup. Lemon juice. Tabasco sauce. Horseradish.    SAMPLE 1.5 GRAM SODIUM MEAL PLAN:   Breakfast / Sodium (mg)  1 cup low-fat milk / 143 mg  1 whole-wheat English muffin / 240 mg  1 tbs heart-healthy margarine / 153 mg  1 hard-boiled egg / 139 mg  1 small orange / 0 mg  Lunch / Sodium (mg)  1 cup raw carrots / 76 mg  2 tbs no salt added peanut butter / 5 mg  2 slices  whole-wheat bread / 270 mg  1 tbs jelly / 6 mg   cup red grapes / 2 mg  Dinner / Sodium (mg)  1 cup whole-wheat pasta / 2 mg  1 cup low-sodium tomato sauce / 73 mg  3 oz lean ground beef / 57 mg  1 small side salad (1 cup raw spinach leaves,  cup cucumber,  cup yellow bell pepper) with 1 tsp olive oil and 1 tsp red wine vinegar / 25 mg  Snack / Sodium (mg)  1 container low-fat vanilla yogurt / 107 mg  3 graham cracker squares / 127 mg  Nutrient Analysis  Calories: 1745  Protein: 75 g  Carbohydrate: 237 g  Fat: 57 g  Sodium: 1425 mg  Document Released: 03/07/2005 Document Revised: 11/17/2010 Document Reviewed: 06/08/2009  ExitCare Patient Information 2012 [employer].))    This information is not intended to replace advice given to you by your health care provider. Make sure you discuss any questions you have with your health care provider. Document Released: 03/10/2003 Document Revised: 09/25/2015 Document Reviewed: 08/17/2015 Elsevier Interactive Patient Education  2017 [employer].

## 2017-09-05 ENCOUNTER — Ambulatory Visit (INDEPENDENT_AMBULATORY_CARE_PROVIDER_SITE_OTHER): Payer: BLUE CROSS/BLUE SHIELD | Admitting: Family Medicine

## 2017-09-05 ENCOUNTER — Encounter: Payer: Self-pay | Admitting: Family Medicine

## 2017-09-05 VITALS — BP 122/82 | HR 118 | Ht 64.0 in | Wt 230.9 lb

## 2017-09-05 DIAGNOSIS — Z9114 Patient's other noncompliance with medication regimen: Secondary | ICD-10-CM | POA: Diagnosis not present

## 2017-09-05 DIAGNOSIS — Z794 Long term (current) use of insulin: Secondary | ICD-10-CM | POA: Diagnosis not present

## 2017-09-05 DIAGNOSIS — L309 Dermatitis, unspecified: Secondary | ICD-10-CM | POA: Diagnosis not present

## 2017-09-05 DIAGNOSIS — L299 Pruritus, unspecified: Secondary | ICD-10-CM | POA: Diagnosis not present

## 2017-09-05 DIAGNOSIS — E118 Type 2 diabetes mellitus with unspecified complications: Secondary | ICD-10-CM | POA: Diagnosis not present

## 2017-09-05 DIAGNOSIS — E1159 Type 2 diabetes mellitus with other circulatory complications: Secondary | ICD-10-CM

## 2017-09-05 DIAGNOSIS — I10 Essential (primary) hypertension: Secondary | ICD-10-CM | POA: Diagnosis not present

## 2017-09-05 LAB — POCT UA - MICROALBUMIN
Albumin/Creatinine Ratio, Urine, POC: <30
Creatinine, POC: 100 mg/dL
MICROALBUMIN (UR) POC: 10 mg/L

## 2017-09-05 MED ORDER — TRIAMCINOLONE ACETONIDE 0.1 % EX CREA: 1.0000 "application " | TOPICAL_CREAM | Freq: Two times a day (BID) | CUTANEOUS | 0 refills | Status: DC

## 2017-09-05 NOTE — Progress Notes (Signed)
Impression and Recommendations:    1. Type 2 diabetes mellitus with complication, with long-term current use of insulin ([name])   2. Obesity, Class III, morbid obesity  ([place])   3. Hypertension associated with diabetes ([place])   4. H/O medication noncompliance   5. Itchy skin   6. Dermatitis      1. DM2 -Increase tresiba as directed below.  -Continue seeing your diabetic nutritionist.   -continue your other ozempic and insulin meds as listed below.  -Fup 3 months around 11-21-17 for A1c. -continue diet/exercise. Reduce intake of sweets and carbohydrates. Goal fasting BS: <140   2. Obesity Class III -down 3 lb since last OV 08-21-17.  -continue losing weight. Prudent diet/exercise discussed.  3. Hypertension -BP well controlled at this time. Pt is asymptomatic at this time. Continue meds. -Check BP at home and keep a log. Bring this into next OV.   4. H/o medication noncompliance -take all your meds as listed below.  5. Itchy skin/dermatitis -start triamcinolone.   Education and routine counseling performed. Handouts provided.  Orders Placed This Encounter  Procedures  . POCT UA - Microalbumin    Meds ordered this encounter  Medications  . triamcinolone cream (KENALOG) 0.1 %    Sig: Apply 1 application topically 2 (two) times daily.    Dispense:  453.6 g    Refill:  0    Return for Follow-up around 11/21/2017 for recheck A1c and diabetes check.   The patient was counseled, risk factors were discussed, anticipatory guidance given.  Gross side effects, risk and benefits, and alternatives of medications discussed with patient.  Patient is aware that all medications have potential side effects and we are unable to predict every side effect or drug-drug interaction that may occur.  Expresses verbal understanding and consents to current therapy plan and treatment regimen.  Please see AVS handed out to patient at the end of our visit for further patient instructions/  counseling done pertaining to today's office visit.    Note: This document was prepared using Dragon voice recognition software and may include unintentional dictation errors.  This document serves as a record of services personally performed by Mellody Dance, DO. It was created on her behalf by Mayer Masker, a trained medical scribe. The creation of this record is based on the scribe's personal observations and the provider's statements to them.   I have reviewed the above medical documentation for accuracy and completeness and I concur.  Mellody Dance 09/05/17 1:16 PM    Subjective:    Chief Complaint  Patient presents with  . Follow-up     Julie Prince is a 57 y.o. female who presents to [facility] today for Diabetes Management and HTN.   We increased her insulin to 30 units every night and went up on her ozempic.   We also reduced her amlodipine down to 5 mg qd, and increased carvedilol to 1 full tablet bid.  She is down 3 lb since last OV 08-21-17.    HTN HPI:  -  Her blood pressure has been controlled at home.  Pt is checking it at home.   - Patient reports good compliance with blood pressure medications  - Denies medication S-E   - Smoking Status noted   - She denies new onset of: chest pain, exercise intolerance, shortness of breath, dizziness, visual changes, headache, lower extremity swelling or claudication.   Last 3 blood pressure readings in our office  are as follows: BP Readings from Last 3 Encounters:  09/05/17 122/82  08/21/17 134/85  07/31/17 (!) 182/96    Filed Weights   09/05/17 1017  Weight: 230 lb 14.4 oz (104.7 kg)   BP's: 105/75, pulse 90, 104/72, pulses 80s-90s. 106/83, 128/72, 119/81.   DM HPI: From last OV we started her on treziba and ozempic.   -  She has been working on diet and exercise for diabetes. She reports going to a birthday party and vastly reduced her usual dieting habits- she only ate  one hotdog without the bun and some fruits. She ate one cupcake without the frosting.   Pt is currently maintained on the following medications for diabetes:   see med list today Medication compliance - yes  Home glucose readings range: Fasting: 278, 260, 260,  She is exercising every day for 1 hour at a time. She walks for 30 minutes on the treadmill, then does 30 minutes on the machines.    Denies polyuria/polydipsia. Denies hypo/ hyperglycemia symptoms - She denies new onset of: chest pain, exercise intolerance, shortness of breath, dizziness, visual changes, headache, lower extremity swelling or claudication.   Last diabetic eye exam was  Lab Results  Component Value Date   HMDIABEYEEXA Retinopathy (A) 07/07/2017    Foot exam- UTD  Last A1C in the office was:  Lab Results  Component Value Date   HGBA1C 10.1 (H) 07/21/2017   HGBA1C 9.5 07/12/2017   HGBA1C 10.0 03/01/2016    Lab Results  Component Value Date   MICROALBUR 10 09/05/2017   LDLCALC 72 07/21/2017   CREATININE 0.87 07/21/2017      Last 3 blood pressure readings in our office are as follows: BP Readings from Last 3 Encounters:  09/05/17 122/82  08/21/17 134/85  07/31/17 (!) 182/96    BMI Readings from Last 3 Encounters:  09/05/17 39.63 kg/m  08/21/17 38.35 kg/m  07/31/17 39.12 kg/m     No problems updated.    Patient Care Team    Relationship Specialty Notifications Start End  Mellody Dance, DO PCP - General Family Medicine  07/12/17   Pyrtle, Lajuan Lines, MD Consulting Physician Gastroenterology  07/13/17   Delrae Rend, MD Consulting Physician Endocrinology  07/13/17   Emily Filbert, MD Consulting Physician Obstetrics and Gynecology  07/13/17   Christella Hartigan, RD Dietitian Dietician  09/05/17      Patient Active Problem List   Diagnosis Date Noted  . Diabetes mellitus due to underlying condition with stage 3 chronic kidney disease, without long-term current use of insulin ([place])  07/12/2017  . Mixed diabetic hyperlipidemia associated with type 2 diabetes mellitus ([place]) 07/12/2017  . Hypertension associated with diabetes ([place]) 07/12/2017  . Obesity, Class III, morbid obesity  ([place]) 07/12/2017  . H/O medication noncompliance 07/12/2017  . Barrett's esophagus 03/11/2016  . 1st degree AV block 03/01/2016  . Tachycardia 03/01/2016  . Dysphagia 02/10/2016  . Bloating 02/10/2016  . Constipation 02/10/2016  . Vulvar abscess   . Abscess 06/21/2015  . Sepsis ([place]) 06/21/2015  . GERD (gastroesophageal reflux disease) 07/31/2014  . Adenomatous polyp of colon 04/03/2014  . Allergic rhinitis 01/23/2014  . CTS (carpal tunnel syndrome) 10/03/2013  . Uncontrolled type 2 diabetes mellitus with hyperglycemia ([place]) 10/03/2013  . Low back pain 10/03/2013  . Lumbar arthropathy 10/03/2013  . Lumbar degenerative disc disease 10/03/2013  . Neuropathy 10/03/2013  . Osteoarthritis 10/03/2013  . 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 Tenosynovitis of foot 10/03/2013  . Type  2 diabetes mellitus with complication, with long-term current use of insulin ([place]) 09/12/2013  . Hyperlipidemia 09/12/2013  . Essential hypertension, benign 09/12/2013  . Diabetic peripheral neuropathy associated with type 2 diabetes mellitus ([place]) 09/12/2013     Past Medical History:  Diagnosis Date  . Allergy   . Barrett esophagus   . Cataract    left cataract removal  . Essential hypertension, benign 09/12/2013  . GERD (gastroesophageal reflux disease)   . Hyperlipidemia 09/12/2013  . Type 2 diabetes mellitus ([place]) 09/12/2013   Dr. Posey Pronto at [facility]      Past Surgical History:  Procedure Laterality Date  . APPENDECTOMY    . CATARACT EXTRACTION Left   . CESAREAN SECTION  1990  . COLONOSCOPY    . right tube and ovary removed    . WISDOM TOOTH EXTRACTION       Family History  Problem Relation Age of Onset  . Lung cancer Mother   . Heart disease Maternal Aunt   . Colon cancer Neg  Hx   . Esophageal cancer Neg Hx   . Rectal cancer Neg Hx   . Stomach cancer Neg Hx   . Pancreatic cancer Neg Hx      Social History   Substance and Sexual Activity  Drug Use No  ,  Social History   Substance and Sexual Activity  Alcohol Use No  . Alcohol/week: 0.0 oz  ,  Social History   Tobacco Use  Smoking Status Former Smoker  . Packs/day: 0.50  Smokeless Tobacco Never Used  Tobacco Comment   quit 30 plus years ago.  ,    Current Outpatient Medications on File Prior to Visit  Medication Sig Dispense Refill  . amLODipine (NORVASC) 10 MG tablet Take 0.5 tablets (5 mg total) by mouth daily. 45 tablet 1  . aspirin 81 MG chewable tablet Chew by mouth daily.    Marland Kitchen atorvastatin (LIPITOR) 40 MG tablet Take 40 mg by mouth daily.  3  . carvedilol (COREG) 6.25 MG tablet Take 1 tablet (6.25 mg total) by mouth 2 (two) times daily with a meal. 60 tablet 1  . insulin degludec (TRESIBA FLEXTOUCH) 100 UNIT/ML SOPN FlexTouch Pen Inject 0.5 mLs (50 Units total) into the skin daily at 10 pm. 10 pen 1  . Insulin Pen Needle (B-D UF III MINI PEN NEEDLES) 31G X 5 MM MISC Patient to use to inject insulin. 100 each 11  . pantoprazole (PROTONIX) 40 MG tablet Take 40 mg by mouth 2 (two) times daily.    . Semaglutide (OZEMPIC) 1 MG/DOSE SOPN Inject 0.25 mg into the skin every 7 (seven) days for 30 days, THEN 0.5 mg every 7 (seven) days. Needs OV prior to refill. 3 pen 0  . Vitamin D, Ergocalciferol, (DRISDOL) 50000 units CAPS capsule TAKE 1 CAPSULE (50,000 UNITS TOTAL) BY MOUTH EVERY 7 (SEVEN) DAYS. TAKE FOR 8 TOTAL DOSES(WEEKS) (Patient taking differently: Take 50,000 Units by mouth 2 (two) times a week. Take for 8 total doses(weeks)) 8 capsule 0   Current Facility-Administered Medications on File Prior to Visit  Medication Dose Route Frequency Provider Last Rate Last Dose  . 0.9 %  sodium chloride infusion  500 mL Intravenous Once Pyrtle, Lajuan Lines, MD         Allergies  Allergen Reactions  .  Penicillins Hives and Shortness Of Breath  . Metformin And Related Diarrhea     Review of Systems:   General:  Denies fever, chills Optho/Auditory:  Denies visual changes, blurred vision Respiratory:   Denies SOB, cough, wheeze, DIB  Cardiovascular:   Denies chest pain, palpitations, painful respirations Gastrointestinal:   Denies nausea, vomiting, diarrhea.  Endocrine:     Denies new hot or cold intolerance Musculoskeletal:  Denies joint swelling, gait issues, or new unexplained myalgias/ arthralgias Skin:  Denies rash, suspicious lesions  Neurological:    Denies dizziness, unexplained weakness, numbness  Psychiatric/Behavioral:   Denies mood changes    Objective:     Blood pressure 122/82, pulse (!) 118, height 5\' 4"  (1.626 m), weight 230 lb 14.4 oz (104.7 kg), SpO2 98 %.  Body mass index is 39.63 kg/m.  General: Well Developed, well nourished, and in no acute distress.  HEENT: Normocephalic, atraumatic, pupils equal round reactive to light, neck supple, No carotid bruits, no JVD Skin: Warm and dry, cap RF less 2 sec Cardiac: Regular rate and rhythm, S1, S2 WNL's, no murmurs rubs or gallops Respiratory: ECTA B/L, Not using accessory muscles, speaking in full sentences. NeuroM-Sk: Ambulates w/o assistance, moves ext * 4 w/o difficulty, sensation grossly intact.  Ext: scant edema b/l lower ext Psych: No HI/SI, judgement and insight good, Euthymic mood. Full Affect.

## 2017-09-05 NOTE — Patient Instructions (Addendum)
-Continue to increase Ozempic dose monthly as previously discussed.    Behavior Modification Ideas for Weight Management  Weight management involves adopting a healthy lifestyle that includes a knowledge of nutrition and exercise, a positive attitude and the right kind of motivation. Internal motives such as better health, increased energy, self-esteem and personal control increase your chances of lifelong weight management success.  Remember to have realistic goals and think long-term success. Believe in yourself and you can do it. The following information will give you ideas to help you meet your goals.  Control Your Home Environment  Eat only while sitting down at the kitchen or dining room table. Do not eat while watching television, reading, cooking, talking on the phone, standing at the refrigerator or working on the computer. Keep tempting foods out of the house - don't buy them. Keep tempting foods out of sight. Have low-calorie foods ready to eat. Unless you are preparing a meal, stay out of the kitchen. Have healthy snacks at your disposal, such as small pieces of fruit, vegetables, canned fruit, pretzels, low-fat string cheese and nonfat cottage cheese.  Control Your Work Environment  Do not eat at [employer] or keep tempting snacks at your desk. If you get hungry between meals, plan healthy snacks and bring them with you to work. During your breaks, go for a walk instead of eating. If you work around food, plan in advance the one item you will eat at mealtime. Make it inconvenient to nibble on food by chewing gum, sugarless candy or drinking water or another low-calorie beverage. Do not work through meals. Skipping meals slows down metabolism and may result in overeating at the next meal. If food is available for special occasions, either pick the healthiest item, nibble on low-fat snacks brought from home, don't have anything offered, choose one option and have a small amount,  or have only a beverage.  Control Your Mealtime Environment  Serve your plate of food at the stove or kitchen counter. Do not put the serving dishes on the table. If you do put dishes on the table, remove them immediately when finished eating. Fill half of your plate with vegetables, a quarter with lean protein and a quarter with starch. Use smaller plates, bowls and glasses. A smaller portion will look large when it is in a little dish. Politely refuse second helpings. When fixing your plate, limit portions of food to one scoop/serving or less.   Daily Food Management  Replace eating with another activity that you will not associate with food. Wait 20 minutes before eating something you are craving. Drink a large glass of water or diet soda before eating. Always have a big glass or bottle of water to drink throughout the day. Avoid high-calorie add-ons such as cream with your coffee, butter, mayonnaise and salad dressings.  Shopping: Do not shop when hungry or tired. Shop from a list and avoid buying anything that is not on your list. If you must have tempting foods, buy individual-sized packages and try to find a lower-calorie alternative. Don't taste test in the store. Read food labels. Compare products to help you make the healthiest choices.  Preparation: Chew a piece of gum while cooking meals. Use a quarter teaspoon if you taste test your food. Try to only fix what you are going to eat, leaving yourself no chance for seconds. If you have prepared more food than you need, portion it into individual containers and freeze or refrigerate immediately. Don't snack while  cooking meals.  Eating: Eat slowly. Remember it takes about 20 minutes for your stomach to send a message to your brain that it is full. Don't let fake hunger make you think you need more. The ideal way to eat is to take a bite, put your utensil down, take a sip of water, cut your next bite, take a bit, put your  utensil down and so on. Do not cut your food all at one time. Cut only as needed. Take small bites and chew your food well. Stop eating for a minute or two at least once during a meal or snack. Take breaks to reflect and have conversation.  Cleanup and Leftovers: Label leftovers for a specific meal or snack. Freeze or refrigerate individual portions of leftovers. Do not clean up if you are still hungry.  Eating Out and Social Eating  Do not arrive hungry. Eat something light before the meal. Try to fill up on low-calorie foods, such as vegetables and fruit, and eat smaller portions of the high-calorie foods. Eat foods that you like, but choose small portions. If you want seconds, wait at least 20 minutes after you have eaten to see if you are actually hungry or if your eyes are bigger than your stomach. Limit alcoholic beverages. Try a soda water with a twist of lime. Do not skip other meals in the day to save room for the special event.  At Restaurants: Order  la carte rather than buffet style. Order some vegetables or a salad for an appetizer instead of eating bread. If you order a high-calorie dish, share it with someone. Try an after-dinner mint with your coffee. If you do have dessert, share it with two or more people. Don't overeat because you do not want to waste food. Ask for a doggie bag to take extra food home. Tell the server to put half of your entree in a to go bag before the meal is served to you. Ask for salad dressing, gravy or high-fat sauces on the side. Dip the tip of your fork in the dressing before each bite. If bread is served, ask for only one piece. Try it plain without butter or oil. At [employer] where oil and vinegar is served with bread, use only a small amount of oil and a lot of vinegar for dipping.  At a Friend's House: Offer to bring a dish, appetizer or dessert that is low in calories. Serve yourself small portions or tell the host that you  only want a small amount. Stand or sit away from the snack table. Stay away from the kitchen or stay busy if you are near the food. Limit your alcohol intake.  At [employer] and Cafeterias: Cover most of your plate with lettuce and/or vegetables. Use a salad plate instead of a dinner plate. After eating, clear away your dishes before having coffee or tea.  Entertaining at Home: Explore low-fat, low-cholesterol cookbooks. Use single-serving foods like chicken breasts or hamburger patties. Prepare low-calorie appetizers and desserts.   Holidays: Keep tempting foods out of sight. Decorate the house without using food. Have low-calorie beverages and foods on hand for guests. Allow yourself one planned treat a day. Don't skip meals to save up for the holiday feast. Eat regular, planned meals.   Exercise Well  Make exercise a priority and a planned activity in the day. If possible, walk the entire or part of the distance to work. Get an exercise buddy. Go for a walk with a  colleague during one of your breaks, go to the gym, run or take a walk with a friend, walk in the mall with a shopping companion. Park at the end of the parking lot and walk to the store or office entrance. Always take the stairs all of the way or at least part of the way to your floor. If you have a desk job, walk around the office frequently. Do leg lifts while sitting at your desk. Do something outside on the weekends like going for a hike or a bike ride.   Have a Healthy Attitude  Make health your weight management priority. Be realistic. Have a goal to achieve a healthier you, not necessarily the lowest weight or ideal weight based on calculations or tables. Focus on a healthy eating style, not on dieting. Dieting usually lasts for a short amount of time and rarely produces long-term success. Think long term. You are developing new healthy behaviors to follow next month, in a year and in a decade.    This  information is for educational purposes only and is not intended to replace the advice of your doctor or health care provider. We encourage you to discuss with your doctor any questions or concerns you may have.        Guidelines for Losing Weight   We want weight loss that will last so you should lose 1-2 pounds a week.  THAT IS IT! Please pick THREE things a month to change. Once it is a habit check off the item. Then pick another three items off the list to become habits.  If you are already doing a habit on the list GREAT!  Cross that item off!  Don't drink your calories. Ie, alcohol, soda, fruit juice, and sweet tea.   Drink more water. Drink a glass when you feel hungry or before each meal.   Eat breakfast - Complex carb and protein (likeDannon light and fit yogurt, oatmeal, fruit, eggs, [place] bacon).  Measure your cereal.  Eat no more than one cup a day. (ie Kashi)  Eat an apple a day.  Add a vegetable a day.  Try a new vegetable a month.  Use Pam! Stop using oil or butter to cook.  Don't finish your plate or use smaller plates.  Share your dessert.  Eat sugar free Jello for dessert or frozen grapes.  Don't eat 2-3 hours before bed.  Switch to whole wheat bread, pasta, and brown rice.  Make healthier choices when you eat out. No fries!  Pick baked chicken, NOT fried.  Don't forget to SLOW DOWN when you eat. It is not going anywhere.   Take the stairs.  Park far away in the parking lot  Lift soup cans (or weights) for 10 minutes while watching TV.  Walk at work for 10 minutes during break.  Walk outside 1 time a week with your friend, kids, dog, or significant other.  Start a walking group at church.  Walk the mall as much as you can tolerate.   Keep a food diary.  Weigh yourself daily.  Walk for 15 minutes 3 days per week.  Cook at home more often and eat out less. If life happens and you go back to old habits, it is okay.  Just start over.  You can do it!  If you experience chest pain, get short of breath, or tired during the exercise, please stop immediately and inform your doctor.    Before you even begin to  attack a weight-loss plan, it pays to remember this: You are not fat. You have fat. Losing weight isn't about blame or shame; it's simply another achievement to accomplish. Dieting is like any other skill-you have to buckle down and work at it. As long as you act in a smart, reasonable way, you'll ultimately get where you want to be. Here are some weight loss pearls for you.   1. It's Not a Diet. It's a Lifestyle Thinking of a diet as something you're on and suffering through only for the short term doesn't work. To shed weight and keep it off, you need to make permanent changes to the way you eat. It's OK to indulge occasionally, of course, but if you cut calories temporarily and then revert to your old way of eating, you'll gain back the weight quicker than you can say yo-yo. Use it to lose it. Research shows that one of the best predictors of long-term weight loss is how many pounds you drop in the first month. For that reason, nutritionists often suggest being stricter for the first two weeks of your new eating strategy to build momentum. Cut out added sugar and alcohol and avoid unrefined carbs. After that, figure out how you can reincorporate them in a way that's healthy and maintainable.  2. There's a Right Way to Exercise Working out burns calories and fat and boosts your metabolism by building muscle. But those trying to lose weight are notorious for overestimating the number of calories they burn and underestimating the amount they take in. Unfortunately, your system is biologically programmed to hold on to extra pounds and that means when you start exercising, your body senses the deficit and ramps up its hunger signals. If you're not diligent, you'll eat everything you burn and then some. Use it, to lose it. Cardio gets all  the exercise glory, but strength and interval training are the real heroes. They help you build lean muscle, which in turn increases your metabolism and calorie-burning ability 3. Don't Overreact to Mild Hunger Some people have a hard time losing weight because of hunger anxiety. To them, being hungry is bad-something to be avoided at all costs-so they carry snacks with them and eat when they don't need to. Others eat because they're stressed out or bored. While you never want to get to the point of being ravenous (that's when bingeing is likely to happen), a hunger pang, a craving, or the fact that it's 3:00 p.m. should not send you racing for the vending machine or obsessing about the energy bar in your purse. Ideally, you should put off eating until your stomach is growling and it's difficult to concentrate.  Use it to lose it. When you feel the urge to eat, use the HALT method. Ask yourself, Am I really hungry? Or am I angry or anxious, lonely or bored, or tired? If you're still not certain, try the apple test. If you're truly hungry, an apple should seem delicious; if it doesn't, something else is going on. Or you can try drinking water and making yourself busy, if you are still hungry try a healthy snack.  4. Not All Calories Are Created Equal The mechanics of weight loss are pretty simple: Take in fewer calories than you use for energy. But the kind of food you eat makes all the difference. Processed food that's high in saturated fat and refined starch or sugar can cause inflammation that disrupts the hormone signals that tell your brain you're full.  The result: You eat a lot more.  Use it to lose it. Clean up your diet. Swap in whole, unprocessed foods, including vegetables, lean protein, and healthy fats that will fill you up and give you the biggest nutritional bang for your calorie buck. In a few weeks, as your brain starts receiving regular hunger and fullness signals once again, you'll notice that  you feel less hungry overall and naturally start cutting back on the amount you eat.  5. Protein, Produce, and Plant-Based Fats Are Your Weight-Loss Trinity Here's why eating the three Ps regularly will help you drop pounds. Protein fills you up. You need it to build lean muscle, which keeps your metabolism humming so that you can torch more fat. People in a weight-loss program who ate double the recommended daily allowance for protein (about 110 grams for a 150-pound woman) lost 70 percent of their weight from fat, while people who ate the RDA lost only about 40 percent, one study found. Produce is packed with filling fiber. "It's very difficult to consume too many calories if you're eating a lot of vegetables. Example: Three cups of broccoli is a lot of food, yet only 93 calories. (Fruit is another story. It can be easy to overeat and can contain a lot of calories from sugar, so be sure to monitor your intake.) Plant-based fats like olive oil and those in avocados and nuts are healthy and extra satiating.  Use it to lose it. Aim to incorporate each of the three Ps into every meal and snack. People who eat protein throughout the day are able to keep weight off, according to a study in the [place] of Clinical Nutrition. In addition to meat, poultry and seafood, good sources are beans, lentils, eggs, tofu, and yogurt. As for fat, keep portion sizes in check by measuring out salad dressing, oil, and nut butters (shoot for one to two tablespoons). Finally, eat veggies or a little fruit at every meal. People who did that consumed 308 fewer calories but didn't feel any hungrier than when they didn't eat more produce.  7. How You Eat Is As Important As What You Eat In order for your brain to register that you're full, you need to focus on what you're eating. Sit down whenever you eat, preferably at a table. Turn off the TV or computer, put down your phone, and look at your food. Smell it. Chew slowly,  and don't put another bite on your fork until you swallow. When women ate lunch this attentively, they consumed 30 percent less when snacking later than those who listened to an audiobook at lunchtime, according to a study in the [place] of Nutrition. 8. Weighing Yourself Really Works The scale provides the best evidence about whether your efforts are paying off. Seeing the numbers tick up or down or stagnate is motivation to keep going-or to rethink your approach. A 2015 study at [facility] found that daily weigh-ins helped people lose more weight, keep it off, and maintain that loss, even after two years. Use it to lose it. Step on the scale at the same time every day for the best results. If your weight shoots up several pounds from one weigh-in to the next, don't freak out. Eating a lot of salt the night before or having your period is the likely culprit. The number should return to normal in a day or two. It's a steady climb that you need to do something about. 9. Too Much Stress  and Too Little Sleep Are Your Enemies When you're tired and frazzled, your body cranks up the production of cortisol, the stress hormone that can cause carb cravings. Not getting enough sleep also boosts your levels of ghrelin, a hormone associated with hunger, while suppressing leptin, a hormone that signals fullness and satiety. People on a diet who slept only five and a half hours a night for two weeks lost 55 percent less fat and were hungrier than those who slept eight and a half hours, according to a study in the [place]. Use it to lose it. Prioritize sleep, aiming for seven hours or more a night, which research shows helps lower stress. And make sure you're getting quality zzz's. If a snoring spouse or a fidgety cat wakes you up frequently throughout the night, you may end up getting the equivalent of just four hours of sleep, according to a study from [facility]. Keep  pets out of the bedroom, and use a white-noise app to drown out snoring. 10. You Will Hit a plateau-And You Can Bust Through It As you slim down, your body releases much less leptin, the fullness hormone.  If you're not strength training, start right now. Building muscle can raise your metabolism to help you overcome a plateau. To keep your body challenged and burning calories, incorporate new moves and more intense intervals into your workouts or add another sweat session to your weekly routine. Alternatively, cut an extra 100 calories or so a day from your diet. Now that you've lost weight, your body simply doesn't need as much fuel.    Since food equals calories, in order to lose weight you must either eat fewer calories, exercise more to burn off calories with activity, or both. Food that is not used to fuel the body is stored as fat. A major component of losing weight is to make smarter food choices. Here's how:  1)   Limit non-nutritious foods, such as: Sugar, honey, syrups and candy Pastries, donuts, pies, cakes and cookies Soft drinks, sweetened juices and alcoholic beverages  2)  Cut down on high-fat foods by: - Choosing poultry, fish or lean red meat - Choosing low-fat cooking methods, such as baking, broiling, steaming, grilling and boiling - Using low-fat or non-fat dairy products - Using vinaigrette, herbs, lemon or fat-free salad dressings - Avoiding fatty meats, such as bacon, sausage, franks, ribs and luncheon meats - Avoiding high-fat snacks like nuts, chips and chocolate - Avoiding fried foods - Using less butter, margarine, oil and mayonnaise - Avoiding high-fat gravies, cream sauces and cream-based soups  3) Eat a variety of foods, including: - Fruit and vegetables that are raw, steamed or baked - Whole grains, breads, cereal, rice and pasta - Dairy products, such as low-fat or non-fat milk or yogurt, low-fat cottage cheese and low-fat cheese - Protein-rich foods like  chicken, [place], fish, lean meat and legumes, or beans  4) Change your eating habits by: - Eat three balanced meals a day to help control your hunger - Watch portion sizes and eat small servings of a variety of foods - Choose low-calorie snacks - Eat only when you are hungry and stop when you are satisfied - Eat slowly and try not to perform other tasks while eating - Find other activities to distract you from food, such as walking, taking up a hobby or being involved in the community - Include regular exercise in your daily routine ( minimum of 20 min of  moderate-intensity exercise at least 5 days/week)  - Find a support group, if necessary, for emotional support in your weight loss journey           Easy ways to cut 100 calories   1. Eat your eggs with hot sauce OR salsa instead of cheese.  Eggs are great for breakfast, but many people consider eggs and cheese to be BFFs. Instead of cheese-1 oz. of cheddar has 114 calories-top your eggs with hot sauce, which contains no calories and helps with satiety and metabolism. Salsa is also a great option!!  2. Top your toast, waffles or pancakes with fresh berries instead of jelly or syrup. Half a cup of berries-fresh, frozen or thawed-has about 40 calories, compared with 2 tbsp. of maple syrup or jelly, which both have about 100 calories. The berries will also give you a good punch of fiber, which helps keep you full and satisfied and won't spike blood sugar quickly like the jelly or syrup. 3. Swap the non-fat latte for black coffee with a splash of half-and-half. Contrary to its name, that non-fat latte has 130 calories and a startling 19g of carbohydrates per 16 oz. serving. Replacing that 'light' drinkable dessert with a black coffee with a splash of half-and-half saves you more than 100 calories per 16 oz. serving. 4. Sprinkle salads with freeze-dried raspberries instead of dried cranberries. If you want a sweet addition to your  nutritious salad, stay away from dried cranberries. They have a whopping 130 calories per  cup and 30g carbohydrates. Instead, sprinkle freeze-dried raspberries guilt-free and save more than 100 calories per  cup serving, adding 3g of belly-filling fiber. 5. Go for mustard in place of mayo on your sandwich. Mustard can add really nice flavor to any sandwich, and there are tons of varieties, from spicy to honey. A serving of mayo is 95 calories, versus 10 calories in a serving of mustard.  Or try an avocado mayo spread: You can find the recipe few click this link: https://www.californiaavocado.com/recipes/recipe-container/[place]-avocado-mayo 6. Choose a DIY salad dressing instead of the store-bought kind. Mix Dijon or whole grain mustard with low-fat Kefir or red wine vinegar and garlic. 7. Use hummus as a spread instead of a dip. Use hummus as a spread on a high-fiber cracker or tortilla with a sandwich and save on calories without sacrificing taste. 8. Pick just one salad "accessory." Salad isn't automatically a calorie winner. It's easy to over-accessorize with toppings. Instead of topping your salad with nuts, avocado and cranberries (all three will clock in at 313 calories), just pick one. The next day, choose a different accessory, which will also keep your salad interesting. You don't wear all your jewelry every day, right? 9. Ditch the white pasta in favor of spaghetti squash. One cup of cooked spaghetti squash has about 40 calories, compared with traditional spaghetti, which comes with more than 200. Spaghetti squash is also nutrient-dense. It's a good source of fiber and Vitamins A and C, and it can be eaten just like you would eat pasta-with a great tomato sauce and [place] meatballs or with pesto, tofu and spinach, for example. 10. Dress up your chili, soups and stews with non-fat [place] yogurt instead of sour cream. Just a 'dollop' of sour cream can set you back 115 calories and a  whopping 12g of fat-seven of which are of the artery-clogging variety. Added bonus: [place] yogurt is packed with muscle-building protein, calcium and B Vitamins. 11. Mash cauliflower instead of mashed potatoes. One  cup of traditional mashed potatoes-in all their creamy goodness-has more than 200 calories, compared to mashed cauliflower, which you can typically eat for less than 100 calories per 1 cup serving. Cauliflower is a great source of the antioxidant indole-3-carbinol (I3C), which may help reduce the risk of some cancers, like breast cancer. 12. Ditch the ice cream sundae in favor of a [place] yogurt parfait. Instead of a cup of ice cream or fro-yo for dessert, try 1 cup of nonfat Greek yogurt topped with fresh berries and a sprinkle of cacao nibs. Both toppings are packed with antioxidants, which can help reduce cellular inflammation and oxidative damage. And the comparison is a no-brainer: One cup of ice cream has about 275 calories; one cup of frozen yogurt has about 230; and a cup of Greek yogurt has just 130, plus twice the protein, so you're less likely to return to the freezer for a second helping. 13. Put olive oil in a spray container instead of using it directly from the bottle. Each tablespoon of olive oil is 120 calories and 15g of fat. Use a mister instead of pouring it straight into the pan or onto a salad. This allows for portion control and will save you more than 100 calories. 14. When baking, substitute canned pumpkin for butter or oil. Canned pumpkin-not pumpkin pie mix-is loaded with Vitamin A, which is important for skin and eye health, as well as immunity. And the comparisons are pretty crazy:  cup of canned pumpkin has about 40 calories, compared to butter or oil, which has more than 800 calories. Yes, 800 calories. Applesauce and mashed banana can also serve as good substitutions for butter or oil, usually in a 1:1 ratio. 15. Top casseroles with high-fiber cereal instead of  breadcrumbs. Breadcrumbs are typically made with white bread, while breakfast cereals contain 5-9g of fiber per serving. Not only will you save more than 150 calories per  cup serving, the swap will also keep you more full and you'll get a metabolism boost from the added fiber. 16. Snack on pistachios instead of macadamia nuts. Believe it or not, you get the same amount of calories from 35 pistachios (100 calories) as you would from only five macadamia nuts. 17. Chow down on kale chips rather than potato chips. This is my favorite 'don't knock it 'till you try it' swap. Kale chips are so easy to make at home, and you can spice them up with a little grated parmesan or chili powder. Plus, they're a mere fraction of the calories of potato chips, but with the same crunch factor we crave so often. 18. Add seltzer and some fruit slices to your cocktail instead of soda or fruit juice. One cup of soda or fruit juice can pack on as much as 140 calories. Instead, use seltzer and fruit slices. The fruit provides valuable phytochemicals, such as flavonoids and anthocyanins, which help to combat cancer and stave off the aging process.            GOALS :  The goals are for the Hgb A1C to be less than 7.0 & blood pressure to be less than 130/80.    It is recommended that all diabetics are educated on and follow a healthy diabetic diet, exercise for 30 minutes 3-4 times per week (walking, biking, swimming, or machine), monitor blood glucose readings and bring that record with you to be reviewed at your next office visit.     You should be checking fasting blood sugars-  especially after you eat poorly or eat really healthy, and also check 2 hour postprandial blood sugars after largest meal of the day.    Write these down and bring in your log at each office visit.    You will need to be seen every 3 months by the provider managing your Diabetes unless told otherwise by that provider.   You will need  yearly eye exams from an eye specialist and foot exams to check the nerves of your feet.  Also, your urine should be checked yearly as well to make sure excess protein is not present.   If you are checking your blood pressure at home, please record it and bring it to your next office visit.    Follow the Dietary Approaches to Stop Hypertension (DASH) diet (3 servings of fruit and vegetables daily, whole grains, low sodium, low-fat proteins).  See below.    Lastly, when it comes to your cholesterol, the goal is to have the HDL (good cholesterol) >40, and the LDL (bad cholesterol) <100.   It is recommended that you follow a heart healthy, low saturated and trans-fat diet and exercise for 30 minutes at least 5 times a week.     (( Check out the DASH diet = 1.5 Gram Low Sodium Diet   A 1.5 gram sodium diet restricts the amount of sodium in the diet to no more than 1.5 g or 1500 mg daily.  The American Heart Association recommends Americans over the age of 14 to consume no more than 1500 mg of sodium each day to reduce the risk of developing high blood pressure.  Research also shows that limiting sodium may reduce heart attack and stroke risk.  Many foods contain sodium for flavor and sometimes as a preservative.  When the amount of sodium in a diet needs to be low, it is important to know what to look for when choosing foods and drinks.  The following includes some information and guidelines to help make it easier for you to adapt to a low sodium diet.    QUICK TIPS  Do not add salt to food.  Avoid convenience items and fast food.  Choose unsalted snack foods.  Buy lower sodium products, often labeled as "lower sodium" or "no salt added."  Check food labels to learn how much sodium is in 1 serving.  When eating at a restaurant, ask that your food be prepared with less salt or none, if possible.    READING FOOD LABELS FOR SODIUM INFORMATION  The nutrition facts label is a good place to find how  much sodium is in foods. Look for products with no more than 400 mg of sodium per serving.  Remember that 1.5 g = 1500 mg.  The food label may also list foods as:  Sodium-free: Less than 5 mg in a serving.  Very low sodium: 35 mg or less in a serving.  Low-sodium: 140 mg or less in a serving.  Light in sodium: 50% less sodium in a serving. For example, if a food that usually has 300 mg of sodium is changed to become light in sodium, it will have 150 mg of sodium.  Reduced sodium: 25% less sodium in a serving. For example, if a food that usually has 400 mg of sodium is changed to reduced sodium, it will have 300 mg of sodium.    CHOOSING FOODS  Grains  Avoid: Salted crackers and snack items. Some cereals, including instant hot cereals. Bread  stuffing and biscuit mixes. Seasoned rice or pasta mixes.  Choose: Unsalted snack items. Low-sodium cereals, oats, puffed wheat and rice, shredded wheat. English muffins and bread. Pasta.  Meats  Avoid: Salted, canned, smoked, spiced, pickled meats, including fish and poultry. Bacon, ham, sausage, cold cuts, hot dogs, anchovies.  Choose: Low-sodium canned tuna and salmon. Fresh or frozen meat, poultry, and fish.  Dairy  Avoid: Processed cheese and spreads. Cottage cheese. Buttermilk and condensed milk. Regular cheese.  Choose: Milk. Low-sodium cottage cheese. Yogurt. Sour cream. Low-sodium cheese.  Fruits and Vegetables  Avoid: Regular canned vegetables. Regular canned tomato sauce and paste. Frozen vegetables in sauces. Olives. Angie Fava. Relishes. Sauerkraut.  Choose: Low-sodium canned vegetables. Low-sodium tomato sauce and paste. Frozen or fresh vegetables. Fresh and frozen fruit.  Condiments  Avoid: Canned and packaged gravies. Worcestershire sauce. Tartar sauce. Barbecue sauce. Soy sauce. Steak sauce. Ketchup. Onion, garlic, and table salt. Meat flavorings and tenderizers.  Choose: Fresh and dried herbs and spices. Low-sodium varieties of mustard and  ketchup. Lemon juice. Tabasco sauce. Horseradish.    SAMPLE 1.5 GRAM SODIUM MEAL PLAN:   Breakfast / Sodium (mg)  1 cup low-fat milk / 143 mg  1 whole-wheat English muffin / 240 mg  1 tbs heart-healthy margarine / 153 mg  1 hard-boiled egg / 139 mg  1 small orange / 0 mg  Lunch / Sodium (mg)  1 cup raw carrots / 76 mg  2 tbs no salt added peanut butter / 5 mg  2 slices whole-wheat bread / 270 mg  1 tbs jelly / 6 mg   cup red grapes / 2 mg  Dinner / Sodium (mg)  1 cup whole-wheat pasta / 2 mg  1 cup low-sodium tomato sauce / 73 mg  3 oz lean ground beef / 57 mg  1 small side salad (1 cup raw spinach leaves,  cup cucumber,  cup yellow bell pepper) with 1 tsp olive oil and 1 tsp red wine vinegar / 25 mg  Snack / Sodium (mg)  1 container low-fat vanilla yogurt / 107 mg  3 graham cracker squares / 127 mg  Nutrient Analysis  Calories: 1745  Protein: 75 g  Carbohydrate: 237 g  Fat: 57 g  Sodium: 1425 mg  Document Released: 03/07/2005 Document Revised: 11/17/2010 Document Reviewed: 06/08/2009  [facility] Patient Information 2012 [place], [place].))

## 2017-09-06 ENCOUNTER — Other Ambulatory Visit: Payer: Self-pay | Admitting: Family Medicine

## 2017-09-06 DIAGNOSIS — I10 Essential (primary) hypertension: Principal | ICD-10-CM

## 2017-09-06 DIAGNOSIS — E1159 Type 2 diabetes mellitus with other circulatory complications: Secondary | ICD-10-CM

## 2017-09-08 ENCOUNTER — Encounter: Payer: BLUE CROSS/BLUE SHIELD | Attending: Family Medicine | Admitting: Registered"

## 2017-09-08 DIAGNOSIS — Z713 Dietary counseling and surveillance: Secondary | ICD-10-CM | POA: Insufficient documentation

## 2017-09-08 DIAGNOSIS — E1165 Type 2 diabetes mellitus with hyperglycemia: Secondary | ICD-10-CM

## 2017-09-08 NOTE — Patient Instructions (Signed)
Continue with your great exercise routine Continue to drink less sweetened beverages Use the DASH handout for ideas to include more plant-based foods Use the low-sodium seasoning sheet for ways to make food more flavorful

## 2017-09-08 NOTE — Progress Notes (Signed)
Diabetes Self-Management Education  Visit Type: Follow-up  Appt. Start Time: 0955 Appt. End Time: 1025  09/08/2017  Julie Prince, identified by name and date of birth, is a 57 y.o. female with a diagnosis of Diabetes:  .   ASSESSMENT Patient was in good spirits today and very happy about the progress she has been seeing with her blood sugar dropping, enjoyment of increased intensity of exercise, and some weight loss. Pt states she was at her great-granddaughters birthday party and felt good about the food choices she made. Patient states she has reduced her cheese intake and finds that when she does eat it she experiences gas.  Patient had questions about plant-based diet as well as ways to season food besides Mrs. Dash.  Pt states she has some stress going on right now with her dad in the hospital in [place] and also wanting to plan a trip to [place], but overall seems happy with her life situation.  Diabetes Self-Management Education - 09/08/17 0900      Visit Information   Visit Type  Follow-up      Complications   Last HgB A1C per patient/outside source  -- next text scheduled for Sept    How often do you check your blood sugar?  1-2 times/day    Fasting Blood glucose range (mg/dL)  >200 usually stays in 200s, down from 300-600      Dietary Intake   Breakfast  cereal, bacon, 1/2 c coffee    Snack (morning)  fruit & nuts    Lunch  salad from [place], no bread,     Snack (afternoon)  fruit    Dinner  spaghetti 1/2 cup,     Snack (evening)  peach, nuts    Beverage(s)  water, diluted sweettea      Exercise   Exercise Type  Moderate (swimming / aerobic walking)    How many days per week to you exercise?  5    How many minutes per day do you exercise?  60    Total minutes per week of exercise  300      Patient Education   Nutrition management   Role of diet in the treatment of diabetes and the relationship between the three main macronutrients and blood glucose level DASH  diet      Individualized Goals (developed by patient)   Nutrition  -- work toward a plant-based diet    Physical Activity  Exercise 5-7 days per week    Medications  take my medication as prescribed      Patient Self-Evaluation of Goals - Patient rates self as meeting previously set goals (% of time)   Nutrition  25 - 50%    Medications  >75%      Outcomes   Expected Outcomes  Demonstrated interest in learning. Expect positive outcomes    Future DMSE  2 months    Program Status  Completed     Individualized Plan for Diabetes Self-Management Training:   Learning Objective:  Patient will have a greater understanding of diabetes self-management. Patient education plan is to attend individual and/or group sessions per assessed needs and concerns.   Patient Instructions  Continue with your great exercise routine Continue to drink less sweetened beverages Use the DASH handout for ideas to include more plant-based foods Use the low-sodium seasoning sheet for ways to make food more flavorful  Expected Outcomes:  Demonstrated interest in learning. Expect positive outcomes  ducation material provided: No sodium seasonings, DASH  diet worksheets  If problems or questions, patient to contact team via:  Phone and MyChart  Future DSME appointment: 2 months

## 2017-11-01 ENCOUNTER — Telehealth: Payer: Self-pay | Admitting: Family Medicine

## 2017-11-01 NOTE — Telephone Encounter (Signed)
Called patient to notify of Appt cancellation due to provider being OOO---- Left message for Pt to call back--glh

## 2017-11-22 ENCOUNTER — Ambulatory Visit: Payer: BLUE CROSS/BLUE SHIELD | Admitting: Family Medicine

## 2017-11-28 LAB — HEMOGLOBIN A1C: Hgb A1c MFr Bld: 5.8 (ref 4.0–6.0)

## 2017-11-28 LAB — TSH: TSH: 0.99 (ref 0.41–5.90)

## 2017-11-30 ENCOUNTER — Other Ambulatory Visit: Payer: Self-pay

## 2017-11-30 DIAGNOSIS — Z794 Long term (current) use of insulin: Principal | ICD-10-CM

## 2017-11-30 DIAGNOSIS — E118 Type 2 diabetes mellitus with unspecified complications: Secondary | ICD-10-CM

## 2017-11-30 MED ORDER — SEMAGLUTIDE (1 MG/DOSE) 2 MG/1.5ML ~~LOC~~ SOPN: PEN_INJECTOR | SUBCUTANEOUS | 0 refills | Status: DC

## 2017-11-30 NOTE — Telephone Encounter (Signed)
Pharmacy sent refill request for Ozempic.  Reviewed and sent refills in per office policy. MPulliam, CMA/RT(R)

## 2017-12-01 ENCOUNTER — Other Ambulatory Visit: Payer: Self-pay | Admitting: Family Medicine

## 2017-12-02 ENCOUNTER — Other Ambulatory Visit: Payer: Self-pay | Admitting: Family Medicine

## 2017-12-02 DIAGNOSIS — I10 Essential (primary) hypertension: Principal | ICD-10-CM

## 2017-12-02 DIAGNOSIS — E1159 Type 2 diabetes mellitus with other circulatory complications: Secondary | ICD-10-CM

## 2017-12-20 ENCOUNTER — Ambulatory Visit: Payer: BLUE CROSS/BLUE SHIELD | Admitting: Family Medicine

## 2017-12-29 ENCOUNTER — Other Ambulatory Visit: Payer: Self-pay | Admitting: Family Medicine

## 2018-01-05 ENCOUNTER — Ambulatory Visit: Payer: BLUE CROSS/BLUE SHIELD | Admitting: Registered"

## 2018-01-25 ENCOUNTER — Other Ambulatory Visit: Payer: Self-pay | Admitting: Family Medicine

## 2018-01-25 DIAGNOSIS — Z794 Long term (current) use of insulin: Principal | ICD-10-CM

## 2018-01-25 DIAGNOSIS — E118 Type 2 diabetes mellitus with unspecified complications: Secondary | ICD-10-CM

## 2018-03-08 ENCOUNTER — Other Ambulatory Visit: Payer: Self-pay | Admitting: Internal Medicine

## 2018-03-08 DIAGNOSIS — R600 Localized edema: Secondary | ICD-10-CM

## 2018-03-09 ENCOUNTER — Ambulatory Visit
Admission: RE | Admit: 2018-03-09 | Discharge: 2018-03-09 | Disposition: A | Discharge status: Home / Self Care | Payer: BLUE CROSS/BLUE SHIELD | Source: Ambulatory Visit | Attending: Internal Medicine | Admitting: Internal Medicine

## 2018-03-09 DIAGNOSIS — R600 Localized edema: Secondary | ICD-10-CM

## 2018-03-17 ENCOUNTER — Other Ambulatory Visit: Payer: Self-pay

## 2018-03-17 ENCOUNTER — Emergency Department (HOSPITAL_BASED_OUTPATIENT_CLINIC_OR_DEPARTMENT_OTHER)
Admission: EM | Admit: 2018-03-17 | Discharge: 2018-03-17 | Disposition: A | Discharge status: Home / Self Care | Payer: BLUE CROSS/BLUE SHIELD | Attending: Emergency Medicine | Admitting: Emergency Medicine

## 2018-03-17 ENCOUNTER — Emergency Department (HOSPITAL_BASED_OUTPATIENT_CLINIC_OR_DEPARTMENT_OTHER): Payer: BLUE CROSS/BLUE SHIELD

## 2018-03-17 DIAGNOSIS — R000 Tachycardia, unspecified: Secondary | ICD-10-CM

## 2018-03-17 DIAGNOSIS — I10 Essential (primary) hypertension: Secondary | ICD-10-CM | POA: Diagnosis not present

## 2018-03-17 DIAGNOSIS — Z79899 Other long term (current) drug therapy: Secondary | ICD-10-CM | POA: Diagnosis not present

## 2018-03-17 DIAGNOSIS — E1159 Type 2 diabetes mellitus with other circulatory complications: Secondary | ICD-10-CM

## 2018-03-17 DIAGNOSIS — M25472 Effusion, left ankle: Secondary | ICD-10-CM

## 2018-03-17 DIAGNOSIS — E119 Type 2 diabetes mellitus without complications: Secondary | ICD-10-CM | POA: Insufficient documentation

## 2018-03-17 DIAGNOSIS — I152 Hypertension secondary to endocrine disorders: Secondary | ICD-10-CM

## 2018-03-17 DIAGNOSIS — Z87891 Personal history of nicotine dependence: Secondary | ICD-10-CM | POA: Diagnosis not present

## 2018-03-17 LAB — CBC WITH DIFFERENTIAL/PLATELET
Abs Immature Granulocytes: 0.02 10*3/uL (ref 0.00–0.07)
BASOS PCT: 0 %
Basophils Absolute: 0 10*3/uL (ref 0.0–0.1)
Eosinophils Absolute: 0.2 10*3/uL (ref 0.0–0.5)
Eosinophils Relative: 2 %
HCT: 43.5 % (ref 36.0–46.0)
Hemoglobin: 14.2 g/dL (ref 12.0–15.0)
Immature Granulocytes: 0 %
Lymphocytes Relative: 33 %
Lymphs Abs: 2.7 10*3/uL (ref 0.7–4.0)
MCH: 31.8 pg (ref 26.0–34.0)
MCHC: 32.6 g/dL (ref 30.0–36.0)
MCV: 97.3 fL (ref 80.0–100.0)
Monocytes Absolute: 0.8 10*3/uL (ref 0.1–1.0)
Monocytes Relative: 9 %
Neutro Abs: 4.5 10*3/uL (ref 1.7–7.7)
Neutrophils Relative %: 56 %
PLATELETS: 292 10*3/uL (ref 150–400)
RBC: 4.47 MIL/uL (ref 3.87–5.11)
RDW: 12.7 % (ref 11.5–15.5)
WBC: 8.2 10*3/uL (ref 4.0–10.5)
nRBC: 0 % (ref 0.0–0.2)

## 2018-03-17 LAB — BASIC METABOLIC PANEL
Anion gap: 5 (ref 5–15)
BUN: 9 mg/dL (ref 6–20)
CO2: 25 mmol/L (ref 22–32)
Calcium: 8.7 mg/dL — ABNORMAL LOW (ref 8.9–10.3)
Chloride: 111 mmol/L (ref 98–111)
Creatinine, Ser: 0.77 mg/dL (ref 0.44–1.00)
GFR calc Af Amer: >60 mL/min (ref >60)
Glucose, Bld: 83 mg/dL (ref 70–99)
Potassium: 3.8 mmol/L (ref 3.5–5.1)
SODIUM: 141 mmol/L (ref 135–145)

## 2018-03-17 LAB — D-DIMER, QUANTITATIVE: D-Dimer, Quant: 0.37 ug/mL-FEU (ref 0.00–0.50)

## 2018-03-17 MED ORDER — CARVEDILOL 6.25 MG PO TABS: 6.2500 mg | ORAL_TABLET | Freq: Two times a day (BID) | ORAL | 1 refills | Status: DC

## 2018-03-17 MED ORDER — SODIUM CHLORIDE 0.9 % IV BOLUS
500.0000 mL | Freq: Once | INTRAVENOUS | Status: AC
  Administered 2018-03-17: 500 mL via INTRAVENOUS

## 2018-03-17 MED ORDER — CARVEDILOL 6.25 MG PO TABS
6.2500 mg | ORAL_TABLET | Freq: Two times a day (BID) | ORAL | Status: DC
  Filled 2018-03-17: qty 1

## 2018-03-17 NOTE — ED Triage Notes (Signed)
Left foot swelling and pain x 1 month. Negative DVT doppler on 12.20.19.  Pain and swelling continues.

## 2018-03-17 NOTE — ED Provider Notes (Signed)
[facility] EMERGENCY DEPARTMENT Provider Note   CSN: [id number] Arrival date & time: 03/17/18  1341     History   Chief Complaint Chief Complaint  Patient presents with  . Leg Swelling    HPI Amantha Sklar is a 57 y.o. female.  The history is provided by the patient and medical records. No language interpreter was used.   Johnice Riebe is a 57 y.o. female who presents to the Emergency Department complaining of leg swelling. Presents to the emergency department for evaluation of three weeks of progressive swelling and pain to the left foot and ankle. She denies any injuries. She states that she has swelling and pain to the ankle and foot. Pain is only present on standing and walking. She denies any fevers, chills, chest pain, shortness of breath, nausea, vomiting. No prior similar symptoms. She does have a history of diabetes and hypertension. She had an outpatient ultrasound performed on December 20, that was negative for blood clot. Past Medical History:  Diagnosis Date  . Allergy   . Barrett esophagus   . Cataract    left cataract removal  . Essential hypertension, benign 09/12/2013  . GERD (gastroesophageal reflux disease)   . Hyperlipidemia 09/12/2013  . Type 2 diabetes mellitus ([place]) 09/12/2013   Dr. Posey Pronto at [facility]     Patient Active Problem List   Diagnosis Date Noted  . Diabetes mellitus due to underlying condition with stage 3 chronic kidney disease, without long-term current use of insulin ([place]) 07/12/2017  . Mixed diabetic hyperlipidemia associated with type 2 diabetes mellitus ([place]) 07/12/2017  . Hypertension associated with diabetes ([place]) 07/12/2017  . Obesity, Class III, morbid obesity  ([place]) 07/12/2017  . H/O medication noncompliance 07/12/2017  . Barrett's esophagus 03/11/2016  . 1st degree AV block 03/01/2016  . Tachycardia 03/01/2016  . Dysphagia 02/10/2016  . Bloating 02/10/2016  . Constipation 02/10/2016  . Vulvar abscess     . Abscess 06/21/2015  . Sepsis ([place]) 06/21/2015  . GERD (gastroesophageal reflux disease) 07/31/2014  . Adenomatous polyp of colon 04/03/2014  . Allergic rhinitis 01/23/2014  . CTS (carpal tunnel syndrome) 10/03/2013  . Uncontrolled type 2 diabetes mellitus with hyperglycemia ([place]) 10/03/2013  . Low back pain 10/03/2013  . Lumbar arthropathy 10/03/2013  . Lumbar degenerative disc disease 10/03/2013  . Neuropathy 10/03/2013  . Osteoarthritis 10/03/2013  . 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 Tenosynovitis of foot 10/03/2013  . Type 2 diabetes mellitus with complication, with long-term current use of insulin ([place]) 09/12/2013  . Hyperlipidemia 09/12/2013  . Essential hypertension, benign 09/12/2013  . Diabetic peripheral neuropathy associated with type 2 diabetes mellitus ([place]) 09/12/2013    Past Surgical History:  Procedure Laterality Date  . APPENDECTOMY    . CATARACT EXTRACTION Left   . CESAREAN SECTION  1990  . COLONOSCOPY    . right tube and ovary removed    . WISDOM TOOTH EXTRACTION       OB History    Gravida  4   Para  3   Term  2   Preterm  1   AB  1   Living  3     SAB  1   TAB  0   Ectopic  0   Multiple  0   Live Births  3            Home Medications    Prior to Admission medications   Medication Sig Start Date End Date Taking? Authorizing Provider  amLODipine (  NORVASC) 10 MG tablet Take 0.5 tablets (5 mg total) by mouth daily. 08/21/17   Mellody Dance, DO  aspirin 81 MG chewable tablet Chew by mouth daily.    [provider]  atorvastatin (LIPITOR) 40 MG tablet Take 40 mg by mouth daily. 06/20/17   [provider]  carvedilol (COREG) 6.25 MG tablet Take 1 tablet (6.25 mg total) by mouth 2 (two) times daily with a meal. 03/17/18   Quintella Reichert, MD  insulin degludec (TRESIBA FLEXTOUCH) 100 UNIT/ML SOPN FlexTouch Pen Inject 50 units into skin daily at 10 pm.  PATIENT MUST HAVE OFFICE VISIT PRIOR TO ANY FURTHER REFILLS.  12/29/17   Opalski, Deborah, DO  Insulin Pen Needle (B-D UF III MINI PEN NEEDLES) 31G X 5 MM MISC Patient to use to inject insulin. 08/11/17   Opalski, Deborah, DO  OZEMPIC, 1 MG/DOSE, 2 MG/1.5ML SOPN PLEASE SEE ATTACHED FOR DETAILED DIRECTIONS 01/26/18   Opalski, Neoma Laming, DO  pantoprazole (PROTONIX) 40 MG tablet Take 40 mg by mouth 2 (two) times daily.    [provider]  triamcinolone cream (KENALOG) 0.1 % Apply 1 application topically 2 (two) times daily. 09/05/17   Opalski, Deborah, DO  Vitamin D, Ergocalciferol, (DRISDOL) 50000 units CAPS capsule TAKE 1 CAPSULE (50,000 UNITS TOTAL) BY MOUTH EVERY 7 (SEVEN) DAYS. TAKE FOR 8 TOTAL DOSES(WEEKS) Patient not taking: Reported on 09/08/2017 07/25/16   Emeterio Reeve, DO    Family History Family History  Problem Relation Age of Onset  . Lung cancer Mother   . Heart disease Maternal Aunt   . Colon cancer Neg Hx   . Esophageal cancer Neg Hx   . Rectal cancer Neg Hx   . Stomach cancer Neg Hx   . Pancreatic cancer Neg Hx     Social History Social History   Tobacco Use  . Smoking status: Former Smoker    Packs/day: 0.50  . Smokeless tobacco: Never Used  . Tobacco comment: quit 30 plus years ago.  Substance Use Topics  . Alcohol use: No    Alcohol/week: 0.0 standard drinks  . Drug use: No     Allergies   Penicillins and Metformin and related   Review of Systems Review of Systems  All other systems reviewed and are negative.    Physical Exam Updated Vital Signs BP (!) 162/103   Pulse (!) 122   Temp 98.2 F (36.8 C) (Oral)   Resp 19   Ht 5\' 4"  (1.626 m)   Wt 94.3 kg   SpO2 100%   BMI 35.70 kg/m   Physical Exam Vitals signs and nursing note reviewed.  Constitutional:      Appearance: She is well-developed.  HENT:     Head: Normocephalic and atraumatic.  Cardiovascular:     Rate and Rhythm: Regular rhythm.     Heart sounds: No murmur.     Comments: Tachycardic Pulmonary:     Effort: Pulmonary effort is  normal. No respiratory distress.     Breath sounds: Normal breath sounds.  Abdominal:     Palpations: Abdomen is soft.     Tenderness: There is no abdominal tenderness. There is no guarding or rebound.  Musculoskeletal:     Comments: 2+ DP pulses moderate edema to the right ankle and proximal foot. There is no significant tenderness to palpation throughout the foot and ankle. There is no overlying erythema or skin rash. Mild edema extends to the mid calf. There is no calf tenderness to palpation. Flexion/extension intact at the  ankle.  Skin:    General: Skin is warm and dry.     Capillary Refill: Capillary refill takes less than 2 seconds.  Neurological:     Mental Status: She is alert and oriented to person, place, and time.  Psychiatric:        Mood and Affect: Mood normal.        Behavior: Behavior normal.      ED Treatments / Results  Labs (all labs ordered are listed, but only abnormal results are displayed) Labs Reviewed  BASIC METABOLIC PANEL - Abnormal; Notable for the following components:      Result Value   Calcium 8.7 (*)    All other components within normal limits  CBC WITH DIFFERENTIAL/PLATELET  D-DIMER, QUANTITATIVE (NOT AT [facility])    EKG EKG Interpretation  Date/Time:  Saturday March 17 2018 15:41:38 EST Ventricular Rate:  132 PR Interval:    QRS Duration: 73 QT Interval:  354 QTC Calculation: 525 R Axis:   -18 Text Interpretation:  Sinus or ectopic atrial tachycardia Inferior infarct, old Prolonged QT interval Baseline wander in lead(s) V4 Confirmed by Quintella Reichert [phone]) on 03/17/2018 3:46:54 PM   Radiology Dg Ankle Complete Left  Result Date: 03/17/2018 CLINICAL DATA:  57 year-old female c/o LEFT foot/ankle swelling x 3 weeks. C/O lateral pain. Pt denies injury. EXAM: LEFT ANKLE COMPLETE - 3+ VIEW COMPARISON:  None. FINDINGS: There is diffuse soft tissue swelling of the ankle, affecting the LATERAL portion greater than the MEDIAL portion. No  acute fracture or subluxation. Note is made of atherosclerotic calcification of the small vessels at the ankle. Small plantar calcaneal spur is present. IMPRESSION: 1. Soft tissue swelling. 2. No evidence for acute osseous abnormality. Electronically Signed   By: Nolon Nations M.D.   On: 03/17/2018 16:23   Dg Foot Complete Left  Result Date: 03/17/2018 CLINICAL DATA:  Lateral foot pain and ankle swelling x3 weeks. EXAM: LEFT FOOT - COMPLETE 3+ VIEW COMPARISON:  None. FINDINGS: Mild-to-moderate soft tissue swelling is noted about the foot and ankle without underlying fracture or joint dislocation. Vascular calcifications which can be seen in diabetes is noted crossing the ankle joint and dorsum of the forefoot. Calcaneal enthesopathy is noted along plantar aspect. The tibiotalar, subtalar and midfoot articulations are maintained. Accessory ossicles are present adjacent to the cuboid with minimal enthesopathy at the base of the fifth metatarsal. Tiny calcifications are also present adjacent to medial base of the first metatarsal possibly vascular or joint capsular in etiology. IMPRESSION: Soft tissue swelling of the foot and ankle without acute osseous abnormality. Calcaneal enthesopathy. Electronically Signed   By: Ashley Royalty M.D.   On: 03/17/2018 16:23    Procedures Procedures (including critical care time)  Medications Ordered in ED Medications  carvedilol (COREG) tablet 6.25 mg (6.25 mg Oral Not Given 03/17/18 1558)  sodium chloride 0.9 % bolus 500 mL (500 mLs Intravenous New Bag/Given 03/17/18 1600)     Initial Impression / Assessment and Plan / ED Course  I have reviewed the triage vital signs and the nursing notes.  Pertinent labs & imaging results that were available during my care of the patient were reviewed by me and considered in my medical decision making (see chart for details).     Patient with history of diabetes here for evaluation of left ankle swelling, pain with  ambulation. She is in no acute distress in the emergency department. She is found to be tachycardic but asymptomatic. On record review she  does have a history of tachycardia on multiple prior visits to PCP. She has been out of her Coreg for the last month. In terms of her leg edema no evidence of infection, gout. Plain films negative for fracture. Presentation is not consistent with a DVT, CHF. Discussed with patient home care for edema as well as tachycardia. Discussed outpatient follow-up as well as return precautions.  Final Clinical Impressions(s) / ED Diagnoses   Final diagnoses:  Ankle swelling, left  Sinus tachycardia by electrocardiogram    ED Discharge Orders         Ordered    carvedilol (COREG) 6.25 MG tablet  2 times daily with meals     03/17/18 1724           Quintella Reichert, MD 03/17/18 1727

## 2018-03-17 NOTE — ED Notes (Signed)
Pt denies SOB, chest pain, dizziness, injury to foot.

## 2018-03-17 NOTE — Discharge Instructions (Addendum)
The cause of your ankle swelling was not identified today. Please be sure to take your Coreg as prescribed. Get rechecked immediately if you develop fevers, increased pain, difficulty breathing or new concerning symptoms. You may use the ace wrap for swelling. Elevate your leg when you are at rest to help decrease swelling.

## 2018-04-05 ENCOUNTER — Other Ambulatory Visit: Payer: Self-pay | Admitting: Family Medicine

## 2018-04-05 DIAGNOSIS — E118 Type 2 diabetes mellitus with unspecified complications: Secondary | ICD-10-CM

## 2018-04-05 DIAGNOSIS — Z794 Long term (current) use of insulin: Principal | ICD-10-CM

## 2018-04-11 ENCOUNTER — Encounter: Payer: Self-pay | Admitting: Family Medicine

## 2018-04-11 ENCOUNTER — Ambulatory Visit (INDEPENDENT_AMBULATORY_CARE_PROVIDER_SITE_OTHER): Payer: BLUE CROSS/BLUE SHIELD | Admitting: Family Medicine

## 2018-04-11 VITALS — BP 140/85 | HR 130 | Temp 98.4°F | Ht 65.0 in | Wt 209.2 lb

## 2018-04-11 DIAGNOSIS — Z794 Long term (current) use of insulin: Secondary | ICD-10-CM

## 2018-04-11 DIAGNOSIS — E0822 Diabetes mellitus due to underlying condition with diabetic chronic kidney disease: Secondary | ICD-10-CM

## 2018-04-11 DIAGNOSIS — M65349 Trigger finger, unspecified ring finger: Secondary | ICD-10-CM

## 2018-04-11 DIAGNOSIS — M79672 Pain in left foot: Secondary | ICD-10-CM

## 2018-04-11 DIAGNOSIS — E118 Type 2 diabetes mellitus with unspecified complications: Secondary | ICD-10-CM | POA: Diagnosis not present

## 2018-04-11 DIAGNOSIS — E1169 Type 2 diabetes mellitus with other specified complication: Secondary | ICD-10-CM

## 2018-04-11 DIAGNOSIS — N183 Chronic kidney disease, stage 3 unspecified: Secondary | ICD-10-CM

## 2018-04-11 DIAGNOSIS — E1159 Type 2 diabetes mellitus with other circulatory complications: Secondary | ICD-10-CM | POA: Diagnosis not present

## 2018-04-11 DIAGNOSIS — R000 Tachycardia, unspecified: Secondary | ICD-10-CM

## 2018-04-11 DIAGNOSIS — E782 Mixed hyperlipidemia: Secondary | ICD-10-CM

## 2018-04-11 DIAGNOSIS — I10 Essential (primary) hypertension: Secondary | ICD-10-CM

## 2018-04-11 DIAGNOSIS — E08319 Diabetes mellitus due to underlying condition with unspecified diabetic retinopathy without macular edema: Secondary | ICD-10-CM

## 2018-04-11 DIAGNOSIS — Z09 Encounter for follow-up examination after completed treatment for conditions other than malignant neoplasm: Secondary | ICD-10-CM | POA: Diagnosis not present

## 2018-04-11 DIAGNOSIS — I152 Hypertension secondary to endocrine disorders: Secondary | ICD-10-CM

## 2018-04-11 NOTE — Progress Notes (Addendum)
Impression and Recommendations:    1. Encounter for examination following treatment at hospital   2. Type 2 diabetes mellitus with complication, with long-term current use of insulin ([place])   3. Hypertension associated with diabetes ([place])   4. Diabetes mellitus due to underlying condition with stage 3 chronic kidney disease, without long-term current use of insulin ([place])   5. Mixed diabetic hyperlipidemia associated with type 2 diabetes mellitus ([place])   6. Tachycardia with hypertension   7. Foot pain, left   8. Trigger ring finger, unspecified laterality   9. Diabetic retinopathy associated with diabetes mellitus due to underlying condition, macular edema presence unspecified, unspecified laterality, unspecified retinopathy severity ([place])     - Per patient, only docs she sees:  follows up here and with Dr. Buddy Duty.   1. Chronic Left Lateral Ankle Pain & L Foot Swelling - Recent ED Visit (03/17/2018) - Extensive education provided to patient today.  All questions were answered.  -Referral provided to Sports Medicine today for Chronic Foot Pain (also has trigger fingers she would like to discuss with them in future)   - Regarding pt's recent hospitalization and/or ED visit: reviewed in detail recent hospitalization notes, clinical lab tests, tests in the radiology section of CPT, tests in the medicine testing of CPT, and obtained history from patient.  - Independent visualization of image, tracing, or specimen itself done by me today.  Re-evaluated foot x-rays done 03/17/2018 and I do not appreciate any fracture along the fifth ray, or bony abnormality otherwise.    2. Diabetes Mellitus - Following Dr. Buddy Duty of Endocrinology for diabetes management. - Patient's last A1c on 12/27/2017 was 5.8  - Most recent A1c, 5.4. - A1c super well-controlled at this time. - Cautioned pt re: low BS's which pt will ween insulin  - Continue to follow up for diabetes management with specialist (Dr.  Buddy Duty) as recommended by them.   3. Hypertension w/ Tachycardia- new prob to me  - Blood pressure elevated on intake today.  - EKG ordered today.  See scanned in sheet  - Abnormalities seen on EKG, however, patient asymptomatic, so we will call cardiology to see if we can get her in within the next 24 hours.    - Extensively discussed red-flag cardiac symptoms with patient, and if she develops any chest pain, SOB, dizziness, headache, or feeling bad/unusual in any way, then she will acutely go to the emergency room/ dial 911 and not wait to be seen by cardiology tomorrow.  - Patient has had similar symptoms since 12 of 2017- this tachycardia is not new to pt.  - referral placed to cardiology today-   pt will be seen tomorrow.  - Because TSH was low-normal back in September 2019, we will check TSH, free T3, and free T4 today, as rule-out for thyroid cause of tachycardia/ hypertension.  - Ambulatory BP monitoring encouraged. Keep log and bring in next OV.   4. BMI Counseling - Body mass index is 34.81 kg/m. Explained to patient what BMI refers to, and what it means medically.    Told patient to think about it as a "medical risk stratification measurement" and how increasing BMI is associated with increasing risk/ or worsening state of various diseases such as hypertension, hyperlipidemia, diabetes, premature OA, depression etc.  American Heart Association guidelines for healthy diet, basically Mediterranean diet, and exercise guidelines of 30 minutes 5 days per week or more discussed in detail.  Health counseling performed.  All  questions answered.   5. Lifestyle & Preventative Health Maintenance - Advised patient to continue working toward exercising to improve overall mental, physical, and emotional health.    - Reviewed the "spokes of the wheel" of mood and health management.  Stressed the importance of ongoing prudent habits, including regular exercise, appropriate sleep hygiene,  healthful dietary habits, and prayer/meditation to relax.  - Encouraged patient to engage in daily physical activity, especially a formal exercise routine.  Recommended that the patient eventually strive for at least 150 minutes of moderate cardiovascular activity per week according to guidelines established by the [facility].   - Healthy dietary habits encouraged, including low-carb, and high amounts of lean protein in diet.   - Patient should also consume adequate amounts of water.   Education and routine counseling performed. Handouts provided.  Pt was interviewed and evaluated by me in the clinic today for 32.5+ minutes, with over 50% time spent in face to face counseling of patients various medical conditions, treatment plans of those medical conditions including medicine management and lifestyle modification, strategies to improve health and well being; and in coordination of care. SEE ABOVE TREATMENT PLAN FOR DETAILS   Orders Placed This Encounter  Procedures  . TSH+T4F+T3Free  . Ambulatory referral to Sports Medicine  . Ambulatory referral to Cardiology    Meds ordered this encounter  Medications  . insulin degludec (TRESIBA FLEXTOUCH) 100 UNIT/ML SOPN FlexTouch Pen    Sig: Inject 50 units into skin daily at 10 pm. ( Med mgt per Dr Buddy Duty of Endo )    Dispense:  1 pen    Refill:  0  . Insulin Pen Needle (B-D UF III MINI PEN NEEDLES) 31G X 5 MM MISC    Sig: Patient to use to inject insulin. ( Med per Dr Buddy Duty of Endo )    Dispense:  100 each    Refill:  11  . Semaglutide, 1 MG/DOSE, (OZEMPIC, 1 MG/DOSE,) 2 MG/1.5ML SOPN    Sig: Inject 1 mg into the skin once a week. ( Med Mgt per Dr Buddy Duty of Endo )    Medications Discontinued During This Encounter  Medication Reason  . triamcinolone cream (KENALOG) 0.1 %   . pantoprazole (PROTONIX) 40 MG tablet   . insulin degludec (TRESIBA FLEXTOUCH) 100 UNIT/ML SOPN FlexTouch Pen   . Insulin Pen Needle (B-D UF III MINI PEN NEEDLES) 31G X 5 MM  MISC   . OZEMPIC, 1 MG/DOSE, 2 MG/1.5ML SOPN      The patient was counseled, risk factors were discussed, anticipatory guidance given.  Gross side effects, risk and benefits, and alternatives of medications discussed with patient.  Patient is aware that all medications have potential side effects and we are unable to predict every side effect or drug-drug interaction that may occur.  Expresses verbal understanding and consents to current therapy plan and treatment regimen.  Return for 2) Follow-up with me in 3 months or sooner if concerns.  Please see AVS handed out to patient at the end of our visit for further patient instructions/ counseling done pertaining to today's office visit.    Note:  This document was prepared using Dragon voice recognition software and may include unintentional dictation errors.  This document serves as a record of services personally performed by Mellody Dance, DO. It was created on her behalf by Toni Amend, a trained medical scribe. The creation of this record is based on the scribe's personal observations and the provider's statements to them.  I have reviewed the above medical documentation for accuracy and completeness and I concur.  Mellody Dance, DO 04/20/2018 6:06 PM        Subjective:    HPI: Julie Prince is a 58 y.o. female who presents to [facility] today for follow up of RECENT ED visit and f/up CHRONIC CONDITIONS.    States she is feeling good overall.   History of Trigger Fingers Has had trigger fingers both fourth digits bilaterally.  Pt desires injections- had in past by sprts med   Left Foot Pain & Swelling Notes she hasn't been exercising much due to her foot pain.  Comments that she has had foot pain for an entire month.  Woke up one day about a month ago, and her left foot was swollen and her foot was hurting.  It currently still hurts.  Cannot remember an injury, twisting her foot,  anything.  Was seen 03/17/2018 for her leg in the ED.  States she went to [facility] and was seen immediately.  "It was so swollen."    Per pt, Dr. Buddy Duty did a rule-out for blood clots, and "did it again just to make sure."  Says that "everything looked good at the ED."  Has no issues breathing while lying flat at night, "no issue going to sleep or nothing."  She's been wearing her flat shoes.  States that one foot looked "fatter than the other" when her foot swelled up, and she did experience swelling up her leg.  Has been using a crutch borrowed from a family member to help lessen weight on the area, and has been propping her leg up at night.   DM HPI:  -  She has been working hard on diet and exercise for diabetes.  Medication compliance - good, but pt modifies her insulin dose on her own now depending on how well controlled sugars are.  Plan is to get off if Dr Buddy Duty confers  Home glucose readings range: States average on machine is 90.   Denies hypo/ hyperglycemia symptoms; denies having too low of BS's.   Last diabetic eye exam was  Lab Results  Component Value Date   HMDIABEYEEXA Retinopathy (A) 07/07/2017    Foot exam- UTD  Last A1C in the office was:  Lab Results  Component Value Date   HGBA1C 5.8 11/28/2017   HGBA1C 10.1 (H) 07/21/2017   HGBA1C 9.5 07/12/2017    Lab Results  Component Value Date   MICROALBUR 10 09/05/2017   LDLCALC 72 07/21/2017   CREATININE 0.77 03/17/2018    Wt Readings from Last 3 Encounters:  04/19/18 205 lb (93 kg)  04/12/18 207 lb 3.2 oz (94 kg)  04/11/18 209 lb 3.2 oz (94.9 kg)    BP Readings from Last 3 Encounters:  04/19/18 (!) 151/82  04/12/18 (!) 143/85  04/11/18 140/85    HTN:  -  Her blood pressure has been controlled at home.  Pt has been checking it regularly.  Remarks that her BP is running around 128/80 at home, with pulse in the 80's.  States that she has been able to feel her pulse at night and hear her heartbeat  at night on her pillow.  "It's been a while that I've been hearing that, ever since last year."  Patient does not have a cardiologist.  - Patient reports good compliance with blood pressure medications.  - Denies medication S-E   - Smoking Status noted   -  She denies new onset of: chest pain, exercise intolerance, shortness of breath, dizziness, visual changes, headache, lower extremity swelling or claudication.   Last 3 blood pressure readings in our office are as follows: BP Readings from Last 3 Encounters:  04/19/18 (!) 151/82  04/12/18 (!) 143/85  04/11/18 140/85    Pulse Readings from Last 3 Encounters:  04/12/18 (!) 128  04/11/18 (!) 130  03/17/18 (!) 118    Filed Weights   04/11/18 1456  Weight: 209 lb 3.2 oz (94.9 kg)      Patient Care Team    Relationship Specialty Notifications Start End  Mellody Dance, DO PCP - General Family Medicine  07/12/17   Pyrtle, Lajuan Lines, MD Consulting Physician Gastroenterology  07/13/17   Delrae Rend, MD Consulting Physician Endocrinology  07/13/17   Emily Filbert, MD Consulting Physician Obstetrics and Gynecology  07/13/17   Christella Hartigan, RD Dietitian Dietician  09/05/17      Lab Results  Component Value Date   CREATININE 0.77 03/17/2018   BUN 9 03/17/2018   NA 141 03/17/2018   K 3.8 03/17/2018   CL 111 03/17/2018   CO2 25 03/17/2018    Lab Results  Component Value Date   CHOL 127 07/21/2017   CHOL 229 (H) 04/26/2016   CHOL 257 (H) 05/07/2015    Lab Results  Component Value Date   HDL 35 (L) 07/21/2017   HDL 36 (L) 04/26/2016   HDL 43 (L) 05/07/2015    Lab Results  Component Value Date   LDLCALC 72 07/21/2017   LDLCALC 161 (H) 04/26/2016   LDLCALC 182 (H) 05/07/2015    Lab Results  Component Value Date   TRIG 102 07/21/2017   TRIG 158 (H) 04/26/2016   TRIG 162 (H) 05/07/2015    Lab Results  Component Value Date   CHOLHDL 3.6 07/21/2017   CHOLHDL 6.4 (H) 04/26/2016   CHOLHDL 6.0 (H) 05/07/2015     No results found for: LDLDIRECT ===================================================================   Patient Active Problem List   Diagnosis Date Noted  . Foot pain, left 04/20/2018  . Trigger ring finger 04/20/2018  . Diabetic retinopathy associated with diabetes mellitus due to underlying condition ([place]) 04/20/2018  . 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 Diabetes mellitus due to underlying condition with stage 3 chronic kidney disease, without long-term current use of insulin ([place]) 07/12/2017  . Mixed diabetic hyperlipidemia associated with type 2 diabetes mellitus ([place]) 07/12/2017  . Hypertension associated with diabetes ([place]) 07/12/2017  . Obesity, Class III, morbid obesity  ([place]) 07/12/2017  . H/O medication noncompliance 07/12/2017  . Barrett's esophagus 03/11/2016  . 1st degree AV block 03/01/2016  . Tachycardia with hypertension 03/01/2016  . Dysphagia 02/10/2016  . Bloating 02/10/2016  . Constipation 02/10/2016  . Vulvar abscess   . Abscess 06/21/2015  . Sepsis ([place]) 06/21/2015  . GERD (gastroesophageal reflux disease) 07/31/2014  . Adenomatous polyp of colon 04/03/2014  . Allergic rhinitis 01/23/2014  . CTS (carpal tunnel syndrome) 10/03/2013  . Uncontrolled type 2 diabetes mellitus with hyperglycemia ([place]) 10/03/2013  . Low back pain 10/03/2013  . Lumbar arthropathy 10/03/2013  . Lumbar degenerative disc disease 10/03/2013  . Neuropathy 10/03/2013  . Osteoarthritis 10/03/2013  . 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 Tenosynovitis of foot 10/03/2013  . Type 2 diabetes mellitus with complication, with long-term current use of insulin ([place]) 09/12/2013  . Hyperlipidemia 09/12/2013  . Essential hypertension, benign 09/12/2013  . Diabetic peripheral neuropathy associated with type 2 diabetes mellitus ([place])  09/12/2013     Past Medical History:  Diagnosis Date  . Allergy   . Barrett esophagus   . Cataract    left cataract removal  . Essential  hypertension, benign 09/12/2013  . GERD (gastroesophageal reflux disease)   . Hyperlipidemia 09/12/2013  . 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 Type 2 diabetes mellitus ([place]) 09/12/2013   Dr. Posey Pronto at [place]      Past Surgical History:  Procedure Laterality Date  . APPENDECTOMY    . CATARACT EXTRACTION Left   . CESAREAN SECTION  1990  . COLONOSCOPY    . right tube and ovary removed    . WISDOM TOOTH EXTRACTION       Family History  Problem Relation Age of Onset  . Lung cancer Mother   . Stroke Father   . Heart disease Maternal Aunt   . Fibromyalgia Sister   . Alzheimer's disease Maternal Grandmother   . Colon cancer Neg Hx   . Esophageal cancer Neg Hx   . Rectal cancer Neg Hx   . Stomach cancer Neg Hx   . Pancreatic cancer Neg Hx      Social History   Substance and Sexual Activity  Drug Use No  ,  Social History   Substance and Sexual Activity  Alcohol Use No  . Alcohol/week: 0.0 standard drinks  ,  Social History   Tobacco Use  Smoking Status Former Smoker  . Packs/day: 0.50  Smokeless Tobacco Never Used  Tobacco Comment   quit 30 plus years ago.  ,    Current Outpatient Medications on File Prior to Visit  Medication Sig Dispense Refill  . amLODipine (NORVASC) 10 MG tablet Take 0.5 tablets (5 mg total) by mouth daily. (Patient not taking: Reported on 04/19/2018) 45 tablet 1  . aspirin 81 MG chewable tablet Chew by mouth daily.    Marland Kitchen atorvastatin (LIPITOR) 40 MG tablet Take 40 mg by mouth daily.  3   Current Facility-Administered Medications on File Prior to Visit  Medication Dose Route Frequency Provider Last Rate Last Dose  . 0.9 %  sodium chloride infusion  500 mL Intravenous Once Pyrtle, Lajuan Lines, MD         Allergies  Allergen Reactions  . Penicillins Hives and Shortness Of Breath  . Metformin And Related Diarrhea     Review of Systems:   General:  Denies fever, chills Optho/Auditory:   Denies visual changes, blurred  vision Respiratory:   Denies SOB, cough, wheeze, DIB  Cardiovascular:   Denies chest pain, palpitations, painful respirations Gastrointestinal:   Denies nausea, vomiting, diarrhea.  Endocrine:     Denies new hot or cold intolerance Musculoskeletal:  Denies joint swelling, gait issues, or new unexplained myalgias/ arthralgias Skin:  Denies rash, suspicious lesions  Neurological:    Denies dizziness, unexplained weakness, numbness  Psychiatric/Behavioral:   Denies mood changes  Objective:    Blood pressure 140/85, pulse (!) 130, temperature 98.4 F (36.9 C), height 5\' 5"  (1.651 m), weight 209 lb 3.2 oz (94.9 kg), SpO2 98 %.  Body mass index is 34.81 kg/m.  General: Well Developed, well nourished, and in no acute distress.  HEENT: Normocephalic, atraumatic, pupils equal round reactive to light, neck supple, No carotid bruits, no JVD Skin: Warm and dry, cap RF less 2 sec Cardiac: Regular rate and rhythm, S1, S2 WNL's, no murmurs rubs or gallops Respiratory: ECTA B/L, Not using accessory muscles, speaking in full sentences. NeuroM-Sk: Ambulates w/o assistance, moves ext *  4 w/o difficulty, sensation grossly intact.  Ext: scant edema b/l lower ext Psych: No HI/SI, judgement and insight good, Euthymic mood. Full Affect. Left LE (Foot): Obvious swelling over entire ankle joint, medially and laterally.  Distal fibula, anteriorly and posteriorly, is tender.  Tenderness also along the lateral ligaments of the ankle, as well as max tenderness at the base of the fifth metatarsal.  No plantar fasciitis type symptoms.  At the palmar surface of the foot, there's a prominence along the base of the fifth metatarsal.

## 2018-04-12 ENCOUNTER — Ambulatory Visit: Payer: BLUE CROSS/BLUE SHIELD | Admitting: Cardiovascular Disease

## 2018-04-12 ENCOUNTER — Encounter: Payer: Self-pay | Admitting: Cardiovascular Disease

## 2018-04-12 VITALS — BP 143/85 | HR 128 | Ht 64.0 in | Wt 207.2 lb

## 2018-04-12 DIAGNOSIS — R609 Edema, unspecified: Secondary | ICD-10-CM

## 2018-04-12 DIAGNOSIS — R000 Tachycardia, unspecified: Secondary | ICD-10-CM | POA: Diagnosis not present

## 2018-04-12 DIAGNOSIS — I739 Peripheral vascular disease, unspecified: Secondary | ICD-10-CM

## 2018-04-12 DIAGNOSIS — I10 Essential (primary) hypertension: Secondary | ICD-10-CM

## 2018-04-12 DIAGNOSIS — I4711 Inappropriate sinus tachycardia, so stated: Secondary | ICD-10-CM

## 2018-04-12 DIAGNOSIS — E7800 Pure hypercholesterolemia, unspecified: Secondary | ICD-10-CM

## 2018-04-12 HISTORY — DX: Tachycardia, unspecified: R00.0

## 2018-04-12 HISTORY — DX: Inappropriate sinus tachycardia, so stated: I47.11

## 2018-04-12 LAB — TSH+T4F+T3FREE
Free T4: 1.28 ng/dL (ref 0.82–1.77)
T3, Free: 2.5 pg/mL (ref 2.0–4.4)
TSH: 2.08 u[IU]/mL (ref 0.450–4.500)

## 2018-04-12 MED ORDER — METOPROLOL TARTRATE 50 MG PO TABS: 50.0000 mg | ORAL_TABLET | Freq: Two times a day (BID) | ORAL | 3 refills | Status: DC

## 2018-04-12 NOTE — Patient Instructions (Signed)
Medication Instructions:  STOP CARVEDILOL   START METOPROLOL TART 50 MG TWICE A DAY   If you need a refill on your cardiac medications before your next appointment, please call your pharmacy.   Lab work: NONE  Testing/Procedures: Your physician has requested that you have an echocardiogram. Echocardiography is a painless test that uses sound waves to create images of your heart. It provides your doctor with information about the size and shape of your heart and how well your heart's chambers and valves are working. This procedure takes approximately one hour. There are no restrictions for this procedure. [place] [address]  Your physician has requested that you have an ankle brachial index (ABI). During this test an ultrasound and blood pressure cuff are used to evaluate the arteries that supply the arms and legs with blood. Allow thirty minutes for this exam. There are no restrictions or special instructions.  Your physician has requested that you have a lower or upper extremity arterial duplex. This test is an ultrasound of the arteries in the legs or arms. It looks at arterial blood flow in the legs and arms. Allow one hour for Lower and Upper Arterial scans. There are no restrictions or special instructions  Follow-Up: Your physician recommends that you schedule a follow-up appointment in: WITH DR Banner [facility] AFTER TESTS COMPLETED   Any Other Special Instructions Will Be Listed Below (If Applicable).  Echocardiogram An echocardiogram is a procedure that uses painless sound waves (ultrasound) to produce an image of the heart. Images from an echocardiogram can provide important information about:  Signs of coronary artery disease (CAD).  Aneurysm detection. An aneurysm is a weak or damaged part of an artery wall that bulges out from the normal force of blood pumping through the body.  Heart size and shape. Changes in the size or shape of the heart can be  associated with certain conditions, including heart failure, aneurysm, and CAD.  Heart muscle function.  Heart valve function.  Signs of a past heart attack.  Fluid buildup around the heart.  Thickening of the heart muscle.  A tumor or infectious growth around the heart valves. Tell a health care provider about:  Any allergies you have.  All medicines you are taking, including vitamins, herbs, eye drops, creams, and over-the-counter medicines.  Any blood disorders you have.  Any surgeries you have had.  Any medical conditions you have.  Whether you are pregnant or may be pregnant. What are the risks? Generally, this is a safe procedure. However, problems may occur, including:  Allergic reaction to dye (contrast) that may be used during the procedure. What happens before the procedure? No specific preparation is needed. You may eat and drink normally. What happens during the procedure?   An IV tube may be inserted into one of your veins.  You may receive contrast through this tube. A contrast is an injection that improves the quality of the pictures from your heart.  A gel will be applied to your chest.  A wand-like tool (transducer) will be moved over your chest. The gel will help to transmit the sound waves from the transducer.  The sound waves will harmlessly bounce off of your heart to allow the heart images to be captured in real-time motion. The images will be recorded on a computer. The procedure may vary among health care providers and hospitals. What happens after the procedure?  You may return to your normal, everyday life, including diet, activities,  and medicines, unless your health care provider tells you not to do that. Summary  An echocardiogram is a procedure that uses painless sound waves (ultrasound) to produce an image of the heart.  Images from an echocardiogram can provide important information about the size and shape of your heart, heart  muscle function, heart valve function, and fluid buildup around your heart.  You do not need to do anything to prepare before this procedure. You may eat and drink normally.  After the echocardiogram is completed, you may return to your normal, everyday life, unless your health care provider tells you not to do that. This information is not intended to replace advice given to you by your health care provider. Make sure you discuss any questions you have with your health care provider. Document Released: 03/04/2000 Document Revised: 04/09/2016 Document Reviewed: 04/09/2016 Elsevier Interactive Patient Education  2019 [place] Index Test Why am I having this test? The ankle-brachial index (ABI) test is used to diagnose peripheral vascular disease (PVD). PVD is also known as peripheral arterial disease (PAD). PVD is the blocking or hardening of the arteries anywhere within the circulatory system beyond the heart. PVD is caused by:  Cholesterol deposits in your blood vessels (atherosclerosis). This is the most common cause of this condition.  Irritation and swelling (inflammation) in the blood vessels.  Blood clots in the vessels. Cholesterol deposits cause arteries to narrow. Normal delivery of oxygen to your tissues is affected, causing muscle pain and fatigue. This is called claudication. PVD means that there may also be a buildup of cholesterol:  In your heart. This increases the risk of heart attacks.  In your brain. This increases the risk of strokes. What is being tested? The ankle-brachial index test measures the blood flow in your arms and legs. The blood flow will show if blood vessels in your legs have been narrowed by cholesterol deposits. How do I prepare for this test?  Wear loose clothing.  Do not use any tobacco products, including cigarettes, chewing tobacco, or e-cigarettes, for at least 30 minutes before the test. What happens during the  test?  1. You will lie down in a resting position. 2. Your health care provider will use a blood pressure machine and a small ultrasound device (Doppler) to measure the systolic pressures on your upper arms and ankles. Systolic pressure is the pressure inside your arteries when your heart pumps. 3. Systolic pressure measurements will be taken several times, and at several points, on both the ankle and the arm. 4. Your health care provider will divide the highest systolic pressure of the ankle by the highest systolic pressure of the arm. The result is the ankle-brachial pressure ratio, or ABI. Sometimes this test will be repeated after you have exercised on a treadmill for 5 minutes. You may have leg pain during the exercise portion of the test if you suffer from PVD. If the index number drops after exercise, this may show that PVD is present. How are the results reported? Your test results will be reported as a value that shows the ratio of your ankle pressure to your arm pressure (ABI ratio). Your health care provider will compare your results to normal ranges that were established after testing a large group of people (reference ranges). Reference ranges may vary among labs and hospitals. For this test, a common reference range is:  ABI ratio of 0.9 to 1.3. What do the results mean? An ABI ratio that is below  the reference range is considered abnormal and may indicate PVD in the legs. Talk with your health care provider about what your results mean. Questions to ask your health care provider Ask your health care provider, or the department that is doing the test:  When will my results be ready?  How will I get my results?  What are my treatment options?  What other tests do I need?  What are my next steps? Summary  The ankle-brachial index (ABI) test is used to diagnose peripheral vascular disease (PVD). PVD is also known as peripheral arterial disease (PAD).  The ankle-brachial index  test measures the blood flow in your arms and legs.  The highest systolic pressure of the ankle is divided by the highest systolic pressure of the arm. The result is the ABI ratio.  An ABI ratio that is below 0.9 is considered abnormal and may indicate PVD in the legs. This information is not intended to replace advice given to you by your health care provider. Make sure you discuss any questions you have with your health care provider. Document Released: 03/11/2004 Document Revised: 11/29/2016 Document Reviewed: 11/29/2016 Elsevier Interactive Patient Education  [employer].

## 2018-04-12 NOTE — Progress Notes (Signed)
Cardiology Office Note   Date:  04/12/2018   ID:  Julie Prince, DOB 1960/06/10, MRN [id number]  PCP:  Mellody Dance, DO  Cardiologist:   Skeet Latch, MD   Chief Complaint  Patient presents with  . New Patient (Initial Visit)    Pt states no Sx.       History of Present Illness: Julie Prince is a 58 y.o. female  who is being seen today for the evaluation of tachycardia and edema at the request of Mellody Dance, DO.  She was seen in the ED 02/2018 with lower extremity edema.  In the ED she was also noted to be tachycardic.  She was started on carvedilol and instructed to follow up with cardiology.  She saw her primary care provider 04/11/2018 and was noted to be persistently tachycardic.  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  She was asymptomatic but referred to cardiology.  Thyroid testing was normal at that time.  She notes that her heart rate has been elevated when she sees the doctor since at least 2017.  In general she does not have any palpitations.  However, when she lies in bed at night she does feel her heart racing.  She has no chest pain or shortness of breath.  She has left lower extremity edema that improves with elevation of her leg and is worse at the end of the day.  She had lower extremity Dopplers that were negative for DVT.  Her left foot is very tender, though she denies any prior trauma.  Her foot has been bothering her for the last month.  Prior to that she was going to the gym for 1 hour daily and had no exertional symptoms.  She drinks 1 cup of coffee daily and has not been feeling particularly stressed or anxious.  She does not use any over-the-counter cold or cough medications regularly.  She states that her blood pressure at home is typically in the upper 120s.   Past Medical History:  Diagnosis Date  . Allergy   . Barrett esophagus   . Cataract    left cataract removal  . Essential hypertension, benign 09/12/2013  . GERD  (gastroesophageal reflux disease)   . Hyperlipidemia 09/12/2013  . 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 Type 2 diabetes mellitus ([place]) 09/12/2013   Dr. Posey Pronto at [place]     Past Surgical History:  Procedure Laterality Date  . APPENDECTOMY    . CATARACT EXTRACTION Left   . CESAREAN SECTION  1990  . COLONOSCOPY    . right tube and ovary removed    . WISDOM TOOTH EXTRACTION       Current Outpatient Medications  Medication Sig Dispense Refill  . amLODipine (NORVASC) 10 MG tablet Take 0.5 tablets (5 mg total) by mouth daily. 45 tablet 1  . aspirin 81 MG chewable tablet Chew by mouth daily.    Marland Kitchen atorvastatin (LIPITOR) 40 MG tablet Take 40 mg by mouth daily.  3  . insulin degludec (TRESIBA FLEXTOUCH) 100 UNIT/ML SOPN FlexTouch Pen Inject 50 units into skin daily at 10 pm.  PATIENT MUST HAVE OFFICE VISIT PRIOR TO ANY FURTHER REFILLS. 1 pen 0  . Insulin Pen Needle (B-D UF III MINI PEN NEEDLES) 31G X 5 MM MISC Patient to use to inject insulin. 100 each 11  . OZEMPIC, 1 MG/DOSE, 2 MG/1.5ML SOPN PLEASE SEE ATTACHED FOR DETAILED DIRECTIONS (Patient taking differently: Inject 1 mg into the skin once a week. )  3 pen 1  . metoprolol tartrate (LOPRESSOR) 50 MG tablet Take 1 tablet (50 mg total) by mouth 2 (two) times daily. 180 tablet 3   Current Facility-Administered Medications  Medication Dose Route Frequency Provider Last Rate Last Dose  . 0.9 %  sodium chloride infusion  500 mL Intravenous Once Pyrtle, Lajuan Lines, MD        Allergies:   Penicillins and Metformin and related    Social History:  The patient  reports that she has quit smoking. She smoked 0.50 packs per day. She has never used smokeless tobacco. She reports that she does not drink alcohol or use drugs.   Family History:  The patient's family history includes Alzheimer's disease in her maternal grandmother; Fibromyalgia in her sister; Heart disease in her maternal aunt; Lung cancer in her mother; Stroke in her  father.    ROS:  Please see the history of present illness.   Otherwise, review of systems are positive for none.   All other systems are reviewed and negative.    PHYSICAL EXAM: VS:  BP (!) 143/85 (BP Location: Right Arm)   Pulse (!) 128   Ht 5\' 4"  (1.626 m)   Wt 207 lb 3.2 oz (94 kg)   BMI 35.57 kg/m  , BMI Body mass index is 35.57 kg/m. GENERAL:  Well appearing HEENT:  Pupils equal round and reactive, fundi not visualized, oral mucosa unremarkable NECK:  No jugular venous distention, waveform within normal limits, carotid upstroke brisk and symmetric, no bruits LUNGS:  Clear to auscultation bilaterally HEART: Tachycardic.  Regular rhythm.Marland Kitchen  PMI not displaced or sustained,S1 and S2 within normal limits, no S3, no S4, no clicks, no rubs, no murmurs ABD:  Flat, positive bowel sounds normal in frequency in pitch, no bruits, no rebound, no guarding, no midline pulsatile mass, no hepatomegaly, no splenomegaly EXT:  1 plus pulses throughout, left lower extremity edema to the ankle, no cyanosis no clubbing SKIN:  No rashes no nodules NEURO:  Cranial nerves II through XII grossly intact, motor grossly intact throughout PSYCH:  Cognitively intact, oriented to person place and time   EKG:  EKG is not ordered today. The ekg ordered 03/17/2018 demonstrates sinus tachycardia.  Rate 132 bpm.  Prior inferior infarct.  Low voltage.   Recent Labs: 07/21/2017: ALT 9 03/17/2018: BUN 9; Creatinine, Ser 0.77; Hemoglobin 14.2; Platelets 292; Potassium 3.8; Sodium 141 04/11/2018: TSH 2.080    Lipid Panel    Component Value Date/Time   CHOL 127 07/21/2017 0829   TRIG 102 07/21/2017 0829   HDL 35 (L) 07/21/2017 0829   CHOLHDL 3.6 07/21/2017 0829   CHOLHDL 6.4 (H) 04/26/2016 1412   VLDL 32 (H) 04/26/2016 1412   LDLCALC 72 07/21/2017 0829      Wt Readings from Last 3 Encounters:  04/12/18 207 lb 3.2 oz (94 kg)  04/11/18 209 lb 3.2 oz (94.9 kg)  03/17/18 208 lb (94.3 kg)      ASSESSMENT  AND PLAN:  #Sinus tachycardia: No clear etiology.  Thyroid testing has been normal.  She is not particularly stressed or anxious and does not use heavy caffeine or over-the-counter cold or cough medications.  This is likely inappropriate sinus tachycardia.  Given the persistence of her symptoms we will get an echocardiogram to ensure she does not have any evidence of myocardial damage due to tachycardia.  We will switch carvedilol to metoprolol 50 mg twice daily.  #Hypertension: Blood pressure poorly-controlled today.  She reports that it  is been well-controlled at home.  Continue amlodipine and switch carvedilol to metoprolol as above.  #Hyperlipidemia: LDL was 72 07/2017.  # Abnormal EKG: EKG showed sinus tachycardia and possible inferior infarct.  She does have a history of diabetes.  However she is completely asymptomatic.  She is already on aspirin, atorvastatin, and beta-blocker.  There is little utility to pursuing ischemia evaluation at this time.  Continue medical management unless echo shows reduced LVEF.  # Claudication: Check ABIs.    Current medicines are reviewed at length with the patient today.  The patient does not have concerns regarding medicines.  The following changes have been made:  Switch carvedilol to metprolol  Labs/ tests ordered today include:   Orders Placed This Encounter  Procedures  . ECHOCARDIOGRAM COMPLETE     Disposition:   FU with Tomoki Lucken C. Oval Linsey, MD, [facility] in 1 month.     Signed, Shajuana Mclucas C. Oval Linsey, MD, [facility]  04/12/2018 3:42 PM    [facility]

## 2018-04-18 ENCOUNTER — Ambulatory Visit (HOSPITAL_COMMUNITY): Payer: BLUE CROSS/BLUE SHIELD | Attending: Cardiology

## 2018-04-18 DIAGNOSIS — R609 Edema, unspecified: Secondary | ICD-10-CM | POA: Diagnosis not present

## 2018-04-18 DIAGNOSIS — R000 Tachycardia, unspecified: Secondary | ICD-10-CM | POA: Diagnosis not present

## 2018-04-19 ENCOUNTER — Ambulatory Visit (INDEPENDENT_AMBULATORY_CARE_PROVIDER_SITE_OTHER): Payer: BLUE CROSS/BLUE SHIELD | Admitting: Family Medicine

## 2018-04-19 VITALS — BP 151/82 | Ht 64.0 in | Wt 205.0 lb

## 2018-04-19 DIAGNOSIS — M65342 Trigger finger, left ring finger: Secondary | ICD-10-CM

## 2018-04-19 DIAGNOSIS — M65341 Trigger finger, right ring finger: Secondary | ICD-10-CM

## 2018-04-19 DIAGNOSIS — M79672 Pain in left foot: Secondary | ICD-10-CM | POA: Diagnosis not present

## 2018-04-19 DIAGNOSIS — M6530 Trigger finger, unspecified finger: Secondary | ICD-10-CM

## 2018-04-19 MED ORDER — METHYLPREDNISOLONE ACETATE 40 MG/ML IJ SUSP
20.0000 mg | Freq: Once | INTRAMUSCULAR | Status: AC
  Administered 2018-04-19: 20 mg via INTRA_ARTICULAR

## 2018-04-19 NOTE — Patient Instructions (Signed)
You have trigger fingers of your ring fingers. We repeated the injections today. Follow up with me in 1 month. If you're still having problems would consider referral to hand surgery.  Your foot/ankle pain and swelling are suggestive of chronic venous insufficiency with mild peroneal tenosynovitis. The rest of your ultrasound was normal aside from mild ankle arthritis. I don't expect the peroneal tenosynovitis to cause the amount of swelling and pain you're having though. Arch supports are important. Icing 15 minutes at a time 3-4 times a day. Aleve 2 tabs twice a day with food for 7-10 days then as needed. Start theraband strengthening with yellow band only once a day - up to 3 sets of 10. Follow up with me in 1 month.

## 2018-04-20 ENCOUNTER — Encounter: Payer: Self-pay | Admitting: Family Medicine

## 2018-04-20 DIAGNOSIS — M65349 Trigger finger, unspecified ring finger: Secondary | ICD-10-CM | POA: Insufficient documentation

## 2018-04-20 DIAGNOSIS — M79672 Pain in left foot: Secondary | ICD-10-CM | POA: Insufficient documentation

## 2018-04-20 DIAGNOSIS — E08319 Diabetes mellitus due to underlying condition with unspecified diabetic retinopathy without macular edema: Secondary | ICD-10-CM | POA: Insufficient documentation

## 2018-04-20 MED ORDER — INSULIN PEN NEEDLE 31G X 5 MM MISC: 11 refills | Status: DC

## 2018-04-20 MED ORDER — INSULIN DEGLUDEC 100 UNIT/ML ~~LOC~~ SOPN: PEN_INJECTOR | SUBCUTANEOUS | 0 refills | Status: DC

## 2018-04-20 MED ORDER — SEMAGLUTIDE (1 MG/DOSE) 2 MG/1.5ML ~~LOC~~ SOPN: 1.0000 mg | PEN_INJECTOR | SUBCUTANEOUS | Status: DC

## 2018-04-20 NOTE — Progress Notes (Signed)
PCP: Mellody Dance, DO  Subjective:   HPI: Patient is a 58 y.o. female here for bilateral finger pain, left foot pain.  Patient reports for several months now she's had pain and catching of bilateral 4th digits. Gets stuck with bending at PIP joints on both. Associated pain into her palm. She had injections into these about 2 months ago with only transient relief. Has not tried anything else for this. Worse in morning and with a lot of activity. Pain 0/10 at rest, sharp with movement and when it gets stuck. Also with left foot pain and swelling with walking. No acute injury or trauma. This started about a month ago. Bothers primarily lateral foot up to heel. Has been elevating.  Had normal radiographs. No skin changes, numbness.  Past Medical History:  Diagnosis Date  . Allergy   . Barrett esophagus   . Cataract    left cataract removal  . Essential hypertension, benign 09/12/2013  . GERD (gastroesophageal reflux disease)   . Hyperlipidemia 09/12/2013  . 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 Type 2 diabetes mellitus ([place]) 09/12/2013   Dr. Posey Pronto at [place]     Current Outpatient Medications on File Prior to Visit  Medication Sig Dispense Refill  . aspirin 81 MG chewable tablet Chew by mouth daily.    . insulin degludec (TRESIBA FLEXTOUCH) 100 UNIT/ML SOPN FlexTouch Pen Inject 50 units into skin daily at 10 pm.  PATIENT MUST HAVE OFFICE VISIT PRIOR TO ANY FURTHER REFILLS. 1 pen 0  . Insulin Pen Needle (B-D UF III MINI PEN NEEDLES) 31G X 5 MM MISC Patient to use to inject insulin. 100 each 11  . OZEMPIC, 1 MG/DOSE, 2 MG/1.5ML SOPN PLEASE SEE ATTACHED FOR DETAILED DIRECTIONS (Patient taking differently: Inject 1 mg into the skin once a week. ) 3 pen 1  . amLODipine (NORVASC) 10 MG tablet Take 0.5 tablets (5 mg total) by mouth daily. (Patient not taking: Reported on 04/19/2018) 45 tablet 1  . atorvastatin (LIPITOR) 40 MG tablet Take 40 mg by mouth daily.  3   . metoprolol tartrate (LOPRESSOR) 50 MG tablet Take 1 tablet (50 mg total) by mouth 2 (two) times daily. (Patient not taking: Reported on 04/19/2018) 180 tablet 3   Current Facility-Administered Medications on File Prior to Visit  Medication Dose Route Frequency Provider Last Rate Last Dose  . 0.9 %  sodium chloride infusion  500 mL Intravenous Once Pyrtle, Lajuan Lines, MD        Past Surgical History:  Procedure Laterality Date  . APPENDECTOMY    . CATARACT EXTRACTION Left   . CESAREAN SECTION  1990  . COLONOSCOPY    . right tube and ovary removed    . WISDOM TOOTH EXTRACTION      Allergies  Allergen Reactions  . Penicillins Hives and Shortness Of Breath  . Metformin And Related Diarrhea    Social History   Socioeconomic History  . Marital status: Married    Spouse name: Not on file  . Number of children: Not on file  . Years of education: Not on file  . Highest education level: Not on file  Occupational History  . Not on file  Social Needs  . Financial resource strain: Not on file  . Food insecurity:    Worry: Not on file    Inability: Not on file  . Transportation needs:    Medical: Not on file    Non-medical: Not on file  Tobacco Use  .  Smoking status: Former Smoker    Packs/day: 0.50  . Smokeless tobacco: Never Used  . Tobacco comment: quit 30 plus years ago.  Substance and Sexual Activity  . Alcohol use: No    Alcohol/week: 0.0 standard drinks  . Drug use: No  . Sexual activity: Yes    Birth control/protection: None  Lifestyle  . Physical activity:    Days per week: Not on file    Minutes per session: Not on file  . Stress: Not on file  Relationships  . Social connections:    Talks on phone: Not on file    Gets together: Not on file    Attends religious service: Not on file    Active member of club or organization: Not on file    Attends meetings of clubs or organizations: Not on file    Relationship status: Not on file  . Intimate partner violence:     Fear of current or ex partner: Not on file    Emotionally abused: Not on file    Physically abused: Not on file    Forced sexual activity: Not on file  Other Topics Concern  . Not on file  Social History Narrative  . Not on file    Family History  Problem Relation Age of Onset  . Lung cancer Mother   . Stroke Father   . Heart disease Maternal Aunt   . Fibromyalgia Sister   . Alzheimer's disease Maternal Grandmother   . Colon cancer Neg Hx   . Esophageal cancer Neg Hx   . Rectal cancer Neg Hx   . Stomach cancer Neg Hx   . Pancreatic cancer Neg Hx     BP (!) 151/82   Ht 5\' 4"  (1.626 m)   Wt 205 lb (93 kg)   BMI 35.19 kg/m   Review of Systems: See HPI above.     Objective:  Physical Exam:  Gen: NAD, comfortable in exam room  Right hand: No deformity, swelling, bruising, malrotation or angulation. TTP over A1 pulley of 4th digit, less into volar 4th digit.  No other tenderness of wrist or hand. FROM digits including MCP, PIP, DIP of 4th digit. Collateral ligaments intact. Negative tinels, finkelsteins. NVI distally.  Left hand: No deformity, swelling, bruising, malrotation or angulation. TTP over A1 pulley of 4th digit, less into volar 4th digit.  No other tenderness of wrist or hand. FROM digits including MCP, PIP, DIP of 4th digit. Collateral ligaments intact. Negative tinels, finkelsteins. NVI distally.  Left foot/ankle: Mild diffuse swelling of ankle, dorsal foot.  No warmth, redness, other deformity. FROM with 5/5 strength and no pain. TTP minimally base 5th metatarsal to heel laterally.  No other tenderness of foot or ankle. Negative ant drawer and talar tilt.   Negative syndesmotic compression. Negative calcaneal squeeze. Thompsons test negative. NV intact distally.   MSK u/s left foot/ankle:  No ankle effusion.  No cortical irregularity of 4th, 5th metatarsals, lateral malleolus, talar dome.  Mild arthropathy of ankle joint.  Peroneal tendons  intact with only mild tenosynovitis of peroneus brevis.    Assessment & Plan:  1. Bilateral trigger fingers - discussed options an went ahead with repeat injections today.  F/u in 1 month.  If still having problems would refer to hand surgery.  After informed written consent patient timeout was performed and patient was seated in chair in exam room.  Area overlying right 4th A1 pulley prepped with alcohol swab then injected with 0.5:0.18mL bupivicaine:depomedrol.  Patient tolerated procedure well without immediate complications.  After informed written consent patient timeout was performed and patient was seated in chair in exam room.  Area overlying left 4th A1 pulley prepped with alcohol swab then injected with 0.5:0.7mL bupivicaine:depomedrol.  Patient tolerated procedure well without immediate complications.  2. Left foot/ankle pain - with swelling.  Her ultrasound is reassuring as well as exam - most pain is in nonanatomic location for orthopedic issue.  Her mild peroneal tenosynovitis is not expected to cause the amount of swelling she describes - discussed probability of underlying chronic venous insufficiency.  We will treat with arch supports, icing, aleve for 7-10 days then as needed, theraband strengthening.  F/u in 1 month.

## 2018-04-20 NOTE — Addendum Note (Signed)
Addended by: Marjory Sneddon on: 04/20/2018 06:27 PM   Modules accepted: Orders

## 2018-04-25 ENCOUNTER — Telehealth: Payer: Self-pay

## 2018-04-25 NOTE — Telephone Encounter (Signed)
Called and spoke to the patient to verify that her DM and insulin is being managed at this time by Dr. Buddy Duty.  Patient states that it is currently being managed by their office and they currently have her on Ozempic 1 mg weekly and 40 units of the Tresiba nightly.  Patient states that he glucose levels are running low between 52 and 102.  Patient states that recently if glucose reaches above that level she feels ill.  Patient also states that she does not take the tresiba at night if BS is running real low.  Patient states that she is currently having some nausea and vomiting since yesterday and slight dizziness, offered the patient an acute appointment for today with Dr. Raliegh Scarlet but the patient is not able to get a ride to the office today and does not want to drive while dizzy.  Patient states that BS last night was 126 and she had not eaten anything and fasting this morning level was 112.  Patient is pushing fluids and will follow up as needed.  Patient would like for our office to manage her DM and medications for that if possible as it would be easier for her to come to one provider since she lives in [place].  MPulliam, CMA/RT(R)

## 2018-05-09 ENCOUNTER — Ambulatory Visit (HOSPITAL_COMMUNITY)
Admission: RE | Admit: 2018-05-09 | Discharge: 2018-05-09 | Disposition: A | Discharge status: Home / Self Care | Payer: BLUE CROSS/BLUE SHIELD | Source: Ambulatory Visit | Attending: Internal Medicine | Admitting: Internal Medicine

## 2018-05-09 DIAGNOSIS — I739 Peripheral vascular disease, unspecified: Secondary | ICD-10-CM | POA: Insufficient documentation

## 2018-05-14 ENCOUNTER — Ambulatory Visit: Payer: BLUE CROSS/BLUE SHIELD | Admitting: Physician Assistant

## 2018-05-14 ENCOUNTER — Telehealth: Payer: Self-pay | Admitting: Cardiovascular Disease

## 2018-05-14 NOTE — Telephone Encounter (Signed)
Spoke with pt, aware of LEA results. Follow up scheduled with dr Fletcher Anon.

## 2018-05-14 NOTE — Telephone Encounter (Signed)
Follow Up:    Returning a call from Thursday, concerning her U/S results.

## 2018-05-17 ENCOUNTER — Ambulatory Visit: Payer: BLUE CROSS/BLUE SHIELD | Admitting: Family Medicine

## 2018-05-18 NOTE — Telephone Encounter (Signed)
Appointment made for 05/28/2018 for patient to discuss current glucose levels with Dr. Raliegh Scarlet. MPulliam, CMA/RT(R)

## 2018-05-22 ENCOUNTER — Encounter: Payer: Self-pay | Admitting: Cardiovascular Disease

## 2018-05-22 ENCOUNTER — Ambulatory Visit: Payer: BLUE CROSS/BLUE SHIELD | Admitting: Cardiovascular Disease

## 2018-05-22 VITALS — BP 130/90 | HR 128 | Ht 64.0 in | Wt 213.0 lb

## 2018-05-22 DIAGNOSIS — I739 Peripheral vascular disease, unspecified: Secondary | ICD-10-CM | POA: Diagnosis not present

## 2018-05-22 DIAGNOSIS — I10 Essential (primary) hypertension: Secondary | ICD-10-CM

## 2018-05-22 DIAGNOSIS — Z01818 Encounter for other preprocedural examination: Secondary | ICD-10-CM

## 2018-05-22 DIAGNOSIS — E7849 Other hyperlipidemia: Secondary | ICD-10-CM

## 2018-05-22 LAB — BASIC METABOLIC PANEL
BUN/Creatinine Ratio: 9 (ref 9–23)
BUN: 7 mg/dL (ref 6–24)
CO2: 26 mmol/L (ref 20–29)
Calcium: 9.4 mg/dL (ref 8.7–10.2)
Chloride: 102 mmol/L (ref 96–106)
Creatinine, Ser: 0.79 mg/dL (ref 0.57–1.00)
GFR calc non Af Amer: 83 mL/min/{1.73_m2} (ref >59)
GFR, EST AFRICAN AMERICAN: 96 mL/min/{1.73_m2} (ref >59)
Glucose: 80 mg/dL (ref 65–99)
Potassium: 4.5 mmol/L (ref 3.5–5.2)
Sodium: 142 mmol/L (ref 134–144)

## 2018-05-22 LAB — CBC
Hematocrit: 40.1 % (ref 34.0–46.6)
Hemoglobin: 13.7 g/dL (ref 11.1–15.9)
MCH: 32 pg (ref 26.6–33.0)
MCHC: 34.2 g/dL (ref 31.5–35.7)
MCV: 94 fL (ref 79–97)
PLATELETS: 293 10*3/uL (ref 150–450)
RBC: 4.28 x10E6/uL (ref 3.77–5.28)
RDW: 12.7 % (ref 11.7–15.4)
WBC: 6.6 10*3/uL (ref 3.4–10.8)

## 2018-05-22 NOTE — Patient Instructions (Addendum)
Medication Instructions:  No changes If you need a refill on your cardiac medications before your next appointment, please call your pharmacy.   Lab work: Your provider would like for you to have the following labs today: CBC and BMET  Follow-Up: At [facility], you and your health needs are our priority.  As part of our continuing mission to provide you with exceptional heart care, we have created designated Provider Care Teams.  These Care Teams include your primary Cardiologist (physician) and Advanced Practice Providers (APPs -  Physician Assistants and Nurse Practitioners) who all work together to provide you with the care you need, when you need it. . You will need a follow up appointment in 1 month.  Please call our office 2 months in advance to schedule this appointment.  You may see Dr. Fletcher Anon.  Any Other Special Instructions Will Be Listed Below (If Applicable).     [facility] [address] [place] [address] [place] [address] Dept: [phone] Loc: [place]  05/22/2018  You are scheduled for a Peripheral Angiogram on Wednesday, March 18 with Dr. [name].  1. Please arrive at the [facility] (Main Entrance A) at [facility]: [place], [place] [address] at 8:30 (This time is two hours before your procedure to ensure your preparation). Free valet parking service is available.   Special note: Every effort is made to have your procedure done on time. Please understand that emergencies sometimes delay scheduled procedures.  2. Diet: Do not eat solid foods after midnight.  The patient may have clear liquids until 5am upon the day of the procedure.  3. Labs: You will need to have blood drawn today 05/22/2018  4. Medication instructions in preparation for your procedure: Hold all diabetic medication the morning of the procedure.  -Take half the dose of the  Tresiba the night before the procedure.  On the morning of your procedure, take your Aspirin and any morning medicines NOT listed above.  You may use sips of water.  5. Plan for one night stay--bring personal belongings. 6. Bring a current list of your medications and current insurance cards. 7. You MUST have a responsible person to drive you home. 8. Someone MUST be with you the first 24 hours after you arrive home or your discharge will be delayed. 9. Please wear clothes that are easy to get on and off and wear slip-on shoes.  Thank you for allowing [name] to care for you!   -- [facility]

## 2018-05-22 NOTE — Progress Notes (Signed)
Cardiology Office Note   Date:  05/22/2018   ID:  Julie Prince, DOB [birth year], MRN [id number]  PCP:  Mellody Dance, DO  Cardiologist: Dr. Oval Linsey  Chief Complaint  Patient presents with  . Follow-up    pt c/o swelling and pain in her feet      History of Present Illness: Julie Prince is a 58 y.o. female who was referred by Dr. Oval Linsey for evaluation and management of peripheral arterial disease.  She was seen recently for lower extremity edema and tachycardia.  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  She was suspected of having inappropriate sinus tachycardia and was switched to metoprolol.  Echocardiogram was normal. She has known history of hypertension, hyperlipidemia and diabetes mellitus for at least 12 years which has not been well controlled in the past but improved significantly recently.  She is a previous smoker and quit more than 20 years ago. She reports prolonged symptoms of bilateral calf and foot pain that happens with minimal exertion after walking less than 50 feet.  She has to rest and stop for few minutes before she can resume.  Symptoms are worse on the left side than the right side.  Both feet are cold.  She usually wakes up at night with bilateral leg pain.  No lower extremity ulceration. There went vascular evaluation which showed moderately reduced ABI but reliable due to medial calcifications.  Duplex showed bilateral SFA occlusion with one-vessel runoff below the knee.     Past Medical History:  Diagnosis Date  . Allergy   . Barrett esophagus   . Cataract    left cataract removal  . Essential hypertension, benign 09/12/2013  . GERD (gastroesophageal reflux disease)   . Hyperlipidemia 09/12/2013  . 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 Type 2 diabetes mellitus ([place]) 09/12/2013   Dr. Posey Pronto at [place]     Past Surgical History:  Procedure Laterality Date  . APPENDECTOMY    . CATARACT EXTRACTION Left   . CESAREAN  SECTION  1990  . COLONOSCOPY    . right tube and ovary removed    . WISDOM TOOTH EXTRACTION       Current Outpatient Medications  Medication Sig Dispense Refill  . aspirin 81 MG chewable tablet Chew by mouth daily.    Marland Kitchen atorvastatin (LIPITOR) 40 MG tablet Take 40 mg by mouth daily.  3  . insulin degludec (TRESIBA FLEXTOUCH) 100 UNIT/ML SOPN FlexTouch Pen Inject 50 units into skin daily at 10 pm. ( Med mgt per Dr Buddy Duty of Endo ) 1 pen 0  . Insulin Pen Needle (B-D UF III MINI PEN NEEDLES) 31G X 5 MM MISC Patient to use to inject insulin. ( Med per Dr Buddy Duty of Endo ) 100 each 11  . metoprolol tartrate (LOPRESSOR) 50 MG tablet Take 1 tablet (50 mg total) by mouth 2 (two) times daily. 180 tablet 3  . Semaglutide, 1 MG/DOSE, (OZEMPIC, 1 MG/DOSE,) 2 MG/1.5ML SOPN Inject 1 mg into the skin once a week. ( Med Mgt per Dr Buddy Duty of Endo )     Current Facility-Administered Medications  Medication Dose Route Frequency Provider Last Rate Last Dose  . 0.9 %  sodium chloride infusion  500 mL Intravenous Once Pyrtle, Lajuan Lines, MD        Allergies:   Penicillins and Metformin and related    Social History:  The patient  reports that she has quit smoking. She smoked 0.50 packs per day. She has never  used smokeless tobacco. She reports that she does not drink alcohol or use drugs.   Family History:  The patient's family history includes Alzheimer's disease in her maternal grandmother; Fibromyalgia in her sister; Heart disease in her maternal aunt; Lung cancer in her mother; Stroke in her father.    ROS:  Please see the history of present illness.   Otherwise, review of systems are positive for none.   All other systems are reviewed and negative.    PHYSICAL EXAM: VS:  BP 130/90   Pulse (!) 128   Ht 5\' 4"  (1.626 m)   Wt 213 lb (96.6 kg)   SpO2 98%   BMI 36.56 kg/m  , BMI Body mass index is 36.56 kg/m. GEN: Well nourished, well developed, in no acute distress  HEENT: normal  Neck: no JVD, carotid  bruits, or masses Cardiac: RRR; no murmurs, rubs, or gallops,no edema  Respiratory:  clear to auscultation bilaterally, normal work of breathing GI: soft, nontender, nondistended, + BS MS: no deformity or atrophy  Skin: warm and dry, no rash Neuro:  Strength and sensation are intact Psych: euthymic mood, full affect Vascular studies: Radial pulses normal bilaterally.  Femoral pulses +1 bilaterally.  Distal pulses are not palpable.   EKG:  EKG is not ordered today.   Recent Labs: 07/21/2017: ALT 9 03/17/2018: BUN 9; Creatinine, Ser 0.77; Hemoglobin 14.2; Platelets 292; Potassium 3.8; Sodium 141 04/11/2018: TSH 2.080    Lipid Panel    Component Value Date/Time   CHOL 127 07/21/2017 0829   TRIG 102 07/21/2017 0829   HDL 35 (L) 07/21/2017 0829   CHOLHDL 3.6 07/21/2017 0829   CHOLHDL 6.4 (H) 04/26/2016 1412   VLDL 32 (H) 04/26/2016 1412   LDLCALC 72 07/21/2017 0829      Wt Readings from Last 3 Encounters:  05/22/18 213 lb (96.6 kg)  04/19/18 205 lb (93 kg)  04/12/18 207 lb 3.2 oz (94 kg)        PAD Screen 05/22/2018  Previous PAD dx? No  Previous surgical procedure? No  Pain with walking? Yes  Subsides with rest? No  Feet/toe relief with dangling? No  Painful, non-healing ulcers? No  Extremities discolored? No      ASSESSMENT AND PLAN:  1.  Peripheral arterial disease with severe bilateral leg claudication and rest pain (Rutherford class IV) with extensive PAD as outlined above.  I discussed with her the natural history and management of peripheral arterial disease. Given severity of her symptoms and rest pain, I recommend proceeding with abdominal aortogram with lower extremity runoff and possible endovascular intervention.  I discussed the procedure in details as well as risks and benefits.  2.  Essential hypertension: Blood pressure is reasonably controlled.  She has resting tachycardia that improved with metoprolol.  3.  Hyperlipidemia: Continue treatment with  atorvastatin with a target LDL of less than 70.  Most recent lipid profile showed an LDL of 72.  4.  Diabetes mellitus: Most recent hemoglobin A1c improved significantly to 5.8 from 10.1.    Disposition:   FU with me in 1 month  Signed,  [name], MD  05/22/2018 8:31 AM    [place]

## 2018-05-22 NOTE — H&P (View-Only) (Signed)
Cardiology Office Note   Date:  05/22/2018   ID:  Julie Prince, DOB Feb 25, 1961, MRN [id number]  PCP:  Mellody Dance, DO  Cardiologist: Dr. Oval Linsey  Chief Complaint  Patient presents with  . Follow-up    pt c/o swelling and pain in her feet      History of Present Illness: Julie Prince is a 58 y.o. female who was referred by Dr. Oval Linsey for evaluation and management of peripheral arterial disease.  She was seen recently for lower extremity edema and tachycardia.  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  She was suspected of having inappropriate sinus tachycardia and was switched to metoprolol.  Echocardiogram was normal. She has known history of hypertension, hyperlipidemia and diabetes mellitus for at least 12 years which has not been well controlled in the past but improved significantly recently.  She is a previous smoker and quit more than 20 years ago. She reports prolonged symptoms of bilateral calf and foot pain that happens with minimal exertion after walking less than 50 feet.  She has to rest and stop for few minutes before she can resume.  Symptoms are worse on the left side than the right side.  Both feet are cold.  She usually wakes up at night with bilateral leg pain.  No lower extremity ulceration. There went vascular evaluation which showed moderately reduced ABI but reliable due to medial calcifications.  Duplex showed bilateral SFA occlusion with one-vessel runoff below the knee.     Past Medical History:  Diagnosis Date  . Allergy   . Barrett esophagus   . Cataract    left cataract removal  . Essential hypertension, benign 09/12/2013  . GERD (gastroesophageal reflux disease)   . Hyperlipidemia 09/12/2013  . 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 Type 2 diabetes mellitus ([place]) 09/12/2013   Dr. Posey Pronto at [place]     Past Surgical History:  Procedure Laterality Date  . APPENDECTOMY    . CATARACT EXTRACTION Left   . CESAREAN  SECTION  1990  . COLONOSCOPY    . right tube and ovary removed    . WISDOM TOOTH EXTRACTION       Current Outpatient Medications  Medication Sig Dispense Refill  . aspirin 81 MG chewable tablet Chew by mouth daily.    Marland Kitchen atorvastatin (LIPITOR) 40 MG tablet Take 40 mg by mouth daily.  3  . insulin degludec (TRESIBA FLEXTOUCH) 100 UNIT/ML SOPN FlexTouch Pen Inject 50 units into skin daily at 10 pm. ( Med mgt per Dr Buddy Duty of Endo ) 1 pen 0  . Insulin Pen Needle (B-D UF III MINI PEN NEEDLES) 31G X 5 MM MISC Patient to use to inject insulin. ( Med per Dr Buddy Duty of Endo ) 100 each 11  . metoprolol tartrate (LOPRESSOR) 50 MG tablet Take 1 tablet (50 mg total) by mouth 2 (two) times daily. 180 tablet 3  . Semaglutide, 1 MG/DOSE, (OZEMPIC, 1 MG/DOSE,) 2 MG/1.5ML SOPN Inject 1 mg into the skin once a week. ( Med Mgt per Dr Buddy Duty of Endo )     Current Facility-Administered Medications  Medication Dose Route Frequency Provider Last Rate Last Dose  . 0.9 %  sodium chloride infusion  500 mL Intravenous Once Pyrtle, Lajuan Lines, MD        Allergies:   Penicillins and Metformin and related    Social History:  The patient  reports that she has quit smoking. She smoked 0.50 packs per day. She has never  used smokeless tobacco. She reports that she does not drink alcohol or use drugs.   Family History:  The patient's family history includes Alzheimer's disease in her maternal grandmother; Fibromyalgia in her sister; Heart disease in her maternal aunt; Lung cancer in her mother; Stroke in her father.    ROS:  Please see the history of present illness.   Otherwise, review of systems are positive for none.   All other systems are reviewed and negative.    PHYSICAL EXAM: VS:  BP 130/90   Pulse (!) 128   Ht 5\' 4"  (1.626 m)   Wt 213 lb (96.6 kg)   SpO2 98%   BMI 36.56 kg/m  , BMI Body mass index is 36.56 kg/m. GEN: Well nourished, well developed, in no acute distress  HEENT: normal  Neck: no JVD, carotid  bruits, or masses Cardiac: RRR; no murmurs, rubs, or gallops,no edema  Respiratory:  clear to auscultation bilaterally, normal work of breathing GI: soft, nontender, nondistended, + BS MS: no deformity or atrophy  Skin: warm and dry, no rash Neuro:  Strength and sensation are intact Psych: euthymic mood, full affect Vascular studies: Radial pulses normal bilaterally.  Femoral pulses +1 bilaterally.  Distal pulses are not palpable.   EKG:  EKG is not ordered today.   Recent Labs: 07/21/2017: ALT 9 03/17/2018: BUN 9; Creatinine, Ser 0.77; Hemoglobin 14.2; Platelets 292; Potassium 3.8; Sodium 141 04/11/2018: TSH 2.080    Lipid Panel    Component Value Date/Time   CHOL 127 07/21/2017 0829   TRIG 102 07/21/2017 0829   HDL 35 (L) 07/21/2017 0829   CHOLHDL 3.6 07/21/2017 0829   CHOLHDL 6.4 (H) 04/26/2016 1412   VLDL 32 (H) 04/26/2016 1412   LDLCALC 72 07/21/2017 0829      Wt Readings from Last 3 Encounters:  05/22/18 213 lb (96.6 kg)  04/19/18 205 lb (93 kg)  04/12/18 207 lb 3.2 oz (94 kg)        PAD Screen 05/22/2018  Previous PAD dx? No  Previous surgical procedure? No  Pain with walking? Yes  Subsides with rest? No  Feet/toe relief with dangling? No  Painful, non-healing ulcers? No  Extremities discolored? No      ASSESSMENT AND PLAN:  1.  Peripheral arterial disease with severe bilateral leg claudication and rest pain (Rutherford class IV) with extensive PAD as outlined above.  I discussed with her the natural history and management of peripheral arterial disease. Given severity of her symptoms and rest pain, I recommend proceeding with abdominal aortogram with lower extremity runoff and possible endovascular intervention.  I discussed the procedure in details as well as risks and benefits.  2.  Essential hypertension: Blood pressure is reasonably controlled.  She has resting tachycardia that improved with metoprolol.  3.  Hyperlipidemia: Continue treatment with  atorvastatin with a target LDL of less than 70.  Most recent lipid profile showed an LDL of 72.  4.  Diabetes mellitus: Most recent hemoglobin A1c improved significantly to 5.8 from 10.1.    Disposition:   FU with me in 1 month  Signed,  [name], MD  05/22/2018 8:31 AM    [place]

## 2018-05-23 ENCOUNTER — Telehealth: Payer: Self-pay | Admitting: Cardiovascular Disease

## 2018-05-23 NOTE — Telephone Encounter (Signed)
Patient's daughter has some questions regarding the procedure her mom is going to have.

## 2018-05-23 NOTE — Telephone Encounter (Signed)
Left a message for the patient to call back.  

## 2018-05-24 NOTE — Telephone Encounter (Signed)
The patient's questions were answered via MyChart advice request.

## 2018-05-28 ENCOUNTER — Encounter: Payer: Self-pay | Admitting: Family Medicine

## 2018-05-28 ENCOUNTER — Ambulatory Visit (INDEPENDENT_AMBULATORY_CARE_PROVIDER_SITE_OTHER): Payer: BLUE CROSS/BLUE SHIELD | Admitting: Family Medicine

## 2018-05-28 VITALS — BP 136/79 | HR 109 | Temp 98.1°F | Ht 64.0 in | Wt 222.0 lb

## 2018-05-28 DIAGNOSIS — N183 Chronic kidney disease, stage 3 (moderate): Secondary | ICD-10-CM | POA: Diagnosis not present

## 2018-05-28 DIAGNOSIS — E118 Type 2 diabetes mellitus with unspecified complications: Secondary | ICD-10-CM | POA: Diagnosis not present

## 2018-05-28 DIAGNOSIS — Z794 Long term (current) use of insulin: Secondary | ICD-10-CM

## 2018-05-28 DIAGNOSIS — E0822 Diabetes mellitus due to underlying condition with diabetic chronic kidney disease: Secondary | ICD-10-CM

## 2018-05-28 DIAGNOSIS — E1159 Type 2 diabetes mellitus with other circulatory complications: Secondary | ICD-10-CM

## 2018-05-28 DIAGNOSIS — E782 Mixed hyperlipidemia: Secondary | ICD-10-CM

## 2018-05-28 DIAGNOSIS — E1169 Type 2 diabetes mellitus with other specified complication: Secondary | ICD-10-CM | POA: Diagnosis not present

## 2018-05-28 DIAGNOSIS — I10 Essential (primary) hypertension: Secondary | ICD-10-CM

## 2018-05-28 DIAGNOSIS — I739 Peripheral vascular disease, unspecified: Secondary | ICD-10-CM

## 2018-05-28 LAB — POCT GLYCOSYLATED HEMOGLOBIN (HGB A1C): Hemoglobin A1C: 6 % — AB (ref 4.0–5.6)

## 2018-05-28 MED ORDER — SEMAGLUTIDE (1 MG/DOSE) 2 MG/1.5ML ~~LOC~~ SOPN: 1.0000 mg | PEN_INJECTOR | SUBCUTANEOUS | 1 refills | Status: DC

## 2018-05-28 NOTE — Progress Notes (Signed)
Impression and Recommendations:    1. (Currently) Controlled diabetes mellitus type 2 with complications, unspecified whether long term insulin use ([place])   2. Hypertension associated with diabetes ([place])   3. Diabetes mellitus due to underlying condition with stage 3 chronic kidney disease, without long-term current use of insulin ([place])   4. Mixed diabetic hyperlipidemia associated with type 2 diabetes mellitus ([place])   5. Type 2 diabetes mellitus with complication, with long-term current use of insulin ([place])   6. PAD (peripheral artery disease) ([name])-  See's Cards- Dr Elaina Pattee Primera/ that group for this condition     - Discussed need for patient to continue to obtain management and screenings with all established specialists.  Educated patient at length about the critical importance of keeping health maintenance up to date.  1. PAD - Followed by Cardiology, Dr. Oval Linsey - Patient is having evaluation on March 18th to fix her LE blockages.  - Advised patient to inform her specialist that her goal is to walk, to exercise, and not have pain.  - Will continue to monitor.  2. Currently Controlled Diabetes Mellitus, with long-term use of insulin - A1c today is at 6.0. - Reviewed goal blood sugars with patient today.  - Treatment plan adjusted today. - Discontinue Tresiba (insulin) today.  See med list.  - Per patient, took the Ozempic this morning. - Continue on 1 mg/dose of Ozempic weekly, as tolerated.  See med list. - Patient denies S-E on the Ozempic.  - Reviewed goal blood sugar readings with patient today.  - Counseled patient on pathophysiology of diabetes, and extensively discussed the need for prudent dietary and lifestyle modifications as first line of treatment.    - Resume exercise once LE concerns have been resolved.  - Discussed that keeping weight under control contributes to alleviation of diabetic symptoms.  - Critical importance of low carb, high protein diet  discussed with patient in addition to regular exercise.   - Check FBS and 2 hours after the biggest meal of your day.  Keep log and bring in next OV for my review.   Also, if you ever feel poorly, please check your blood pressure and blood sugar, as one or the other could be the cause of your symptoms.  - Patient understands the need for yearly diabetic foot and eye exams.  - Extensive education provided today and all questions were answered.  - Patient knows to follow up sooner if she has questions or concerns.  3. Hypertension Associated with Diabetes - Stable at this time. - Continue treatment plan as prescribed.  See med list. - Patient tolerating meds well without complication.  Denies S-E  - Will continue to monitor.  4. Hyperlipidemia Associated with Diabetes - Stable at this time. - Continue treatment plan as prescribed.  See med list. - Patient tolerating meds well without complication.  Denies S-E  - Will continue to monitor.  5. BMI Counseling - BMI of 38.11 - Explained to patient what BMI refers to, and what it means in relation to diabetes.  Told patient to think about it as a "medical risk stratification measurement" and how increasing BMI is associated with increasing risk/ or worsening state of various diseases such as hypertension, hyperlipidemia, diabetes, premature OA, depression etc.  - Advised patient to continue working toward exercising to improve overall mental, physical, and emotional health.    - Encouraged patient to install the LoseIt app to help track her nutritional intake.  - American  Heart Association guidelines for healthy diet, basically Mediterranean diet, and exercise guidelines of 30 minutes 5 days per week or more discussed in detail.  - Health counseling performed.  All questions answered.   Education and routine counseling performed. Handouts provided.   Medications Discontinued During This Encounter  Medication Reason  . insulin  degludec (TRESIBA FLEXTOUCH) 100 UNIT/ML SOPN FlexTouch Pen   . Insulin Pen Needle (B-D UF III MINI PEN NEEDLES) 31G X 5 MM MISC   . Semaglutide, 1 MG/DOSE, (OZEMPIC, 1 MG/DOSE,) 2 MG/1.5ML SOPN       Meds ordered this encounter  Medications  . Semaglutide, 1 MG/DOSE, (OZEMPIC, 1 MG/DOSE,) 2 MG/1.5ML SOPN    Sig: Inject 1 mg into the skin once a week.    Dispense:  18 pen    Refill:  1    The patient was counseled, risk factors were discussed, anticipatory guidance given.  Gross side effects, risk and benefits, and alternatives of medications discussed with patient.  Patient is aware that all medications have potential side effects and we are unable to predict every side effect or drug-drug interaction that may occur.  Expresses verbal understanding and consents to current therapy plan and treatment regimen.   Return for 2) 35mo- d/c insulin and on ozempic, wt loss, PAD.   Please see AVS handed out to patient at the end of our visit for further patient instructions/ counseling done pertaining to today's office visit.    Note:  This document was prepared using Dragon voice recognition software and may include unintentional dictation errors.   This document serves as a record of services personally performed by Mellody Dance, DO. It was created on her behalf by Toni Amend, a trained medical scribe. The creation of this record is based on the scribe's personal observations and the provider's statements to them.   I have reviewed the above medical documentation for accuracy and completeness and I concur.  Mellody Dance, DO 05/29/2018 7:47 PM       Subjective:    Chief Complaint  Patient presents with  . Follow-up    Julie Prince is a 58 y.o. female who presents to [place] [facility] today for Diabetes Management.    Has a blockage in her leg and notes she hasn't been exercising as much lately due to pain.  States "it hurts so bad."  Lower  Extremity Swelling Patient is experiencing swelling in her bilateral lower extremities, worse on left than right.  Notes her feet have been very painful lately.  She was recently told that she has two blockages in her left leg, and also has blockages in her right leg.  She didn't notice this swelling until last year.  Patient follows up with Dr. Skeet Latch of Cardiology.  She is having a procedure done for the blockage in her lower extremities around March 18th.  DM HPI: -  She has been working on diet and exercise for diabetes.  Confirms that she is trying to eat healthfully.  Pt is currently maintained on the following medications for diabetes:  see med list today Medication compliance - continues on medication as prescribed.  Feels her sugars have been going well.  Home glucose readings range - fasting sugars are running around 52-86 in the mornings.  In the evenings, she still has two big things full of Tresiba at night.  She still takes it, but not as prescribed, because it brings her blood sugar too low.  Reports  that last night she took 30 units, but last week she only took it twice.  She has not been taking the [place] daily.   Denies polyuria/polydipsia. Denies hypo/ hyperglycemia symptoms - She denies new onset of: chest pain, exercise intolerance, shortness of breath, dizziness, visual changes, headache, lower extremity swelling or claudication.   Last diabetic eye exam was  Lab Results  Component Value Date   HMDIABEYEEXA Retinopathy (A) 07/07/2017    Foot exam- UTD  Last A1C in the office was:  Lab Results  Component Value Date   HGBA1C 5.8 11/28/2017   HGBA1C 10.1 (H) 07/21/2017   HGBA1C 9.5 07/12/2017    Lab Results  Component Value Date   MICROALBUR 10 09/05/2017   LDLCALC 72 07/21/2017   CREATININE 0.79 05/22/2018     Last 3 blood pressure readings in our office are as follows: BP Readings from Last 3 Encounters:  05/29/18 (!) 148/93    05/28/18 136/79  05/22/18 130/90    BMI Readings from Last 3 Encounters:  05/29/18 37.80 kg/m  05/28/18 38.11 kg/m  05/22/18 36.56 kg/m     No problems updated.    Patient Care Team    Relationship Specialty Notifications Start End  Mellody Dance, DO PCP - General Family Medicine  07/12/17   Skeet Latch, MD PCP - Cardiology Cardiology Admissions 05/29/18   Pyrtle, Lajuan Lines, MD Consulting Physician Gastroenterology  07/13/17   Delrae Rend, MD Consulting Physician Endocrinology  07/13/17   Emily Filbert, MD Consulting Physician Obstetrics and Gynecology  07/13/17   Christella Hartigan, RD Dietitian Dietician  09/05/17      Patient Active Problem List   Diagnosis Date Noted  . PAD (peripheral artery disease) [facility])-  See's Cards- Dr Elaina Pattee Eschbach/ that group for this condition 05/28/2018    Priority: High  . Diabetic retinopathy associated with diabetes mellitus due to underlying condition ([place]) 04/20/2018    Priority: High  . Diabetes mellitus due to underlying condition with stage 3 chronic kidney disease, without long-term current use of insulin ([place]) 07/12/2017    Priority: High  . Mixed diabetic hyperlipidemia associated with type 2 diabetes mellitus ([place]) 07/12/2017    Priority: High  . Hypertension associated with diabetes ([place]) 07/12/2017    Priority: High  . Obesity, Class III, morbid obesity  ([place]) 07/12/2017    Priority: High  . Diabetic peripheral neuropathy associated with type 2 diabetes mellitus ([place]) 09/12/2013    Priority: High  . Foot pain, left 04/20/2018  . Trigger ring finger 04/20/2018  . 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 H/O medication noncompliance 07/12/2017  . Barrett's esophagus 03/11/2016  . 1st degree AV block 03/01/2016  . Tachycardia with hypertension 03/01/2016  . Dysphagia 02/10/2016  . Bloating 02/10/2016  . Constipation 02/10/2016  . Vulvar abscess   . Abscess 06/21/2015  . Sepsis ([place]) 06/21/2015  . GERD  (gastroesophageal reflux disease) 07/31/2014  . Adenomatous polyp of colon 04/03/2014  . Allergic rhinitis 01/23/2014  . CTS (carpal tunnel syndrome) 10/03/2013  . Uncontrolled type 2 diabetes mellitus with hyperglycemia ([place]) 10/03/2013  . Low back pain 10/03/2013  . Lumbar arthropathy 10/03/2013  . Lumbar degenerative disc disease 10/03/2013  . Neuropathy 10/03/2013  . Osteoarthritis 10/03/2013  . 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 Tenosynovitis of foot 10/03/2013  . Type 2 diabetes mellitus with complication, with long-term current use of insulin ([place]) 09/12/2013  . Hyperlipidemia 09/12/2013  . Essential hypertension, benign 09/12/2013     Past Medical History:  Diagnosis Date  . Allergy   . Barrett esophagus   . Cataract    left cataract removal  . Essential hypertension, benign 09/12/2013  . GERD (gastroesophageal reflux disease)   . Hyperlipidemia 09/12/2013  . 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 Type 2 diabetes mellitus ([place]) 09/12/2013   Dr. Posey Pronto at [place]      Past Surgical History:  Procedure Laterality Date  . APPENDECTOMY    . CATARACT EXTRACTION Left   . CESAREAN SECTION  1990  . COLONOSCOPY    . right tube and ovary removed    . WISDOM TOOTH EXTRACTION       Family History  Problem Relation Age of Onset  . Lung cancer Mother   . Stroke Father   . Heart disease Maternal Aunt   . Fibromyalgia Sister   . Alzheimer's disease Maternal Grandmother   . Colon cancer Neg Hx   . Esophageal cancer Neg Hx   . Rectal cancer Neg Hx   . Stomach cancer Neg Hx   . Pancreatic cancer Neg Hx      Social History   Substance and Sexual Activity  Drug Use No  ,  Social History   Substance and Sexual Activity  Alcohol Use No  . Alcohol/week: 0.0 standard drinks  ,  Social History   Tobacco Use  Smoking Status Former Smoker  . Packs/day: 0.50  Smokeless Tobacco Never Used  Tobacco Comment   quit 30 plus years ago.  ,     Current Outpatient Medications on File Prior to Visit  Medication Sig Dispense Refill  . aspirin 81 MG chewable tablet Chew by mouth daily.    Marland Kitchen atorvastatin (LIPITOR) 40 MG tablet Take 40 mg by mouth daily.  3   Current Facility-Administered Medications on File Prior to Visit  Medication Dose Route Frequency Provider Last Rate Last Dose  . 0.9 %  sodium chloride infusion  500 mL Intravenous Once Pyrtle, Lajuan Lines, MD         Allergies  Allergen Reactions  . Penicillins Hives and Shortness Of Breath  . Metformin And Related Diarrhea     Review of Systems:   General:  Denies fever, chills Optho/Auditory:   Denies visual changes, blurred vision Respiratory:   Denies SOB, cough, wheeze, DIB  Cardiovascular:   Denies chest pain, palpitations, painful respirations Gastrointestinal:   Denies nausea, vomiting, diarrhea.  Endocrine:     Denies new hot or cold intolerance Musculoskeletal:  Denies joint swelling, gait issues, or new unexplained myalgias/ arthralgias Skin:  Denies rash, suspicious lesions  Neurological:    Denies dizziness, unexplained weakness, numbness  Psychiatric/Behavioral:   Denies mood changes    Objective:     Blood pressure 136/79, pulse (!) 109, temperature 98.1 F (36.7 C), height 5\' 4"  (1.626 m), weight 222 lb (100.7 kg), SpO2 98 %.  Body mass index is 38.11 kg/m.  General: Well Developed, well nourished, and in no acute distress.  HEENT: Normocephalic, atraumatic, pupils equal round reactive to light, neck supple, No carotid bruits, no JVD Skin: Warm and dry, cap RF less 2 sec Cardiac: Regular rate and rhythm, S1, S2 WNL's, no murmurs rubs or gallops Respiratory: ECTA B/L, Not using accessory muscles, speaking in full sentences. NeuroM-Sk: Ambulates w/o assistance, moves ext * 4 w/o difficulty, sensation grossly intact.  Ext:  pitting edema b/l lower ext Psych: No HI/SI, judgement and insight good, Euthymic mood. Full Affect.

## 2018-05-28 NOTE — Patient Instructions (Addendum)
Please look into installing and using the [employer] nutrition tracking app.    This app will help you to keep track of everything you are eating, and assist with weight loss.  - off tresiba---> monitor BS closely as we discussed and bring in BS log next OV in 3 mo.  Call sooner if concerns or poorly controlled BS etc.      Behavior Modification Ideas for Weight Management  Weight management involves adopting a healthy lifestyle that includes a knowledge of nutrition and exercise, a positive attitude and the right kind of motivation. Internal motives such as better health, increased energy, self-esteem and personal control increase your chances of lifelong weight management success.  Remember to have realistic goals and think long-term success. Believe in yourself and you can do it. The following information will give you ideas to help you meet your goals.  Control Your Home Environment  Eat only while sitting down at the kitchen or dining room table. Do not eat while watching television, reading, cooking, talking on the phone, standing at the refrigerator or working on the computer. Keep tempting foods out of the house -- don't buy them. Keep tempting foods out of sight. Have low-calorie foods ready to eat. Unless you are preparing a meal, stay out of the kitchen. Have healthy snacks at your disposal, such as small pieces of fruit, vegetables, canned fruit, pretzels, low-fat string cheese and nonfat cottage cheese.  Control Your Work Environment  Do not eat at [employer] or keep tempting snacks at your desk. If you get hungry between meals, plan healthy snacks and bring them with you to work. During your breaks, go for a walk instead of eating. If you work around food, plan in advance the one item you will eat at mealtime. Make it inconvenient to nibble on food by chewing gum, sugarless candy or drinking water or another low-calorie beverage. Do not work through meals. Skipping meals slows  down metabolism and may result in overeating at the next meal. If food is available for special occasions, either pick the healthiest item, nibble on low-fat snacks brought from home, don't have anything offered, choose one option and have a small amount, or have only a beverage.  Control Your Mealtime Environment  Serve your plate of food at the stove or kitchen counter. Do not put the serving dishes on the table. If you do put dishes on the table, remove them immediately when finished eating. Fill half of your plate with vegetables, a quarter with lean protein and a quarter with starch. Use smaller plates, bowls and glasses. A smaller portion will look large when it is in a little dish. Politely refuse second helpings. When fixing your plate, limit portions of food to one scoop/serving or less.   Daily Food Management  Replace eating with another activity that you will not associate with food. Wait 20 minutes before eating something you are craving. Drink a large glass of water or diet soda before eating. Always have a big glass or bottle of water to drink throughout the day. Avoid high-calorie add-ons such as cream with your coffee, butter, mayonnaise and salad dressings.  Shopping: Do not shop when hungry or tired. Shop from a list and avoid buying anything that is not on your list. If you must have tempting foods, buy individual-sized packages and try to find a lower-calorie alternative. Don't taste test in the store. Read food labels. Compare products to help you make the healthiest choices.  Preparation: Sarina Ser  a piece of gum while cooking meals. Use a quarter teaspoon if you taste test your food. Try to only fix what you are going to eat, leaving yourself no chance for seconds. If you have prepared more food than you need, portion it into individual containers and freeze or refrigerate immediately. Don't snack while cooking meals.  Eating: Eat slowly. Remember it takes about  20 minutes for your stomach to send a message to your brain that it is full. Don't let fake hunger make you think you need more. The ideal way to eat is to take a bite, put your utensil down, take a sip of water, cut your next bite, take a bit, put your utensil down and so on. Do not cut your food all at one time. Cut only as needed. Take small bites and chew your food well. Stop eating for a minute or two at least once during a meal or snack. Take breaks to reflect and have conversation.  Cleanup and Leftovers: Label leftovers for a specific meal or snack. Freeze or refrigerate individual portions of leftovers. Do not clean up if you are still hungry.  Eating Out and Social Eating  Do not arrive hungry. Eat something light before the meal. Try to fill up on low-calorie foods, such as vegetables and fruit, and eat smaller portions of the high-calorie foods. Eat foods that you like, but choose small portions. If you want seconds, wait at least 20 minutes after you have eaten to see if you are actually hungry or if your eyes are bigger than your stomach. Limit alcoholic beverages. Try a soda water with a twist of lime. Do not skip other meals in the day to save room for the special event.  At Restaurants: Order  la carte rather than buffet style. Order some vegetables or a salad for an appetizer instead of eating bread. If you order a high-calorie dish, share it with someone. Try an after-dinner mint with your coffee. If you do have dessert, share it with two or more people. Don't overeat because you do not want to waste food. Ask for a doggie bag to take extra food home. Tell the server to put half of your entree in a to go bag before the meal is served to you. Ask for salad dressing, gravy or high-fat sauces on the side. Dip the tip of your fork in the dressing before each bite. If bread is served, ask for only one piece. Try it plain without butter or oil. At [employer] where  oil and vinegar is served with bread, use only a small amount of oil and a lot of vinegar for dipping.  At a [facility]: Offer to bring a dish, appetizer or dessert that is low in calories. Serve yourself small portions or tell the host that you only want a small amount. Stand or sit away from the snack table. Stay away from the kitchen or stay busy if you are near the food. Limit your alcohol intake.  At [employer] and Cafeterias: Cover most of your plate with lettuce and/or vegetables. Use a salad plate instead of a dinner plate. After eating, clear away your dishes before having coffee or tea.  Entertaining at Home: Explore low-fat, low-cholesterol cookbooks. Use single-serving foods like chicken breasts or hamburger patties. Prepare low-calorie appetizers and desserts.   Holidays: Keep tempting foods out of sight. Decorate the house without using food. Have low-calorie beverages and foods on hand for guests. Allow yourself one planned  treat a day. Don't skip meals to save up for the holiday feast. Eat regular, planned meals.   Exercise Well  Make exercise a priority and a planned activity in the day. If possible, walk the entire or part of the distance to work. Get an exercise buddy. Go for a walk with a colleague during one of your breaks, go to the gym, run or take a walk with a friend, walk in the mall with a shopping companion. Park at the end of the parking lot and walk to the store or office entrance. Always take the stairs all of the way or at least part of the way to your floor. If you have a desk job, walk around the office frequently. Do leg lifts while sitting at your desk. Do something outside on the weekends like going for a hike or a bike ride.   Have a Healthy Attitude  Make health your weight management priority. Be realistic. Have a goal to achieve a healthier you, not necessarily the lowest weight or ideal weight based on calculations or tables. Focus  on a healthy eating style, not on dieting. Dieting usually lasts for a short amount of time and rarely produces long-term success. Think long term. You are developing new healthy behaviors to follow next month, in a year and in a decade.    This information is for educational purposes only and is not intended to replace the advice of your doctor or health care provider. We encourage you to discuss with your doctor any questions or concerns you may have.        Guidelines for Losing Weight   We want weight loss that will last so you should lose 1-2 pounds a week.  THAT IS IT! Please pick THREE things a month to change. Once it is a habit check off the item. Then pick another three items off the list to become habits.  If you are already doing a habit on the list GREAT!  Cross that item off!  Dont drink your calories. Ie, alcohol, soda, fruit juice, and sweet tea.   Drink more water. Drink a glass when you feel hungry or before each meal.   Eat breakfast - Complex carb and protein (likeDannon light and fit yogurt, oatmeal, fruit, eggs, [place] bacon).  Measure your cereal.  Eat no more than one cup a day. (ie Kashi)  Eat an apple a day.  Add a vegetable a day.  Try a new vegetable a month.  Use Pam! Stop using oil or butter to cook.  Dont finish your plate or use smaller plates.  Share your dessert.  Eat sugar free Jello for dessert or frozen grapes.  Dont eat 2-3 hours before bed.  Switch to whole wheat bread, pasta, and brown rice.  Make healthier choices when you eat out. No fries!  Pick baked chicken, NOT fried.  Dont forget to SLOW DOWN when you eat. It is not going anywhere.   Take the stairs.  Park far away in the parking lot  Lift soup cans (or weights) for 10 minutes while watching TV.  Walk at work for 10 minutes during break.  Walk outside 1 time a week with your friend, kids, dog, or significant other.  Start a walking group at church.  Walk  the mall as much as you can tolerate.   Keep a food diary.  Weigh yourself daily.  Walk for 15 minutes 3 days per week.  Cook at home more often  and eat out less. If life happens and you go back to old habits, it is okay.  Just start over. You can do it!  If you experience chest pain, get short of breath, or tired during the exercise, please stop immediately and inform your doctor.    Before you even begin to attack a weight-loss plan, it pays to remember this: You are not fat. You have fat. Losing weight isn't about blame or shame; it's simply another achievement to accomplish. Dieting is like any other skill--you have to buckle down and work at it. As long as you act in a smart, reasonable way, you'll ultimately get where you want to be. Here are some weight loss pearls for you.   1. It's Not a Diet. It's a Lifestyle Thinking of a diet as something you're on and suffering through only for the short term doesn't work. To shed weight and keep it off, you need to make permanent changes to the way you eat. It's OK to indulge occasionally, of course, but if you cut calories temporarily and then revert to your old way of eating, you'll gain back the weight quicker than you can say yo-yo. Use it to lose it. Research shows that one of the best predictors of long-term weight loss is how many pounds you drop in the first month. For that reason, nutritionists often suggest being stricter for the first two weeks of your new eating strategy to build momentum. Cut out added sugar and alcohol and avoid unrefined carbs. After that, figure out how you can reincorporate them in a way that's healthy and maintainable.  2. There's a Right Way to Exercise Working out burns calories and fat and boosts your metabolism by building muscle. But those trying to lose weight are notorious for overestimating the number of calories they burn and underestimating the amount they take in. Unfortunately, your system is  biologically programmed to hold on to extra pounds and that means when you start exercising, your body senses the deficit and ramps up its hunger signals. If you're not diligent, you'll eat everything you burn and then some. Use it, to lose it. Cardio gets all the exercise glory, but strength and interval training are the real heroes. They help you build lean muscle, which in turn increases your metabolism and calorie-burning ability 3. Don't Overreact to Mild Hunger Some people have a hard time losing weight because of hunger anxiety. To them, being hungry is bad--something to be avoided at all costs--so they carry snacks with them and eat when they don't need to. Others eat because they're stressed out or bored. While you never want to get to the point of being ravenous (that's when bingeing is likely to happen), a hunger pang, a craving, or the fact that it's 3:00 p.m. should not send you racing for the vending machine or obsessing about the energy bar in your purse. Ideally, you should put off eating until your stomach is growling and it's difficult to concentrate.  Use it to lose it. When you feel the urge to eat, use the HALT method. Ask yourself, Am I really hungry? Or am I angry or anxious, lonely or bored, or tired? If you're still not certain, try the apple test. If you're truly hungry, an apple should seem delicious; if it doesn't, something else is going on. Or you can try drinking water and making yourself busy, if you are still hungry try a healthy snack.  4. Not All Calories Are Created  Equal The mechanics of weight loss are pretty simple: Take in fewer calories than you use for energy. But the kind of food you eat makes all the difference. Processed food that's high in saturated fat and refined starch or sugar can cause inflammation that disrupts the hormone signals that tell your brain you're full. The result: You eat a lot more.  Use it to lose it. Clean up your diet. Swap in whole,  unprocessed foods, including vegetables, lean protein, and healthy fats that will fill you up and give you the biggest nutritional bang for your calorie buck. In a few weeks, as your brain starts receiving regular hunger and fullness signals once again, you'll notice that you feel less hungry overall and naturally start cutting back on the amount you eat.  5. Protein, Produce, and Plant-Based Fats Are Your Weight-Loss Trinity Here's why eating the three Ps regularly will help you drop pounds. Protein fills you up. You need it to build lean muscle, which keeps your metabolism humming so that you can torch more fat. People in a weight-loss program who ate double the recommended daily allowance for protein (about 110 grams for a 150-pound woman) lost 70 percent of their weight from fat, while people who ate the RDA lost only about 40 percent, one study found. Produce is packed with filling fiber. "It's very difficult to consume too many calories if you're eating a lot of vegetables. Example: Three cups of broccoli is a lot of food, yet only 93 calories. (Fruit is another story. It can be easy to overeat and can contain a lot of calories from sugar, so be sure to monitor your intake.) Plant-based fats like olive oil and those in avocados and nuts are healthy and extra satiating.  Use it to lose it. Aim to incorporate each of the three Ps into every meal and snack. People who eat protein throughout the day are able to keep weight off, according to a study in the [place] of Clinical Nutrition. In addition to meat, poultry and seafood, good sources are beans, lentils, eggs, tofu, and yogurt. As for fat, keep portion sizes in check by measuring out salad dressing, oil, and nut butters (shoot for one to two tablespoons). Finally, eat veggies or a little fruit at every meal. People who did that consumed 308 fewer calories but didn't feel any hungrier than when they didn't eat more produce.  7. How You Eat Is  As Important As What You Eat In order for your brain to register that you're full, you need to focus on what you're eating. Sit down whenever you eat, preferably at a table. Turn off the TV or computer, put down your phone, and look at your food. Smell it. Chew slowly, and don't put another bite on your fork until you swallow. When women ate lunch this attentively, they consumed 30 percent less when snacking later than those who listened to an audiobook at lunchtime, according to a study in the [place] of Nutrition. 8. Weighing Yourself Really Works The scale provides the best evidence about whether your efforts are paying off. Seeing the numbers tick up or down or stagnate is motivation to keep going--or to rethink your approach. A 2015 study at [facility] found that daily weigh-ins helped people lose more weight, keep it off, and maintain that loss, even after two years. Use it to lose it. Step on the scale at the same time every day for the best results. If your weight  shoots up several pounds from one weigh-in to the next, don't freak out. Eating a lot of salt the night before or having your period is the likely culprit. The number should return to normal in a day or two. It's a steady climb that you need to do something about. 9. Too Much Stress and Too Little Sleep Are Your Enemies When you're tired and frazzled, your body cranks up the production of cortisol, the stress hormone that can cause carb cravings. Not getting enough sleep also boosts your levels of ghrelin, a hormone associated with hunger, while suppressing leptin, a hormone that signals fullness and satiety. People on a diet who slept only five and a half hours a night for two weeks lost 55 percent less fat and were hungrier than those who slept eight and a half hours, according to a study in the [place]. Use it to lose it. Prioritize sleep, aiming for seven hours or more a night, which research  shows helps lower stress. And make sure you're getting quality zzz's. If a snoring spouse or a fidgety cat wakes you up frequently throughout the night, you may end up getting the equivalent of just four hours of sleep, according to a study from [facility]. Keep pets out of the bedroom, and use a white-noise app to drown out snoring. 10. You Will Hit a plateau--And You Can Bust Through It As you slim down, your body releases much less leptin, the fullness hormone.  If you're not strength training, start right now. Building muscle can raise your metabolism to help you overcome a plateau. To keep your body challenged and burning calories, incorporate new moves and more intense intervals into your workouts or add another sweat session to your weekly routine. Alternatively, cut an extra 100 calories or so a day from your diet. Now that you've lost weight, your body simply doesn't need as much fuel.    Since food equals calories, in order to lose weight you must either eat fewer calories, exercise more to burn off calories with activity, or both. Food that is not used to fuel the body is stored as fat. A major component of losing weight is to make smarter food choices. Here's how:  1)   Limit non-nutritious foods, such as: Sugar, honey, syrups and candy Pastries, donuts, pies, cakes and cookies Soft drinks, sweetened juices and alcoholic beverages  2)  Cut down on high-fat foods by: - Choosing poultry, fish or lean red meat - Choosing low-fat cooking methods, such as baking, broiling, steaming, grilling and boiling - Using low-fat or non-fat dairy products - Using vinaigrette, herbs, lemon or fat-free salad dressings - Avoiding fatty meats, such as bacon, sausage, franks, ribs and luncheon meats - Avoiding high-fat snacks like nuts, chips and chocolate - Avoiding fried foods - Using less butter, margarine, oil and mayonnaise - Avoiding high-fat gravies, cream sauces and cream-based  soups  3) Eat a variety of foods, including: - Fruit and vegetables that are raw, steamed or baked - Whole grains, breads, cereal, rice and pasta - Dairy products, such as low-fat or non-fat milk or yogurt, low-fat cottage cheese and low-fat cheese - Protein-rich foods like chicken, [place], fish, lean meat and legumes, or beans  4) Change your eating habits by: - Eat three balanced meals a day to help control your hunger - Watch portion sizes and eat small servings of a variety of foods - Choose low-calorie snacks - Eat only when you are  hungry and stop when you are satisfied - Eat slowly and try not to perform other tasks while eating - Find other activities to distract you from food, such as walking, taking up a hobby or being involved in the community - Include regular exercise in your daily routine ( minimum of 20 min of moderate-intensity exercise at least 5 days/week)  - Find a support group, if necessary, for emotional support in your weight loss journey           Easy ways to cut 100 calories   1. Eat your eggs with hot sauce OR salsa instead of cheese.  Eggs are great for breakfast, but many people consider eggs and cheese to be BFFs. Instead of cheese--1 oz. of cheddar has 114 calories--top your eggs with hot sauce, which contains no calories and helps with satiety and metabolism. Salsa is also a great option!!  2. Top your toast, waffles or pancakes with fresh berries instead of jelly or syrup. Half a cup of berries--fresh, frozen or thawed--has about 40 calories, compared with 2 tbsp. of maple syrup or jelly, which both have about 100 calories. The berries will also give you a good punch of fiber, which helps keep you full and satisfied and wont spike blood sugar quickly like the jelly or syrup. 3. Swap the non-fat latte for black coffee with a splash of half-and-half. Contrary to its name, that non-fat latte has 130 calories and a startling 19g of carbohydrates per 16  oz. serving. Replacing that light drinkable dessert with a black coffee with a splash of half-and-half saves you more than 100 calories per 16 oz. serving. 4. Sprinkle salads with freeze-dried raspberries instead of dried cranberries. If you want a sweet addition to your nutritious salad, stay away from dried cranberries. They have a whopping 130 calories per  cup and 30g carbohydrates. Instead, sprinkle freeze-dried raspberries guilt-free and save more than 100 calories per  cup serving, adding 3g of belly-filling fiber. 5. Go for mustard in place of mayo on your sandwich. Mustard can add really nice flavor to any sandwich, and there are tons of varieties, from spicy to honey. A serving of mayo is 95 calories, versus 10 calories in a serving of mustard.  Or try an avocado mayo spread: You can find the recipe few click this link: https://www.californiaavocado.com/recipes/recipe-container/[place]-avocado-mayo 6. Choose a DIY salad dressing instead of the store-bought kind. Mix Dijon or whole grain mustard with low-fat Kefir or red wine vinegar and garlic. 7. Use hummus as a spread instead of a dip. Use hummus as a spread on a high-fiber cracker or tortilla with a sandwich and save on calories without sacrificing taste. 8. Pick just one salad accessory. Salad isnt automatically a calorie winner. Its easy to over-accessorize with toppings. Instead of topping your salad with nuts, avocado and cranberries (all three will clock in at 313 calories), just pick one. The next day, choose a different accessory, which will also keep your salad interesting. You dont wear all your jewelry every day, right? 9. Ditch the white pasta in favor of spaghetti squash. One cup of cooked spaghetti squash has about 40 calories, compared with traditional spaghetti, which comes with more than 200. Spaghetti squash is also nutrient-dense. Its a good source of fiber and Vitamins A and C, and it can be eaten just like  you would eat pasta--with a great tomato sauce and [place] meatballs or with pesto, tofu and spinach, for example. 10. Dress up your chili, soups and  stews with non-fat [place] yogurt instead of sour cream. Just a dollop of sour cream can set you back 115 calories and a whopping 12g of fat--seven of which are of the artery-clogging variety. Added bonus: [place] yogurt is packed with muscle-building protein, calcium and B Vitamins. 11. Mash cauliflower instead of mashed potatoes. One cup of traditional mashed potatoes--in all their creamy goodness--has more than 200 calories, compared to mashed cauliflower, which you can typically eat for less than 100 calories per 1 cup serving. Cauliflower is a great source of the antioxidant indole-3-carbinol (I3C), which may help reduce the risk of some cancers, like breast cancer. 12. Ditch the ice cream sundae in favor of a [place] yogurt parfait. Instead of a cup of ice cream or fro-yo for dessert, try 1 cup of nonfat Greek yogurt topped with fresh berries and a sprinkle of cacao nibs. Both toppings are packed with antioxidants, which can help reduce cellular inflammation and oxidative damage. And the comparison is a no-brainer: One cup of ice cream has about 275 calories; one cup of frozen yogurt has about 230; and a cup of Greek yogurt has just 130, plus twice the protein, so youre less likely to return to the freezer for a second helping. 13. Put olive oil in a spray container instead of using it directly from the bottle. Each tablespoon of olive oil is 120 calories and 15g of fat. Use a mister instead of pouring it straight into the pan or onto a salad. This allows for portion control and will save you more than 100 calories. 14. When baking, substitute canned pumpkin for butter or oil. Canned pumpkin--not pumpkin pie mix--is loaded with Vitamin A, which is important for skin and eye health, as well as immunity. And the comparisons are pretty crazy:  cup of canned  pumpkin has about 40 calories, compared to butter or oil, which has more than 800 calories. Yes, 800 calories. Applesauce and mashed banana can also serve as good substitutions for butter or oil, usually in a 1:1 ratio. 15. Top casseroles with high-fiber cereal instead of breadcrumbs. Breadcrumbs are typically made with white bread, while breakfast cereals contain 5-9g of fiber per serving. Not only will you save more than 150 calories per  cup serving, the swap will also keep you more full and youll get a metabolism boost from the added fiber. 16. Snack on pistachios instead of macadamia nuts. Believe it or not, you get the same amount of calories from 35 pistachios (100 calories) as you would from only five macadamia nuts. 17. Chow down on kale chips rather than potato chips. This is my favorite dont knock it till you try it swap. Kale chips are so easy to make at home, and you can spice them up with a little grated parmesan or chili powder. Plus, theyre a mere fraction of the calories of potato chips, but with the same crunch factor we crave so often. 18. Add seltzer and some fruit slices to your cocktail instead of soda or fruit juice. One cup of soda or fruit juice can pack on as much as 140 calories. Instead, use seltzer and fruit slices. The fruit provides valuable phytochemicals, such as flavonoids and anthocyanins, which help to combat cancer and stave off the aging process.

## 2018-05-29 ENCOUNTER — Ambulatory Visit (INDEPENDENT_AMBULATORY_CARE_PROVIDER_SITE_OTHER): Payer: BLUE CROSS/BLUE SHIELD | Admitting: Physician Assistant

## 2018-05-29 ENCOUNTER — Encounter: Payer: Self-pay | Admitting: Physician Assistant

## 2018-05-29 VITALS — BP 148/93 | HR 118 | Ht 64.0 in | Wt 220.2 lb

## 2018-05-29 DIAGNOSIS — E119 Type 2 diabetes mellitus without complications: Secondary | ICD-10-CM | POA: Diagnosis not present

## 2018-05-29 DIAGNOSIS — R000 Tachycardia, unspecified: Secondary | ICD-10-CM | POA: Diagnosis not present

## 2018-05-29 DIAGNOSIS — I10 Essential (primary) hypertension: Secondary | ICD-10-CM

## 2018-05-29 DIAGNOSIS — E785 Hyperlipidemia, unspecified: Secondary | ICD-10-CM

## 2018-05-29 DIAGNOSIS — I739 Peripheral vascular disease, unspecified: Secondary | ICD-10-CM

## 2018-05-29 MED ORDER — METOPROLOL TARTRATE 50 MG PO TABS: 75.0000 mg | ORAL_TABLET | Freq: Two times a day (BID) | ORAL | 3 refills | Status: DC

## 2018-05-29 NOTE — Progress Notes (Signed)
Cardiology Office Note    Date:  05/31/2018   ID:  Julie Prince, DOB 02/17/1961, MRN [id number]  PCP:  Mellody Dance, DO  Cardiologist: Dr. Oval Linsey  Chief Complaint  Patient presents with  . Follow-up    seen for Dr. Oval Linsey    History of Present Illness:  Julie Prince is a 58 y.o. female with past medical history of hypertension, hyperlipidemia, DM 2, inappropriate sinus tachycardia and a history of Barrett's esophagus.  Patient was seen in the ED in December 2019 for lower extremity edema.  She was noted to be tachycardic at the time.  She was started on carvedilol.  When she returned to see her PCP in January, she was noted to be persistently tachycardic as well.  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  She was asymptomatic and was referred to cardiology service for further evaluation.  TSH was normal.  She also described significant left foot pain as well.  Symptom was concerning for lower extremity arterial disease, ABI was obtained.  Carvedilol was later switched to metoprolol for better rate control.  Echocardiogram obtained on 04/18/2018 showed EF 55 to 60%, no significant valvular issue.  Patient was recently seen by Dr. Fletcher Anon for peripheral arterial disease and breast pain.  It was recommended for the patient to proceed with abdominal aortogram with lower extremity runoff and possibly endovascular intervention.  Last week for lower extremity edema and inappropriate sinus tachycardia.  Patient presents today for cardiology office visit.  She has chronic left lower extremity edema more than the right lower extremity.  She is pending upcoming lower extremity angiography by Dr. Fletcher Anon.  Otherwise her major limitation is coming from the left lower extremity pain.  Otherwise she is able to do everything at home.  Her heart rate continue to be borderline elevated.  There is no identifiable secondary cause in this case.  I will increase metoprolol to 75 mg twice daily.  Otherwise  she appears to be euvolemic on physical exam.    Past Medical History:  Diagnosis Date  . Allergy   . Barrett esophagus   . Cataract    left cataract removal  . Essential hypertension, benign 09/12/2013  . GERD (gastroesophageal reflux disease)   . Hyperlipidemia 09/12/2013  . 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 Type 2 diabetes mellitus ([place]) 09/12/2013   Dr. Posey Pronto at [facility]     Past Surgical History:  Procedure Laterality Date  . APPENDECTOMY    . CATARACT EXTRACTION Left   . CESAREAN SECTION  1990  . COLONOSCOPY    . right tube and ovary removed    . WISDOM TOOTH EXTRACTION      Current Medications: Outpatient Medications Prior to Visit  Medication Sig Dispense Refill  . aspirin 81 MG chewable tablet Chew by mouth daily.    Marland Kitchen atorvastatin (LIPITOR) 40 MG tablet Take 40 mg by mouth daily.  3  . Semaglutide, 1 MG/DOSE, (OZEMPIC, 1 MG/DOSE,) 2 MG/1.5ML SOPN Inject 1 mg into the skin once a week. 18 pen 1  . metoprolol tartrate (LOPRESSOR) 50 MG tablet Take 1 tablet (50 mg total) by mouth 2 (two) times daily. 180 tablet 3   Facility-Administered Medications Prior to Visit  Medication Dose Route Frequency Provider Last Rate Last Dose  . 0.9 %  sodium chloride infusion  500 mL Intravenous Once Pyrtle, Lajuan Lines, MD         Allergies:   Penicillins and Metformin and related   Social History  Socioeconomic History  . Marital status: Married    Spouse name: Not on file  . Number of children: Not on file  . Years of education: Not on file  . Highest education level: Not on file  Occupational History  . Not on file  Social Needs  . Financial resource strain: Not on file  . Food insecurity:    Worry: Not on file    Inability: Not on file  . Transportation needs:    Medical: Not on file    Non-medical: Not on file  Tobacco Use  . Smoking status: Former Smoker    Packs/day: 0.50  . Smokeless tobacco: Never Used  . Tobacco comment: quit 30 plus  years ago.  Substance and Sexual Activity  . Alcohol use: No    Alcohol/week: 0.0 standard drinks  . Drug use: No  . Sexual activity: Yes    Birth control/protection: None  Lifestyle  . Physical activity:    Days per week: Not on file    Minutes per session: Not on file  . Stress: Not on file  Relationships  . Social connections:    Talks on phone: Not on file    Gets together: Not on file    Attends religious service: Not on file    Active member of club or organization: Not on file    Attends meetings of clubs or organizations: Not on file    Relationship status: Not on file  Other Topics Concern  . Not on file  Social History Narrative  . Not on file     Family History:  The patient's family history includes Alzheimer's disease in her maternal grandmother; Fibromyalgia in her sister; Heart disease in her maternal aunt; Lung cancer in her mother; Stroke in her father.   ROS:   Please see the history of present illness.    ROS All other systems reviewed and are negative.   PHYSICAL EXAM:   VS:  BP (!) 148/93   Pulse (!) 118   Ht 5\' 4"  (1.626 m)   Wt 220 lb 3.2 oz (99.9 kg)   BMI 37.80 kg/m    GEN: Well nourished, well developed, in no acute distress  HEENT: normal  Neck: no JVD, carotid bruits, or masses Cardiac: RRR; no murmurs, rubs, or gallops,no edema  Respiratory:  clear to auscultation bilaterally, normal work of breathing GI: soft, nontender, nondistended, + BS MS: no deformity or atrophy  Skin: warm and dry, no rash Neuro:  Alert and Oriented x 3, Strength and sensation are intact Psych: euthymic mood, full affect  Wt Readings from Last 3 Encounters:  05/29/18 220 lb 3.2 oz (99.9 kg)  05/28/18 222 lb (100.7 kg)  05/22/18 213 lb (96.6 kg)      Studies/Labs Reviewed:   EKG:  EKG is not ordered today.    Recent Labs: 07/21/2017: ALT 9 04/11/2018: TSH 2.080 05/22/2018: BUN 7; Creatinine, Ser 0.79; Hemoglobin 13.7; Platelets 293; Potassium 4.5; Sodium  142   Lipid Panel    Component Value Date/Time   CHOL 127 07/21/2017 0829   TRIG 102 07/21/2017 0829   HDL 35 (L) 07/21/2017 0829   CHOLHDL 3.6 07/21/2017 0829   CHOLHDL 6.4 (H) 04/26/2016 1412   VLDL 32 (H) 04/26/2016 1412   LDLCALC 72 07/21/2017 0829    Additional studies/ records that were reviewed today include:   Echo 04/18/2018 1. The left ventricle appears to be normal in size, have normal wall thickness, with normal systolic function  of 55-60%. Echo evidence of normal in diastolic filling patterns.  2. Right ventricular systolic pressure is could not be assessed.  3. The right ventricle is normal in size, has normal wall thickness and normal systolic function.  4. Normal left atrial size.  5. Normal right atrial size.  6. Mild mitral annular calcification.  7. The mitral valve normal in structure and function.  8. Normal tricuspid valve.  9. Aortic valve normal. 10. The ascending aorta and aortic rootare normal is size and structure. 11. No atrial level shunt detected by color flow Doppler.    ASSESSMENT:    1. Inappropriate sinus tachycardia   2. Essential hypertension   3. Hyperlipidemia, unspecified hyperlipidemia type   4. Controlled type 2 diabetes mellitus without complication, without long-term current use of insulin ([place])   5. PAD (peripheral artery disease) ([name])      PLAN:  In order of problems listed above:  1. Inappropriate sinus tachycardia: Increase metoprolol to 75 mg twice daily for better rate control.  2. PAD: Pending upcoming lower extremity angiography by Dr. Fletcher Anon  3. Hypertension: Blood pressure borderline, increase metoprolol to 75 mg twice daily  4. Hyperlipidemia: Continue Lipitor 40 mg daily  5. DM2: Managed by primary care provider   Medication Adjustments/Labs and Tests Ordered: Current medicines are reviewed at length with the patient today.  Concerns regarding medicines are outlined above.  Medication changes, Labs and Tests  ordered today are listed in the Patient Instructions below. Patient Instructions  Medication Instructions:  INCREASE METOPROLOL TARTRATE TO 75 MG (1.5 TABLETS) 2 TIMES A DAY   If you need a refill on your cardiac medications before your next appointment, please call your pharmacy.   Lab work: NONE   If you have labs (blood work) drawn today and your tests are completely normal, you will receive your results only by: Marland Kitchen MyChart Message (if you have MyChart) OR . A paper copy in the mail If you have any lab test that is abnormal or we need to change your treatment, we will call you to review the results.  Testing/Procedures: NONE   Follow-Up: At [facility], you and your health needs are our priority.  As part of our continuing mission to provide you with exceptional heart care, we have created designated Provider Care Teams.  These Care Teams include your primary Cardiologist (physician) and Advanced Practice Providers (APPs -  Physician Assistants and Nurse Practitioners) who all work together to provide you with the care you need, when you need it. . You will need a follow up appointment in 3 months with Almyra Deforest, PA-C   Any Other Special Instructions Will Be Listed Below (If Applicable).        Hilbert Corrigan, [place]  05/31/2018 10:47 PM    [facility] [place], [place], [place]  [address] Phone: [phone]; Fax: [phone]

## 2018-05-29 NOTE — Patient Instructions (Addendum)
Medication Instructions:  INCREASE METOPROLOL TARTRATE TO 75 MG (1.5 TABLETS) 2 TIMES A DAY   If you need a refill on your cardiac medications before your next appointment, please call your pharmacy.   Lab work: NONE   If you have labs (blood work) drawn today and your tests are completely normal, you will receive your results only by: Marland Kitchen MyChart Message (if you have MyChart) OR . A paper copy in the mail If you have any lab test that is abnormal or we need to change your treatment, we will call you to review the results.  Testing/Procedures: NONE   Follow-Up: At [facility], you and your health needs are our priority.  As part of our continuing mission to provide you with exceptional heart care, we have created designated Provider Care Teams.  These Care Teams include your primary Cardiologist (physician) and Advanced Practice Providers (APPs -  Physician Assistants and Nurse Practitioners) who all work together to provide you with the care you need, when you need it. . You will need a follow up appointment in 3 months with Julie Deforest, PA-C   Any Other Special Instructions Will Be Listed Below (If Applicable).

## 2018-05-31 ENCOUNTER — Encounter: Payer: Self-pay | Admitting: Physician Assistant

## 2018-05-31 ENCOUNTER — Telehealth: Payer: Self-pay | Admitting: Cardiovascular Disease

## 2018-05-31 NOTE — Telephone Encounter (Signed)
Left message to call back  

## 2018-05-31 NOTE — Telephone Encounter (Signed)
  Patient is having a procedure on Wednesday (cath) and she would like to know will someone need to stay with her overnight or will she be okay by herself.

## 2018-06-01 ENCOUNTER — Encounter: Payer: Self-pay | Admitting: Cardiology

## 2018-06-01 NOTE — Telephone Encounter (Signed)
Advised patient, verbalized understanding.    Julie Prince F at 06/01/2018 10:36 AM   Status: Signed    New Message   Patient is calling today to see if she needs someone there with her on Wednesday. Pls call and advise.

## 2018-06-01 NOTE — Telephone Encounter (Signed)
This encounter was created in error - please disregard.

## 2018-06-01 NOTE — Telephone Encounter (Signed)
New Message         Patient is calling today to see if she needs someone there with her on Wednesday. Pls call and advise.

## 2018-06-05 ENCOUNTER — Telehealth: Payer: Self-pay | Admitting: *Deleted

## 2018-06-05 NOTE — Telephone Encounter (Addendum)
Pt contacted pre-catheterization scheduled at [facility] for: Wednesday June 06, 2018 10:30 AM Verified arrival time and place: [facility] A at: 8:30 AM  No solid food after midnight prior to cath, clear liquids until 5 AM day of procedure. Contrast allergy: no   AM meds can be  taken pre-cath with sip of water including: ASA 81 mg  Confirm patient has responsible person to drive home post procedure and observe 24 hours after arriving home: yes

## 2018-06-06 ENCOUNTER — Other Ambulatory Visit: Payer: Self-pay

## 2018-06-06 ENCOUNTER — Encounter (HOSPITAL_COMMUNITY)
Admission: RE | Disposition: A | Discharge status: Home / Self Care | Payer: Self-pay | Source: Home / Self Care | Attending: Cardiovascular Disease

## 2018-06-06 ENCOUNTER — Encounter (HOSPITAL_COMMUNITY): Payer: Self-pay | Admitting: Cardiovascular Disease

## 2018-06-06 ENCOUNTER — Ambulatory Visit (HOSPITAL_COMMUNITY)
Admission: RE | Admit: 2018-06-06 | Discharge: 2018-06-06 | Disposition: A | Discharge status: Home / Self Care | Payer: BLUE CROSS/BLUE SHIELD | Attending: Cardiovascular Disease | Admitting: Cardiovascular Disease

## 2018-06-06 DIAGNOSIS — H269 Unspecified cataract: Secondary | ICD-10-CM | POA: Insufficient documentation

## 2018-06-06 DIAGNOSIS — Z888 Allergy status to other drugs, medicaments and biological substances status: Secondary | ICD-10-CM | POA: Insufficient documentation

## 2018-06-06 DIAGNOSIS — Z8249 Family history of ischemic heart disease and other diseases of the circulatory system: Secondary | ICD-10-CM | POA: Diagnosis not present

## 2018-06-06 DIAGNOSIS — Z794 Long term (current) use of insulin: Secondary | ICD-10-CM | POA: Diagnosis not present

## 2018-06-06 DIAGNOSIS — I10 Essential (primary) hypertension: Secondary | ICD-10-CM | POA: Diagnosis not present

## 2018-06-06 DIAGNOSIS — R000 Tachycardia, unspecified: Secondary | ICD-10-CM | POA: Diagnosis not present

## 2018-06-06 DIAGNOSIS — Z79899 Other long term (current) drug therapy: Secondary | ICD-10-CM | POA: Insufficient documentation

## 2018-06-06 DIAGNOSIS — Z823 Family history of stroke: Secondary | ICD-10-CM | POA: Insufficient documentation

## 2018-06-06 DIAGNOSIS — I739 Peripheral vascular disease, unspecified: Secondary | ICD-10-CM

## 2018-06-06 DIAGNOSIS — Z7982 Long term (current) use of aspirin: Secondary | ICD-10-CM | POA: Diagnosis not present

## 2018-06-06 DIAGNOSIS — Z87891 Personal history of nicotine dependence: Secondary | ICD-10-CM | POA: Insufficient documentation

## 2018-06-06 DIAGNOSIS — K219 Gastro-esophageal reflux disease without esophagitis: Secondary | ICD-10-CM | POA: Diagnosis not present

## 2018-06-06 DIAGNOSIS — I70223 Atherosclerosis of native arteries of extremities with rest pain, bilateral legs: Secondary | ICD-10-CM | POA: Insufficient documentation

## 2018-06-06 DIAGNOSIS — I70213 Atherosclerosis of native arteries of extremities with intermittent claudication, bilateral legs: Secondary | ICD-10-CM | POA: Diagnosis not present

## 2018-06-06 DIAGNOSIS — E785 Hyperlipidemia, unspecified: Secondary | ICD-10-CM | POA: Insufficient documentation

## 2018-06-06 DIAGNOSIS — K2270 Barrett's esophagus without dysplasia: Secondary | ICD-10-CM | POA: Diagnosis not present

## 2018-06-06 DIAGNOSIS — Z880 Allergy status to penicillin: Secondary | ICD-10-CM | POA: Diagnosis not present

## 2018-06-06 DIAGNOSIS — E1151 Type 2 diabetes mellitus with diabetic peripheral angiopathy without gangrene: Secondary | ICD-10-CM | POA: Diagnosis not present

## 2018-06-06 HISTORY — PX: ABDOMINAL AORTOGRAM W/LOWER EXTREMITY: CATH118223

## 2018-06-06 LAB — GLUCOSE, CAPILLARY: Glucose-Capillary: 87 mg/dL (ref 70–99)

## 2018-06-06 SURGERY — ABDOMINAL AORTOGRAM W/LOWER EXTREMITY
Anesthesia: LOCAL

## 2018-06-06 MED ORDER — ONDANSETRON HCL 4 MG/2ML IJ SOLN: 4.0000 mg | Freq: Four times a day (QID) | INTRAMUSCULAR | Status: DC | PRN

## 2018-06-06 MED ORDER — SODIUM CHLORIDE 0.9% FLUSH: 3.0000 mL | INTRAVENOUS | Status: DC | PRN

## 2018-06-06 MED ORDER — IODIXANOL 320 MG/ML IV SOLN
INTRAVENOUS | Status: DC | PRN
Start: 2018-06-06 — End: 2018-06-06
  Administered 2018-06-06: 90 mL via INTRA_ARTERIAL

## 2018-06-06 MED ORDER — HEPARIN (PORCINE) IN NACL 1000-0.9 UT/500ML-% IV SOLN
INTRAVENOUS | Status: AC
  Filled 2018-06-06: qty 1000

## 2018-06-06 MED ORDER — LIDOCAINE HCL (PF) 1 % IJ SOLN
INTRAMUSCULAR | Status: AC
  Filled 2018-06-06: qty 30

## 2018-06-06 MED ORDER — HEPARIN (PORCINE) IN NACL 1000-0.9 UT/500ML-% IV SOLN
INTRAVENOUS | Status: DC | PRN
  Administered 2018-06-06 (×2): 500 mL

## 2018-06-06 MED ORDER — CILOSTAZOL 50 MG PO TABS: 50.0000 mg | ORAL_TABLET | Freq: Two times a day (BID) | ORAL | 3 refills | Status: DC

## 2018-06-06 MED ORDER — LIDOCAINE HCL (PF) 1 % IJ SOLN
INTRAMUSCULAR | Status: DC | PRN
  Administered 2018-06-06: 20 mL via INTRADERMAL

## 2018-06-06 MED ORDER — MIDAZOLAM HCL 2 MG/2ML IJ SOLN
INTRAMUSCULAR | Status: AC
  Filled 2018-06-06: qty 2

## 2018-06-06 MED ORDER — SODIUM CHLORIDE 0.9 % IV SOLN
INTRAVENOUS | Status: DC
  Administered 2018-06-06: 10:00:00 via INTRAVENOUS

## 2018-06-06 MED ORDER — SODIUM CHLORIDE 0.9 % IV SOLN: INTRAVENOUS | Status: DC

## 2018-06-06 MED ORDER — SODIUM CHLORIDE 0.9 % IV SOLN: 250.0000 mL | INTRAVENOUS | Status: DC | PRN

## 2018-06-06 MED ORDER — FENTANYL CITRATE (PF) 100 MCG/2ML IJ SOLN
INTRAMUSCULAR | Status: DC | PRN
  Administered 2018-06-06: 25 ug via INTRAVENOUS

## 2018-06-06 MED ORDER — SODIUM CHLORIDE 0.9% FLUSH: 3.0000 mL | Freq: Two times a day (BID) | INTRAVENOUS | Status: DC

## 2018-06-06 MED ORDER — LABETALOL HCL 5 MG/ML IV SOLN
INTRAVENOUS | Status: AC
  Filled 2018-06-06: qty 4

## 2018-06-06 MED ORDER — FENTANYL CITRATE (PF) 100 MCG/2ML IJ SOLN
INTRAMUSCULAR | Status: AC
  Filled 2018-06-06: qty 2

## 2018-06-06 MED ORDER — MIDAZOLAM HCL 2 MG/2ML IJ SOLN
INTRAMUSCULAR | Status: DC | PRN
  Administered 2018-06-06: 1 mg via INTRAVENOUS

## 2018-06-06 MED ORDER — LABETALOL HCL 5 MG/ML IV SOLN
INTRAVENOUS | Status: DC | PRN
  Administered 2018-06-06: 10 mg via INTRAVENOUS

## 2018-06-06 MED ORDER — ASPIRIN 81 MG PO CHEW: 81.0000 mg | CHEWABLE_TABLET | ORAL | Status: DC

## 2018-06-06 MED ORDER — ACETAMINOPHEN 325 MG PO TABS: 650.0000 mg | ORAL_TABLET | ORAL | Status: DC | PRN

## 2018-06-06 MED ORDER — LABETALOL HCL 5 MG/ML IV SOLN: 10.0000 mg | INTRAVENOUS | Status: DC | PRN

## 2018-06-06 SURGICAL SUPPLY — 15 items
CATH ANGIO 5F PIGTAIL 65CM (CATHETERS) ×2 IMPLANT
CATH CROSS OVER TEMPO 5F (CATHETERS) ×2 IMPLANT
CATH STRAIGHT 5FR 65CM (CATHETERS) ×2 IMPLANT
CLOSURE MYNX CONTROL 5F (Vascular Products) ×2 IMPLANT
KIT MICROPUNCTURE NIT STIFF (SHEATH) ×2 IMPLANT
KIT PV (KITS) ×2 IMPLANT
SHEATH PINNACLE 5F 10CM (SHEATH) ×2 IMPLANT
SHEATH PROBE COVER 6X72 (BAG) ×2 IMPLANT
STOPCOCK MORSE 400PSI 3WAY (MISCELLANEOUS) ×2 IMPLANT
SYR MEDRAD MARK 7 150ML (SYRINGE) ×2 IMPLANT
TRANSDUCER W/STOPCOCK (MISCELLANEOUS) ×2 IMPLANT
TRAY PV CATH (CUSTOM PROCEDURE TRAY) ×2 IMPLANT
TUBING CIL FLEX 10 FLL-RA (TUBING) ×2 IMPLANT
WIRE HITORQ VERSACORE ST 145CM (WIRE) ×2 IMPLANT
WIRE TORQFLEX AUST .018X40CM (WIRE) ×2 IMPLANT

## 2018-06-06 NOTE — Interval H&P Note (Signed)
History and Physical Interval Note:  06/06/2018 11:10 AM  Julie Prince  has presented today for surgery, with the diagnosis of peripheral artery disease.  The various methods of treatment have been discussed with the patient and family. After consideration of risks, benefits and other options for treatment, the patient has consented to  Procedure(s): ABDOMINAL AORTOGRAM W/LOWER EXTREMITY (N/A) as a surgical intervention.  The patient's history has been reviewed, patient examined, no change in status, stable for surgery.  I have reviewed the patient's chart and labs.  Questions were answered to the patient's satisfaction.     [name]

## 2018-06-06 NOTE — Discharge Instructions (Signed)
Femoral Site Care °This sheet gives you information about how to care for yourself after your procedure. Your health care provider may also give you more specific instructions. If you have problems or questions, contact your health care provider. °What can I expect after the procedure? °After the procedure, it is common to have: °· Bruising that usually fades within 1-2 weeks. °· Tenderness at the site. °Follow these instructions at home: °Wound care °· Follow instructions from your health care provider about how to take care of your insertion site. Make sure you: °? Wash your hands with soap and water before you change your bandage (dressing). If soap and water are not available, use hand sanitizer. °? Change your dressing as told by your health care provider. °? Leave stitches (sutures), skin glue, or adhesive strips in place. These skin closures may need to stay in place for 2 weeks or longer. If adhesive strip edges start to loosen and curl up, you may trim the loose edges. Do not remove adhesive strips completely unless your health care provider tells you to do that. °· Do not take baths, swim, or use a hot tub until your health care provider approves. °· You may shower 24-48 hours after the procedure or as told by your health care provider. °? Gently wash the site with plain soap and water. °? Pat the area dry with a clean towel. °? Do not rub the site. This may cause bleeding. °· Do not apply powder or lotion to the site. Keep the site clean and dry. °· Check your femoral site every day for signs of infection. Check for: °? Redness, swelling, or pain. °? Fluid or blood. °? Warmth. °? Pus or a bad smell. °Activity °· For the first 2-3 days after your procedure, or as long as directed: °? Avoid climbing stairs as much as possible. °? Do not squat. °· Do not lift anything that is heavier than 10 lb (4.5 kg), or the limit that you are told, until your health care provider says that it is safe. °· Rest as  directed. °? Avoid sitting for a long time without moving. Get up to take short walks every 1-2 hours. °· Do not drive for 24 hours if you were given a medicine to help you relax (sedative). °General instructions °· Take over-the-counter and prescription medicines only as told by your health care provider. °· Keep all follow-up visits as told by your health care provider. This is important. °Contact a health care provider if you have: °· A fever or chills. °· You have redness, swelling, or pain around your insertion site. °Get help right away if: °· The catheter insertion area swells very fast. °· You pass out. °· You suddenly start to sweat or your skin gets clammy. °· The catheter insertion area is bleeding, and the bleeding does not stop when you hold steady pressure on the area. °· The area near or just beyond the catheter insertion site becomes pale, cool, tingly, or numb. °These symptoms may represent a serious problem that is an emergency. Do not wait to see if the symptoms will go away. Get medical help right away. Call your local emergency services (911 in the [place] Do not drive yourself to the hospital. °Summary °· After the procedure, it is common to have bruising that usually fades within 1-2 weeks. °· Check your femoral site every day for signs of infection. °· Do not lift anything that is heavier than 10 lb (4.5 kg), or the   limit that you are told, until your health care provider says that it is safe. °This information is not intended to replace advice given to you by your health care provider. Make sure you discuss any questions you have with your health care provider. °Document Released: 11/08/2013 Document Revised: 03/20/2017 Document Reviewed: 03/20/2017 °Elsevier Interactive Patient Education © 2019 [employer] ° °

## 2018-06-06 NOTE — Progress Notes (Signed)
No bleeding or hematoma noted after ambulation 

## 2018-07-09 ENCOUNTER — Telehealth: Payer: Self-pay

## 2018-07-11 NOTE — Telephone Encounter (Signed)
YOUR CARDIOLOGY TEAM HAS ARRANGED FOR AN E-VISIT FOR YOUR APPOINTMENT - PLEASE REVIEW IMPORTANT INFORMATION BELOW SEVERAL DAYS PRIOR TO YOUR APPOINTMENT  Due to the recent COVID-19 pandemic, we are transitioning in-person office visits to tele-medicine visits in an effort to decrease unnecessary exposure to our patients, their families, and staff. These visits are billed to your insurance just like a normal visit is. We also encourage you to sign up for MyChart if you have not already done so. You will need a smartphone if possible. For patients that do not have this, we can still complete the visit using a regular telephone but do prefer a smartphone to enable video when possible. You may have a family member that lives with you that can help. If possible, we also ask that you have a blood pressure cuff and scale at home to measure your blood pressure, heart rate and weight prior to your scheduled appointment. Patients with clinical needs that need an in-person evaluation and testing will still be able to come to the office if absolutely necessary. If you have any questions, feel free to call our office.     YOUR PROVIDER WILL BE USING THE FOLLOWING PLATFORM TO COMPLETE YOUR VISIT: YES  . IF USING MYCHART - How to Download the MyChart App to Your SmartPhone   - If Apple, go to [employer] and type in MyChart in the search bar and download the app. If Android, ask patient to go to [employer] and type in [place] in the search bar and download the app. The app is free but as with any other app downloads, your phone may require you to verify saved payment information or Apple/Android password.  - You will need to then log into the app with your MyChart username and password, and select [facility] as your healthcare provider to link the account.  - When it is time for your visit, go to the MyChart app, find appointments, and click Begin Video Visit. Be sure to Select Allow for your device to access  the Microphone and Camera for your visit. You will then be connected, and your provider will be with you shortly.  **If you have any issues connecting or need assistance, please contact MyChart service desk [phone])**  **If using a computer, in order to ensure the best quality for your visit, you will need to use either of the following Internet Browsers: Insurance underwriter or [employer]**  . IF USING DOXIMITY or DOXY.ME - The staff will give you instructions on receiving your link to join the meeting the day of your visit.      2-3 DAYS BEFORE YOUR APPOINTMENT  You will receive a telephone call from one of our [facility] team members - your caller ID may say "Unknown caller." If this is a video visit, we will walk you through how to get the video launched on your phone. We will remind you check your blood pressure, heart rate and weight prior to your scheduled appointment. If you have an Apple Watch or Kardia, please upload any pertinent ECG strips the day before or morning of your appointment to [facility]. Our staff will also make sure you have reviewed the consent and agree to move forward with your scheduled tele-health visit.     THE DAY OF YOUR APPOINTMENT  Approximately 15 minutes prior to your scheduled appointment, you will receive a telephone call from one of [facility] team - your caller ID may say "Unknown caller."  Our staff will confirm medications, vital signs for the day and any symptoms you may be experiencing. Please have this information available prior to the time of visit start. It may also be helpful for you to have a pad of paper and pen handy for any instructions given during your visit. They will also walk you through joining the smartphone meeting if this is a video visit.    CONSENT FOR TELE-HEALTH VISIT - PLEASE REVIEW  I hereby voluntarily request, consent and authorize [facility] and its employed or contracted physicians, physician assistants,  nurse practitioners or other licensed health care professionals (the Practitioner), to provide me with telemedicine health care services (the "Services") as deemed necessary by the treating Practitioner. I acknowledge and consent to receive the Services by the Practitioner via telemedicine. I understand that the telemedicine visit will involve communicating with the Practitioner through live audiovisual communication technology and the disclosure of certain medical information by electronic transmission. I acknowledge that I have been given the opportunity to request an in-person assessment or other available alternative prior to the telemedicine visit and am voluntarily participating in the telemedicine visit.  I understand that I have the right to withhold or withdraw my consent to the use of telemedicine in the course of my care at any time, without affecting my right to future care or treatment, and that the Practitioner or I may terminate the telemedicine visit at any time. I understand that I have the right to inspect all information obtained and/or recorded in the course of the telemedicine visit and may receive copies of available information for a reasonable fee.  I understand that some of the potential risks of receiving the Services via telemedicine include:  Marland Kitchen Delay or interruption in medical evaluation due to technological equipment failure or disruption; . Information transmitted may not be sufficient (e.g. poor resolution of images) to allow for appropriate medical decision making by the Practitioner; and/or  . In rare instances, security protocols could fail, causing a breach of personal health information.  Furthermore, I acknowledge that it is my responsibility to provide information about my medical history, conditions and care that is complete and accurate to the best of my ability. I acknowledge that Practitioner's advice, recommendations, and/or decision may be based on factors not  within their control, such as incomplete or inaccurate data provided by me or distortions of diagnostic images or specimens that may result from electronic transmissions. I understand that the practice of medicine is not an exact science and that Practitioner makes no warranties or guarantees regarding treatment outcomes. I acknowledge that I will receive a copy of this consent concurrently upon execution via email to the email address I last provided but may also request a printed copy by calling the office of [facility].    I understand that my insurance will be billed for this visit.   I have read or had this consent read to me. . I understand the contents of this consent, which adequately explains the benefits and risks of the Services being provided via telemedicine.  . I have been provided ample opportunity to ask questions regarding this consent and the Services and have had my questions answered to my satisfaction. . I give my informed consent for the services to be provided through the use of telemedicine in my medical care  By participating in this telemedicine visit I agree to the above. YES

## 2018-07-12 ENCOUNTER — Ambulatory Visit: Payer: BLUE CROSS/BLUE SHIELD | Admitting: Family Medicine

## 2018-07-17 ENCOUNTER — Telehealth (INDEPENDENT_AMBULATORY_CARE_PROVIDER_SITE_OTHER): Payer: BLUE CROSS/BLUE SHIELD | Admitting: Cardiovascular Disease

## 2018-07-17 ENCOUNTER — Ambulatory Visit (INDEPENDENT_AMBULATORY_CARE_PROVIDER_SITE_OTHER): Payer: BLUE CROSS/BLUE SHIELD | Admitting: Family Medicine

## 2018-07-17 ENCOUNTER — Encounter: Payer: Self-pay | Admitting: Cardiovascular Disease

## 2018-07-17 ENCOUNTER — Ambulatory Visit: Payer: Self-pay

## 2018-07-17 ENCOUNTER — Other Ambulatory Visit: Payer: Self-pay

## 2018-07-17 ENCOUNTER — Encounter: Payer: Self-pay | Admitting: Family Medicine

## 2018-07-17 VITALS — HR 80 | Ht 64.0 in | Wt 215.0 lb

## 2018-07-17 VITALS — BP 160/84 | Ht 64.0 in | Wt 215.0 lb

## 2018-07-17 DIAGNOSIS — M25572 Pain in left ankle and joints of left foot: Secondary | ICD-10-CM

## 2018-07-17 DIAGNOSIS — I739 Peripheral vascular disease, unspecified: Secondary | ICD-10-CM | POA: Diagnosis not present

## 2018-07-17 MED ORDER — CILOSTAZOL 50 MG PO TABS: 50.0000 mg | ORAL_TABLET | Freq: Two times a day (BID) | ORAL | 4 refills | Status: DC

## 2018-07-17 NOTE — Patient Instructions (Signed)
You have an insertional peroneal tendinopathy. Ice the area 15 minutes at a time 3-4 times a day as needed. Wear supportive shoes, consider OTC orthotics for cushion and arch support. Start physical therapy and do home exercises on days you don't go to therapy - this is the most important part of treatment. Topical aspercreme or biofreeze may help with pain as well up to 4 times a day. Follow up with me in 6 weeks for reevaluation.

## 2018-07-17 NOTE — Progress Notes (Signed)
   HPI  CC: Foot pain  Julie Prince is a 58 year old female who presents for left foot pain.  She states the pain started back in October.  She has had swelling throughout her leg during that time.  She was found to have PAD in interim, and had vascular surgery follow-up with her.  She denied any stents placed, but was put on Pletal.  She states this helped the swelling in her leg and the pain in her foot.  Now she has pain at the base of her fifth.  She states is worse when she does any prolonged walking.  She has been taking Tylenol as needed, which seems to be helping somewhat.  She denies any numbness or tingling in her foot.  She denies any weakness in the foot.  She denies any foot drop.  She reports some swelling in the area still.  She denies any fevers or chills.  See HPI and/or previous note for associated ROS.  Objective: BP (!) 160/84   Ht 5\' 4"  (1.626 m)   Wt 215 lb (97.5 kg)   BMI 36.90 kg/m  Gen:NAD, well groomed, a/o x3, normal affect.  CV: Well-perfused. Warm.  Resp: Non-labored.  Neuro: Sensation intact throughout. No gross coordination deficits.  Gait: Nonpathologic posture, unremarkable stride without signs of limp or balance issues.  Left foot exam: No erythema or warmth noted.  Mild swelling from the ankle down.  Nonpitting.  Tenderness to palpation at the base the fifth metatarsal.  Full range of motion dorsiflexion, plantarflexion, eversion, inversion.  Pain with resisted eversion.  Strength 5 out of 5 throughout.  Negative anterior drawer test.  Negative squeeze test.  Right foot/ankle: No gross deformity, swelling, ecchymoses FROM with 5/5 strength all directions. No TTP NV intact distally.  ULTRASOUND: Ankle, left Diagnostic limited ultrasound imaging obtained of patient's left peroneal tendons.  - Lateral tendons: Peroneal brevis tendon observed at the insertion site of the base the fifth metatarsal.  Swelling noted at the insertion site and longitudinal and  transverse views.  No increased vascularization over the base the fifth.  No stress reaction is noted.. Peroneal tubercle noted and normal.  IMPRESSION: findings consistent with peroneal brevis tendinitis.   Abnormalities found, note neovessels:  "  Doppler showed no evidence of neovascularization  "   Assessment and plan: Left-sided peroneal brevis tendinopathy.  We discussed treatment options at today's visit.  Ultrasound was performed as noted above.  At this time we will advise her on some topical cream she can use for pain relief of the area.  We will start her in formal physical therapy for eccentric exercises for the peroneal brevis tendon.  Supportive shoes.  We will see her back in 6 weeks.  If she has no improvement in this area, I would be inclined to get advanced imaging of the foot to rule out base of fifth pathology outside of peroneal brevis.  Lewanda Rife, MD [facility] Fellow 07/17/2018 2:53 PM

## 2018-07-17 NOTE — Progress Notes (Signed)
Virtual Visit via Video Note   This visit type was conducted due to national recommendations for restrictions regarding the COVID-19 Pandemic (e.g. social distancing) in an effort to limit this patient's exposure and mitigate transmission in our community.  Due to her co-morbid illnesses, this patient is at least at moderate risk for complications without adequate follow up.  This format is felt to be most appropriate for this patient at this time.  All issues noted in this document were discussed and addressed.  A limited physical exam was performed with this format.  Please refer to the patient's chart for her consent to telehealth for [facility].   Evaluation Performed:  Follow-up visit  Date:  07/17/2018   ID:  Julie Prince, DOB Sep 16, 1960, MRN [id number]  Patient Location: Home Provider Location: Office  PCP:  Mellody Dance, DO  Cardiologist:  Skeet Latch, MD  Electrophysiologist:  None   Chief Complaint:  f/u  History of Present Illness:    Julie Prince is a 58 y.o. female who was seen via video conference regarding peripheral arterial disease. She has history of inappropriate sinus tachycardia, diabetes and hyperlipidemia.She is a previous smoker and quit more than 20 years ago. She was seen recently for bilateral calf claudication.  Noninvasive vascular evaluation  showed moderately reduced ABI but reliable due to medial calcifications.  Duplex showed bilateral SFA occlusion with one-vessel runoff below the knee. I proceeded with angiography in March which showed no significant aortoiliac disease, on the left, there was flush occlusion of the SFA with reconstitution via extensive collaterals from the profunda with two-vessel runoff below the knee.  On the right side, there was diffuse disease in the proximal portion of the SFA with total occlusion in the midsegment with reconstitution distally via extensive collaterals and one-vessel runoff below the knee.  She was  started on small dose cilostazol.  She reports improvement in symptoms overall.  No chest pain or shortness of breath.  No palpitations  The patient does not have symptoms concerning for COVID-19 infection (fever, chills, cough, or new shortness of breath).    Past Medical History:  Diagnosis Date  . Allergy   . Barrett esophagus   . Cataract    left cataract removal  . Essential hypertension, benign 09/12/2013  . GERD (gastroesophageal reflux disease)   . Hyperlipidemia 09/12/2013  . 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 Type 2 diabetes mellitus ([place]) 09/12/2013   Dr. Posey Pronto at [facility]    Past Surgical History:  Procedure Laterality Date  . ABDOMINAL AORTOGRAM W/LOWER EXTREMITY N/A 06/06/2018   Procedure: ABDOMINAL AORTOGRAM W/LOWER EXTREMITY;  Surgeon: Wellington Hampshire, MD;  Location: [facility];  Service: Cardiovascular;  Laterality: N/A;  . APPENDECTOMY    . CATARACT EXTRACTION Left   . CESAREAN SECTION  1990  . COLONOSCOPY    . right tube and ovary removed    . WISDOM TOOTH EXTRACTION       Current Meds  Medication Sig  . aspirin 81 MG chewable tablet Chew by mouth daily.  Marland Kitchen atorvastatin (LIPITOR) 40 MG tablet Take 40 mg by mouth daily.  . cilostazol (PLETAL) 50 MG tablet Take 1 tablet (50 mg total) by mouth 2 (two) times daily.  . Continuous Blood Gluc Sensor (FREESTYLE LIBRE 14 DAY SENSOR) MISC TEST DAILY AS DIRECTED AND CHANGE EVERY 14 DAYS  . metoprolol tartrate (LOPRESSOR) 50 MG tablet Take 1.5 tablets (75 mg total) by mouth 2 (two) times daily for 30 days.  Marland Kitchen  Semaglutide, 1 MG/DOSE, (OZEMPIC, 1 MG/DOSE,) 2 MG/1.5ML SOPN Inject 1 mg into the skin once a week.   Current Facility-Administered Medications for the 07/17/18 encounter (Telemedicine) with Wellington Hampshire, MD  Medication  . 0.9 %  sodium chloride infusion     Allergies:   Penicillins and Metformin and related   Social History   Tobacco Use  . Smoking status: Former Smoker     Packs/day: 0.50  . Smokeless tobacco: Never Used  . Tobacco comment: quit 30 plus years ago.  Substance Use Topics  . Alcohol use: No    Alcohol/week: 0.0 standard drinks  . Drug use: No     Family Hx: The patient's family history includes Alzheimer's disease in her maternal grandmother; Fibromyalgia in her sister; Heart disease in her maternal aunt; Lung cancer in her mother; Stroke in her father. There is no history of Colon cancer, Esophageal cancer, Rectal cancer, Stomach cancer, or Pancreatic cancer.  ROS:   Please see the history of present illness.     All other systems reviewed and are negative.   Prior CV studies:   The following studies were reviewed today:  I reviewed recent angiogram with her  Labs/Other Tests and Data Reviewed:    EKG:  No ECG reviewed.  Recent Labs: 07/21/2017: ALT 9 04/11/2018: TSH 2.080 05/22/2018: BUN 7; Creatinine, Ser 0.79; Hemoglobin 13.7; Platelets 293; Potassium 4.5; Sodium 142   Recent Lipid Panel Lab Results  Component Value Date/Time   CHOL 127 07/21/2017 08:29 AM   TRIG 102 07/21/2017 08:29 AM   HDL 35 (L) 07/21/2017 08:29 AM   CHOLHDL 3.6 07/21/2017 08:29 AM   CHOLHDL 6.4 (H) 04/26/2016 02:12 PM   LDLCALC 72 07/21/2017 08:29 AM    Wt Readings from Last 3 Encounters:  07/17/18 215 lb (97.5 kg)  06/06/18 (P) 218 lb (98.9 kg)  05/29/18 220 lb 3.2 oz (99.9 kg)     Objective:    Vital Signs:  Pulse 80   Ht 5\' 4"  (1.626 m)   Wt 215 lb (97.5 kg)   BMI 36.90 kg/m    VITAL SIGNS:  reviewed GEN:  no acute distress EYES:  sclerae anicteric, EOMI - Extraocular Movements Intact RESPIRATORY:  normal respiratory effort, symmetric expansion CARDIOVASCULAR:  no peripheral edema SKIN:  no rash, lesions or ulcers. MUSCULOSKELETAL:  no obvious deformities. NEURO:  alert and oriented x 3, no obvious focal deficit PSYCH:  normal affect  ASSESSMENT & PLAN:    1.  Peripheral arterial disease with  bilateral leg claudication due to  bilateral SFA occlusion with extensive collaterals.  I have recommended continued medical therapy.  She reports improvement in symptoms with cilostazol.  I encouraged her to start a walking exercise program.    2.  Essential hypertension: Continue metoprolol.  3.  Hyperlipidemia: Continue treatment with atorvastatin with a target LDL of less than 70.  Most recent lipid profile showed an LDL of 72.  4.  Diabetes mellitus: Most recent hemoglobin A1c improved significantly to 5.8 from 10.  COVID-19 Education: The signs and symptoms of COVID-19 were discussed with the patient and how to seek care for testing (follow up with PCP or arrange E-visit).  The importance of social distancing was discussed today.  Time:   Today, I have spent 20 minutes with the patient with telehealth technology discussing the above problems.     Medication Adjustments/Labs and Tests Ordered: Current medicines are reviewed at length with the patient today.  Concerns regarding medicines  are outlined above.   Tests Ordered: No orders of the defined types were placed in this encounter.   Medication Changes: No orders of the defined types were placed in this encounter.   Disposition:  Follow up in 4 month(s)  Signed, [name], MD  07/17/2018 10:37 AM    [facility]

## 2018-07-17 NOTE — Patient Instructions (Signed)
Medication Instructions:  Continue same medications If you need a refill on your cardiac medications before your next appointment, please call your pharmacy.   Lab work: None If you have labs (blood work) drawn today and your tests are completely normal, you will receive your results only by: Marland Kitchen MyChart Message (if you have MyChart) OR . A paper copy in the mail If you have any lab test that is abnormal or we need to change your treatment, we will call you to review the results.  Testing/Procedures: None  Follow-Up: Office follow-up with me in 4 months

## 2018-07-17 NOTE — Addendum Note (Signed)
Addended by: Ricci Barker on: 07/17/2018 10:58 AM   Modules accepted: Orders

## 2018-07-30 ENCOUNTER — Ambulatory Visit: Payer: BLUE CROSS/BLUE SHIELD | Admitting: Podiatry

## 2018-07-30 ENCOUNTER — Ambulatory Visit (INDEPENDENT_AMBULATORY_CARE_PROVIDER_SITE_OTHER): Payer: BLUE CROSS/BLUE SHIELD

## 2018-07-30 ENCOUNTER — Encounter: Payer: Self-pay | Admitting: Podiatry

## 2018-07-30 ENCOUNTER — Other Ambulatory Visit: Payer: Self-pay

## 2018-07-30 VITALS — BP 145/81 | HR 96 | Temp 97.5°F

## 2018-07-30 DIAGNOSIS — M779 Enthesopathy, unspecified: Secondary | ICD-10-CM | POA: Diagnosis not present

## 2018-07-30 DIAGNOSIS — M21612 Bunion of left foot: Secondary | ICD-10-CM | POA: Diagnosis not present

## 2018-07-30 DIAGNOSIS — M778 Other enthesopathies, not elsewhere classified: Secondary | ICD-10-CM

## 2018-07-30 DIAGNOSIS — R600 Localized edema: Secondary | ICD-10-CM | POA: Diagnosis not present

## 2018-07-30 DIAGNOSIS — M79672 Pain in left foot: Secondary | ICD-10-CM | POA: Diagnosis not present

## 2018-07-30 MED ORDER — MELOXICAM 15 MG PO TABS: 15.0000 mg | ORAL_TABLET | Freq: Every day | ORAL | 1 refills | Status: DC

## 2018-07-30 MED ORDER — METHYLPREDNISOLONE 4 MG PO TBPK: ORAL_TABLET | ORAL | 0 refills | Status: DC

## 2018-07-30 NOTE — Progress Notes (Signed)
   HPI: 58 year old female presents the office today for evaluation of left foot pain.  Patient has had pain and tenderness with swelling to the left foot for approximately 6 months now.  She is seen different physicians who have been unable to explain her symptoms.  Last physician recommended physical therapy, however she did not attend and she presents today for second opinion.  She says that prior to physical therapy she would like to know what the problem is with her left foot.  Past Medical History:  Diagnosis Date  . Allergy   . Barrett esophagus   . Cataract    left cataract removal  . Essential hypertension, benign 09/12/2013  . GERD (gastroesophageal reflux disease)   . Hyperlipidemia 09/12/2013  . 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 Type 2 diabetes mellitus ([place]) 09/12/2013   Dr. Posey Pronto at [facility]      Physical Exam: General: The patient is alert and oriented x3 in no acute distress.  Dermatology: Skin is warm, dry and supple bilateral lower extremities. Negative for open lesions or macerations.  Vascular: Palpable pedal pulses bilaterally.  Heavy pitting edema noted to the foot and ankle left.  Edema does not extend more proximal than the ankle.  Capillary refill within normal limits.  Neurological: Epicritic and protective threshold grossly intact bilaterally.   Musculoskeletal Exam: Range of motion within normal limits to all pedal and ankle joints bilateral. Muscle strength 5/5 in all groups bilateral.  There is pain on palpation along the bases of the second, third, and fourth TMT joints.  Pain along the anterior aspect of the ankle as well.  Radiographic Exam:  Normal osseous mineralization. Joint spaces preserved. No fracture/dislocation/boney destruction.    Assessment: 1.  Left foot and ankle edema 2.  Capsulitis of left foot 3.  Pain in left foot   Plan of Care:  1. Patient evaluated. X-Rays reviewed.  2.  Today working to pursue  conservative management. 3.  Compression anklet dispensed 4.  Immobilization cam boot dispensed.  Weightbearing as tolerated 5.  Prescription for Medrol Dosepak 6.  Prescription for meloxicam 15 mg 7.  Return to clinic in 3 weeks, if the patient is not better we may need to order an MRI since the patient has had the pain and swelling for greater than 6 months now.      Edrick Kins, DPM [facility]  Dr. Edrick Kins, DPM    [address], [place] [address]                Office [phone]  Fax [phone]

## 2018-08-21 ENCOUNTER — Telehealth: Payer: Self-pay | Admitting: *Deleted

## 2018-08-21 NOTE — Telephone Encounter (Signed)
A message was left, re: follow up visit. 

## 2018-08-22 ENCOUNTER — Ambulatory Visit: Payer: BLUE CROSS/BLUE SHIELD | Admitting: Podiatry

## 2018-08-22 ENCOUNTER — Other Ambulatory Visit: Payer: Self-pay

## 2018-08-22 VITALS — Temp 97.5°F

## 2018-08-22 DIAGNOSIS — M25572 Pain in left ankle and joints of left foot: Secondary | ICD-10-CM

## 2018-08-22 DIAGNOSIS — M778 Other enthesopathies, not elsewhere classified: Secondary | ICD-10-CM

## 2018-08-22 DIAGNOSIS — M779 Enthesopathy, unspecified: Secondary | ICD-10-CM

## 2018-08-22 DIAGNOSIS — R600 Localized edema: Secondary | ICD-10-CM | POA: Diagnosis not present

## 2018-08-23 ENCOUNTER — Telehealth: Payer: Self-pay | Admitting: *Deleted

## 2018-08-23 DIAGNOSIS — R600 Localized edema: Secondary | ICD-10-CM

## 2018-08-23 DIAGNOSIS — M778 Other enthesopathies, not elsewhere classified: Secondary | ICD-10-CM

## 2018-08-23 DIAGNOSIS — M25572 Pain in left ankle and joints of left foot: Secondary | ICD-10-CM

## 2018-08-23 NOTE — Telephone Encounter (Signed)
Faxed orders to [facility]. 

## 2018-08-23 NOTE — Telephone Encounter (Signed)
-----   Message from Julie Prince, DPM sent at 08/22/2018  3:22 PM EDT ----- Regarding: MRI left ankle Please order MRI left ankle.  Dx: chronic LT ankle pain and swelling.  Dr. Amalia Hailey

## 2018-08-27 NOTE — Progress Notes (Signed)
   HPI: 58 year old female presents the office today for follow-up evaluation of left foot pain with edema.  Patient states the pain has not improved.  She gets pain on the dorsal aspect of her foot that radiates into the first digit of the left foot.  Pain is currently 7/10.  Past Medical History:  Diagnosis Date  . Allergy   . Barrett esophagus   . Cataract    left cataract removal  . Essential hypertension, benign 09/12/2013  . GERD (gastroesophageal reflux disease)   . Hyperlipidemia 09/12/2013  . 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 Type 2 diabetes mellitus ([place]) 09/12/2013   Dr. Posey Pronto at [facility]      Physical Exam: General: The patient is alert and oriented x3 in no acute distress.  Dermatology: Skin is warm, dry and supple bilateral lower extremities. Negative for open lesions or macerations.  Vascular: Palpable pedal pulses bilaterally.  Heavy pitting edema noted to the foot and ankle left.  Edema does not extend more proximal than the ankle.  Capillary refill within normal limits.  Neurological: Epicritic and protective threshold grossly intact bilaterally.   Musculoskeletal Exam: Range of motion within normal limits to all pedal and ankle joints bilateral. Muscle strength 5/5 in all groups bilateral.  There is pain on palpation along the bases of the second, third, and fourth TMT joints.  Pain along the anterior aspect of the ankle as well.   Assessment: 1.  Left foot and ankle edema 2.  Capsulitis of left foot 3.  Pain in left foot   Plan of Care:  1. Patient evaluated. X-Rays reviewed.  2.  Today we will order an MRI left ankle 3.  Continue weightbearing in the cam boot as tolerated 4.  Return to clinic in 3 weeks to review MRI results     Edrick Kins, DPM [facility]  Dr. Edrick Kins, DPM    [address], [place] [address]                Office [phone]  Fax [phone]

## 2018-08-28 ENCOUNTER — Ambulatory Visit: Payer: BC Managed Care – PPO | Admitting: Family Medicine

## 2018-08-28 ENCOUNTER — Ambulatory Visit: Payer: BLUE CROSS/BLUE SHIELD | Admitting: Family Medicine

## 2018-08-28 ENCOUNTER — Encounter: Payer: Self-pay | Admitting: Family Medicine

## 2018-08-28 ENCOUNTER — Encounter (INDEPENDENT_AMBULATORY_CARE_PROVIDER_SITE_OTHER): Payer: BC Managed Care – PPO | Admitting: Family Medicine

## 2018-08-28 ENCOUNTER — Other Ambulatory Visit: Payer: Self-pay

## 2018-08-28 NOTE — Addendum Note (Signed)
Addended by: Lanier Prude D on: 08/28/2018 10:36 AM   Modules accepted: Orders

## 2018-09-05 ENCOUNTER — Other Ambulatory Visit: Payer: Self-pay

## 2018-09-05 ENCOUNTER — Encounter: Payer: Self-pay | Admitting: Family Medicine

## 2018-09-05 ENCOUNTER — Ambulatory Visit (INDEPENDENT_AMBULATORY_CARE_PROVIDER_SITE_OTHER): Payer: BC Managed Care – PPO | Admitting: Family Medicine

## 2018-09-05 VITALS — Ht 64.0 in | Wt 220.0 lb

## 2018-09-05 DIAGNOSIS — E1159 Type 2 diabetes mellitus with other circulatory complications: Secondary | ICD-10-CM

## 2018-09-05 DIAGNOSIS — I10 Essential (primary) hypertension: Secondary | ICD-10-CM

## 2018-09-05 DIAGNOSIS — E782 Mixed hyperlipidemia: Secondary | ICD-10-CM

## 2018-09-05 DIAGNOSIS — M659 Synovitis and tenosynovitis, unspecified: Secondary | ICD-10-CM | POA: Diagnosis not present

## 2018-09-05 DIAGNOSIS — I152 Hypertension secondary to endocrine disorders: Secondary | ICD-10-CM

## 2018-09-05 DIAGNOSIS — N183 Chronic kidney disease, stage 3 unspecified: Secondary | ICD-10-CM

## 2018-09-05 DIAGNOSIS — Z9114 Patient's other noncompliance with medication regimen: Secondary | ICD-10-CM

## 2018-09-05 DIAGNOSIS — Z6379 Other stressful life events affecting family and household: Secondary | ICD-10-CM | POA: Insufficient documentation

## 2018-09-05 DIAGNOSIS — E118 Type 2 diabetes mellitus with unspecified complications: Secondary | ICD-10-CM

## 2018-09-05 DIAGNOSIS — E1169 Type 2 diabetes mellitus with other specified complication: Secondary | ICD-10-CM

## 2018-09-05 DIAGNOSIS — E0822 Diabetes mellitus due to underlying condition with diabetic chronic kidney disease: Secondary | ICD-10-CM

## 2018-09-05 DIAGNOSIS — M79672 Pain in left foot: Secondary | ICD-10-CM

## 2018-09-05 MED ORDER — LOSARTAN POTASSIUM 50 MG PO TABS: 25.0000 mg | ORAL_TABLET | ORAL | 1 refills | Status: DC

## 2018-09-05 MED ORDER — ATORVASTATIN CALCIUM 40 MG PO TABS: 40.0000 mg | ORAL_TABLET | Freq: Every day | ORAL | 1 refills | Status: DC

## 2018-09-05 NOTE — Progress Notes (Signed)
Telehealth office visit note for Julie Prince, D.O- at [facility] Care at [facility]   I connected with current patient today and verified that I am speaking with the correct person using two identifiers.   . Location of the patient: Home . Location of the provider: Office Only the patient (+/- their family members at pt's discretion) and myself were participating in the encounter    - This visit type was conducted due to national recommendations for restrictions regarding the COVID-19 Pandemic (e.g. social distancing) in an effort to limit this patient's exposure and mitigate transmission in our community.  This format is felt to be most appropriate for this patient at this time.   - The patient did not have access to video technology or had technical difficulties with video requiring transitioning to audio format only. - No physical exam could be performed with this format, beyond that communicated to [name] by the patient/ family members as noted.   - Additionally my office staff/ schedulers discussed with the patient that there may be a monetary charge related to this service, depending on their medical insurance.   The patient expressed understanding, and agreed to proceed.       History of Present Illness:  Doing "ok", been busy.   - Dad passed away 08-13-22- in [place]- CVDx was 58yr old  - pt is the oldest out of 22 siblings- same Dad but different mothers- got to meet some for the first time which was a pleasure for pt-  So good came out of it. -  Now husband recently diagnosed prostate CA.  And brother in law- died of prostate ca last year---> they are worried about him to.   Obesity: Since pandemic- gained wt back , she is disgusted with self. Up 15lbs since Jan 2020  L foot swelling -  Going to Dr EAmalia Hailey- getting MRI soon.  Gave her a boot to wear recently to wear- helping alot  -Essentially today patient is really uncertain as to exactly what medicines she is taking  every day, and what the doses.  She is currently driving is difficult to hear her as well..   -Per cardiology's last note end of April they said to continue metoprolol 75 mg twice daily but patient did not state she was on this medicine today- and it was not on her med list. ( Her meds were [facility] with the [facility] as well as myself..   - I added metoprolol to her current medication list.)  DM:   Blood sugars are Occ high but overall very well controlled-  FBS 120-130's.  A1c:  6.0 - 3 mo ago;  9 mo  5.8-well-controlled.   - Patient admits to gaining some weight recently as well as not being is good with her diet and exercise regiment.  She does endorse taking her Ozempic weekly.  HTN:  140's-150's/ 80's   Highest was 170's- rare to be that high though.  Patient does not know what medicine she is taking.  There is no blood pressure medicine on her med list although she is under the care of cardiology for her significant cardiovascular risk as well as PAD.  They are treating her blood pressure and had her on metoprolol 75 twice daily  hyperlipidemia/cholesterol: Patient is not sure today if she is taking Lipitor every night or not.  It is on her med list and she does not know.  Last lipid panel as follows:  Lab Results  Component Value Date   CHOL 127 07/21/2017   HDL 35 (L) 07/21/2017   LDLCALC 72 07/21/2017   TRIG 102 07/21/2017   CHOLHDL 3.6 07/21/2017    Hepatic Function Latest Ref Rng & Units 07/21/2017 04/26/2016 03/01/2016  Total Protein 6.0 - 8.5 g/dL 6.6 7.0 6.3  Albumin 3.5 - 5.5 g/dL 3.6 3.8 3.6  AST 0 - 40 IU/L 5 8(L) 9(L)  ALT 0 - 32 IU/L '9 8 6  '$ Alk Phosphatase 39 - 117 IU/L 94 81 78  Total Bilirubin 0.0 - 1.2 mg/dL 0.4 0.4 0.4      Impression and Recommendations:    1. (Currently) Controlled diabetes mellitus type 2 with complications, unspecified whether long term insulin use ([place])   2. Hypertension associated with diabetes ([place])   3. Mixed diabetic hyperlipidemia  associated with type 2 diabetes mellitus ([place])   4. Tenosynovitis of foot   5. Foot pain, left   6. Obesity, Class III, morbid obesity  ([place])   7. Diabetes mellitus due to underlying condition with stage 3 chronic kidney disease, without long-term current use of insulin ([place])   8. Stress due to illness of family member   71. H/O medication noncompliance     -Patient will get home, review all the medicine she is currently on and then call back and let Melissa know exactly what she is taking and how much.  -Hypertension associated with diabetes:  Since she may or may not be on a blood pressure medicine, she is not on ACE or an arm due to her diabetes, we will start low-dose losartan today  - Hyperlipidemia associated with diabetes: Patient is unaware of exactly what she is taking for a cholesterol medication.  She will check at home and let us know.  She should be taking 40 of Lipitor nightly.  I sent in this new prescription for her per the medical records.  Patient has long history of noncompliance with medication treatment and regiment: -We discussed how patient cannot slip back into her old habits and continue weight gain, not understanding medication she is on, not eating healthy and not exercising.  We discussed importance of her getting back into these healthier habits and focused a little more on her health and this overall will help with her stress.  Familial stress: -Patient denies that she needs any medications or help at this time.  -It is been over a year since patient had a full set of fasting blood work or lipid panel.  Told patient since she lives far away, next office visit in 3 months she will come in for morning a.m. visit and we will get full set of fasting blood work.  Also would be follow-up to see how she is doing on low-dose losartan.  - As part of my medical decision making, I reviewed the following data within the [place] History obtained from pt  /family, CMA notes reviewed and incorporated if applicable, Labs reviewed, Radiograph/ tests reviewed if applicable and OV notes from prior OV's with me, as well as other specialists she/he has seen since seeing me last, were all reviewed and used in my medical decision making process today.   - Additionally, discussion had with patient regarding txmnt plan, and their biases/concerns about that plan were used in my medical decision making today.   - The patient agreed with the plan and demonstrated an understanding of the instructions.   No barriers to understanding were identified.   -  Red flag symptoms and signs discussed in detail.  Patient expressed understanding regarding what to do in case of emergency\ urgent symptoms.  The patient was advised to call back or seek an in-person evaluation if the symptoms worsen or if the condition fails to improve as anticipated.   Return for 2) 3 mo fupBP,DM,chol:(started losartan)- needs FBW- so am appt.    Meds ordered this encounter  Medications  . losartan (COZAAR) 50 MG tablet    Sig: Take 0.5 tablets (25 mg total) by mouth every morning.    Dispense:  45 tablet    Refill:  1  . atorvastatin (LIPITOR) 40 MG tablet    Sig: Take 1 tablet (40 mg total) by mouth at bedtime.    Dispense:  90 tablet    Refill:  1    Medications Discontinued During This Encounter  Medication Reason  . atorvastatin (LIPITOR) 40 MG tablet Reorder    I provided 28+ minutes of non-face-to-face time during this encounter,with over 50% of the time in direct counseling on patients medical conditions/ medical concerns.  Additional time was spent with charting and coordination of care after the actual visit commenced.   Note:  This note was prepared with assistance of Dragon voice recognition software. Occasional wrong-word or sound-a-like substitutions may have occurred due to the inherent limitations of voice recognition software.  Julie Dance, DO Julie Dance, DO  10/26/2018 2:38 PM    Patient Care Team    Relationship Specialty Notifications Start End  Julie Dance, DO PCP - General Family Medicine  07/12/17   Skeet Latch, MD PCP - Cardiology Cardiology Admissions 05/29/18   Pyrtle, Lajuan Lines, MD Consulting Physician Gastroenterology  07/13/17   Delrae Rend, MD Consulting Physician Endocrinology  07/13/17   Emily Filbert, MD Consulting Physician Obstetrics and Gynecology  07/13/17   Christella Hartigan, RD Dietitian Dietician  09/05/17      -Vitals obtained; medications/ allergies reconciled;  personal medical, social, Sx etc.histories were updated by CMA, reviewed by me and are reflected in chart   Patient Active Problem List   Diagnosis Date Noted  . Diabetes mellitus due to underlying condition with stage 3 chronic kidney disease, without long-term current use of insulin ([place]) 07/12/2017    Priority: High  . Mixed diabetic hyperlipidemia associated with type 2 diabetes mellitus ([place]) 07/12/2017    Priority: High  . Hypertension associated with diabetes ([place]) 07/12/2017    Priority: High  . Obesity, Class III, morbid obesity  ([place]) 07/12/2017    Priority: High  . Currently controlled diabetes mellitus type 2 with complications ([place]) 37/16/9678    Priority: High  . PAD (peripheral artery disease) ([name])-  See's Cards- Dr Elaina Pattee [place]/ that group for this condition 05/28/2018    Priority: Medium  . Diabetic retinopathy associated with diabetes mellitus due to underlying condition ([place]) 04/20/2018    Priority: Medium  . Tachycardia with hypertension 03/01/2016    Priority: Medium  . Diabetic peripheral neuropathy associated with type 2 diabetes mellitus ([place]) 09/12/2013    Priority: Medium  . H/O medication noncompliance 07/12/2017    Priority: Low  . Barrett's esophagus 03/11/2016    Priority: Low  . 1st degree AV block 03/01/2016    Priority: Low  . Stress due to illness of family member 09/05/2018  . Foot pain, left 04/20/2018   . Trigger ring finger 04/20/2018  . 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 Dysphagia 02/10/2016  . Vulvar abscess   . Sepsis ([place])  06/21/2015  . GERD (gastroesophageal reflux disease) 07/31/2014  . Adenomatous polyp of colon 04/03/2014  . Allergic rhinitis 01/23/2014  . CTS (carpal tunnel syndrome) 10/03/2013  . Uncontrolled type 2 diabetes mellitus with hyperglycemia ([place]) 10/03/2013  . Low back pain 10/03/2013  . Lumbar arthropathy 10/03/2013  . Lumbar degenerative disc disease 10/03/2013  . Neuropathy 10/03/2013  . Osteoarthritis 10/03/2013  . 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 Tenosynovitis of foot 10/03/2013  . Hyperlipidemia 09/12/2013  . Essential hypertension, benign 09/12/2013     Current Meds  Medication Sig  . aspirin 81 MG chewable tablet Chew by mouth daily.  Mar[place] Kitchen atorvastatin (LIPITOR) 40 MG tablet Take 1 tablet (40 mg total) by mouth at bedtime.  . cilostazol (PLETAL) 50 MG tablet Take 1 tablet (50 mg total) by mouth 2 (two) times daily.  . Continuous Blood Gluc Sensor (FREESTYLE LIBRE 14 DAY SENSOR) MISC TEST DAILY AS DIRECTED AND CHANGE EVERY 14 DAYS  . Semaglutide, 1 MG/DOSE, (OZEMPIC, 1 MG/DOSE,) 2 MG/1.5ML SOPN Inject 1 mg into the skin once a week.  . [DISCONTINUED] atorvastatin (LIPITOR) 40 MG tablet Take 40 mg by mouth at bedtime.  . [DISCONTINUED] metoprolol tartrate (LOPRESSOR) 50 MG tablet Take 75 mg by mouth 2 (two) times daily.   Current Facility-Administered Medications for the 09/05/18 encounter (Office Visit) with Julie Dance, DO  Medication  . 0.9 %  sodium chloride infusion     Allergies:  Allergies  Allergen Reactions  . Penicillins Hives and Shortness Of Breath  . Metformin And Related Diarrhea     ROS:  See above HPI for pertinent positives and negatives   Objective:   Height '5\' 4"'$  (1.626 m), weight 220 lb (99.8 kg).  (if some vitals are omitted, this means that patient was UNABLE to obtain them even though they were  asked to get them prior to OV today.  They were asked to call us at their earliest convenience with these once obtained. )  General: A & O * 3; sounds in no acute distress; in usual state of health.  Skin: Pt confirms warm and dry extremities and pink fingertips HEENT: Pt confirms lips non-cyanotic Chest: Patient confirms normal chest excursion and movement Respiratory: speaking in full sentences, no conversational dyspnea; patient confirms no use of accessory muscles Psych: insight appears good, mood- appears full

## 2018-09-13 ENCOUNTER — Ambulatory Visit
Admission: RE | Admit: 2018-09-13 | Discharge: 2018-09-13 | Disposition: A | Discharge status: Home / Self Care | Payer: BC Managed Care – PPO | Source: Ambulatory Visit | Attending: Podiatry | Admitting: Podiatry

## 2018-09-26 ENCOUNTER — Ambulatory Visit: Payer: BLUE CROSS/BLUE SHIELD | Admitting: Podiatry

## 2018-09-26 ENCOUNTER — Encounter: Payer: Self-pay | Admitting: Podiatry

## 2018-09-26 ENCOUNTER — Other Ambulatory Visit: Payer: Self-pay

## 2018-09-26 VITALS — Temp 97.8°F

## 2018-09-26 DIAGNOSIS — R600 Localized edema: Secondary | ICD-10-CM

## 2018-09-26 DIAGNOSIS — M778 Other enthesopathies, not elsewhere classified: Secondary | ICD-10-CM

## 2018-09-26 DIAGNOSIS — M779 Enthesopathy, unspecified: Secondary | ICD-10-CM | POA: Diagnosis not present

## 2018-09-26 MED ORDER — MELOXICAM 15 MG PO TABS: 15.0000 mg | ORAL_TABLET | Freq: Every day | ORAL | 1 refills | Status: DC

## 2018-09-30 NOTE — Progress Notes (Signed)
   HPI: 58 year old female presents the office today for follow-up evaluation of left foot pain with edema. She states the pain is relatively unchanged. She has been using the CAM boot as directed. Walking and standing for long periods of time increases the pain. Patient is here for further evaluation and treatment.   Past Medical History:  Diagnosis Date  . Allergy   . Barrett esophagus   . Cataract    left cataract removal  . Essential hypertension, benign 09/12/2013  . GERD (gastroesophageal reflux disease)   . Hyperlipidemia 09/12/2013  . 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 Type 2 diabetes mellitus ([place]) 09/12/2013   Dr. Posey Pronto at [facility]      Physical Exam: General: The patient is alert and oriented x3 in no acute distress.  Dermatology: Skin is warm, dry and supple bilateral lower extremities. Negative for open lesions or macerations.  Vascular: Palpable pedal pulses bilaterally.  Heavy pitting edema noted to the foot and ankle left.  Edema does not extend more proximal than the ankle.  Capillary refill within normal limits.  Neurological: Epicritic and protective threshold grossly intact bilaterally.   Musculoskeletal Exam: Range of motion within normal limits to all pedal and ankle joints bilateral. Muscle strength 5/5 in all groups bilateral.  There is pain on palpation along the bases of the second, third, and fourth TMT joints.  Pain along the anterior aspect of the ankle as well.  MRI Impression:  Marrow edema in the distal calcaneus, throughout the midfoot and in the visualized second and third metatarsals is likely due to stress change. No fracture is identified.  Pes planus.  Plantar fasciitis without rupture.  Negative for tendon or ligament tear.  Assessment: 1. Idiopathic edema left foot 2. Capsulitis left midfoot   Plan of Care:  1. Patient evaluated. MRI reviewed.  2. Unna boot multilayer compression applied LLE.  3. Prescription  for Meloxicam provided to patient. 4. Return to clinic in one week.    Edrick Kins, DPM [facility]  Dr. Edrick Kins, DPM    [address], [place] [address]                Office [phone]  Fax [phone]

## 2018-10-02 ENCOUNTER — Telehealth: Payer: Self-pay | Admitting: *Deleted

## 2018-10-02 NOTE — Telephone Encounter (Signed)
A message was left, re: follow up visit. 

## 2018-10-03 ENCOUNTER — Ambulatory Visit: Payer: BC Managed Care – PPO | Admitting: Podiatry

## 2018-10-03 ENCOUNTER — Encounter: Payer: Self-pay | Admitting: Podiatry

## 2018-10-03 ENCOUNTER — Other Ambulatory Visit: Payer: Self-pay

## 2018-10-03 VITALS — Temp 98.4°F

## 2018-10-03 DIAGNOSIS — M779 Enthesopathy, unspecified: Secondary | ICD-10-CM

## 2018-10-03 DIAGNOSIS — R600 Localized edema: Secondary | ICD-10-CM

## 2018-10-03 DIAGNOSIS — M778 Other enthesopathies, not elsewhere classified: Secondary | ICD-10-CM

## 2018-10-07 NOTE — Progress Notes (Signed)
   HPI: 58 year old female presenting to the office today for follow up evaluation of left foot pain and swelling. She states she is improving and the swelling has decreased. She states the pain is improving as well. She has had the unna boot in place and states it has been helping alleviate the symptoms. There are no worsening factors noted. Patient is here for further evaluation and treatment.   Past Medical History:  Diagnosis Date  . Allergy   . Barrett esophagus   . Cataract    left cataract removal  . Essential hypertension, benign 09/12/2013  . GERD (gastroesophageal reflux disease)   . Hyperlipidemia 09/12/2013  . 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 Type 2 diabetes mellitus ([place]) 09/12/2013   Dr. Posey Pronto at [facility]      Physical Exam: General: The patient is alert and oriented x3 in no acute distress.  Dermatology: Skin is warm, dry and supple bilateral lower extremities. Negative for open lesions or macerations.  Vascular: Palpable pedal pulses bilaterally.  Heavy pitting edema noted to the foot and ankle left.  Edema does not extend more proximal than the ankle.  Capillary refill within normal limits.  Neurological: Epicritic and protective threshold grossly intact bilaterally.   Musculoskeletal Exam: Range of motion within normal limits to all pedal and ankle joints bilateral. Muscle strength 5/5 in all groups bilateral.  There is pain on palpation along the bases of the second, third, and fourth TMT joints.  Pain along the anterior aspect of the ankle as well.  MRI Impression:  Marrow edema in the distal calcaneus, throughout the midfoot and in the visualized second and third metatarsals is likely due to stress change. No fracture is identified.  Pes planus.  Plantar fasciitis without rupture.  Negative for tendon or ligament tear.  Assessment: 1. Idiopathic edema left foot - improved 2. Capsulitis left midfoot   Plan of Care:  1. Patient  evaluated. MRI reviewed.  2. Compression anklet dispensed.  3. Continue using CAM boot. Weightbearing as tolerated.  4. Return to clinic in 3 weeks. We may reapply unna boot if swelling occurs.    Edrick Kins, DPM [facility]  Dr. Edrick Kins, DPM    [address], [place] [address]                Office [phone]  Fax [phone]

## 2018-10-14 ENCOUNTER — Other Ambulatory Visit: Payer: Self-pay | Admitting: Physician Assistant

## 2018-10-29 ENCOUNTER — Encounter: Payer: Self-pay | Admitting: Podiatry

## 2018-10-29 ENCOUNTER — Other Ambulatory Visit: Payer: Self-pay

## 2018-10-29 ENCOUNTER — Ambulatory Visit: Payer: BC Managed Care – PPO | Admitting: Podiatry

## 2018-10-29 VITALS — Temp 97.4°F

## 2018-10-29 DIAGNOSIS — M778 Other enthesopathies, not elsewhere classified: Secondary | ICD-10-CM

## 2018-10-29 DIAGNOSIS — M779 Enthesopathy, unspecified: Secondary | ICD-10-CM

## 2018-10-29 DIAGNOSIS — R600 Localized edema: Secondary | ICD-10-CM

## 2018-10-31 NOTE — Progress Notes (Signed)
   HPI: 58 year old female presenting to the office today for follow up evaluation of left foot pain and swelling. She states she is doing better and her pain is improving. She has been using the CAM boot as directed which has helped alleviate her symptoms. There are no worsening factors noted. Patient is here for further evaluation and treatment.   Past Medical History:  Diagnosis Date  . Allergy   . Barrett esophagus   . Cataract    left cataract removal  . Essential hypertension, benign 09/12/2013  . GERD (gastroesophageal reflux disease)   . Hyperlipidemia 09/12/2013  . 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 Type 2 diabetes mellitus ([place]) 09/12/2013   Dr. Posey Pronto at [facility]      Physical Exam: General: The patient is alert and oriented x3 in no acute distress.  Dermatology: Skin is warm, dry and supple bilateral lower extremities. Negative for open lesions or macerations.  Vascular: Palpable pedal pulses bilaterally.  Heavy pitting edema noted to the foot and ankle left.  Edema does not extend more proximal than the ankle.  Capillary refill within normal limits.  Neurological: Epicritic and protective threshold grossly intact bilaterally.   Musculoskeletal Exam: Range of motion within normal limits to all pedal and ankle joints bilateral. Muscle strength 5/5 in all groups bilateral.  There is pain on palpation along the bases of the second, third, and fourth TMT joints.  Pain along the anterior aspect of the ankle as well.  MRI Impression:  Marrow edema in the distal calcaneus, throughout the midfoot and in the visualized second and third metatarsals is likely due to stress change. No fracture is identified.  Pes planus.  Plantar fasciitis without rupture.  Negative for tendon or ligament tear.  Assessment: 1. Idiopathic edema left foot - improved 2. Capsulitis left midfoot - improved    Plan of Care:  1. Patient evaluated. MRI reviewed.  2. Continue  using CAM boot for four weeks.  3. Continue using compression anklet.  4. Return to clinic in 4 weeks.      Edrick Kins, DPM [facility]  Dr. Edrick Kins, DPM    [address], [place] [address]                Office [phone]  Fax [phone]

## 2018-11-28 ENCOUNTER — Ambulatory Visit: Payer: BC Managed Care – PPO | Admitting: Podiatry

## 2018-12-06 ENCOUNTER — Ambulatory Visit: Payer: BC Managed Care – PPO | Admitting: Family Medicine

## 2018-12-12 ENCOUNTER — Telehealth: Payer: Self-pay | Admitting: *Deleted

## 2018-12-12 ENCOUNTER — Other Ambulatory Visit: Payer: Self-pay

## 2018-12-12 ENCOUNTER — Ambulatory Visit (INDEPENDENT_AMBULATORY_CARE_PROVIDER_SITE_OTHER): Payer: BC Managed Care – PPO

## 2018-12-12 ENCOUNTER — Ambulatory Visit (INDEPENDENT_AMBULATORY_CARE_PROVIDER_SITE_OTHER): Payer: BC Managed Care – PPO | Admitting: Podiatry

## 2018-12-12 DIAGNOSIS — M779 Enthesopathy, unspecified: Secondary | ICD-10-CM

## 2018-12-12 DIAGNOSIS — M25572 Pain in left ankle and joints of left foot: Secondary | ICD-10-CM

## 2018-12-12 DIAGNOSIS — M778 Other enthesopathies, not elsewhere classified: Secondary | ICD-10-CM

## 2018-12-12 DIAGNOSIS — R600 Localized edema: Secondary | ICD-10-CM

## 2018-12-12 NOTE — Telephone Encounter (Signed)
Pt states has an appt with Dr. Amalia Hailey today at 10:45am and she needs her dtr, Bettey Mare to come in the room because she is her caregiver.

## 2018-12-16 NOTE — Progress Notes (Signed)
   HPI: 58 year old female presenting to the office today for follow up evaluation of left foot pain and swelling. She states her pain has improved but she is still experiencing concerning swelling of the left ankle. She has been using the compression anklet but states it is not helping. She has been using the CAM boot which she states helps to alleviate the symptoms. Patient is here for further evaluation and treatment.   Past Medical History:  Diagnosis Date  . Allergy   . Barrett esophagus   . Cataract    left cataract removal  . Essential hypertension, benign 09/12/2013  . GERD (gastroesophageal reflux disease)   . Hyperlipidemia 09/12/2013  . 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 Type 2 diabetes mellitus ([place]) 09/12/2013   Dr. Posey Pronto at [facility]      Physical Exam: General: The patient is alert and oriented x3 in no acute distress.  Dermatology: Skin is warm, dry and supple bilateral lower extremities. Negative for open lesions or macerations.  Vascular: Heavy pitting edema noted to the foot and ankle left. Edema does not extend more proximal than the ankle. Palpable pedal pulses bilaterally. Capillary refill within normal limits.  Neurological: Epicritic and protective threshold grossly intact bilaterally.   Musculoskeletal Exam: Range of motion within normal limits to all pedal and ankle joints bilateral. Muscle strength 5/5 in all groups bilateral.  There is pain on palpation along the bases of the second, third, and fourth TMT joints.  Pain along the anterior aspect of the ankle as well.  Radiographic Exam:  Normal osseous mineralization. Joint spaces preserved. No fracture/dislocation/boney destruction. No change in radiographs compared to prior X-Rays.    Assessment: 1. Idiopathic edema left foot 2. Capsulitis left midfoot - improved    Plan of Care:  1. Patient evaluated. X-Rays reviewed.  2. Unna boot applied. Keep clean, dry and intact for one  week then resume using compression anklet.  3. Continue using CAM boot.  4. Return to clinic in 3 weeks.       Edrick Kins, DPM [facility]  Dr. Edrick Kins, DPM    [address], [place] [address]                Office [phone]  Fax [phone]

## 2018-12-18 ENCOUNTER — Encounter: Payer: Self-pay | Admitting: Cardiovascular Disease

## 2018-12-18 ENCOUNTER — Ambulatory Visit: Payer: BC Managed Care – PPO | Admitting: Cardiovascular Disease

## 2018-12-18 ENCOUNTER — Other Ambulatory Visit: Payer: Self-pay

## 2018-12-18 VITALS — BP 140/79 | HR 120 | Temp 96.8°F | Ht 64.0 in | Wt 218.0 lb

## 2018-12-18 DIAGNOSIS — I739 Peripheral vascular disease, unspecified: Secondary | ICD-10-CM | POA: Diagnosis not present

## 2018-12-18 DIAGNOSIS — I10 Essential (primary) hypertension: Secondary | ICD-10-CM | POA: Diagnosis not present

## 2018-12-18 DIAGNOSIS — E785 Hyperlipidemia, unspecified: Secondary | ICD-10-CM

## 2018-12-18 DIAGNOSIS — E119 Type 2 diabetes mellitus without complications: Secondary | ICD-10-CM

## 2018-12-18 DIAGNOSIS — R000 Tachycardia, unspecified: Secondary | ICD-10-CM

## 2018-12-18 MED ORDER — METOPROLOL TARTRATE 50 MG PO TABS: 75.0000 mg | ORAL_TABLET | Freq: Two times a day (BID) | ORAL | 1 refills | Status: DC

## 2018-12-18 NOTE — Progress Notes (Signed)
Cardiology Office Note   Date:  12/18/2018   ID:  Julie Prince, DOB 1960/07/03, MRN [id number]  PCP:  Mellody Dance, DO  Cardiologist: Dr. Oval Linsey  No chief complaint on file.     History of Present Illness: Julie Prince is a 58 y.o. female who is here today for follow-up visit regarding peripheral arterial disease.   She has history of inappropriate sinus tachycardia, diabetes and hyperlipidemia. She is a previous smoker and quit more than 20 years ago. She was seen earlier this year for bilateral calf claudication.  Noninvasive vascular evaluation  showed moderately reduced ABI but nonreliable due to medial calcifications.  Duplex showed bilateral SFA occlusion with one-vessel runoff below the knee. Angiography in March showed no significant aortoiliac disease, on the left, there was flush occlusion of the SFA with reconstitution via extensive collaterals from the profunda with two-vessel runoff below the knee.  On the right side, there was diffuse disease in the proximal portion of the SFA with total occlusion in the midsegment with reconstitution distally via extensive collaterals and one-vessel runoff below the knee.  She was started on small dose cilostazol.   She reports no claudication at the present time and there is no lower extremity ulceration.  She was diagnosed with a fractured bone in her left foot which is being treated with immobilization.  She continues to be tachycardic but denies palpitation, chest pain or shortness of breath.    Past Medical History:  Diagnosis Date  . Allergy   . Barrett esophagus   . Cataract    left cataract removal  . Essential hypertension, benign 09/12/2013  . GERD (gastroesophageal reflux disease)   . Hyperlipidemia 09/12/2013  . 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 Type 2 diabetes mellitus ([place]) 09/12/2013   Dr. Posey Pronto at [place]     Past Surgical History:  Procedure Laterality Date  . ABDOMINAL  AORTOGRAM W/LOWER EXTREMITY N/A 06/06/2018   Procedure: ABDOMINAL AORTOGRAM W/LOWER EXTREMITY;  Surgeon: Wellington Hampshire, MD;  Location: [facility];  Service: Cardiovascular;  Laterality: N/A;  . APPENDECTOMY    . CATARACT EXTRACTION Left   . CESAREAN SECTION  1990  . COLONOSCOPY    . right tube and ovary removed    . WISDOM TOOTH EXTRACTION       Current Outpatient Medications  Medication Sig Dispense Refill  . aspirin 81 MG chewable tablet Chew by mouth daily.    Marland Kitchen atorvastatin (LIPITOR) 40 MG tablet Take 1 tablet (40 mg total) by mouth at bedtime. 90 tablet 1  . cilostazol (PLETAL) 50 MG tablet Take 1 tablet (50 mg total) by mouth 2 (two) times daily. 60 tablet 4  . Continuous Blood Gluc Sensor (FREESTYLE LIBRE 14 DAY SENSOR) MISC TEST DAILY AS DIRECTED AND CHANGE EVERY 14 DAYS    . losartan (COZAAR) 25 MG tablet TAKE 1 TABLET (25 MG) EVERY MORNING    . losartan (COZAAR) 50 MG tablet Take 0.5 tablets (25 mg total) by mouth every morning. 45 tablet 1  . meloxicam (MOBIC) 15 MG tablet Take 1 tablet (15 mg total) by mouth daily. 30 tablet 1  . metoprolol tartrate (LOPRESSOR) 50 MG tablet TAKE ONE AND A HALF TABLETS BY MOUTH TWICE A DAY FOR 30 DAYS 270 tablet 1  . Semaglutide, 1 MG/DOSE, (OZEMPIC, 1 MG/DOSE,) 2 MG/1.5ML SOPN Inject 1 mg into the skin once a week. 18 pen 1  . TRESIBA FLEXTOUCH 100 UNIT/ML SOPN FlexTouch Pen INJECT 50 UNITS  SUBCUTANEOUS AT BEDTIME     Current Facility-Administered Medications  Medication Dose Route Frequency Provider Last Rate Last Dose  . 0.9 %  sodium chloride infusion  500 mL Intravenous Once Pyrtle, Lajuan Lines, MD        Allergies:   Penicillins and Metformin and related    Social History:  The patient  reports that she has quit smoking. She smoked 0.50 packs per day. She has never used smokeless tobacco. She reports that she does not drink alcohol or use drugs.   Family History:  The patient's family history includes Alzheimer's disease in her  maternal grandmother; Fibromyalgia in her sister; Heart disease in her maternal aunt; Lung cancer in her mother; Stroke in her father.    ROS:  Please see the history of present illness.   Otherwise, review of systems are positive for none.   All other systems are reviewed and negative.    PHYSICAL EXAM: VS:  BP 140/79   Pulse (!) 120   Temp (!) 96.8 F (36 C)   Ht 5\' 4"  (1.626 m)   Wt 218 lb (98.9 kg)   SpO2 98%   BMI 37.42 kg/m  , BMI Body mass index is 37.42 kg/m. GEN: Well nourished, well developed, in no acute distress  HEENT: normal  Neck: no JVD, carotid bruits, or masses Cardiac: RRR; no murmurs, rubs, or gallops,no edema  Respiratory:  clear to auscultation bilaterally, normal work of breathing GI: soft, nontender, nondistended, + BS MS: no deformity or atrophy  Skin: warm and dry, no rash Neuro:  Strength and sensation are intact Psych: euthymic mood, full affect   EKG:  EKG is ordered today. EKG showed sinus versus ectopic atrial tachycardia with possible old anterior and inferior infarct.  No significant change from before.  Recent Labs: 04/11/2018: TSH 2.080 05/22/2018: BUN 7; Creatinine, Ser 0.79; Hemoglobin 13.7; Platelets 293; Potassium 4.5; Sodium 142    Lipid Panel    Component Value Date/Time   CHOL 127 07/21/2017 0829   TRIG 102 07/21/2017 0829   HDL 35 (L) 07/21/2017 0829   CHOLHDL 3.6 07/21/2017 0829   CHOLHDL 6.4 (H) 04/26/2016 1412   VLDL 32 (H) 04/26/2016 1412   LDLCALC 72 07/21/2017 0829      Wt Readings from Last 3 Encounters:  12/18/18 218 lb (98.9 kg)  09/05/18 220 lb (99.8 kg)  08/28/18 220 lb (99.8 kg)        PAD Screen 05/22/2018  Previous PAD dx? No  Previous surgical procedure? No  Pain with walking? Yes  Subsides with rest? No  Feet/toe relief with dangling? No  Painful, non-healing ulcers? No  Extremities discolored? No      ASSESSMENT AND PLAN:  1.  Peripheral arterial disease with bilateral leg claudication due  to bilateral SFA occlusion with extensive collaterals.  She reports no further claudication since she was started on cilostazol.  Continue medical therapy.  2.  Essential hypertension: Continue metoprolol and losartan  3.  Hyperlipidemia: Continue treatment with atorvastatin with a target LDL of less than 70.  Most recent lipid profile showed an LDL of 72.  4.  Diabetes mellitus: Most recent hemoglobin A1c improved significantly to 5.8 from 10.  5.  Inappropriate sinus tachycardia versus ectopic atrial tachycardia: She continues to be tachycardic.  I increase metoprolol to 75 mg twice daily.  We might have to ultimately increased to 100 mg twice daily.    Disposition:   FU with me in 6 months  Signed,  [name], MD  12/18/2018 10:40 AM    [place]

## 2018-12-18 NOTE — Patient Instructions (Signed)
Medication Instructions:  Metoprolol: Take 75 mg twice daily (one and a half tablets)  If you need a refill on your cardiac medications before your next appointment, please call your pharmacy.   Lab work: None ordered If you have labs (blood work) drawn today and your tests are completely normal, you will receive your results only by: Marland Kitchen MyChart Message (if you have MyChart) OR . A paper copy in the mail If you have any lab test that is abnormal or we need to change your treatment, we will call you to review the results.  Testing/Procedures: None ordered  Follow-Up: At [facility], you and your health needs are our priority.  As part of our continuing mission to provide you with exceptional heart care, we have created designated Provider Care Teams.  These Care Teams include your primary Cardiologist (physician) and Advanced Practice Providers (APPs -  Physician Assistants and Nurse Practitioners) who all work together to provide you with the care you need, when you need it. You will need a follow up appointment in 6 months.  Please call our office 2 months in advance to schedule this appointment.  You may see [name], MD or one of the following Advanced Practice Providers on your designated Care Team:   Kerin Ransom, PA-C Roby Lofts, [place] . Sande Rives, PA-C

## 2018-12-19 ENCOUNTER — Encounter: Payer: Self-pay | Admitting: Family Medicine

## 2018-12-19 ENCOUNTER — Ambulatory Visit (INDEPENDENT_AMBULATORY_CARE_PROVIDER_SITE_OTHER): Payer: BC Managed Care – PPO | Admitting: Family Medicine

## 2018-12-19 DIAGNOSIS — M65349 Trigger finger, unspecified ring finger: Secondary | ICD-10-CM | POA: Diagnosis not present

## 2018-12-19 NOTE — Patient Instructions (Signed)
Try the band-aid trick as shown definitely while sleeping but as often as you need to if this bothers you during the day. If this continues to be a problem though let me know - next step would be to see the hand surgeon since these have been injected twice and it's come back. Otherwise follow up with me as needed.

## 2018-12-19 NOTE — Progress Notes (Signed)
PCP: Mellody Dance, DO  Subjective:   CC: Patient is a 58 y.o. female here for recurrent trigger finger.  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  Patient's last steroid injection was January of this year and had great resolution of symptoms until 2 weeks ago.  Patient woke up from sleep with bilateral fourth digits in a locked flexed position at the PIPs.  Patient states that she has not tried any other interventions to help with this trigger finger since recurrence and has resolved spontaneously for the most part.  Patient initially had severe swelling of these joints and was almost unable to get her rings off.  Patient denies any pain to the joints or any erythema.  Patient otherwise feels well.  She currently has no pain and has full range of motion in both fingers. Patient is interested in repeating her steroid injections today and is open to getting surgery if this continues to recur so she would like to initiate contact with the hand surgeon in preparation for that decision in the future.  ROS: See HPI above. I have personally reviewed pertinent past medical history, surgical, family, and social history as appropriate.     Objective:  BP 120/82   Ht 5\' 4"  (1.626 m)   Wt 215 lb (97.5 kg)   BMI 36.90 kg/m   Physical Exam: Gen: NAD, comfortable in exam room Hands: Observation: Patient has mild fusiform swelling of joints diffusely.  Negative for Heberden's deformity or ulnar drift.  No swelling, bruising, malrotation or angulation.  No instability of digits. Palpation: Negative for tenderness over MCP, PIP, DIP of affected digits.  Negative for palpation of flexor or extensor tendons of the same digit.  No other tenderness of wrist or hand. ROM: Patient has full painless passive and active flexion extension of fourth digit. Strength: Full and symmetrical strength in flexion and extension of fourth digit  bilaterally Sensation: Intact bilaterally Collateral ligaments intact of PIP, DIP joints.   Assessment & Plan:  Trigger ring finger Recurrence of trigger fingers but asymptomatic today. -Provided patient with reassurance today -Described Band-Aid splinting technique to prevent flexion of finger and exacerbation of locking -Discussed with patient the possibility of referral to hand surgeon if she continues to have difficulties with this given she has had two injections of these bilaterally and has had recurrence -Return as needed  I independently examined pertinent imaging in relation to cc.  Doristine Mango, [facility] PGY-2

## 2018-12-19 NOTE — Assessment & Plan Note (Signed)
Recurrence of trigger finger and asymptomatic today. -Provided patient with reassurance today -Described Band-Aid splinting technique to prevent flexion of finger and exacerbation of locking -Discussed with patient the possibility of referral to hand surgeon if she continues to have difficulties with this -Return as needed

## 2018-12-24 NOTE — Addendum Note (Signed)
Addended by: Zebedee Iba on: 12/24/2018 08:19 AM   Modules accepted: Orders

## 2018-12-26 ENCOUNTER — Encounter: Payer: Self-pay | Admitting: Family Medicine

## 2018-12-26 ENCOUNTER — Other Ambulatory Visit: Payer: Self-pay | Admitting: Cardiovascular Disease

## 2018-12-26 ENCOUNTER — Ambulatory Visit (INDEPENDENT_AMBULATORY_CARE_PROVIDER_SITE_OTHER): Payer: BC Managed Care – PPO | Admitting: Family Medicine

## 2018-12-26 ENCOUNTER — Other Ambulatory Visit: Payer: Self-pay

## 2018-12-26 VITALS — BP 170/89 | HR 82 | Ht 64.0 in | Wt 215.0 lb

## 2018-12-26 DIAGNOSIS — E0822 Diabetes mellitus due to underlying condition with diabetic chronic kidney disease: Secondary | ICD-10-CM | POA: Diagnosis not present

## 2018-12-26 DIAGNOSIS — E1159 Type 2 diabetes mellitus with other circulatory complications: Secondary | ICD-10-CM

## 2018-12-26 DIAGNOSIS — E349 Endocrine disorder, unspecified: Secondary | ICD-10-CM

## 2018-12-26 DIAGNOSIS — I739 Peripheral vascular disease, unspecified: Secondary | ICD-10-CM

## 2018-12-26 DIAGNOSIS — I10 Essential (primary) hypertension: Secondary | ICD-10-CM

## 2018-12-26 DIAGNOSIS — E08319 Diabetes mellitus due to underlying condition with unspecified diabetic retinopathy without macular edema: Secondary | ICD-10-CM

## 2018-12-26 DIAGNOSIS — E782 Mixed hyperlipidemia: Secondary | ICD-10-CM

## 2018-12-26 DIAGNOSIS — E66813 Obesity, class 3: Secondary | ICD-10-CM

## 2018-12-26 DIAGNOSIS — E118 Type 2 diabetes mellitus with unspecified complications: Secondary | ICD-10-CM

## 2018-12-26 DIAGNOSIS — N183 Chronic kidney disease, stage 3 unspecified: Secondary | ICD-10-CM

## 2018-12-26 DIAGNOSIS — Z9114 Patient's other noncompliance with medication regimen: Secondary | ICD-10-CM

## 2018-12-26 DIAGNOSIS — E1169 Type 2 diabetes mellitus with other specified complication: Secondary | ICD-10-CM

## 2018-12-26 DIAGNOSIS — E1142 Type 2 diabetes mellitus with diabetic polyneuropathy: Secondary | ICD-10-CM

## 2018-12-26 DIAGNOSIS — I152 Hypertension secondary to endocrine disorders: Secondary | ICD-10-CM

## 2018-12-26 DIAGNOSIS — Z91148 Patient's other noncompliance with medication regimen for other reason: Secondary | ICD-10-CM

## 2018-12-26 LAB — POCT UA - MICROALBUMIN
Albumin/Creatinine Ratio, Urine, POC: <30
Creatinine, POC: 50 mg/dL
Microalbumin Ur, POC: 10 mg/L

## 2018-12-26 MED ORDER — LOSARTAN POTASSIUM 50 MG PO TABS: 50.0000 mg | ORAL_TABLET | Freq: Every day | ORAL | 3 refills | Status: DC

## 2018-12-26 NOTE — Telephone Encounter (Signed)
Please review for refill. Thanks!  

## 2018-12-26 NOTE — Patient Instructions (Signed)
Diabetes Mellitus and Standards of Medical Care  Managing diabetes (diabetes mellitus) can be complicated. Your diabetes treatment may be managed by a team of health care providers, including:  A diet and nutrition specialist (registered dietitian).  A nurse.  A certified diabetes educator (CDE).  A diabetes specialist (endocrinologist).  An eye doctor.  A primary care provider.  A dentist.  Your health care providers follow a schedule in order to help you get the best quality of care. The following schedule is a general guideline for your diabetes management plan. Your health care providers may also give you more specific instructions.  HbA1c (hemoglobin A1c) test This test provides information about blood sugar (glucose) control over the previous 2-3 months. It is used to check whether your diabetes management plan needs to be adjusted.  If you are meeting your treatment goals, this test is done at least 2 times a year.  If you are not meeting treatment goals or if your treatment goals have changed, this test is done 4 times a year.  Blood pressure test  This test is done at every routine medical visit. For most people, the goal is less than 130/80. Ask your health care provider what your goal blood pressure should be.  Dental and eye exams  Visit your dentist two times a year.  If you have type 1 diabetes, get an eye exam 3-5 years after you are diagnosed, and then once a year after your first exam. ? If you were diagnosed with type 1 diabetes as a child, get an eye exam when you are age 10 or older and have had diabetes for 3-5 years. After the first exam, you should get an eye exam once a year.  If you have type 2 diabetes, have an eye exam as soon as you are diagnosed, and then once a year after your first exam.  Foot care exam  Visual foot exams are done at every routine medical visit. The exams check for cuts, bruises, redness, blisters, sores, or other problems with  the feet.  A complete foot exam is done by your health care provider once a year. This exam includes an inspection of the structure and skin of your feet, and a check of the pulses and sensation in your feet. ? Type 1 diabetes: Get your first exam 3-5 years after diagnosis. ? Type 2 diabetes: Get your first exam as soon as you are diagnosed.  Check your feet every day for cuts, bruises, redness, blisters, or sores. If you have any of these or other problems that are not healing, contact your health care provider.  Kidney function test (urine microalbumin)  This test is done once a year. ? Type 1 diabetes: Get your first test 5 years after diagnosis. ? Type 2 diabetes: Get your first test as soon as you are diagnosed._  If you have chronic kidney disease (CKD), get a serum creatinine and estimated glomerular filtration rate (eGFR) test once a year.  Lipid profile (cholesterol, HDL, LDL, triglycerides)  This test should be done when you are diagnosed with diabetes, and every 5 years after the first test. If you are on medicines to lower your cholesterol, you may need to get this test done every year. ? The goal for LDL is less than 100 mg/dL (5.5 mmol/L). If you are at high risk, the goal is less than 70 mg/dL (3.9 mmol/L). ? The goal for HDL is 40 mg/dL (2.2 mmol/L) for men and 50 mg/dL(2.8 mmol/L)   for women. An HDL cholesterol of 60 mg/dL (3.3 mmol/L) or higher gives some protection against heart disease. ? The goal for triglycerides is less than 150 mg/dL (8.3 mmol/L).  Immunizations  The yearly flu (influenza) vaccine is recommended for everyone 6 months or older who has diabetes.  The pneumonia (pneumococcal) vaccine is recommended for everyone 2 years or older who has diabetes. If you are 65 or older, you may get the pneumonia vaccine as a series of two separate shots.  The hepatitis B vaccine is recommended for adults shortly after they have been diagnosed with diabetes.  The Tdap  (tetanus, diphtheria, and pertussis) vaccine should be given: ? According to normal childhood vaccination schedules, for children. ? Every 10 years, for adults who have diabetes.  The shingles vaccine is recommended for people who have had chicken pox and are 50 years or older.  Mental and emotional health  Screening for symptoms of eating disorders, anxiety, and depression is recommended at the time of diagnosis and afterward as needed. If your screening shows that you have symptoms (you have a positive screening result), you may need further evaluation and be referred to a mental health care provider.  Diabetes self-management education  Education about how to manage your diabetes is recommended at diagnosis and ongoing as needed.  Treatment plan  Your treatment plan will be reviewed at every medical visit.  Summary  Managing diabetes (diabetes mellitus) can be complicated. Your diabetes treatment may be managed by a team of health care providers.  Your health care providers follow a schedule in order to help you get the best quality of care.  Standards of care including having regular physical exams, blood tests, blood pressure monitoring, immunizations, screening tests, and education about how to manage your diabetes.  Your health care providers may also give you more specific instructions based on your individual health.      Type 2 Diabetes Mellitus, Self Care, Adult Caring for yourself after you have been diagnosed with type 2 diabetes (type 2 diabetes mellitus) means keeping your blood sugar (glucose) under control with a balance of:  Nutrition.  Exercise.  Lifestyle changes.  Medicines or insulin, if necessary.  Support from your team of health care providers and others.  The following information explains what you need to know to manage your diabetes at home. What do I need to do to manage my blood glucose?  Check your blood glucose every day, as often as  told by your health care provider.  Contact your health care provider if your blood glucose is above your target for 2 tests in a row.  Have your A1c (hemoglobin A1c) level checked at least two times a year, or as often as told by your health care provider. Your health care provider will set individualized treatment goals for you. Generally, the goal of treatment is to maintain the following blood glucose levels:  Before meals (preprandial): 80-130 mg/dL (4.4-7.2 mmol/L).  After meals (postprandial): below 180 mg/dL (10 mmol/L).  A1c level: less than 7%.  What do I need to know about hyperglycemia and hypoglycemia? What is hyperglycemia? Hyperglycemia, also called high blood glucose, occurs when blood glucose is too high.Make sure you know the early signs of hyperglycemia, such as:  Increased thirst.  Hunger.  Feeling very tired.  Needing to urinate more often than usual.  Blurry vision.  What is hypoglycemia? Hypoglycemia, also called low blood glucose, occurswith a blood glucose level at or below 70 mg/dL (3.9 mmol/L).   The risk for hypoglycemia increases during or after exercise, during sleep, during illness, and when skipping meals or not eating for a long time (fasting). It is important to know the symptoms of hypoglycemia and treat it right away. Always have a 15-gram rapid-acting carbohydrate snack with you to treat low blood glucose. Family members and close friends should also know the symptoms and should understand how to treat hypoglycemia, in case you are not able to treat yourself. What are the symptoms of hypoglycemia? Hypoglycemia symptoms can include:  Hunger.  Anxiety.  Sweating and feeling clammy.  Confusion.  Dizziness or feeling light-headed.  Sleepiness.  Nausea.  Increased heart rate.  Headache.  Blurry vision.  Seizure.  Nightmares.  Tingling or numbness around the mouth, lips, or tongue.  A change in speech.  Decreased ability to  concentrate.  A change in coordination.  Restless sleep.  Tremors or shakes.  Fainting.  Irritability.  How do I treat hypoglycemia?  If you are alert and able to swallow safely, follow the 15:15 rule:  Take 15 grams of a rapid-acting carbohydrate. Rapid-acting options include: ? 1 tube of glucose gel. ? 3 glucose pills. ? 6-8 pieces of hard candy. ? 4 oz (120 mL) of fruit juice. ? 4 oz (120 mL) of regular (not diet) soda.  Check your blood glucose 15 minutes after you take the carbohydrate.  If the repeat blood glucose level is still at or below 70 mg/dL (3.9 mmol/L), take 15 grams of a carbohydrate again.  If your blood glucose level does not increase above 70 mg/dL (3.9 mmol/L) after 3 tries, seek emergency medical care.  After your blood glucose level returns to normal, eat a meal or a snack within 1 hour.  How do I treat severe hypoglycemia? Severe hypoglycemia is when your blood glucose level is at or below 54 mg/dL (3 mmol/L). Severe hypoglycemia is an emergency. Do not wait to see if the symptoms will go away. Get medical help right away. Call your local emergency services (911 in the [place] Do not drive yourself to the hospital. If you have severe hypoglycemia and you cannot eat or drink, you may need an injection of glucagon. A family member or close friend should learn how to check your blood glucose and how to give you a glucagon injection. Ask your health care provider if you need to have an emergency glucagon injection kit available. Severe hypoglycemia may need to be treated in a hospital. The treatment may include getting glucose through an IV tube. You may also need treatment for the cause of your hypoglycemia. Can having diabetes put me at risk for other conditions? Having diabetes can put you at risk for other long-term (chronic) conditions, such as heart disease and kidney disease. Your health care provider may prescribe medicines to help prevent complications  from diabetes. These medicines may include:  Aspirin.  Medicine to lower cholesterol.  Medicine to control blood pressure.  What else can I do to manage my diabetes? Take your diabetes medicines as told  If your health care provider prescribed insulin or diabetes medicines, take them every day.  Do not run out of insulin or other diabetes medicines that you take. Plan ahead so you always have these available.  If you use insulin, adjust your dosage based on how physically active you are and what foods you eat. Your health care provider will tell you how to adjust your dosage. Make healthy food choices  The things that you eat   and drink affect your blood glucose and your insulin dosage. Making good choices helps to control your diabetes and prevent other health problems. A healthy meal plan includes eating lean proteins, complex carbohydrates, fresh fruits and vegetables, low-fat dairy products, and healthy fats. Make an appointment to see a diet and nutrition specialist (registered dietitian) to help you create an eating plan that is right for you. Make sure that you:  Follow instructions from your health care provider about eating or drinking restrictions.  Drink enough fluid to keep your urine clear or pale yellow.  Eat healthy snacks between nutritious meals.  Track the carbohydrates that you eat. Do this by reading food labels and learning the standard serving sizes of foods.  Follow your sick day plan whenever you cannot eat or drink as usual. Make this plan in advance with your health care provider.  Stay active  Exercise regularly, as told by your health care provider. This may include:  Stretching and doing strength exercises, such as yoga or weightlifting, at least 2 times a week.  Doing at least 150 minutes of moderate-intensity or vigorous-intensity exercise each week. This could be brisk walking, biking, or water aerobics. ? Spread out your activity over at least 3  days of the week. ? Do not go more than 2 days in a row without doing some kind of physical activity.  When you start a new exercise or activity, work with your health care provider to adjust your insulin, medicines, or food intake as needed. Make healthy lifestyle choices  Do not use any tobacco products, such as cigarettes, chewing tobacco, and e-cigarettes. If you need help quitting, ask your health care provider.  If your health care provider says that alcohol is safe for you, limit alcohol intake to no more than 1 drink per day for nonpregnant women and 2 drinks per day for men. One drink equals 12 oz of beer, 5 oz of wine, or 1 oz of hard liquor.  Learn to manage stress. If you need help with this, ask your health care provider. Care for your body   Keep your immunizations up to date. In addition to getting vaccinations as told by your health care provider, it is recommended that you get vaccinated against the following illnesses: ? The flu (influenza). Get a flu shot every year. ? Pneumonia. ? Hepatitis B.  Schedule an eye exam soon after your diagnosis, and then one time every year after that.  Check your skin and feet every day for cuts, bruises, redness, blisters, or sores. Schedule a foot exam with your health care provider once every year.  Brush your teeth and gums two times a day, and floss at least one time a day. Visit your dentist at least once every 6 months.  Maintain a healthy weight. General instructions  Take over-the-counter and prescription medicines only as told by your health care provider.  Share your diabetes management plan with people in your workplace, school, and household.  Check your urine for ketones when you are ill and as told by your health care provider.  Ask your health care provider: ? Do I need to meet with a diabetes educator? ? Where can I find a support group for people with diabetes?  Carry a medical alert card or wear medical alert  jewelry.  Keep all follow-up visits as told by your health care provider. This is important. Where to find more information: For more information about diabetes, visit:  American Diabetes Association (ADA):   www.diabetes.org  American Association of Diabetes Educators (AADE): www.diabeteseducator.org/patient-resources  This information is not intended to replace advice given to you by your health care provider. Make sure you discuss any questions you have with your health care provider. Document Released: 06/29/2015 Document Revised: 08/13/2015 Document Reviewed: 04/10/2015 Elsevier Interactive Patient Education  2017 [employer]      Blood Glucose Monitoring, Adult Monitoring your blood sugar (glucose) helps you manage your diabetes. It also helps you and your health care provider determine how well your diabetes management plan is working. Blood glucose monitoring involves checking your blood glucose as often as directed, and keeping a record (log) of your results over time. Why should I monitor my blood glucose? Checking your blood glucose regularly can:  Help you understand how food, exercise, illnesses, and medicines affect your blood glucose.  Let you know what your blood glucose is at any time. You can quickly tell if you are having low blood glucose (hypoglycemia) or high blood glucose (hyperglycemia).  Help you and your health care provider adjust your medicines as needed.  When should I check my blood glucose? Follow instructions from your health care provider about how often to check your blood glucose.   This may depend on:  The type of diabetes you have.  How well-controlled your diabetes is.  Medicines you are taking.  If you have type 1 diabetes:  Check your blood glucose at least 2 times a day.  Also check your blood glucose: ? Before every insulin injection. ? Before and after exercise. ? Between meals. ? 2 hours after a meal. ? Occasionally between  2:00 a.m. and 3:00 a.m., as directed. ? Before potentially dangerous tasks, like driving or using heavy machinery. ? At bedtime.  You may need to check your blood glucose more often, up to 6-10 times a day: ? If you use an insulin pump. ? If you need multiple daily injections (MDI). ? If your diabetes is not well-controlled. ? If you are ill. ? If you have a history of severe hypoglycemia. ? If you have a history of not knowing when your blood glucose is getting low (hypoglycemia unawareness).  If you have type 2 diabetes:  If you take insulin or other diabetes medicines, check your blood glucose at least 2 times a day.  If you are on intensive insulin therapy, check your blood glucose at least 4 times a day. Occasionally, you may also need to check between 2:00 a.m. and 3:00 a.m., as directed.  Also check your blood glucose: ? Before and after exercise. ? Before potentially dangerous tasks, like driving or using heavy machinery.  You may need to check your blood glucose more often if: ? Your medicine is being adjusted. ? Your diabetes is not well-controlled. ? You are ill.  What is a blood glucose log?  A blood glucose log is a record of your blood glucose readings. It helps you and your health care provider: ? Look for patterns in your blood glucose over time. ? Adjust your diabetes management plan as needed.  Every time you check your blood glucose, write down your result and notes about things that may be affecting your blood glucose, such as your diet and exercise for the day.  Most glucose meters store a record of glucose readings in the meter. Some meters allow you to download your records to a computer. How do I check my blood glucose? Follow these steps to get accurate readings of your blood glucose: Supplies   needed   Blood glucose meter.  Test strips for your meter. Each meter has its own strips. You must use the strips that come with your meter.  A needle to  prick your finger (lancet). Do not use lancets more than once.  A device that holds the lancet (lancing device).  A journal or log book to write down your results.  Procedure  Wash your hands with soap and water.  Prick the side of your finger (not the tip) with the lancet. Use a different finger each time.  Gently rub the finger until a small drop of blood appears.  Follow instructions that come with your meter for inserting the test strip, applying blood to the strip, and using your blood glucose meter.  Write down your result and any notes.  Alternative testing sites  Some meters allow you to use areas of your body other than your finger (alternative sites) to test your blood.  If you think you may have hypoglycemia, or if you have hypoglycemia unawareness, do not use alternative sites. Use your finger instead.  Alternative sites may not be as accurate as the fingers, because blood flow is slower in these areas. This means that the result you get may be delayed, and it may be different from the result that you would get from your finger.  The most common alternative sites are: ? Forearm. ? Thigh. ? Palm of the hand.  Additional tips  Always keep your supplies with you.  If you have questions or need help, all blood glucose meters have a 24-hour "hotline" number that you can call. You may also contact your health care provider.  After you use a few boxes of test strips, adjust (calibrate) your blood glucose meter by following instructions that came with your meter.    The American Diabetes Association suggests the following targets for most nonpregnant adults with diabetes.  More or less stringent glycemic goals may be appropriate for each individual.  A1C: Less than 7% A1C may also be reported as eAG: Less than 154 mg/dl Before a meal (preprandial plasma glucose): 80-130 mg/dl 1-2 hours after beginning of the meal (Postprandial plasma glucose)*: Less than 180  mg/dl  *Postprandial glucose may be targeted if A1C goals are not met despite reaching preprandial glucose goals.   GOALS in short:  The goals are for the Hgb A1C to be less than 7.0 & blood pressure to be less than 130/80.    It is recommended that all diabetics are educated on and follow a healthy diabetic diet, exercise for 30 minutes 3-4 times per week (walking, biking, swimming, or machine), monitor blood glucose readings and bring that record with you to be reviewed at your next office visit.     You should be checking fasting blood sugars- especially after you eat poorly or eat really healthy, and also check 2 hour postprandial blood sugars after largest meal of the day.    Write these down and bring in your log at each office visit.    You will need to be seen every 3 months by the provider managing your Diabetes unless told otherwise by that provider.   You will need yearly eye exams from an eye specialist and foot exams to check the nerves of your feet.  Also, your urine should be checked yearly as well to make sure excess protein is not present.   If you are checking your blood pressure at home, please record it and bring it to   your next office visit.    Follow the Dietary Approaches to Stop Hypertension (DASH) diet (3 servings of fruit and vegetables daily, whole grains, low sodium, low-fat proteins).  See below.    Lastly, when it comes to your cholesterol, the goal is to have the HDL (good cholesterol) >40, and the LDL (bad cholesterol) <100.   It is recommended that you follow a heart healthy, low saturated and trans-fat diet and exercise for 30 minutes at least 5 times a week.     (( Check out the DASH diet = 1.5 Gram Low Sodium Diet   A 1.5 gram sodium diet restricts the amount of sodium in the diet to no more than 1.5 g or 1500 mg daily.  The American Heart Association recommends Americans over the age of 20 to consume no more than 1500 mg of sodium each day to reduce the  risk of developing high blood pressure.  Research also shows that limiting sodium may reduce heart attack and stroke risk.  Many foods contain sodium for flavor and sometimes as a preservative.  When the amount of sodium in a diet needs to be low, it is important to know what to look for when choosing foods and drinks.  The following includes some information and guidelines to help make it easier for you to adapt to a low sodium diet.    QUICK TIPS  Do not add salt to food.  Avoid convenience items and fast food.  Choose unsalted snack foods.  Buy lower sodium products, often labeled as "lower sodium" or "no salt added."  Check food labels to learn how much sodium is in 1 serving.  When eating at a restaurant, ask that your food be prepared with less salt or none, if possible.    READING FOOD LABELS FOR SODIUM INFORMATION  The nutrition facts label is a good place to find how much sodium is in foods. Look for products with no more than 400 mg of sodium per serving.  Remember that 1.5 g = 1500 mg.  The food label may also list foods as:  Sodium-free: Less than 5 mg in a serving.  Very low sodium: 35 mg or less in a serving.  Low-sodium: 140 mg or less in a serving.  Light in sodium: 50% less sodium in a serving. For example, if a food that usually has 300 mg of sodium is changed to become light in sodium, it will have 150 mg of sodium.  Reduced sodium: 25% less sodium in a serving. For example, if a food that usually has 400 mg of sodium is changed to reduced sodium, it will have 300 mg of sodium.    CHOOSING FOODS  Grains  Avoid: Salted crackers and snack items. Some cereals, including instant hot cereals. Bread stuffing and biscuit mixes. Seasoned rice or pasta mixes.  Choose: Unsalted snack items. Low-sodium cereals, oats, puffed wheat and rice, shredded wheat. English muffins and bread. Pasta.  Meats  Avoid: Salted, canned, smoked, spiced, pickled meats, including fish and poultry.  Bacon, ham, sausage, cold cuts, hot dogs, anchovies.  Choose: Low-sodium canned tuna and salmon. Fresh or frozen meat, poultry, and fish.  Dairy  Avoid: Processed cheese and spreads. Cottage cheese. Buttermilk and condensed milk. Regular cheese.  Choose: Milk. Low-sodium cottage cheese. Yogurt. Sour cream. Low-sodium cheese.  Fruits and Vegetables  Avoid: Regular canned vegetables. Regular canned tomato sauce and paste. Frozen vegetables in sauces. Olives. Pickles. Relishes. Sauerkraut.  Choose: Low-sodium canned vegetables. Low-sodium   tomato sauce and paste. Frozen or fresh vegetables. Fresh and frozen fruit.  Condiments  Avoid: Canned and packaged gravies. Worcestershire sauce. Tartar sauce. Barbecue sauce. Soy sauce. Steak sauce. Ketchup. Onion, garlic, and table salt. Meat flavorings and tenderizers.  Choose: Fresh and dried herbs and spices. Low-sodium varieties of mustard and ketchup. Lemon juice. Tabasco sauce. Horseradish.    SAMPLE 1.5 GRAM SODIUM MEAL PLAN:   Breakfast / Sodium (mg)  1 cup low-fat milk / 143 mg  1 whole-wheat English muffin / 240 mg  1 tbs heart-healthy margarine / 153 mg  1 hard-boiled egg / 139 mg  1 small orange / 0 mg  Lunch / Sodium (mg)  1 cup raw carrots / 76 mg  2 tbs no salt added peanut butter / 5 mg  2 slices whole-wheat bread / 270 mg  1 tbs jelly / 6 mg   cup red grapes / 2 mg  Dinner / Sodium (mg)  1 cup whole-wheat pasta / 2 mg  1 cup low-sodium tomato sauce / 73 mg  3 oz lean ground beef / 57 mg  1 small side salad (1 cup raw spinach leaves,  cup cucumber,  cup yellow bell pepper) with 1 tsp olive oil and 1 tsp red wine vinegar / 25 mg  Snack / Sodium (mg)  1 container low-fat vanilla yogurt / 107 mg  3 graham cracker squares / 127 mg  Nutrient Analysis  Calories: 1745  Protein: 75 g  Carbohydrate: 237 g  Fat: 57 g  Sodium: 1425 mg  Document Released: 03/07/2005 Document Revised: 11/17/2010 Document Reviewed: 06/08/2009  ExitCare  Patient Information 2012 [employer].))    This information is not intended to replace advice given to you by your health care provider. Make sure you discuss any questions you have with your health care provider. Document Released: 03/10/2003 Document Revised: 09/25/2015 Document Reviewed: 08/17/2015 Elsevier Interactive Patient Education  2017 [employer].      Your goal blood pressure should be 135/85 or less on a regular basis, her medications should be started.  Normal blood pressure is 120/80 or less.    Hypertension Hypertension, commonly called high blood pressure, is when the force of blood pumping through the arteries is too strong. The arteries are the blood vessels that carry blood from the heart throughout the body. Hypertension forces the heart to work harder to pump blood and may cause arteries to become narrow or stiff. Having untreated or uncontrolled hypertension can cause heart attacks, strokes, kidney disease, and other problems. A blood pressure reading consists of a higher number over a lower number. Ideally, your blood pressure should be below 120/80. The first ("top") number is called the systolic pressure. It is a measure of the pressure in your arteries as your heart beats. The second ("bottom") number is called the diastolic pressure. It is a measure of the pressure in your arteries as the heart relaxes. What are the causes? The cause of this condition is not known. What increases the risk? Some risk factors for high blood pressure are under your control. Others are not. Factors you can change  Smoking.  Having type 2 diabetes mellitus, high cholesterol, or both.  Not getting enough exercise or physical activity.  Being overweight.  Having too much fat, sugar, calories, or salt (sodium) in your diet.  Drinking too much alcohol. Factors that are difficult or impossible to change  Having chronic kidney disease.  Having a family history of high  blood  pressure.  Age. Risk increases with age.  Race. You may be at higher risk if you are African-American.  Gender. Men are at higher risk than women before age 79. After age 36, women are at higher risk than men.  Having obstructive sleep apnea.  Stress. What are the signs or symptoms? Extremely high blood pressure (hypertensive crisis) may cause:  Headache.  Anxiety.  Shortness of breath.  Nosebleed.  Nausea and vomiting.  Severe chest pain.  Jerky movements you cannot control (seizures).  How is this diagnosed? This condition is diagnosed by measuring your blood pressure while you are seated, with your arm resting on a surface. The cuff of the blood pressure monitor will be placed directly against the skin of your upper arm at the level of your heart. It should be measured at least twice using the same arm. Certain conditions can cause a difference in blood pressure between your right and left arms. Certain factors can cause blood pressure readings to be lower or higher than normal (elevated) for a short period of time:  When your blood pressure is higher when you are in a health care provider's office than when you are at home, this is called white coat hypertension. Most people with this condition do not need medicines.  When your blood pressure is higher at home than when you are in a health care provider's office, this is called masked hypertension. Most people with this condition may need medicines to control blood pressure.  If you have a high blood pressure reading during one visit or you have normal blood pressure with other risk factors:  You may be asked to return on a different day to have your blood pressure checked again.  You may be asked to monitor your blood pressure at home for 1 week or longer.  If you are diagnosed with hypertension, you may have other blood or imaging tests to help your health care provider understand your overall risk for other  conditions. How is this treated? This condition is treated by making healthy lifestyle changes, such as eating healthy foods, exercising more, and reducing your alcohol intake. Your health care provider may prescribe medicine if lifestyle changes are not enough to get your blood pressure under control, and if:  Your systolic blood pressure is above 130.  Your diastolic blood pressure is above 80.  Your personal target blood pressure may vary depending on your medical conditions, your age, and other factors. Follow these instructions at home: Eating and drinking  Eat a diet that is high in fiber and potassium, and low in sodium, added sugar, and fat. An example eating plan is called the DASH (Dietary Approaches to Stop Hypertension) diet. To eat this way: ? Eat plenty of fresh fruits and vegetables. Try to fill half of your plate at each meal with fruits and vegetables. ? Eat whole grains, such as whole wheat pasta, brown rice, or whole grain bread. Fill about one quarter of your plate with whole grains. ? Eat or drink low-fat dairy products, such as skim milk or low-fat yogurt. ? Avoid fatty cuts of meat, processed or cured meats, and poultry with skin. Fill about one quarter of your plate with lean proteins, such as fish, chicken without skin, beans, eggs, and tofu. ? Avoid premade and processed foods. These tend to be higher in sodium, added sugar, and fat.  Reduce your daily sodium intake. Most people with hypertension should eat less than 1,500 mg of sodium a day.  Limit alcohol intake to no more than 1 drink a day for nonpregnant women and 2 drinks a day for men. One drink equals 12 oz of beer, 5 oz of wine, or 1 oz of hard liquor. Lifestyle  Work with your health care provider to maintain a healthy body weight or to lose weight. Ask what an ideal weight is for you.  Get at least 30 minutes of exercise that causes your heart to beat faster (aerobic exercise) most days of the week.  Activities may include walking, swimming, or biking.  Include exercise to strengthen your muscles (resistance exercise), such as pilates or lifting weights, as part of your weekly exercise routine. Try to do these types of exercises for 30 minutes at least 3 days a week.  Do not use any products that contain nicotine or tobacco, such as cigarettes and e-cigarettes. If you need help quitting, ask your health care provider.  Monitor your blood pressure at home as told by your health care provider.  Keep all follow-up visits as told by your health care provider. This is important. Medicines  Take over-the-counter and prescription medicines only as told by your health care provider. Follow directions carefully. Blood pressure medicines must be taken as prescribed.  Do not skip doses of blood pressure medicine. Doing this puts you at risk for problems and can make the medicine less effective.  Ask your health care provider about side effects or reactions to medicines that you should watch for. Contact a health care provider if:  You think you are having a reaction to a medicine you are taking.  You have headaches that keep coming back (recurring).  You feel dizzy.  You have swelling in your ankles.  You have trouble with your vision. Get help right away if:  You develop a severe headache or confusion.  You have unusual weakness or numbness.  You feel faint.  You have severe pain in your chest or abdomen.  You vomit repeatedly.  You have trouble breathing. Summary  Hypertension is when the force of blood pumping through your arteries is too strong. If this condition is not controlled, it may put you at risk for serious complications.  Your personal target blood pressure may vary depending on your medical conditions, your age, and other factors. For most people, a normal blood pressure is less than 120/80.  Hypertension is treated with lifestyle changes, medicines, or a  combination of both. Lifestyle changes include weight loss, eating a healthy, low-sodium diet, exercising more, and limiting alcohol. This information is not intended to replace advice given to you by your health care provider. Make sure you discuss any questions you have with your health care provider. Document Released: 03/07/2005 Document Revised: 02/03/2016 Document Reviewed: 02/03/2016 Elsevier Interactive Patient Education  2018 [employer].    How to Take Your Blood Pressure   Blood pressure is a measurement of how strongly your blood is pressing against the walls of your arteries. Arteries are blood vessels that carry blood from your heart throughout your body. Your health care provider takes your blood pressure at each office visit. You can also take your own blood pressure at home with a blood pressure machine. You may need to take your own blood pressure:  To confirm a diagnosis of high blood pressure (hypertension).  To monitor your blood pressure over time.  To make sure your blood pressure medicine is working.  Supplies needed: To take your blood pressure, you will need a blood pressure  machine. You can buy a blood pressure machine, or blood pressure monitor, at most drugstores or online. There are several types of home blood pressure monitors. When choosing one, consider the following:  Choose a monitor that has an arm cuff.  Choose a monitor that wraps snugly around your upper arm. You should be able to fit only one finger between your arm and the cuff.  Do not choose a monitor that measures your blood pressure from your wrist or finger.  Your health care provider can suggest a reliable monitor that will meet your needs. How to prepare To get the most accurate reading, avoid the following for 30 minutes before you check your blood pressure:  Drinking caffeine.  Drinking alcohol.  Eating.  Smoking.  Exercising.  Five minutes before you check your blood  pressure:  Empty your bladder.  Sit quietly without talking in a dining chair, rather than in a soft couch or armchair.  How to take your blood pressure To check your blood pressure, follow the instructions in the manual that came with your blood pressure monitor. If you have a digital blood pressure monitor, the instructions may be as follows: 1. Sit up straight. 2. Place your feet on the floor. Do not cross your ankles or legs. 3. Rest your left arm at the level of your heart on a table or desk or on the arm of a chair. 4. Pull up your shirt sleeve. 5. Wrap the blood pressure cuff around the upper part of your left arm, 1 inch (2.5 cm) above your elbow. It is best to wrap the cuff around bare skin. 6. Fit the cuff snugly around your arm. You should be able to place only one finger between the cuff and your arm. 7. Position the cord inside the groove of your elbow. 8. Press the power button. 9. Sit quietly while the cuff inflates and deflates. 10. Read the digital reading on the monitor screen and write it down (record it). 11. Wait 2-3 minutes, then repeat the steps, starting at step 1.  What does my blood pressure reading mean? A blood pressure reading consists of a higher number over a lower number. Ideally, your blood pressure should be below 120/80. The first ("top") number is called the systolic pressure. It is a measure of the pressure in your arteries as your heart beats. The second ("bottom") number is called the diastolic pressure. It is a measure of the pressure in your arteries as the heart relaxes. Blood pressure is classified into four stages. The following are the stages for adults who do not have a short-term serious illness or a chronic condition. Systolic pressure and diastolic pressure are measured in a unit called mm Hg. Normal  Systolic pressure: below 409.  Diastolic pressure: below 80. Elevated  Systolic pressure: 811-914.  Diastolic pressure: below  80. Hypertension stage 1  Systolic pressure: 782-956.  Diastolic pressure: 21-30. Hypertension stage 2  Systolic pressure: 865 or above.  Diastolic pressure: 90 or above. You can have prehypertension or hypertension even if only the systolic or only the diastolic number in your reading is higher than normal. Follow these instructions at home:  Check your blood pressure as often as recommended by your health care provider.  Take your monitor to the next appointment with your health care provider to make sure: ? That you are using it correctly. ? That it provides accurate readings.  Be sure you understand what your goal blood pressure numbers are.  Tell  your health care provider if you are having any side effects from blood pressure medicine. Contact a health care provider if:  Your blood pressure is consistently high. Get help right away if:  Your systolic blood pressure is higher than 180.  Your diastolic blood pressure is higher than 110. This information is not intended to replace advice given to you by your health care provider. Make sure you discuss any questions you have with your health care provider. Document Released: 08/14/2015 Document Revised: 10/27/2015 Document Reviewed: 08/14/2015 Elsevier Interactive Patient Education  [employer].

## 2018-12-26 NOTE — Progress Notes (Signed)
Impression and Recommendations:    1. Controlled diabetes mellitus type 2 with complications, unspecified whether long term insulin use ([name])   2. Diabetes mellitus due to underlying condition with stage 3 chronic kidney disease, without long-term current use of insulin, unspecified whether stage 3a or 3b CKD ([name])   3. Hypertension associated with diabetes ([name])   4. Mixed diabetic hyperlipidemia associated with type 2 diabetes mellitus ([name])   5. Obesity, Class III, morbid obesity  ([name])   6. Diabetic retinopathy associated with diabetes mellitus due to underlying condition, macular edema presence unspecified, unspecified laterality, unspecified retinopathy severity ([name])   7. Diabetic peripheral neuropathy associated with type 2 diabetes mellitus ([place])   8. H/O medication noncompliance   9. Hormone imbalance   10. PAD (peripheral artery disease) ([name])-  See's Cards- Dr Elaina Pattee [place]/ that group for this condition     Controlled Diabetes Mellitus w/ Stage 3 CKD - Patient no longer followed by Dr. Buddy Duty of Endocrinology -   only [name]. - A1c last checked seven months ago was 6.0, at goal.  - Educated patient regarding management today and all questions answered.  - Pt will continue current treatment regimen excpt decrease insulin dose.  - Continue Ozempic at current dose.  See med list. - Advised patient to reduce her use of Tresiba to 25 units instead of 50. - Discussed importance of avoiding low blood sugar.  - Counseled patient on pathophysiology of disease and discussed various treatment options, which always includes dietary and lifestyle modification as first line.    - Importance of low carb, heart-healthy diet discussed with patient in addition to regular aerobic exercise of 49mn 5d/week or more.   - Check FBS and 2 hours after the biggest meal of your day.  Keep log and bring in next OV for my review.    - Also told patient if you ever feel poorly, please check your blood  pressure and blood sugar, as one or the other could be the cause of your symptoms.  - Pt reminded about need for yearly eye and foot exams.  Told patient to make appt.for diabetic eye exam, CMAs here will do foot exams  - Handouts provided at patient's desire and or told to go online at the American Diabetes Association website for further information  - We will continue to monitor  Need for Lab Work & History of Noncompliance - Labs drawn today for re-check. - Urine drawn today.  - Discussed critical importance of compliance with treatment plan. - Lengthy discussion held and all questions answered.  - Patient knows to continue to follow up with specialists as established.  Diabetic Retinopathy - Stable at this time. - pt knows q yrly exams needed - Will continue to monitor.  Diabetic Peripheral Neuropathy - Stable at this time. Denies any worsening sx at this time. - Will continue to monitor.  Hypertension associated with DM - BP elevated on intake today. - Per pt, BP controlled at home.  - Per patient, not taking her losartan nightly; only taking 2-3 days per week. - STRONGLY advised patient to take her losartan as prescribed.  See med list. - Patient tolerating meds well without complication.  Denies S-E.  - Confirms she has been taking her metoprolol.  - Counseled patient on pathophysiology of disease and discussed various treatment options, which always includes dietary and lifestyle modification as first line.   - Lifestyle changes such as dash and heart healthy diets and engaging  in a regular exercise program discussed extensively with patient.   - Ambulatory blood pressure monitoring encouraged at least 3 times weekly.  Keep log and bring in every office visit.  Reminded patient that if they ever feel poorly in any way, to check their blood pressure and pulse.  - Handouts provided at patient's desire and/or told to go online at the [place] website  for further information  - We will continue to monitor  Mixed Diabetic Hyperlipidemia associated with DM - Managed on statin. - Continue management as established.  - No change in treatment plan today.  See med list below. - Patient tolerating meds well without complication.  Denies S-E  - Will continue to monitor.  Female [place] patient to seek follow-up with gynecology regarding hormonal concerns. - Ambulatory referral to gynecology placed today.  See orders. - Will continue to monitor.  BMI Counseling - Body mass index is 36.9 kg/m; Obesity Explained to patient what BMI refers to, and what it means medically.  Told patient to think about it as a "medical risk stratification measurement" and how increasing BMI is associated with increasing risk/ or worsening state of various diseases such as hypertension, hyperlipidemia, diabetes, premature OA, depression etc.  - Discussed adjusting diet and increasing physical activity to help lose weight.  American Heart Association guidelines for healthy diet, basically Mediterranean diet, and exercise guidelines of 30 minutes 5 days per week or more discussed in detail.  Health counseling performed.  All questions answered.  Lifestyle & Preventative Health Maintenance - Advised patient to continue working toward exercising to improve overall mental, physical, and emotional health.    - Reviewed the "spokes of the wheel" of mood and health management.  Stressed the importance of ongoing prudent habits, including regular exercise, appropriate sleep hygiene, healthful dietary habits, and prayer/meditation to relax.  - Encouraged patient to engage in daily physical activity, especially a formal exercise routine.  Recommended that the patient eventually strive for at least 150 minutes of moderate cardiovascular activity per week according to guidelines established by the [facility].   - Healthy dietary habits encouraged,  including low-carb, and high amounts of lean protein in diet.   - Patient should also consume adequate amounts of water.   Education and routine counseling performed. Handouts provided.   Orders Placed This Encounter  Procedures  . CBC with Differential/Platelet  . Comprehensive metabolic panel  . Hemoglobin A1c  . Lipid panel  . T3  . T4, free  . TSH  . VITAMIN D 25 Hydroxy (Vit-D Deficiency, Fractures)  . Ambulatory referral to Gynecology  . POCT UA - Microalbumin    Medications Discontinued During This Encounter  Medication Reason  . losartan (COZAAR) 50 MG tablet Change in therapy  . meloxicam (MOBIC) 15 MG tablet Completed Course  . losartan (COZAAR) 25 MG tablet Reorder      Meds ordered this encounter  Medications  . losartan (COZAAR) 50 MG tablet    Sig: Take 1 tablet (50 mg total) by mouth daily.    Dispense:  90 tablet    Refill:  3    The patient was counseled, risk factors were discussed, anticipatory guidance given.  Gross side effects, risk and benefits, and alternatives of medications discussed with patient.  Patient is aware that all medications have potential side effects and we are unable to predict every side effect or drug-drug interaction that may occur.  Expresses verbal understanding and consents to current  therapy plan and treatment regimen.   Return for 3 months CPE after January 22.   Please see AVS handed out to patient at the end of our visit for further patient instructions/ counseling done pertaining to today's office visit.    Note:  This document was prepared using Dragon voice recognition software and may include unintentional dictation errors.   This document serves as a record of services personally performed by Mellody Dance, DO. It was created on her behalf by Toni Amend, a trained medical scribe. The creation of this record is based on the scribe's personal observations and the provider's statements to them.   I have  reviewed the above medical documentation for accuracy and completeness and I concur.  Mellody Dance, DO 12/27/2018 12:50 PM        Subjective:    Chief Complaint  Patient presents with  . Follow-up     Julie Prince is a 58 y.o. female who presents to [place] [facility] today for Diabetes Management.    - Hormonal Concerns and worries about belly fat Says she believes she has an "imabalance of her hormones" right now and notes she went through menopause "years ago."  Says "I can never get rid of this belly fat at all and I was just wondering why I can lose weight but my stomach fat ain't going nowhere."  Says "my concern is just being healthy."  Notes "I don't really eat a lot."  - PAD With Dr. Oval Linsey and her PAD, patient states nothing's new.  Notes she recently had a procedure done.  She also just went to see Dr. Fletcher Anon and Dr. Amalia Hailey; has no concerns or complaints.  - Husband with Prostate Cancer Notes "my husband has cancer so I've been dealing with him."  States he recently had a seed planted.  They've been trying to deal with insurance and her husband's related medical issues.  Says recently her husband was bleeding from his rectal / testicular region, but the patient was able to take care of the situation.  DM HPI: -  She has not been working on diet and exercise for diabetes.  States she's too busy to exercise, but doesn't "eat much."  Pt is currently maintained on the following medications for diabetes:   see med list today Medication compliance - continues treatment plan as established. Denies concerns or side-effects with her medications.  Continues taking 50 units of [place].  Home glucose readings range: notes running around 120 "last I checked."   Denies polyuria/polydipsia. Denies hypo/ hyperglycemia symptoms - She denies new onset of: chest pain, exercise intolerance, shortness of breath, dizziness, visual changes, headache,  lower extremity swelling or claudication.   Last diabetic eye exam was  Lab Results  Component Value Date   HMDIABEYEEXA Retinopathy (A) 07/07/2017   Foot exam- UTD  Last A1C in the office was:  Lab Results  Component Value Date   HGBA1C 7.0 (H) 12/26/2018   HGBA1C 6.0 (A) 05/28/2018   HGBA1C 5.8 11/28/2017    Lab Results  Component Value Date   MICROALBUR 10 12/26/2018   [place] 145 (H) 12/26/2018   CREATININE 1.00 12/26/2018   HPI:  Hypertension:  -  Her blood pressure at home has been controlled.  Notes her BP was 12080 at the doctor's yesterday.  Notes "lately it's been running good," 125-130/80's at home.  Says "it's been normal!"  States she's not taking her losartan nightly.  Patient thinks she's been taking  her medication 2-3 times per week.  Says sometimes she just looks at her bottles of pills and is like "I ain't taking you tonight, I'm going to bed."  - Her denies acute concerns or problems related to treatment plan  - She denies new onset of: chest pain, exercise intolerance, shortness of breath, dizziness, visual changes, headache, lower extremity swelling or claudication.   Last 3 blood pressure readings in our office are as follows: BP Readings from Last 3 Encounters:  12/26/18 (!) 170/89  12/19/18 120/82  12/18/18 140/79   Filed Weights   12/26/18 0844  Weight: 215 lb (97.5 kg)    HPI:  Hyperlipidemia:  58 y.o. female here for cholesterol follow-up.   - Patient reports good compliance with treatment plan of:  medication and/ or lifestyle management.    - Patient denies any acute concerns or problems with management plan   - She denies new onset of: myalgias, arthralgias, increased fatigue more than normal, chest pains, exercise intolerance, shortness of breath, dizziness, visual changes, headache, lower extremity swelling or claudication.   Most recent cholesterol panel was:  Lab Results  Component Value Date   CHOL 208 (H) 12/26/2018   HDL  46 12/26/2018   LDLCALC 145 (H) 12/26/2018   TRIG 95 12/26/2018   CHOLHDL 4.5 (H) 12/26/2018   Hepatic Function Latest Ref Rng & Units 12/26/2018 07/21/2017 04/26/2016  Total Protein 6.0 - 8.5 g/dL 6.8 6.6 7.0  Albumin 3.8 - 4.9 g/dL 3.7(L) 3.6 3.8  AST 0 - 40 IU/L 11 5 8(L)  ALT 0 - 32 IU/L '9 9 8  '$ Alk Phosphatase 39 - 117 IU/L 106 94 81  Total Bilirubin 0.0 - 1.2 mg/dL 0.3 0.4 0.4     BMI Readings from Last 3 Encounters:  12/26/18 36.90 kg/m  12/19/18 36.90 kg/m  12/18/18 37.42 kg/m     No problems updated.    Patient Care Team    Relationship Specialty Notifications Start End  Mellody Dance, DO PCP - General Family Medicine  07/12/17   Skeet Latch, MD PCP - Cardiology Cardiology Admissions 05/29/18   Pyrtle, Lajuan Lines, MD Consulting Physician Gastroenterology  07/13/17   Delrae Rend, MD Consulting Physician Endocrinology  07/13/17   Emily Filbert, MD Consulting Physician Obstetrics and Gynecology  07/13/17   Christella Hartigan, RD Dietitian Dietician  09/05/17      Patient Active Problem List   Diagnosis Date Noted  . Diabetes mellitus due to underlying condition with stage 3 chronic kidney disease, without long-term current use of insulin ([place]) 07/12/2017    Priority: High  . Mixed diabetic hyperlipidemia associated with type 2 diabetes mellitus ([place]) 07/12/2017    Priority: High  . Hypertension associated with diabetes ([place]) 07/12/2017    Priority: High  . Obesity, Class III, morbid obesity  ([place]) 07/12/2017    Priority: High  . Currently controlled diabetes mellitus type 2 with complications ([place]) 50/38/8828    Priority: High  . PAD (peripheral artery disease) ([name])-  See's Cards- Dr Elaina Pattee [place]/ that group for this condition 05/28/2018    Priority: Medium  . Diabetic retinopathy associated with diabetes mellitus due to underlying condition ([place]) 04/20/2018    Priority: Medium  . Tachycardia with hypertension 03/01/2016    Priority: Medium  . Diabetic  peripheral neuropathy associated with type 2 diabetes mellitus ([place]) 09/12/2013    Priority: Medium  . H/O medication noncompliance 07/12/2017    Priority: Low  . Barrett's esophagus 03/11/2016  Priority: Low  . 1st degree AV block 03/01/2016    Priority: Low  . Stress due to illness of family member 09/05/2018  . Foot pain, left 04/20/2018  . Trigger ring finger 04/20/2018  . 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 Dysphagia 02/10/2016  . Vulvar abscess   . Sepsis ([place]) 06/21/2015  . GERD (gastroesophageal reflux disease) 07/31/2014  . Adenomatous polyp of colon 04/03/2014  . Allergic rhinitis 01/23/2014  . CTS (carpal tunnel syndrome) 10/03/2013  . Uncontrolled type 2 diabetes mellitus with hyperglycemia ([place]) 10/03/2013  . Low back pain 10/03/2013  . Lumbar arthropathy 10/03/2013  . Lumbar degenerative disc disease 10/03/2013  . Neuropathy 10/03/2013  . Osteoarthritis 10/03/2013  . 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 Tenosynovitis of foot 10/03/2013  . Hyperlipidemia 09/12/2013  . Essential hypertension, benign 09/12/2013     Past Medical History:  Diagnosis Date  . Allergy   . Barrett esophagus   . Cataract    left cataract removal  . Essential hypertension, benign 09/12/2013  . GERD (gastroesophageal reflux disease)   . Hyperlipidemia 09/12/2013  . 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 Type 2 diabetes mellitus ([place]) 09/12/2013   Dr. Posey Pronto at [facility]      Past Surgical History:  Procedure Laterality Date  . ABDOMINAL AORTOGRAM W/LOWER EXTREMITY N/A 06/06/2018   Procedure: ABDOMINAL AORTOGRAM W/LOWER EXTREMITY;  Surgeon: Wellington Hampshire, MD;  Location: [facility];  Service: Cardiovascular;  Laterality: N/A;  . APPENDECTOMY    . CATARACT EXTRACTION Left   . CESAREAN SECTION  1990  . COLONOSCOPY    . right tube and ovary removed    . WISDOM TOOTH EXTRACTION       Family History  Problem Relation Age of Onset  . Lung  cancer Mother   . Stroke Father   . Heart disease Maternal Aunt   . Fibromyalgia Sister   . Alzheimer's disease Maternal Grandmother   . Colon cancer Neg Hx   . Esophageal cancer Neg Hx   . Rectal cancer Neg Hx   . Stomach cancer Neg Hx   . Pancreatic cancer Neg Hx      Social History   Substance and Sexual Activity  Drug Use No  ,  Social History   Substance and Sexual Activity  Alcohol Use No  . Alcohol/week: 0.0 standard drinks  ,  Social History   Tobacco Use  Smoking Status Former Smoker  . Packs/day: 0.50  Smokeless Tobacco Never Used  Tobacco Comment   quit 30 plus years ago.  ,    Current Outpatient Medications on File Prior to Visit  Medication Sig Dispense Refill  . aspirin 81 MG chewable tablet Chew by mouth daily.    Marland Kitchen atorvastatin (LIPITOR) 40 MG tablet Take 1 tablet (40 mg total) by mouth at bedtime. 90 tablet 1  . Continuous Blood Gluc Sensor (FREESTYLE LIBRE 14 DAY SENSOR) MISC TEST DAILY AS DIRECTED AND CHANGE EVERY 14 DAYS    . metoprolol tartrate (LOPRESSOR) 50 MG tablet Take 1.5 tablets (75 mg total) by mouth 2 (two) times daily. 270 tablet 1  . Semaglutide, 1 MG/DOSE, (OZEMPIC, 1 MG/DOSE,) 2 MG/1.5ML SOPN Inject 1 mg into the skin once a week. 18 pen 1  . TRESIBA FLEXTOUCH 100 UNIT/ML SOPN FlexTouch Pen Inject 25 mg into the skin at bedtime.     Current Facility-Administered Medications on File Prior to Visit  Medication Dose Route Frequency Provider Last Rate Last Dose  . 0.9 %  sodium chloride infusion  500 mL Intravenous Once Pyrtle, Lajuan Lines, MD         Allergies  Allergen Reactions  . Penicillins Hives and Shortness Of Breath  . Metformin And Related Diarrhea     Review of Systems:   General:  Denies fever, chills Optho/Auditory:   Denies visual changes, blurred vision Respiratory:   Denies SOB, cough, wheeze, DIB  Cardiovascular:   Denies chest pain, palpitations, painful respirations Gastrointestinal:   Denies nausea, vomiting,  diarrhea.  Endocrine:     Denies new hot or cold intolerance Musculoskeletal:  Denies joint swelling, gait issues, or new unexplained myalgias/ arthralgias Skin:  Denies rash, suspicious lesions  Neurological:    Denies dizziness, unexplained weakness, numbness  Psychiatric/Behavioral:   Denies mood changes    Objective:     Blood pressure (!) 170/89, pulse 82, height '5\' 4"'$  (1.626 m), weight 215 lb (97.5 kg), SpO2 99 %.  Body mass index is 36.9 kg/m.  General: Well Developed, well nourished, and in no acute distress.  HEENT: Normocephalic, atraumatic, pupils equal round reactive to light, neck supple, No carotid bruits, no JVD Skin: Warm and dry, cap RF less 2 sec Cardiac: Regular rate and rhythm, S1, S2 WNL's, no murmurs rubs or gallops Respiratory: ECTA B/L, Not using accessory muscles, speaking in full sentences. NeuroM-Sk: Ambulates w/o assistance, moves ext * 4 w/o difficulty, sensation grossly intact.  Ext: scant edema b/l lower ext Psych: No HI/SI, judgement and insight good, Euthymic mood. Full Affect.

## 2018-12-27 LAB — CBC WITH DIFFERENTIAL/PLATELET
Basophils Absolute: 0 10*3/uL (ref 0.0–0.2)
Basos: 1 %
EOS (ABSOLUTE): 0.2 10*3/uL (ref 0.0–0.4)
Eos: 3 %
Hematocrit: 39.1 % (ref 34.0–46.6)
Hemoglobin: 13.5 g/dL (ref 11.1–15.9)
Immature Grans (Abs): 0 10*3/uL (ref 0.0–0.1)
Immature Granulocytes: 0 %
Lymphocytes Absolute: 2.3 10*3/uL (ref 0.7–3.1)
Lymphs: 33 %
MCH: 32.8 pg (ref 26.6–33.0)
MCHC: 34.5 g/dL (ref 31.5–35.7)
MCV: 95 fL (ref 79–97)
Monocytes Absolute: 0.6 10*3/uL (ref 0.1–0.9)
Monocytes: 8 %
Neutrophils Absolute: 3.8 10*3/uL (ref 1.4–7.0)
Neutrophils: 55 %
Platelets: 298 10*3/uL (ref 150–450)
RBC: 4.11 x10E6/uL (ref 3.77–5.28)
RDW: 12.5 % (ref 11.7–15.4)
WBC: 6.9 10*3/uL (ref 3.4–10.8)

## 2018-12-27 LAB — COMPREHENSIVE METABOLIC PANEL
ALT: 9 IU/L (ref 0–32)
AST: 11 IU/L (ref 0–40)
Albumin/Globulin Ratio: 1.2 (ref 1.2–2.2)
Albumin: 3.7 g/dL — ABNORMAL LOW (ref 3.8–4.9)
Alkaline Phosphatase: 106 IU/L (ref 39–117)
BUN/Creatinine Ratio: 14 (ref 9–23)
BUN: 14 mg/dL (ref 6–24)
Bilirubin Total: 0.3 mg/dL (ref 0.0–1.2)
CO2: 24 mmol/L (ref 20–29)
Calcium: 9 mg/dL (ref 8.7–10.2)
Chloride: 101 mmol/L (ref 96–106)
Creatinine, Ser: 1 mg/dL (ref 0.57–1.00)
GFR calc Af Amer: 72 mL/min/{1.73_m2} (ref >59)
GFR calc non Af Amer: 63 mL/min/{1.73_m2} (ref >59)
Globulin, Total: 3.1 g/dL (ref 1.5–4.5)
Glucose: 144 mg/dL — ABNORMAL HIGH (ref 65–99)
Potassium: 4.6 mmol/L (ref 3.5–5.2)
Sodium: 140 mmol/L (ref 134–144)
Total Protein: 6.8 g/dL (ref 6.0–8.5)

## 2018-12-27 LAB — LIPID PANEL
Chol/HDL Ratio: 4.5 ratio — ABNORMAL HIGH (ref 0.0–4.4)
Cholesterol, Total: 208 mg/dL — ABNORMAL HIGH (ref 100–199)
HDL: 46 mg/dL (ref >39)
LDL Chol Calc (NIH): 145 mg/dL — ABNORMAL HIGH (ref 0–99)
Triglycerides: 95 mg/dL (ref 0–149)
VLDL Cholesterol Cal: 17 mg/dL (ref 5–40)

## 2018-12-27 LAB — HEMOGLOBIN A1C
Est. average glucose Bld gHb Est-mCnc: 154 mg/dL
Hgb A1c MFr Bld: 7 % — ABNORMAL HIGH (ref 4.8–5.6)

## 2018-12-27 LAB — VITAMIN D 25 HYDROXY (VIT D DEFICIENCY, FRACTURES): Vit D, 25-Hydroxy: 16.6 ng/mL — ABNORMAL LOW (ref 30.0–100.0)

## 2018-12-27 LAB — T4, FREE: Free T4: 1.35 ng/dL (ref 0.82–1.77)

## 2018-12-27 LAB — T3: T3, Total: 99 ng/dL (ref 71–180)

## 2018-12-27 LAB — TSH: TSH: 1.79 u[IU]/mL (ref 0.450–4.500)

## 2018-12-28 ENCOUNTER — Other Ambulatory Visit: Payer: Self-pay | Admitting: Family Medicine

## 2018-12-29 ENCOUNTER — Other Ambulatory Visit: Payer: Self-pay | Admitting: Family Medicine

## 2018-12-29 DIAGNOSIS — E559 Vitamin D deficiency, unspecified: Secondary | ICD-10-CM

## 2018-12-29 MED ORDER — VITAMIN D (ERGOCALCIFEROL) 1.25 MG (50000 UNIT) PO CAPS: ORAL_CAPSULE | ORAL | 3 refills | Status: DC

## 2018-12-31 NOTE — Telephone Encounter (Signed)
LVM informing pt.  Charyl Bigger, CMA

## 2019-01-02 ENCOUNTER — Ambulatory Visit: Payer: BC Managed Care – PPO | Admitting: Podiatry

## 2019-01-18 ENCOUNTER — Encounter

## 2019-03-07 ENCOUNTER — Encounter: Payer: Self-pay | Admitting: Internal Medicine

## 2019-03-09 ENCOUNTER — Other Ambulatory Visit: Payer: Self-pay | Admitting: Family Medicine

## 2019-03-09 DIAGNOSIS — I152 Hypertension secondary to endocrine disorders: Secondary | ICD-10-CM

## 2019-03-09 DIAGNOSIS — E1159 Type 2 diabetes mellitus with other circulatory complications: Secondary | ICD-10-CM

## 2019-03-09 DIAGNOSIS — E118 Type 2 diabetes mellitus with unspecified complications: Secondary | ICD-10-CM

## 2019-03-23 ENCOUNTER — Encounter (HOSPITAL_COMMUNITY): Payer: Self-pay | Admitting: Emergency Medicine

## 2019-03-23 ENCOUNTER — Emergency Department (HOSPITAL_COMMUNITY)
Admission: EM | Admit: 2019-03-23 | Discharge: 2019-03-24 | Disposition: A | Discharge status: Home / Self Care | Payer: BC Managed Care – PPO | Attending: Emergency Medicine | Admitting: Emergency Medicine

## 2019-03-23 ENCOUNTER — Other Ambulatory Visit: Payer: Self-pay

## 2019-03-23 DIAGNOSIS — Z87891 Personal history of nicotine dependence: Secondary | ICD-10-CM | POA: Insufficient documentation

## 2019-03-23 DIAGNOSIS — Z79899 Other long term (current) drug therapy: Secondary | ICD-10-CM | POA: Insufficient documentation

## 2019-03-23 DIAGNOSIS — Z7982 Long term (current) use of aspirin: Secondary | ICD-10-CM | POA: Insufficient documentation

## 2019-03-23 DIAGNOSIS — E119 Type 2 diabetes mellitus without complications: Secondary | ICD-10-CM | POA: Insufficient documentation

## 2019-03-23 DIAGNOSIS — M79605 Pain in left leg: Secondary | ICD-10-CM

## 2019-03-23 DIAGNOSIS — I10 Essential (primary) hypertension: Secondary | ICD-10-CM | POA: Insufficient documentation

## 2019-03-23 LAB — CBC
HCT: 41.7 % (ref 36.0–46.0)
Hemoglobin: 13.9 g/dL (ref 12.0–15.0)
MCH: 32.4 pg (ref 26.0–34.0)
MCHC: 33.3 g/dL (ref 30.0–36.0)
MCV: 97.2 fL (ref 80.0–100.0)
Platelets: 253 10*3/uL (ref 150–400)
RBC: 4.29 MIL/uL (ref 3.87–5.11)
RDW: 12.4 % (ref 11.5–15.5)
WBC: 6 10*3/uL (ref 4.0–10.5)
nRBC: 0 % (ref 0.0–0.2)

## 2019-03-23 LAB — BASIC METABOLIC PANEL
Anion gap: 9 (ref 5–15)
BUN: 10 mg/dL (ref 6–20)
CO2: 26 mmol/L (ref 22–32)
Calcium: 9.2 mg/dL (ref 8.9–10.3)
Chloride: 102 mmol/L (ref 98–111)
Creatinine, Ser: 0.9 mg/dL (ref 0.44–1.00)
GFR calc Af Amer: >60 mL/min (ref >60)
GFR calc non Af Amer: >60 mL/min (ref >60)
Glucose, Bld: 274 mg/dL — ABNORMAL HIGH (ref 70–99)
Potassium: 4.1 mmol/L (ref 3.5–5.1)
Sodium: 137 mmol/L (ref 135–145)

## 2019-03-23 NOTE — ED Triage Notes (Signed)
Pt reports left leg pain, swelling and discoloration for 2 months. Reports wearing a boot on left side due to a bruised bone.

## 2019-03-24 ENCOUNTER — Ambulatory Visit (HOSPITAL_COMMUNITY): Admission: RE | Admit: 2019-03-24 | Payer: Self-pay | Source: Ambulatory Visit

## 2019-03-24 MED ORDER — ENOXAPARIN SODIUM 100 MG/ML ~~LOC~~ SOLN
1.0000 mg/kg | Freq: Once | SUBCUTANEOUS | Status: AC
  Administered 2019-03-24: 01:00:00 100 mg via SUBCUTANEOUS
  Filled 2019-03-24: qty 1

## 2019-03-24 MED ORDER — OZEMPIC (1 MG/DOSE) 2 MG/1.5ML ~~LOC~~ SOPN: 1.0000 mg | PEN_INJECTOR | SUBCUTANEOUS | 0 refills | Status: DC

## 2019-03-24 NOTE — ED Notes (Signed)
Pt able to ambulate around room without assist. Limp to the right side noted, pt endorses sharp right leg pain.

## 2019-03-24 NOTE — ED Provider Notes (Signed)
[facility] EMERGENCY DEPARTMENT Provider Note   CSN: [id number] Arrival date & time: 03/23/19  1549     History Chief Complaint  Patient presents with  . Leg Swelling    Julie Prince is a 59 y.o. female.  The history is provided by the patient and medical records.    59 y.o. F with hx of seasonal allergies, cataracts, HTN, GERD, HLP, inappropriate sinus tachycardia, DM2, PAD, presenting to the ED for left leg pain.  States she was wearing a CAM walker from May 2020 until about 2 weeks ago due to a "bone bruise".  States even while wearing the boot she continued moving around normally, carrying on her normal activities.  States for the past 2 weeks she has had some intermittent swelling and discoloration to her left lower leg.  States this is worse in the evening, better in the morning.  Swelling does improve with elevating her legs.  States she has increased pain when walking, sometimes shooting up and down her entire leg.  No fever, chills, sweats.  No chest pain or SOB.  States she talked with her daughter about this and they were concerned for blood clot.  She denies hx of DVT or PE.  No family history of clotting disorder.  No recent travel, exogenous estrogens, etc.  Past Medical History:  Diagnosis Date  . Allergy   . Barrett esophagus   . Cataract    left cataract removal  . Essential hypertension, benign 09/12/2013  . GERD (gastroesophageal reflux disease)   . Hyperlipidemia 09/12/2013  . 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 Type 2 diabetes mellitus ([place]) 09/12/2013   Dr. Posey Pronto at [place]     Patient Active Problem List   Diagnosis Date Noted  . Stress due to illness of family member 09/05/2018  . PAD (peripheral artery disease) [facility])-  See's Cards- Dr Elaina Pattee [place]/ that group for this condition 05/28/2018  . Foot pain, left 04/20/2018  . Trigger ring finger 04/20/2018  . Diabetic retinopathy associated with diabetes mellitus due  to underlying condition ([place]) 04/20/2018  . 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 Diabetes mellitus due to underlying condition with stage 3 chronic kidney disease, without long-term current use of insulin ([place]) 07/12/2017  . Mixed diabetic hyperlipidemia associated with type 2 diabetes mellitus ([place]) 07/12/2017  . Hypertension associated with diabetes ([place]) 07/12/2017  . Obesity, Class III, morbid obesity  ([place]) 07/12/2017  . H/O medication noncompliance 07/12/2017  . Barrett's esophagus 03/11/2016  . 1st degree AV block 03/01/2016  . Tachycardia with hypertension 03/01/2016  . Dysphagia 02/10/2016  . Vulvar abscess   . Sepsis ([place]) 06/21/2015  . GERD (gastroesophageal reflux disease) 07/31/2014  . Adenomatous polyp of colon 04/03/2014  . Allergic rhinitis 01/23/2014  . CTS (carpal tunnel syndrome) 10/03/2013  . Uncontrolled type 2 diabetes mellitus with hyperglycemia ([place]) 10/03/2013  . Low back pain 10/03/2013  . Lumbar arthropathy 10/03/2013  . Lumbar degenerative disc disease 10/03/2013  . Neuropathy 10/03/2013  . Osteoarthritis 10/03/2013  . 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 Tenosynovitis of foot 10/03/2013  . Currently controlled diabetes mellitus type 2 with complications ([place]) 99991111  . Hyperlipidemia 09/12/2013  . Essential hypertension, benign 09/12/2013  . Diabetic peripheral neuropathy associated with type 2 diabetes mellitus ([place]) 09/12/2013    Past Surgical History:  Procedure Laterality Date  . ABDOMINAL AORTOGRAM W/LOWER EXTREMITY N/A 06/06/2018   Procedure: ABDOMINAL AORTOGRAM W/LOWER EXTREMITY;  Surgeon: Wellington Hampshire, MD;  Location: [facility];  Service: Cardiovascular;  Laterality: N/A;  . APPENDECTOMY    . CATARACT EXTRACTION Left   . CESAREAN SECTION  1990  . COLONOSCOPY    . right tube and ovary removed    . WISDOM TOOTH EXTRACTION       OB History    Gravida  4   Para  3   Term  2   Preterm  1   AB  1   Living    3     SAB  1   TAB  0   Ectopic  0   Multiple  0   Live Births  3           Family History  Problem Relation Age of Onset  . Lung cancer Mother   . Stroke Father   . Heart disease Maternal Aunt   . Fibromyalgia Sister   . Alzheimer's disease Maternal Grandmother   . Colon cancer Neg Hx   . Esophageal cancer Neg Hx   . Rectal cancer Neg Hx   . Stomach cancer Neg Hx   . Pancreatic cancer Neg Hx     Social History   Tobacco Use  . Smoking status: Former Smoker    Packs/day: 0.50  . Smokeless tobacco: Never Used  . Tobacco comment: quit 30 plus years ago.  Substance Use Topics  . Alcohol use: No    Alcohol/week: 0.0 standard drinks  . Drug use: No    Home Medications Prior to Admission medications   Medication Sig Start Date End Date Taking? Authorizing Provider  aspirin 81 MG chewable tablet Chew by mouth daily.    [provider]  atorvastatin (LIPITOR) 40 MG tablet Take 1 tablet (40 mg total) by mouth at bedtime. 09/05/18   Opalski, Neoma Laming, DO  cilostazol (PLETAL) 50 MG tablet TAKE 1 TABLET BY MOUTH TWICE A DAY 12/26/18   Wellington Hampshire, MD  Continuous Blood Gluc Sensor (FREESTYLE LIBRE 14 DAY SENSOR) MISC TEST DAILY AS DIRECTED AND CHANGE EVERY 14 DAYS 06/06/18   [provider]  losartan (COZAAR) 50 MG tablet Take 1 tablet (50 mg total) by mouth daily. 12/26/18 03/26/19  Opalski, Neoma Laming, DO  metoprolol tartrate (LOPRESSOR) 50 MG tablet Take 1.5 tablets (75 mg total) by mouth 2 (two) times daily. 12/18/18   Wellington Hampshire, MD  Semaglutide, 1 MG/DOSE, (OZEMPIC, 1 MG/DOSE,) 2 MG/1.5ML SOPN Inject 1 mg into the skin once a week. 05/28/18   Opalski, Deborah, DO  TRESIBA FLEXTOUCH 100 UNIT/ML SOPN FlexTouch Pen INJECT 50 UNITS SUBCUTANEOUS AT BEDTIME 12/29/18   Opalski, Deborah, DO  Vitamin D, Ergocalciferol, (DRISDOL) 1.25 MG (50000 UT) CAPS capsule Take one tablet wkly 12/29/18   Opalski, Deborah, DO    Allergies    Penicillins and Metformin  and related  Review of Systems   Review of Systems  Musculoskeletal: Positive for arthralgias.  All other systems reviewed and are negative.   Physical Exam Updated Vital Signs BP (!) 171/92   Pulse (!) 112   Temp 98.7 F (37.1 C) (Oral)   Resp 18   Ht 5\' 4"  (1.626 m)   Wt 97.5 kg   SpO2 100%   BMI 36.90 kg/m   Physical Exam Vitals and nursing note reviewed.  Constitutional:      Appearance: She is well-developed.  HENT:     Head: Normocephalic and atraumatic.  Eyes:     Conjunctiva/sclera: Conjunctivae normal.     Pupils: Pupils are equal, round,  and reactive to light.  Cardiovascular:     Rate and Rhythm: Normal rate and regular rhythm.     Heart sounds: Normal heart sounds.  Pulmonary:     Effort: Pulmonary effort is normal.     Breath sounds: Normal breath sounds.  Abdominal:     General: Bowel sounds are normal.     Palpations: Abdomen is soft.  Musculoskeletal:        General: Normal range of motion.     Cervical back: Normal range of motion.     Comments: Left leg is grossly normal in appearance without significant asymmetry or overlying skin changes, no warmth to touch, open wounds/sores; there is some tenderness along the mid-calf without noted bulge or palpable cord; DP pulse intact, moving toes normally, foot is warm and well perfused  Skin:    General: Skin is warm and dry.  Neurological:     Mental Status: She is alert and oriented to person, place, and time.     ED Results / Procedures / Treatments   Labs (all labs ordered are listed, but only abnormal results are displayed) Labs Reviewed  BASIC METABOLIC PANEL - Abnormal; Notable for the following components:      Result Value   Glucose, Bld 274 (*)    All other components within normal limits  CBC    EKG None  Radiology No results found.  Procedures Procedures (including critical care time)  Medications Ordered in ED Medications  enoxaparin (LOVENOX) injection 100 mg (100 mg  Subcutaneous Given 03/24/19 0121)    ED Course  I have reviewed the triage vital signs and the nursing notes.  Pertinent labs & imaging results that were available during my care of the patient were reviewed by me and considered in my medical decision making (see chart for details).    MDM Rules/Calculators/A&P    59 year old female presenting to the ED with left leg pain and intermittent swelling.  She was wearing a cam walker for several months due to what sounds like a bony contusion of her left lower leg/ankle.  States she just came out of the boot about 2 weeks ago and since that time has had intermittent pain in the left calf and intermittent swelling of the left leg.  This does improve with elevation.  She is not had any fevers or chills.  No chest pain or shortness of breath.  She is afebrile and nontoxic in appearance here.  Exam without any significant asymmetry or overlying skin changes of the left leg.  There is some tenderness of the proximal calf without noted bulge or palpable cord.  Her leg is neurovascular intact, foot is warm and well-perfused.  Screening labs are overall reassuring.  She does have hyperglycemia with glucose of 274 but no evidence of DKA.  She informed me that she recently lost her insurance and has been unable to follow-up with her primary care doctor to get refills of her Ozempic pens.  Patient has no history of DVT or PE, however has concern for such.  Vascular study unable to be obtained at this hour so we will arrange for this to be done in the morning.  She was given dose of Lovenox here.  She understands to follow-up in a.m. to have this done, will be directed back to the ER if any abnormal results, otherwise can follow-up with PCP.  She was given information for the wellness clinic since she no longer has insurance.  Patient appears stable for  discharge home.  She will return here for any new or acute changes.  Of note, patient is tachycardic here in the 110's.   She has a history of same with diagnosis of inappropriate sinus tachycardia.  She adamantly denies any chest pain or shortness of breath so do not feel PE workup indicated at this time.  Final Clinical Impression(s) / ED Diagnoses Final diagnoses:  Left leg pain    Rx / DC Orders ED Discharge Orders         Ordered    LE VENOUS     03/24/19 0207    Semaglutide, 1 MG/DOSE, (OZEMPIC, 1 MG/DOSE,) 2 MG/1.5ML SOPN  Weekly     03/24/19 0209           Larene Pickett, PA-C 03/24/19 0216    Ward, Delice Bison, DO 03/24/19 [phone]

## 2019-03-24 NOTE — Discharge Instructions (Signed)
Take the prescribed medication as directed. Follow-up with [facility] and wellness clinic-- call to make an appt. Return to the ED for new or worsening symptoms.

## 2019-04-15 ENCOUNTER — Encounter: Payer: BC Managed Care – PPO | Admitting: Family Medicine

## 2019-06-21 ENCOUNTER — Telehealth: Payer: Self-pay | Admitting: Cardiovascular Disease

## 2019-06-21 NOTE — Telephone Encounter (Signed)
Left message for patient to call and get 6 month follow up appointment scheduled with Dr. Fletcher Anon.

## 2019-08-06 ENCOUNTER — Encounter: Payer: Self-pay | Admitting: General Practice

## 2019-08-14 ENCOUNTER — Encounter (HOSPITAL_COMMUNITY): Payer: Self-pay

## 2019-08-14 ENCOUNTER — Emergency Department (HOSPITAL_COMMUNITY)
Admission: EM | Admit: 2019-08-14 | Discharge: 2019-08-14 | Disposition: A | Discharge status: Home / Self Care | Payer: Self-pay | Attending: Emergency Medicine | Admitting: Emergency Medicine

## 2019-08-14 ENCOUNTER — Emergency Department (HOSPITAL_COMMUNITY): Payer: Self-pay

## 2019-08-14 ENCOUNTER — Other Ambulatory Visit: Payer: Self-pay

## 2019-08-14 DIAGNOSIS — I10 Essential (primary) hypertension: Secondary | ICD-10-CM | POA: Insufficient documentation

## 2019-08-14 DIAGNOSIS — Z794 Long term (current) use of insulin: Secondary | ICD-10-CM | POA: Insufficient documentation

## 2019-08-14 DIAGNOSIS — Z7982 Long term (current) use of aspirin: Secondary | ICD-10-CM | POA: Insufficient documentation

## 2019-08-14 DIAGNOSIS — Z79899 Other long term (current) drug therapy: Secondary | ICD-10-CM | POA: Insufficient documentation

## 2019-08-14 DIAGNOSIS — E119 Type 2 diabetes mellitus without complications: Secondary | ICD-10-CM | POA: Insufficient documentation

## 2019-08-14 DIAGNOSIS — L03116 Cellulitis of left lower limb: Secondary | ICD-10-CM | POA: Insufficient documentation

## 2019-08-14 LAB — CBC WITH DIFFERENTIAL/PLATELET
Abs Immature Granulocytes: 0.02 10*3/uL (ref 0.00–0.07)
Basophils Absolute: 0 10*3/uL (ref 0.0–0.1)
Basophils Relative: 0 %
Eosinophils Absolute: 0.2 10*3/uL (ref 0.0–0.5)
Eosinophils Relative: 2 %
HCT: 40.9 % (ref 36.0–46.0)
Hemoglobin: 14 g/dL (ref 12.0–15.0)
Immature Granulocytes: 0 %
Lymphocytes Relative: 17 %
Lymphs Abs: 1.4 10*3/uL (ref 0.7–4.0)
MCH: 33.3 pg (ref 26.0–34.0)
MCHC: 34.2 g/dL (ref 30.0–36.0)
MCV: 97.1 fL (ref 80.0–100.0)
Monocytes Absolute: 0.9 10*3/uL (ref 0.1–1.0)
Monocytes Relative: 11 %
Neutro Abs: 5.8 10*3/uL (ref 1.7–7.7)
Neutrophils Relative %: 70 %
Platelets: 228 10*3/uL (ref 150–400)
RBC: 4.21 MIL/uL (ref 3.87–5.11)
RDW: 12.1 % (ref 11.5–15.5)
WBC: 8.4 10*3/uL (ref 4.0–10.5)
nRBC: 0 % (ref 0.0–0.2)

## 2019-08-14 LAB — BASIC METABOLIC PANEL
Anion gap: 9 (ref 5–15)
BUN: 11 mg/dL (ref 6–20)
CO2: 29 mmol/L (ref 22–32)
Calcium: 9.1 mg/dL (ref 8.9–10.3)
Chloride: 101 mmol/L (ref 98–111)
Creatinine, Ser: 0.8 mg/dL (ref 0.44–1.00)
GFR calc Af Amer: >60 mL/min (ref >60)
GFR calc non Af Amer: >60 mL/min (ref >60)
Glucose, Bld: 200 mg/dL — ABNORMAL HIGH (ref 70–99)
Potassium: 3.7 mmol/L (ref 3.5–5.1)
Sodium: 139 mmol/L (ref 135–145)

## 2019-08-14 MED ORDER — DOXYCYCLINE HYCLATE 100 MG PO CAPS: 100.0000 mg | ORAL_CAPSULE | Freq: Two times a day (BID) | ORAL | 0 refills | Status: DC

## 2019-08-14 MED ORDER — DOXYCYCLINE HYCLATE 100 MG PO TABS
100.0000 mg | ORAL_TABLET | Freq: Once | ORAL | Status: AC
  Administered 2019-08-14: 100 mg via ORAL
  Filled 2019-08-14: qty 1

## 2019-08-14 MED ORDER — CEPHALEXIN 250 MG PO CAPS
500.0000 mg | ORAL_CAPSULE | Freq: Once | ORAL | Status: AC
  Administered 2019-08-14: 500 mg via ORAL
  Filled 2019-08-14: qty 2

## 2019-08-14 MED ORDER — CEPHALEXIN 500 MG PO CAPS: 500.0000 mg | ORAL_CAPSULE | Freq: Four times a day (QID) | ORAL | 0 refills | Status: AC

## 2019-08-14 NOTE — ED Triage Notes (Signed)
Pt c.o left little toe pain, states she has been wearing a boot for the past month and it has been rubbing on her toe, pressure sore noted to toe. Second toe is bruised, pt ambulatory

## 2019-08-14 NOTE — ED Provider Notes (Signed)
[facility] EMERGENCY DEPARTMENT Provider Note   CSN: [id number] Arrival date & time: 08/14/19  [id number]     History Chief Complaint  Patient presents with  . Toe Pain    Julie Prince is a 59 y.o. female with past medical history significant for GERD, type 2 diabetes on insulin presents to emergency department today with chief complaint of left foot pain x 2 weeks.  She also reports getting a pedicure and was accidentally cut with a tool while getting her cuticles trimmed. She admits to superficial wounds on her left great toe and her pinky toe. She has noticed minimal purulent drainage from the wound on her pinky toe. She has bruising around her second toe nail as well. She reports swelling in her left foot, unsure how long that has been going on for. She wears heels and dress shoes to church and states that makes her toe pain worse. She is describing the pain as a pressure sensation. She rates pain 6/10 in severity. She states her foot feels full as if it needs to be popped. She can ambulates however reports pain is worse when doing so. She denies any fever or chills, numbness, tingling, decreases sensation. She also denies any fall or other injury to the foot.  Past Medical History:  Diagnosis Date  . Allergy   . Barrett esophagus   . Cataract    left cataract removal  . Essential hypertension, benign 09/12/2013  . GERD (gastroesophageal reflux disease)   . Hyperlipidemia 09/12/2013  . 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 Type 2 diabetes mellitus ([place]) 09/12/2013   Dr. Posey Pronto at [place]     Patient Active Problem List   Diagnosis Date Noted  . Stress due to illness of family member 09/05/2018  . PAD (peripheral artery disease) [facility])-  See's Cards- Dr Elaina Pattee [place]/ that group for this condition 05/28/2018  . Foot pain, left 04/20/2018  . Trigger ring finger 04/20/2018  . Diabetic retinopathy associated with diabetes mellitus due to underlying  condition ([place]) 04/20/2018  . 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 Diabetes mellitus due to underlying condition with stage 3 chronic kidney disease, without long-term current use of insulin ([place]) 07/12/2017  . Mixed diabetic hyperlipidemia associated with type 2 diabetes mellitus ([place]) 07/12/2017  . Hypertension associated with diabetes ([place]) 07/12/2017  . Obesity, Class III, morbid obesity  ([place]) 07/12/2017  . H/O medication noncompliance 07/12/2017  . Barrett's esophagus 03/11/2016  . 1st degree AV block 03/01/2016  . Tachycardia with hypertension 03/01/2016  . Dysphagia 02/10/2016  . Vulvar abscess   . Sepsis ([place]) 06/21/2015  . GERD (gastroesophageal reflux disease) 07/31/2014  . Adenomatous polyp of colon 04/03/2014  . Allergic rhinitis 01/23/2014  . CTS (carpal tunnel syndrome) 10/03/2013  . Uncontrolled type 2 diabetes mellitus with hyperglycemia ([place]) 10/03/2013  . Low back pain 10/03/2013  . Lumbar arthropathy 10/03/2013  . Lumbar degenerative disc disease 10/03/2013  . Neuropathy 10/03/2013  . Osteoarthritis 10/03/2013  . 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 Tenosynovitis of foot 10/03/2013  . Currently controlled diabetes mellitus type 2 with complications ([place]) 99991111  . Hyperlipidemia 09/12/2013  . Essential hypertension, benign 09/12/2013  . Diabetic peripheral neuropathy associated with type 2 diabetes mellitus ([place]) 09/12/2013    Past Surgical History:  Procedure Laterality Date  . ABDOMINAL AORTOGRAM W/LOWER EXTREMITY N/A 06/06/2018   Procedure: ABDOMINAL AORTOGRAM W/LOWER EXTREMITY;  Surgeon: Wellington Hampshire, MD;  Location: [facility] LAB;  Service: Cardiovascular;  Laterality: N/A;  .  APPENDECTOMY    . CATARACT EXTRACTION Left   . CESAREAN SECTION  1990  . COLONOSCOPY    . right tube and ovary removed    . WISDOM TOOTH EXTRACTION       OB History    Gravida  4   Para  3   Term  2   Preterm  1   AB  1   Living  3     SAB    1   TAB  0   Ectopic  0   Multiple  0   Live Births  3           Family History  Problem Relation Age of Onset  . Lung cancer Mother   . Stroke Father   . Heart disease Maternal Aunt   . Fibromyalgia Sister   . Alzheimer's disease Maternal Grandmother   . Colon cancer Neg Hx   . Esophageal cancer Neg Hx   . Rectal cancer Neg Hx   . Stomach cancer Neg Hx   . Pancreatic cancer Neg Hx     Social History   Tobacco Use  . Smoking status: Former Smoker    Packs/day: 0.50  . Smokeless tobacco: Never Used  . Tobacco comment: quit 30 plus years ago.  Substance Use Topics  . Alcohol use: No    Alcohol/week: 0.0 standard drinks  . Drug use: No    Home Medications Prior to Admission medications   Medication Sig Start Date End Date Taking? Authorizing Provider  aspirin 81 MG chewable tablet Chew by mouth daily.   Yes [provider]  metoprolol tartrate (LOPRESSOR) 50 MG tablet Take 1.5 tablets (75 mg total) by mouth 2 (two) times daily. Patient taking differently: Take 50 mg by mouth 2 (two) times daily.  12/18/18  Yes Arida, Mertie Clause, MD  TRESIBA FLEXTOUCH 100 UNIT/ML SOPN FlexTouch Pen INJECT 50 UNITS SUBCUTANEOUS AT BEDTIME Patient taking differently: Inject 50 Units into the skin at bedtime.  12/29/18  Yes Opalski, Neoma Laming, DO  Turmeric (QC TUMERIC COMPLEX PO) Take 2 tablets by mouth in the morning and at bedtime.   Yes [provider]  atorvastatin (LIPITOR) 40 MG tablet Take 1 tablet (40 mg total) by mouth at bedtime. Patient not taking: Reported on 08/14/2019 09/05/18   Mellody Dance, DO  cilostazol (PLETAL) 50 MG tablet TAKE 1 TABLET BY MOUTH TWICE A DAY Patient not taking: Reported on 08/14/2019 12/26/18   Wellington Hampshire, MD  Continuous Blood Gluc Sensor (FREESTYLE LIBRE 14 DAY SENSOR) MISC TEST DAILY AS DIRECTED AND CHANGE EVERY 14 DAYS 06/06/18   [provider]  doxycycline (VIBRAMYCIN) 100 MG capsule Take 1 capsule (100 mg total)  by mouth 2 (two) times daily. 08/14/19   Kielan Dreisbach E, PA-C  losartan (COZAAR) 50 MG tablet Take 1 tablet (50 mg total) by mouth daily. Patient not taking: Reported on 08/14/2019 12/26/18 03/26/19  Mellody Dance, DO  Semaglutide, 1 MG/DOSE, (OZEMPIC, 1 MG/DOSE,) 2 MG/1.5ML SOPN Inject 1 mg into the skin once a week. Patient not taking: Reported on 08/14/2019 03/24/19   Larene Pickett, PA-C  Vitamin D, Ergocalciferol, (DRISDOL) 1.25 MG (50000 UT) CAPS capsule Take one tablet wkly Patient not taking: Reported on 08/14/2019 12/29/18   Mellody Dance, DO    Allergies    Penicillins and Metformin and related  Review of Systems   Review of Systems  All other systems are reviewed and are negative for acute change except as  noted in the HPI.   Physical Exam Updated Vital Signs BP (!) 118/100 (BP Location: Right Arm)   Pulse 97   Temp 98.7 F (37.1 C) (Oral)   Resp 16   Ht 5\' 4"  (1.626 m)   Wt 97.5 kg   SpO2 100%   BMI 36.90 kg/m   Physical Exam Vitals and nursing note reviewed.  Constitutional:      Appearance: She is well-developed. She is not ill-appearing or toxic-appearing.  HENT:     Head: Normocephalic and atraumatic.     Nose: Nose normal.  Eyes:     General: No scleral icterus.       Right eye: No discharge.        Left eye: No discharge.     Conjunctiva/sclera: Conjunctivae normal.  Neck:     Vascular: No JVD.  Cardiovascular:     Rate and Rhythm: Normal rate and regular rhythm.     Pulses: Normal pulses.          Dorsalis pedis pulses are 2+ on the left side.     Heart sounds: Normal heart sounds.  Pulmonary:     Effort: Pulmonary effort is normal.     Breath sounds: Normal breath sounds.  Abdominal:     General: There is no distension.  Musculoskeletal:        General: Normal range of motion.     Cervical back: Normal range of motion.  Feet:     Comments: Please see media below.  Swelling noted to left foot that does not extend to calf. She has  ecchymosis noted to second toe around the nail. She has hard callous on top of left great toe nail. She has ulcerated wound on left pinky toe. No active drainage. Sensation is intact to sharp and dull on left foot. Skin:    General: Skin is warm and dry.  Neurological:     Mental Status: She is oriented to person, place, and time.     GCS: GCS eye subscore is 4. GCS verbal subscore is 5. GCS motor subscore is 6.     Comments: Fluent speech, no facial droop.  Psychiatric:        Behavior: Behavior normal.       ED Results / Procedures / Treatments   Labs (all labs ordered are listed, but only abnormal results are displayed) Labs Reviewed  BASIC METABOLIC PANEL - Abnormal; Notable for the following components:      Result Value   Glucose, Bld 200 (*)    All other components within normal limits  CBC WITH DIFFERENTIAL/PLATELET    EKG None  Radiology DG Foot Complete Left  Result Date: 08/14/2019 CLINICAL DATA:  Bruising, pain, infection, skin discoloration, second through fifth digits EXAM: LEFT FOOT - COMPLETE 3+ VIEW COMPARISON:  07/30/2018 FINDINGS: There is no evidence of fracture or dislocation. There is no evidence of arthropathy or other focal bone abnormality. Soft tissue edema about the forefoot. Vascular calcinosis. IMPRESSION: No fracture or dislocation of the left foot. No bony erosion or sclerosis to suggest osteomyelitis. Soft tissue edema. Consider MRI to more sensitively assess for bone marrow edema and osteomyelitis if clinically suspected. Electronically Signed   By: Eddie Candle M.D.   On: 08/14/2019 08:27    Procedures Procedures (including critical care time)  Medications Ordered in ED Medications  cephALEXin (KEFLEX) capsule 500 mg (500 mg Oral Given 08/14/19 1215)  doxycycline (VIBRA-TABS) tablet 100 mg (100 mg Oral Given 08/14/19 1215)  ED Course  I have reviewed the triage vital signs and the nursing notes.  Pertinent labs & imaging results that  were available during my care of the patient were reviewed by me and considered in my medical decision making (see chart for details).    MDM Rules/Calculators/A&P                      History provided by patient with additional history obtained from chart review.    Patient seen and examined. Patient presents awake, alert, hemodynamically stable, afebrile, non toxic. No tachycardia. Patient has what looks to be cellulitis on her left foot. She has erythema and ulceration on left pinky toe without any active drainage currently. The foot is not warm to the touch. DP pulses 2+. There is swelling noted to the dorsum of left foot. Compartments are soft. She is able to ambulate with out any difficulty. X-ray performed in triage. I viewed results which showed no fractures or dislocations.  Labs show no leukocytosis, no anemia, no severe electrolyte derangement, no renal insufficiency.  Glucose elevated at 200.  Anion gap is normal.  Erythema circled with skin marker. Discussed with ED pharmacist as patient has penicillin allergy of hives and shortness of breath.  Pharmacist agrees with plan for Keflex and doxycycline.  Keflex has low cross-reactivity.  Patient given Keflex here in the emergency department and had no adverse reactions.  At this time osteomyelitis is felt to be less likely given her presentation. Will send ambulatory referral to the wound care clinic. Will also have patient follow-up with PCP in 2 days for a wound check.  The patient appears reasonably screened and/or stabilized for discharge and I doubt any other medical condition or other [facility] requiring further screening, evaluation, or treatment in the ED at this time prior to discharge. The patient is safe for discharge with strict return precautions discussed. Patient appears reliable for follow-up. The patient was discussed with and seen by Dr. Kathrynn Humble who agrees with the treatment plan.   Portions of this note were generated with  [employer]obby[employer]. Dictation errors may occur despite best attempts at proofreading.    Final Clinical Impression(s) / ED Diagnoses Final diagnoses:  Cellulitis of left lower extremity    Rx / DC Orders ED Discharge Orders         Ordered    Ambulatory referral to Wound Clinic     08/14/19 1105    doxycycline (VIBRAMYCIN) 100 MG capsule  2 times daily     08/14/19 1146           Laurali Goddard, [place] Noe 08/14/19 1236    Varney Biles, MD 08/15/19 1153

## 2019-08-14 NOTE — Discharge Instructions (Signed)
You have been seen today for foot infection. Please read and follow all provided instructions. Return to the emergency room for worsening condition or new concerning symptoms including fever, chills, worsening redness or pus draining from the wound.  1. Medications:  Prescription sent to your pharmacy for two antibiotics Keflex and doxycycline. You should take these as prescribed. They're used to help treat infection.  Continue usual home medications Take medications as prescribed. Please review all of the medicines and only take them if you do not have an allergy to them.   2. Treatment: rest, drink plenty of fluids  3. Follow Up:  Please follow up with primary care provider in 2 days for a wound check. -A referral has been sent to the wound clinic in [place]. We strongly recommend you see the doctors at the wound care center to get the wound examined and possibly debrided.   It is also a possibility that you have an allergic reaction to any of the medicines that you have been prescribed - Everybody reacts differently to medications and while MOST people have no trouble with most medicines, you may have a reaction such as nausea, vomiting, rash, swelling, shortness of breath. If this is the case, please stop taking the medicine immediately and contact your physician.  ?

## 2019-08-16 ENCOUNTER — Emergency Department (HOSPITAL_COMMUNITY)
Admission: EM | Admit: 2019-08-16 | Discharge: 2019-08-16 | Disposition: A | Discharge status: Home / Self Care | Payer: Self-pay | Attending: Emergency Medicine | Admitting: Emergency Medicine

## 2019-08-16 ENCOUNTER — Encounter (HOSPITAL_COMMUNITY): Payer: Self-pay | Admitting: Emergency Medicine

## 2019-08-16 DIAGNOSIS — Z794 Long term (current) use of insulin: Secondary | ICD-10-CM | POA: Insufficient documentation

## 2019-08-16 DIAGNOSIS — Z7982 Long term (current) use of aspirin: Secondary | ICD-10-CM | POA: Insufficient documentation

## 2019-08-16 DIAGNOSIS — E1122 Type 2 diabetes mellitus with diabetic chronic kidney disease: Secondary | ICD-10-CM | POA: Insufficient documentation

## 2019-08-16 DIAGNOSIS — I129 Hypertensive chronic kidney disease with stage 1 through stage 4 chronic kidney disease, or unspecified chronic kidney disease: Secondary | ICD-10-CM | POA: Insufficient documentation

## 2019-08-16 DIAGNOSIS — Z87891 Personal history of nicotine dependence: Secondary | ICD-10-CM | POA: Insufficient documentation

## 2019-08-16 DIAGNOSIS — L03116 Cellulitis of left lower limb: Secondary | ICD-10-CM | POA: Insufficient documentation

## 2019-08-16 DIAGNOSIS — N183 Chronic kidney disease, stage 3 unspecified: Secondary | ICD-10-CM | POA: Insufficient documentation

## 2019-08-16 DIAGNOSIS — Z79899 Other long term (current) drug therapy: Secondary | ICD-10-CM | POA: Insufficient documentation

## 2019-08-16 LAB — COMPREHENSIVE METABOLIC PANEL
ALT: 8 U/L (ref 0–44)
AST: 12 U/L — ABNORMAL LOW (ref 15–41)
Albumin: 3.1 g/dL — ABNORMAL LOW (ref 3.5–5.0)
Alkaline Phosphatase: 79 U/L (ref 38–126)
Anion gap: 11 (ref 5–15)
BUN: 11 mg/dL (ref 6–20)
CO2: 28 mmol/L (ref 22–32)
Calcium: 8.9 mg/dL (ref 8.9–10.3)
Chloride: 100 mmol/L (ref 98–111)
Creatinine, Ser: 0.96 mg/dL (ref 0.44–1.00)
GFR calc Af Amer: >60 mL/min (ref >60)
GFR calc non Af Amer: >60 mL/min (ref >60)
Glucose, Bld: 221 mg/dL — ABNORMAL HIGH (ref 70–99)
Potassium: 4.3 mmol/L (ref 3.5–5.1)
Sodium: 139 mmol/L (ref 135–145)
Total Bilirubin: 0.9 mg/dL (ref 0.3–1.2)
Total Protein: 6.6 g/dL (ref 6.5–8.1)

## 2019-08-16 LAB — CBC WITH DIFFERENTIAL/PLATELET
Abs Immature Granulocytes: 0.02 10*3/uL (ref 0.00–0.07)
Basophils Absolute: 0 10*3/uL (ref 0.0–0.1)
Basophils Relative: 1 %
Eosinophils Absolute: 0.2 10*3/uL (ref 0.0–0.5)
Eosinophils Relative: 3 %
HCT: 40.7 % (ref 36.0–46.0)
Hemoglobin: 13.7 g/dL (ref 12.0–15.0)
Immature Granulocytes: 0 %
Lymphocytes Relative: 29 %
Lymphs Abs: 1.9 10*3/uL (ref 0.7–4.0)
MCH: 32.5 pg (ref 26.0–34.0)
MCHC: 33.7 g/dL (ref 30.0–36.0)
MCV: 96.7 fL (ref 80.0–100.0)
Monocytes Absolute: 0.9 10*3/uL (ref 0.1–1.0)
Monocytes Relative: 13 %
Neutro Abs: 3.6 10*3/uL (ref 1.7–7.7)
Neutrophils Relative %: 54 %
Platelets: 234 10*3/uL (ref 150–400)
RBC: 4.21 MIL/uL (ref 3.87–5.11)
RDW: 12.1 % (ref 11.5–15.5)
WBC: 6.7 10*3/uL (ref 4.0–10.5)
nRBC: 0 % (ref 0.0–0.2)

## 2019-08-16 MED ORDER — METOPROLOL TARTRATE 25 MG PO TABS
50.0000 mg | ORAL_TABLET | Freq: Once | ORAL | Status: AC
  Administered 2019-08-16: 50 mg via ORAL
  Filled 2019-08-16: qty 2

## 2019-08-16 MED ORDER — SULFAMETHOXAZOLE-TRIMETHOPRIM 800-160 MG PO TABS
1.0000 | ORAL_TABLET | Freq: Once | ORAL | Status: AC
  Administered 2019-08-16: 1 via ORAL
  Filled 2019-08-16: qty 1

## 2019-08-16 MED ORDER — SULFAMETHOXAZOLE-TRIMETHOPRIM 800-160 MG PO TABS: 1.0000 | ORAL_TABLET | Freq: Two times a day (BID) | ORAL | 0 refills | Status: AC

## 2019-08-16 NOTE — Discharge Instructions (Addendum)
Your xray did not show bony infection (from 2 days ago) Your blood work today was reassuring Your exam shows a slight worsening of the infection I have discussed your care and antibiotics with the pharmacist They have recommended (and I agree with them), that we need to switch your antibiotics.  Continue Keflex (Cephalexin) Stop Doxycycline Start BActrim (twice daily for 7 days)  If you should develop increasing pain swelling or spreading redness or any fevers or chills or any other significant severe or changing symptoms please return to the emergency department and you may need to be admitted if this is getting worse.  At this time it appears that you can go home with the new medications and give it a try.  Please have your family doctor recheck you within 48 hours

## 2019-08-16 NOTE — ED Triage Notes (Addendum)
Pt reports she was seen wed, diagnosed with cellulitis, given abx but states that area on her lower L leg has gotten worse despite taking abx. Denies fevers or chills, tachycardic in 130s in triage, pt reports hx of tachycardia,states she did not take her medication this morning pta.

## 2019-08-16 NOTE — ED Provider Notes (Signed)
[facility] EMERGENCY DEPARTMENT Provider Note   CSN: [id number] Arrival date & time: 08/16/19  [id number]     History Chief Complaint  Patient presents with  . Cellulitis    Julie Prince is a 59 y.o. female.  HPI   This patient is a 59 year old female, she is known to have diabetes, she also has a known superficial ulcer on the left lower extremity over the small toe.  This has caused some increased redness which started to spread on top of the foot and for which she was treated with antibiotics including cephalexin and doxycycline 48 hours ago.  She presents today with worsening redness of the foot, there is no increase swelling and the patient does note that she has had chronic swelling of the foot secondary to an injury in the past, she no longer wears a boot immobilizer but has had chronic swelling.  She feels like the redness has extended beyond the outline that was drawn on her foot at her previous visit and is now extending up the leg.  Symptoms are persistent, gradually worsening, she is not having fevers or chills, she does have some slight increased pain in the foot.  She is a diabetic, she took her medications this morning, she did not take any of her daily medications other than her diabetic shot.  She also takes medication for tachycardia which she did not take  Past Medical History:  Diagnosis Date  . Allergy   . Barrett esophagus   . Cataract    left cataract removal  . Essential hypertension, benign 09/12/2013  . GERD (gastroesophageal reflux disease)   . Hyperlipidemia 09/12/2013  . 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 Type 2 diabetes mellitus ([place]) 09/12/2013   Dr. Posey Pronto at [facility]     Patient Active Problem List   Diagnosis Date Noted  . Stress due to illness of family member 09/05/2018  . PAD (peripheral artery disease) [facility])-  See's Cards- Dr Elaina Pattee [place]/ that group for this condition 05/28/2018  . Foot pain, left  04/20/2018  . Trigger ring finger 04/20/2018  . Diabetic retinopathy associated with diabetes mellitus due to underlying condition ([place]) 04/20/2018  . 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 Diabetes mellitus due to underlying condition with stage 3 chronic kidney disease, without long-term current use of insulin ([place]) 07/12/2017  . Mixed diabetic hyperlipidemia associated with type 2 diabetes mellitus ([place]) 07/12/2017  . Hypertension associated with diabetes ([place]) 07/12/2017  . Obesity, Class III, morbid obesity  ([place]) 07/12/2017  . H/O medication noncompliance 07/12/2017  . Barrett's esophagus 03/11/2016  . 1st degree AV block 03/01/2016  . Tachycardia with hypertension 03/01/2016  . Dysphagia 02/10/2016  . Vulvar abscess   . Sepsis ([place]) 06/21/2015  . GERD (gastroesophageal reflux disease) 07/31/2014  . Adenomatous polyp of colon 04/03/2014  . Allergic rhinitis 01/23/2014  . CTS (carpal tunnel syndrome) 10/03/2013  . Uncontrolled type 2 diabetes mellitus with hyperglycemia ([place]) 10/03/2013  . Low back pain 10/03/2013  . Lumbar arthropathy 10/03/2013  . Lumbar degenerative disc disease 10/03/2013  . Neuropathy 10/03/2013  . Osteoarthritis 10/03/2013  . 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 Tenosynovitis of foot 10/03/2013  . Currently controlled diabetes mellitus type 2 with complications ([place]) [id number]  . Hyperlipidemia 09/12/2013  . Essential hypertension, benign 09/12/2013  . Diabetic peripheral neuropathy associated with type 2 diabetes mellitus ([place]) 09/12/2013    Past Surgical History:  Procedure Laterality Date  . ABDOMINAL AORTOGRAM W/LOWER EXTREMITY N/A 06/06/2018  Procedure: ABDOMINAL AORTOGRAM W/LOWER EXTREMITY;  Surgeon: Wellington Hampshire, MD;  Location: [facility];  Service: Cardiovascular;  Laterality: N/A;  . APPENDECTOMY    . CATARACT EXTRACTION Left   . CESAREAN SECTION  1990  . COLONOSCOPY    . right tube and ovary removed    . WISDOM TOOTH  EXTRACTION       OB History    Gravida  4   Para  3   Term  2   Preterm  1   AB  1   Living  3     SAB  1   TAB  0   Ectopic  0   Multiple  0   Live Births  3           Family History  Problem Relation Age of Onset  . Lung cancer Mother   . Stroke Father   . Heart disease Maternal Aunt   . Fibromyalgia Sister   . Alzheimer's disease Maternal Grandmother   . Colon cancer Neg Hx   . Esophageal cancer Neg Hx   . Rectal cancer Neg Hx   . Stomach cancer Neg Hx   . Pancreatic cancer Neg Hx     Social History   Tobacco Use  . Smoking status: Former Smoker    Packs/day: 0.50  . Smokeless tobacco: Never Used  . Tobacco comment: quit 30 plus years ago.  Substance Use Topics  . Alcohol use: No    Alcohol/week: 0.0 standard drinks  . Drug use: No    Home Medications Prior to Admission medications   Medication Sig Start Date End Date Taking? Authorizing Provider  aspirin 81 MG chewable tablet Chew by mouth daily.   Yes [provider]  cephALEXin (KEFLEX) 500 MG capsule Take 1 capsule (500 mg total) by mouth 4 (four) times daily for 7 days. 08/14/19 08/21/19 Yes Albrizze, Kaitlyn E, PA-C  doxycycline (VIBRAMYCIN) 100 MG capsule Take 1 capsule (100 mg total) by mouth 2 (two) times daily. 08/14/19  Yes Albrizze, Kaitlyn E, PA-C  TRESIBA FLEXTOUCH 100 UNIT/ML SOPN FlexTouch Pen INJECT 50 UNITS SUBCUTANEOUS AT BEDTIME Patient taking differently: Inject 50 Units into the skin at bedtime.  12/29/18  Yes Opalski, Deborah, DO  atorvastatin (LIPITOR) 40 MG tablet Take 1 tablet (40 mg total) by mouth at bedtime. Patient not taking: Reported on 08/14/2019 09/05/18   Mellody Dance, DO  cilostazol (PLETAL) 50 MG tablet TAKE 1 TABLET BY MOUTH TWICE A DAY Patient not taking: Reported on 08/14/2019 12/26/18   Wellington Hampshire, MD  Continuous Blood Gluc Sensor (FREESTYLE LIBRE 14 DAY SENSOR) MISC TEST DAILY AS DIRECTED AND CHANGE EVERY 14 DAYS 06/06/18   [provider]  losartan (COZAAR) 50 MG tablet Take 1 tablet (50 mg total) by mouth daily. Patient not taking: Reported on 08/14/2019 12/26/18 03/26/19  Mellody Dance, DO  metoprolol tartrate (LOPRESSOR) 50 MG tablet Take 1.5 tablets (75 mg total) by mouth 2 (two) times daily. Patient not taking: Reported on 08/16/2019 12/18/18   Wellington Hampshire, MD  Semaglutide, 1 MG/DOSE, (OZEMPIC, 1 MG/DOSE,) 2 MG/1.5ML SOPN Inject 1 mg into the skin once a week. Patient not taking: Reported on 08/14/2019 03/24/19   Larene Pickett, PA-C  sulfamethoxazole-trimethoprim (BACTRIM DS) 800-160 MG tablet Take 1 tablet by mouth 2 (two) times daily for 7 days. 08/16/19 08/23/19  Noemi Chapel, MD  Turmeric (QC TUMERIC COMPLEX PO) Take 2 tablets by mouth in the morning and  at bedtime.    [provider]  Vitamin D, Ergocalciferol, (DRISDOL) 1.25 MG (50000 UT) CAPS capsule Take one tablet wkly Patient not taking: Reported on 08/14/2019 12/29/18   Mellody Dance, DO    Allergies    Penicillins and Metformin and related  Review of Systems   Review of Systems  All other systems reviewed and are negative.   Physical Exam Updated Vital Signs BP (!) 153/105 (BP Location: Right Arm)   Pulse (!) 123   Temp 98.8 F (37.1 C) (Oral)   Resp 17   Ht 1.626 m (5\' 4" )   Wt 97.5 kg   SpO2 100%   BMI 36.90 kg/m   Physical Exam Vitals and nursing note reviewed.  Constitutional:      General: She is not in acute distress.    Appearance: She is well-developed.  HENT:     Head: Normocephalic and atraumatic.     Mouth/Throat:     Pharynx: No oropharyngeal exudate.  Eyes:     General: No scleral icterus.       Right eye: No discharge.        Left eye: No discharge.     Conjunctiva/sclera: Conjunctivae normal.     Pupils: Pupils are equal, round, and reactive to light.  Neck:     Thyroid: No thyromegaly.     Vascular: No JVD.  Cardiovascular:     Rate and Rhythm: Regular rhythm. Tachycardia present.      Heart sounds: Normal heart sounds. No murmur. No friction rub. No gallop.   Pulmonary:     Effort: Pulmonary effort is normal. No respiratory distress.     Breath sounds: Normal breath sounds. No wheezing or rales.  Abdominal:     General: Bowel sounds are normal. There is no distension.     Palpations: Abdomen is soft. There is no mass.     Tenderness: There is no abdominal tenderness.  Musculoskeletal:        General: Tenderness present. Normal range of motion.     Cervical back: Normal range of motion and neck supple.     Comments: Minimal tenderness over the left foot on the dorsal surface laterally  Lymphadenopathy:     Cervical: No cervical adenopathy.  Skin:    General: Skin is warm and dry.     Findings: Erythema and rash present.     Comments: Redness spreading over the dorsum of the foot and up the medial aspect of the left calf in a lymphangitic appearance  Neurological:     Mental Status: She is alert.     Coordination: Coordination normal.     Comments: Awake alert and following commands without difficulty  Psychiatric:        Behavior: Behavior normal.     ED Results / Procedures / Treatments   Labs (all labs ordered are listed, but only abnormal results are displayed) Labs Reviewed  COMPREHENSIVE METABOLIC PANEL - Abnormal; Notable for the following components:      Result Value   Glucose, Bld 221 (*)    Albumin 3.1 (*)    AST 12 (*)    All other components within normal limits  CBC WITH DIFFERENTIAL/PLATELET    EKG None  Radiology No results found.  Procedures Procedures (including critical care time)  Medications Ordered in ED Medications  sulfamethoxazole-trimethoprim (BACTRIM DS) 800-160 MG per tablet 1 tablet (has no administration in time range)  metoprolol tartrate (LOPRESSOR) tablet 50 mg (50 mg Oral Given 08/16/19  1032)    ED Course  I have reviewed the triage vital signs and the nursing notes.  Pertinent labs & imaging results that  were available during my care of the patient were reviewed by me and considered in my medical decision making (see chart for details).    MDM Rules/Calculators/A&P                      There does appear to be a more intense redness on the dorsum of the foot however there does appear to be some spreading lymphangitis type appearance spreading up the medial calf.  Will discuss with pharmacy as the patient has been taking cephalexin and doxycycline already.  She is not febrile, she is tachycardic to 130 but admits that she did not take her metoprolol this morning.  This will be ordered.  Labs reviewed, white blood cell count is 6700, glucose is 220, normal creatinine, no increased anion gap acidosis.  The patient's x-ray was reviewed from 2 days ago, no signs of osteomyelitis  I have discussed the care with the pharmacist, he has recommended switching from doxycycline to Bactrim to obtain different MRSA coverage.  I agree with this plan, the patient is not on an ACE inhibitor, has normal renal function and should tolerate this.  She is aware of this change, she has follow-up with the wound care center to have this wound debrided, at this time she is not in any way septic or ill-appearing and is stable for discharge.  She has been given her metoprolol dose, her low-grade tachycardia is likely related to not having that medicine this morning and less likely related to an aggressive infection.  Final Clinical Impression(s) / ED Diagnoses Final diagnoses:  Cellulitis of left foot    Rx / DC Orders ED Discharge Orders         Ordered    sulfamethoxazole-trimethoprim (BACTRIM DS) 800-160 MG tablet  2 times daily     08/16/19 1056           Noemi Chapel, MD 08/16/19 1100

## 2019-08-21 ENCOUNTER — Other Ambulatory Visit: Payer: Self-pay

## 2019-08-21 ENCOUNTER — Encounter (HOSPITAL_COMMUNITY): Payer: Self-pay

## 2019-08-21 ENCOUNTER — Emergency Department (HOSPITAL_COMMUNITY)
Admission: EM | Admit: 2019-08-21 | Discharge: 2019-08-22 | Disposition: A | Discharge status: Home / Self Care | Payer: Self-pay | Attending: Emergency Medicine | Admitting: Emergency Medicine

## 2019-08-21 ENCOUNTER — Encounter: Payer: 59 | Attending: Internal Medicine | Admitting: Internal Medicine

## 2019-08-21 DIAGNOSIS — Z87891 Personal history of nicotine dependence: Secondary | ICD-10-CM | POA: Insufficient documentation

## 2019-08-21 DIAGNOSIS — E11621 Type 2 diabetes mellitus with foot ulcer: Secondary | ICD-10-CM | POA: Diagnosis not present

## 2019-08-21 DIAGNOSIS — L03116 Cellulitis of left lower limb: Secondary | ICD-10-CM | POA: Diagnosis not present

## 2019-08-21 DIAGNOSIS — R112 Nausea with vomiting, unspecified: Secondary | ICD-10-CM | POA: Insufficient documentation

## 2019-08-21 DIAGNOSIS — R509 Fever, unspecified: Secondary | ICD-10-CM | POA: Insufficient documentation

## 2019-08-21 DIAGNOSIS — Z881 Allergy status to other antibiotic agents status: Secondary | ICD-10-CM | POA: Diagnosis not present

## 2019-08-21 DIAGNOSIS — I10 Essential (primary) hypertension: Secondary | ICD-10-CM | POA: Diagnosis not present

## 2019-08-21 DIAGNOSIS — Z880 Allergy status to penicillin: Secondary | ICD-10-CM | POA: Diagnosis not present

## 2019-08-21 DIAGNOSIS — Z20822 Contact with and (suspected) exposure to covid-19: Secondary | ICD-10-CM | POA: Insufficient documentation

## 2019-08-21 DIAGNOSIS — E1151 Type 2 diabetes mellitus with diabetic peripheral angiopathy without gangrene: Secondary | ICD-10-CM | POA: Diagnosis not present

## 2019-08-21 DIAGNOSIS — L97528 Non-pressure chronic ulcer of other part of left foot with other specified severity: Secondary | ICD-10-CM | POA: Diagnosis not present

## 2019-08-21 DIAGNOSIS — Z7984 Long term (current) use of oral hypoglycemic drugs: Secondary | ICD-10-CM | POA: Insufficient documentation

## 2019-08-21 DIAGNOSIS — E1140 Type 2 diabetes mellitus with diabetic neuropathy, unspecified: Secondary | ICD-10-CM | POA: Insufficient documentation

## 2019-08-21 DIAGNOSIS — L97529 Non-pressure chronic ulcer of other part of left foot with unspecified severity: Secondary | ICD-10-CM | POA: Insufficient documentation

## 2019-08-21 DIAGNOSIS — R1111 Vomiting without nausea: Secondary | ICD-10-CM

## 2019-08-21 LAB — CBC
HCT: 40.1 % (ref 36.0–46.0)
Hemoglobin: 13.4 g/dL (ref 12.0–15.0)
MCH: 32.7 pg (ref 26.0–34.0)
MCHC: 33.4 g/dL (ref 30.0–36.0)
MCV: 97.8 fL (ref 80.0–100.0)
Platelets: 317 10*3/uL (ref 150–400)
RBC: 4.1 MIL/uL (ref 3.87–5.11)
RDW: 12.1 % (ref 11.5–15.5)
WBC: 8.3 10*3/uL (ref 4.0–10.5)
nRBC: 0 % (ref 0.0–0.2)

## 2019-08-21 NOTE — Progress Notes (Signed)
[place], [place] ([id number]) Visit Report for 08/21/2019 Abuse/Suicide Risk Screen Details Patient Name: Julie Prince, Julie Prince Date of Service: 08/21/2019 9:45 AM Medical Record Number: [id number] Patient Account Number: [id number] Date of Birth/Sex: May 10, 1960 (59 y.o. F) Treating RN: Montey Hora Primary Care Mahina Salatino: [name] Other Clinician: Referring Ayliana Casciano: Noemi Chapel Treating Magdalene Tardiff/Extender: Tito Dine in Treatment: 0 Abuse/Suicide Risk Screen Items Answer ABUSE RISK SCREEN: Has anyone close to you tried to hurt or harm you recentlyo No Do you feel uncomfortable with anyone in your familyo No Has anyone forced you do things that you didnot want to doo No Electronic Signature(s) Signed: 08/21/2019 4:33:52 PM By: Montey Hora Entered By: Montey Hora on 08/21/2019 09:59:10 [place], Julie Prince ([id number]) -------------------------------------------------------------------------------- Activities of Daily Living Details Patient Name: Julie Prince, Julie Prince Date of Service: 08/21/2019 9:45 AM Medical Record Number: [id number] Patient Account Number: [id number] Date of Birth/Sex: 08/07/1960 (59 y.o. F) Treating RN: Montey Hora Primary Care Ashan Cueva: [name] Other Clinician: Referring Julyan Gales: Noemi Chapel Treating Keoni Havey/Extender: Tito Dine in Treatment: 0 Activities of Daily Living Items Answer Activities of Daily Living (Please select one for each item) Drive Automobile Completely Able Take Medications Completely Able Use Telephone Completely Able Care for Appearance Completely Able Use Toilet Completely Able Bath / Shower Completely Able Dress Self Completely Able Feed Self Completely Able Walk Completely Able Get In / Out Bed Completely Able Housework Completely Able Prepare Meals Completely Able Handle Money Completely Able Shop for Self Completely Able Electronic Signature(s) Signed: 08/21/2019 4:33:52 PM By: Montey Hora Entered By: Montey Hora on 08/21/2019 09:59:34 [place], Julie Prince ([id number]) -------------------------------------------------------------------------------- Education Screening Details Patient Name: Julie Prince Date of Service: 08/21/2019 9:45 AM Medical Record Number: [id number] Patient Account Number: [id number] Date of Birth/Sex: 1960/10/01 (59 y.o. F) Treating RN: Montey Hora Primary Care Deniece Rankin: [name] Other Clinician: Referring Jalayna Josten: Noemi Chapel Treating Jordie Schreur/Extender: Tito Dine in Treatment: 0 Primary Learner Assessed: Patient Learning Preferences/Education Level/Primary Language Learning Preference: Explanation, Demonstration Highest Education Level: High School Preferred Language: English Cognitive Barrier Language Barrier: No Translator Needed: No Memory Deficit: No Emotional Barrier: No Cultural/Religious Beliefs Affecting Medical Care: No Physical Barrier Impaired Vision: No Impaired Hearing: No Decreased Hand dexterity: No Knowledge/Comprehension Knowledge Level: Medium Comprehension Level: Medium Ability to understand written instructions: Medium Ability to understand verbal instructions: Medium Motivation Anxiety Level: Calm Cooperation: Cooperative Education Importance: Acknowledges Need Interest in Health Problems: Asks Questions Perception: Coherent Willingness to Engage in Self-Management Medium Activities: Readiness to Engage in Self-Management Medium Activities: Electronic Signature(s) Signed: 08/21/2019 4:33:52 PM By: Montey Hora Entered By: Montey Hora on 08/21/2019 09:59:58 [place], Julie Prince ([id number]) -------------------------------------------------------------------------------- Fall Risk Assessment Details Patient Name: Julie Prince Date of Service: 08/21/2019 9:45 AM Medical Record Number: [id number] Patient Account Number: [id number] Date of Birth/Sex: 06-Apr-1960 (59 y.o. F) Treating  RN: Montey Hora Primary Care Renner Sebald: [name] Other Clinician: Referring Jilliann Subramanian: Noemi Chapel Treating Jadore Mcguffin/Extender: Tito Dine in Treatment: 0 Fall Risk Assessment Items Have you had 2 or more falls in the last 12 monthso 0 No Have you had any fall that resulted in injury in the last 12 monthso 0 No FALLS RISK SCREEN History of falling - immediate or within 3 months 0 No Secondary diagnosis (Do you have 2 or more medical diagnoseso) 0 No Ambulatory aid None/bed rest/wheelchair/nurse 0 Yes Crutches/cane/walker 0 No Furniture 0 No Intravenous therapy Access/Saline/Heparin Lock 0 No Gait/Transferring Normal/ bed rest/ wheelchair 0 Yes Weak (short steps with or without shuffle, stooped but able to  lift head while walking, may 0 No seek support from furniture) Impaired (short steps with shuffle, may have difficulty arising from chair, head down, impaired 0 No balance) Mental Status Oriented to own ability 0 Yes Electronic Signature(s) Signed: 08/21/2019 4:33:52 PM By: Montey Hora Entered By: Montey Hora on 08/21/2019 10:00:10 Julie Prince, Julie Prince ([id number]) -------------------------------------------------------------------------------- Foot Assessment Details Patient Name: Julie Prince Date of Service: 08/21/2019 9:45 AM Medical Record Number: [id number] Patient Account Number: [id number] Date of Birth/Sex: 1960-04-11 (59 y.o. F) Treating RN: Montey Hora Primary Care Keniyah Gelinas: [name] Other Clinician: Referring Patrik Turnbaugh: Noemi Chapel Treating Miquan Tandon/Extender: Tito Dine in Treatment: 0 Foot Assessment Items Site Locations + = Sensation present, - = Sensation absent, C = Callus, U = Ulcer R = Redness, W = Warmth, M = Maceration, PU = Pre-ulcerative lesion F = Fissure, S = Swelling, D = Dryness Assessment Right: Left: Other Deformity: No No Prior Foot Ulcer: No No Prior Amputation: No No Charcot Joint: No  No Ambulatory Status: Ambulatory Without Help Gait: Steady Electronic Signature(s) Signed: 08/21/2019 4:33:52 PM By: Montey Hora Entered By: Montey Hora on 08/21/2019 10:01:33 [place], Julie Prince ([id number]) -------------------------------------------------------------------------------- Nutrition Risk Screening Details Patient Name: Julie Prince Date of Service: 08/21/2019 9:45 AM Medical Record Number: [id number] Patient Account Number: [id number] Date of Birth/Sex: January 12, 1961 (59 y.o. F) Treating RN: Montey Hora Primary Care Julie Prince: [name] Other Clinician: Referring Damarious Holtsclaw: Noemi Chapel Treating Meelah Tallo/Extender: Tito Dine in Treatment: 0 Height (in): 64 Weight (lbs): 210 Body Mass Index (BMI): 36 Nutrition Risk Screening Items Score Screening NUTRITION RISK SCREEN: I have an illness or condition that made me change the kind and/or amount of food I eat 0 No I eat fewer than two meals per day 0 No I eat few fruits and vegetables, or milk products 0 No I have three or more drinks of beer, liquor or wine almost every day 0 No I have tooth or mouth problems that make it hard for me to eat 0 No I don't always have enough money to buy the food I need 0 No I eat alone most of the time 0 No I take three or more different prescribed or over-the-counter drugs a day 1 Yes Without wanting to, I have lost or gained 10 pounds in the last six months 0 No I am not always physically able to shop, cook and/or feed myself 0 No Nutrition Protocols Good Risk Protocol 0 No interventions needed Moderate Risk Protocol High Risk Proctocol Risk Level: Good Risk Score: 1 Electronic Signature(s) Signed: 08/21/2019 4:33:52 PM By: Montey Hora Entered By: Montey Hora on 08/21/2019 10:00:16

## 2019-08-21 NOTE — ED Triage Notes (Signed)
Pt arrives POV for eval of emesis, nausea, malaise and weakness. Pt reports fever/chills, HA, loss of appetite.

## 2019-08-22 LAB — COMPREHENSIVE METABOLIC PANEL
ALT: 11 U/L (ref 0–44)
AST: 14 U/L — ABNORMAL LOW (ref 15–41)
Albumin: 3.2 g/dL — ABNORMAL LOW (ref 3.5–5.0)
Alkaline Phosphatase: 80 U/L (ref 38–126)
Anion gap: 15 (ref 5–15)
BUN: 11 mg/dL (ref 6–20)
CO2: 25 mmol/L (ref 22–32)
Calcium: 9.1 mg/dL (ref 8.9–10.3)
Chloride: 97 mmol/L — ABNORMAL LOW (ref 98–111)
Creatinine, Ser: 1 mg/dL (ref 0.44–1.00)
GFR calc Af Amer: >60 mL/min (ref >60)
GFR calc non Af Amer: >60 mL/min (ref >60)
Glucose, Bld: 213 mg/dL — ABNORMAL HIGH (ref 70–99)
Potassium: 5.1 mmol/L (ref 3.5–5.1)
Sodium: 137 mmol/L (ref 135–145)
Total Bilirubin: 0.5 mg/dL (ref 0.3–1.2)
Total Protein: 6.6 g/dL (ref 6.5–8.1)

## 2019-08-22 LAB — URINALYSIS, ROUTINE W REFLEX MICROSCOPIC
Bilirubin Urine: NEGATIVE
Glucose, UA: >=500 mg/dL — AB
Hgb urine dipstick: NEGATIVE
Ketones, ur: NEGATIVE mg/dL
Leukocytes,Ua: NEGATIVE
Nitrite: NEGATIVE
Protein, ur: NEGATIVE mg/dL
Specific Gravity, Urine: 1.014 (ref 1.005–1.030)
pH: 6 (ref 5.0–8.0)

## 2019-08-22 LAB — LIPASE, BLOOD: Lipase: 41 U/L (ref 11–51)

## 2019-08-22 LAB — SARS CORONAVIRUS 2 BY RT PCR (HOSPITAL ORDER, PERFORMED IN ~~LOC~~ HOSPITAL LAB): SARS Coronavirus 2: NEGATIVE

## 2019-08-22 MED ORDER — ONDANSETRON HCL 4 MG PO TABS: 4.0000 mg | ORAL_TABLET | Freq: Four times a day (QID) | ORAL | 0 refills | Status: DC

## 2019-08-22 MED ORDER — ONDANSETRON HCL 4 MG/2ML IJ SOLN
4.0000 mg | Freq: Once | INTRAMUSCULAR | Status: AC
  Administered 2019-08-22: 4 mg via INTRAVENOUS
  Filled 2019-08-22: qty 2

## 2019-08-22 MED ORDER — SODIUM CHLORIDE 0.9 % IV BOLUS (SEPSIS)
1000.0000 mL | Freq: Once | INTRAVENOUS | Status: AC
  Administered 2019-08-22: 1000 mL via INTRAVENOUS

## 2019-08-22 NOTE — ED Notes (Signed)
Pt given drink and crackers for PO challenge  

## 2019-08-22 NOTE — Progress Notes (Signed)
[place], [place] ([id number]) Visit Report for 08/21/2019 Allergy List Details Patient Name: LEIRA, CARLSTEDT Date of Service: 08/21/2019 9:45 AM Medical Record Number: [id number] Patient Account Number: [id number] Date of Birth/Sex: 04-Oct-1960 (59 y.o. F) Treating RN: Montey Hora Primary Care Valerya Maxton: [name] Other Clinician: Referring Donnella Morford: Noemi Chapel Treating Tameisha Covell/Extender: Ricard Dillon Weeks in Treatment: 0 Allergies Active Allergies penicillin Reaction: hives and shortness of breath metformin Reaction: diarrhea Allergy Notes Electronic Signature(s) Signed: 08/21/2019 4:33:52 PM By: Montey Hora Entered By: Montey Hora on 08/21/2019 09:59:00 [place], [place] ([id number]) -------------------------------------------------------------------------------- Arrival Information Details Patient Name: [name] Date of Service: 08/21/2019 9:45 AM Medical Record Number: [id number] Patient Account Number: [id number] Date of Birth/Sex: 12-29-1960 (58 y.o. F) Treating RN: Montey Hora Primary Care Thedora Rings: [name] Other Clinician: Referring Lux Meaders: Noemi Chapel Treating Katisha Shimizu/Extender: Tito Dine in Treatment: 0 Visit Information Patient Arrived: Ambulatory Arrival Time: 09:53 Accompanied By: daughter Transfer Assistance: None Patient Identification Verified: Yes Secondary Verification Process Completed: Yes Patient Has Alerts: Yes Patient Alerts: DMII Electronic Signature(s) Signed: 08/21/2019 4:33:52 PM By: Montey Hora Entered By: Montey Hora on 08/21/2019 09:54:06 [place], [place] ([id number]) -------------------------------------------------------------------------------- Clinic Level of Care Assessment Details Patient Name: [name] Date of Service: 08/21/2019 9:45 AM Medical Record Number: [id number] Patient Account Number: [id number] Date of Birth/Sex: 11-21-1960 (59 y.o. F) Treating RN: Cornell Barman Primary Care  Brittnay Pigman: [name] Other Clinician: Referring Halina Asano: Noemi Chapel Treating Kenijah Benningfield/Extender: Tito Dine in Treatment: 0 Clinic Level of Care Assessment Items TOOL 1 Quantity Score []  - Use when EandM and Procedure is performed on INITIAL visit 0 ASSESSMENTS - Nursing Assessment / Reassessment X - General Physical Exam (combine w/ comprehensive assessment (listed just below) when performed on new 1 20 pt. evals) X- 1 25 Comprehensive Assessment (HX, ROS, Risk Assessments, Wounds Hx, etc.) ASSESSMENTS - Wound and Skin Assessment / Reassessment []  - Dermatologic / Skin Assessment (not related to wound area) 0 ASSESSMENTS - Ostomy and/or Continence Assessment and Care []  - Incontinence Assessment and Management 0 []  - 0 Ostomy Care Assessment and Management (repouching, etc.) PROCESS - Coordination of Care X - Simple Patient / Family Education for ongoing care 1 15 []  - 0 Complex (extensive) Patient / Family Education for ongoing care []  - 0 Staff obtains Programmer, systems, Records, Test Results / Process Orders []  - 0 Staff telephones HHA, Nursing Homes / Clarify orders / etc []  - 0 Routine Transfer to another Facility (non-emergent condition) []  - 0 Routine Hospital Admission (non-emergent condition) X- 1 15 New Admissions / Biomedical engineer / Ordering NPWT, Apligraf, etc. []  - 0 Emergency Hospital Admission (emergent condition) PROCESS - Special Needs []  - Pediatric / Minor Patient Management 0 []  - 0 Isolation Patient Management []  - 0 Hearing / Language / Visual special needs []  - 0 Assessment of Community assistance (transportation, D/C planning, etc.) []  - 0 Additional assistance / Altered mentation []  - 0 Support Surface(s) Assessment (bed, cushion, seat, etc.) INTERVENTIONS - Miscellaneous []  - External ear exam 0 []  - 0 Patient Transfer (multiple staff / Civil Service fast streamer / Similar devices) []  - 0 Simple Staple / Suture removal (25 or  less) []  - 0 Complex Staple / Suture removal (26 or more) []  - 0 Hypo/Hyperglycemic Management (do not check if billed separately) X- 1 15 Ankle / Brachial Index (ABI) - do not check if billed separately Has the patient been seen at the hospital within the last three years: Yes Total Score: 90 Level Of Care: New/Established -  Level 3 [place], [place] ([id number]) Electronic Signature(s) Signed: 08/22/2019 1:40:27 PM By: Gretta Cool, BSN, RN, CWS, [name] RN, BSN Entered By: Gretta Cool, BSN, RN, CWS, [name] on 08/21/2019 10:45:46 [place], [place] ([id number]) -------------------------------------------------------------------------------- Encounter Discharge Information Details Patient Name: [name] Date of Service: 08/21/2019 9:45 AM Medical Record Number: [id number] Patient Account Number: [id number] Date of Birth/Sex: [birth year] (58 y.o. F) Treating RN: Cornell Barman Primary Care Marcell Chavarin: [name] Other Clinician: Referring Kayron Kalmar: Noemi Chapel Treating Ilona Colley/Extender: Tito Dine in Treatment: 0 Encounter Discharge Information Items Post Procedure Vitals Discharge Condition: Stable Temperature (F): 98.3 Ambulatory Status: Ambulatory Pulse (bpm): 118 Discharge Destination: Home Respiratory Rate (breaths/min): 16 Transportation: Private Auto Blood Pressure (mmHg): 181/85 Accompanied By: daughter Schedule Follow-up Appointment: Yes Clinical Summary of Care: Electronic Signature(s) Signed: 08/22/2019 1:40:27 PM By: Gretta Cool, BSN, RN, CWS, [name] RN, BSN Entered By: Gretta Cool, BSN, RN, CWS, [name] on 08/21/2019 10:47:33 [place], [place] ([id number]) -------------------------------------------------------------------------------- Lower Extremity Assessment Details Patient Name: [name] Date of Service: 08/21/2019 9:45 AM Medical Record Number: [id number] Patient Account Number: [id number] Date of Birth/Sex: 15-Jun-1960 (58 y.o. F) Treating RN: Montey Hora Primary Care Harlis Champoux:  [name] Other Clinician: Referring Amadeus Oyama: Noemi Chapel Treating Furious Chiarelli/Extender: Tito Dine in Treatment: 0 Edema Assessment Assessed: [Left: No] [Right: No] Edema: [Left: Yes] [Right: No] Calf Left: Right: Point of Measurement: 32 cm From Medial Instep 34.5 cm 35 cm Ankle Left: Right: Point of Measurement: 10 cm From Medial Instep 23 cm 21 cm Vascular Assessment Pulses: Dorsalis Pedis Palpable: [Left:Yes] [Right:Yes] Doppler Audible: [Left:Yes] [Right:Yes] Posterior Tibial Palpable: [Left:Yes] [Right:Yes] Doppler Audible: [Left:Yes] [Right:Yes] Blood Pressure: Brachial: [Left:158] [Right:160] Dorsalis Pedis: 110 [Left:Dorsalis Pedis: [id number] Ankle: Posterior Tibial: 138 [Left:Posterior Tibial: 90 0.86] [Right:0.74] Electronic Signature(s) Signed: 08/21/2019 4:33:52 PM By: Montey Hora Entered By: Montey Hora on 08/21/2019 10:17:23 [place], [place] ([id number]) -------------------------------------------------------------------------------- Multi Wound Chart Details Patient Name: [name] Date of Service: 08/21/2019 9:45 AM Medical Record Number: [id number] Patient Account Number: [id number] Date of Birth/Sex: 1961-02-20 (59 y.o. F) Treating RN: Cornell Barman Primary Care Brennon Otterness: [name] Other Clinician: Referring Attila Mccarthy: Noemi Chapel Treating Demarques Pilz/Extender: Tito Dine in Treatment: 0 Vital Signs Height(in): 64 Pulse(bpm): 118 Weight(lbs): 210 Blood Pressure(mmHg): 181/85 Body Mass Index(BMI): 36 Temperature(F): 98.3 Respiratory Rate(breaths/min): 16 Photos: [N/A:N/A] Wound Location: Left Toe Fifth N/A N/A Wounding Event: Blister N/A N/A Primary Etiology: Diabetic Wound/Ulcer of the Lower N/A N/A Extremity Comorbid History: Hypertension, Type II Diabetes N/A N/A Date Acquired: 08/07/2019 N/A N/A Weeks of Treatment: 0 N/A N/A Wound Status: Open N/A N/A Measurements L x W x D (cm) 0.6x1x0.2 N/A N/A Area  (cm) : 0.471 N/A N/A Volume (cm) : 0.094 N/A N/A Classification: Grade 1 N/A N/A Exudate Amount: Medium N/A N/A Exudate Type: Serous N/A N/A Exudate Color: amber N/A N/A Wound Margin: Flat and Intact N/A N/A Granulation Amount: Medium (34-66%) N/A N/A Granulation Quality: Pink N/A N/A Necrotic Amount: Medium (34-66%) N/A N/A Exposed Structures: Fat Layer (Subcutaneous Tissue) N/A N/A Exposed: Yes Fascia: No Tendon: No Muscle: No Joint: No Bone: No Epithelialization: None N/A N/A Debridement: Debridement - Excisional N/A N/A Pre-procedure Verification/Time 10:32 N/A N/A Out Taken: Pain Control: Lidocaine N/A N/A Tissue Debrided: Callus, Subcutaneous N/A N/A Level: Skin/Subcutaneous Tissue N/A N/A Debridement Area (sq cm): 0.6 N/A N/A Instrument: Curette, Forceps, Scissors N/A N/A Specimen: Swab N/A N/A Number of Specimens Taken: 1 N/A N/A Bleeding: Minimum N/A N/A Hemostasis Achieved: Pressure N/A N/A Debridement Treatment Procedure was tolerated well N/A N/A Response: 1.5x1x0.4 N/A  N/A [place], [place] ([id number]) Post Debridement Measurements L x W x D (cm) Post Debridement Volume: 0.471 N/A N/A (cm) Procedures Performed: Debridement N/A N/A Treatment Notes Wound #1 (Left Toe Fifth) Notes SIlvercell, gauze and conform Electronic Signature(s) Signed: 08/21/2019 4:41:32 PM By: Linton Ham MD Entered By: Linton Ham on 08/21/2019 11:03:25 [place], [place] ([id number]) -------------------------------------------------------------------------------- Multi-Disciplinary Care Plan Details Patient Name: [name] Date of Service: 08/21/2019 9:45 AM Medical Record Number: [id number] Patient Account Number: [id number] Date of Birth/Sex: 1960-11-20 (59 y.o. F) Treating RN: Cornell Barman Primary Care Gabrella Stroh: [name] Other Clinician: Referring Amaia Lavallie: Noemi Chapel Treating Elizet Kaplan/Extender: Tito Dine in Treatment: 0 Active Inactive Necrotic  Tissue Nursing Diagnoses: Impaired tissue integrity related to necrotic/devitalized tissue Goals: Necrotic/devitalized tissue will be minimized in the wound bed Date Initiated: 08/21/2019 Target Resolution Date: 08/28/2019 Goal Status: Active Interventions: Assess patient pain level pre-, during and post procedure and prior to discharge Treatment Activities: Apply topical anesthetic as ordered : 08/21/2019 Notes: Orientation to the Wound Care Program Nursing Diagnoses: Knowledge deficit related to the wound healing center program Goals: Patient/caregiver will verbalize understanding of the [place] Date Initiated: 08/21/2019 Target Resolution Date: 08/29/2019 Goal Status: Active Interventions: Provide education on orientation to the wound center Notes: Soft Tissue Infection Nursing Diagnoses: Impaired tissue integrity Goals: Patient will remain free of wound infection Date Initiated: 08/21/2019 Target Resolution Date: 08/28/2019 Goal Status: Active Interventions: Assess signs and symptoms of infection every visit Treatment Activities: Systemic antibiotics : 08/21/2019 Notes: Tissue Oxygenation Nursing Diagnoses: Potential alteration in peripheral tissue perfusion (select prior to confirmation of diagnosis) [place], [name] ([id number]) Goals: Patient/caregiver will verbalize understanding of disease process and disease management Date Initiated: 08/21/2019 Target Resolution Date: 08/28/2019 Goal Status: Active Interventions: Assess patient understanding of disease process and management upon diagnosis and as needed Assess peripheral arterial status upon admission and as needed Provide education on tissue oxygenation and ischemia Notes: Wound/Skin Impairment Nursing Diagnoses: Impaired tissue integrity Goals: Ulcer/skin breakdown will have a volume reduction of 30% by week 4 Date Initiated: 08/21/2019 Target Resolution Date: 08/28/2019 Goal Status:  Active Interventions: Provide education on ulcer and skin care Notes: Electronic Signature(s) Signed: 08/22/2019 1:40:27 PM By: Gretta Cool, BSN, RN, CWS, [name] RN, BSN Entered By: Gretta Cool, BSN, RN, CWS, [name] on 08/21/2019 10:31:23 [place], [name] ([id number]) -------------------------------------------------------------------------------- Pain Assessment Details Patient Name: [name] Date of Service: 08/21/2019 9:45 AM Medical Record Number: [id number] Patient Account Number: [id number] Date of Birth/Sex: February 06, 1961 (58 y.o. F) Treating RN: Montey Hora Primary Care Moo Gravley: [name] Other Clinician: Referring Tamberly Pomplun: Noemi Chapel Treating Akeira Lahm/Extender: Tito Dine in Treatment: 0 Active Problems Location of Pain Severity and Description of Pain Patient Has Paino No Site Locations Pain Management and Medication Current Pain Management: Notes reports feeling pressure Electronic Signature(s) Signed: 08/21/2019 4:33:52 PM By: Montey Hora Entered By: Montey Hora on 08/21/2019 09:54:46 [place], [place] ([id number]) -------------------------------------------------------------------------------- Patient/Caregiver Education Details Patient Name: [name] Date of Service: 08/21/2019 9:45 AM Medical Record Number: [id number] Patient Account Number: [id number] Date of Birth/Gender: 06-Jun-1960 (58 y.o. F) Treating RN: Cornell Barman Primary Care Physician: [name] Other Clinician: Referring Physician: Noemi Chapel Treating Physician/Extender: Tito Dine in Treatment: 0 Education Assessment Education Provided To: Patient Education Topics Provided Tissue Oxygenation: Handouts: Peripheral Arterial Disease and Related Ulcers Methods: Demonstration, Explain/Verbal Responses: State content correctly Welcome To [facility] [place]: Handouts: Welcome To [facility] [place] Methods: Demonstration, Explain/Verbal Responses: State content  correctly Wound/Skin Impairment: Handouts: Caring for  Your Ulcer Methods: Demonstration, Explain/Verbal Responses: State content correctly Electronic Signature(s) Signed: 08/22/2019 1:40:27 PM By: Gretta Cool, BSN, RN, CWS, [name] RN, BSN Entered By: Gretta Cool, BSN, RN, CWS, [name] on 08/21/2019 10:46:13 [place], [place] ([id number]) -------------------------------------------------------------------------------- Wound Assessment Details Patient Name: [name] Date of Service: 08/21/2019 9:45 AM Medical Record Number: [id number] Patient Account Number: [id number] Date of Birth/Sex: [birth year] (58 y.o. F) Treating RN: Montey Hora Primary Care Avaline Stillson: [name] Other Clinician: Referring Zvi Duplantis: Noemi Chapel Treating Jaquell Seddon/Extender: Tito Dine in Treatment: 0 Wound Status Wound Number: 1 Primary Etiology: Diabetic Wound/Ulcer of the Lower Extremity Wound Location: Left Toe Fifth Wound Status: Open Wounding Event: Blister Comorbid History: Hypertension, Type II Diabetes Date Acquired: 08/07/2019 Weeks Of Treatment: 0 Clustered Wound: No Photos Wound Measurements Length: (cm) 0.6 % R Width: (cm) 1 % R Depth: (cm) 0.2 Epi Area: (cm) 0.471 Tu Volume: (cm) 0.094 Un eduction in Area: eduction in Volume: thelialization: None nneling: No dermining: No Wound Description Classification: Grade 1 Fo Wound Margin: Flat and Intact Sl Exudate Amount: Medium Exudate Type: Serous Exudate Color: amber ul Odor After Cleansing: No ough/Fibrino Yes Wound Bed Granulation Amount: Medium (34-66%) Exposed Structure Granulation Quality: Pink Fascia Exposed: No Necrotic Amount: Medium (34-66%) Fat Layer (Subcutaneous Tissue) Exposed: Yes Necrotic Quality: Adherent Slough Tendon Exposed: No Muscle Exposed: No Joint Exposed: No Bone Exposed: No Treatment Notes Wound #1 (Left Toe Fifth) Notes SIlvercell, gauze and conform Electronic Signature(s) Signed: 08/21/2019 4:33:52  PM By: Jarvis Newcom[place], [name] ([id number]) Entered By: Montey Hora on 08/21/2019 10:09:21 [place], [name] ([id number]) -------------------------------------------------------------------------------- Vitals Details Patient Name: [name] Date of Service: 08/21/2019 9:45 AM Medical Record Number: [id number] Patient Account Number: [id number] Date of Birth/Sex: 10/06/1960 (59 y.o. F) Treating RN: Montey Hora Primary Care Bryker Fletchall: [name] Other Clinician: Referring Ilo Beamon: Noemi Chapel Treating Kona Lover/Extender: Tito Dine in Treatment: 0 Vital Signs Time Taken: 09:54 Temperature (F): 98.3 Height (in): 64 Pulse (bpm): 118 Source: Stated Respiratory Rate (breaths/min): 16 Weight (lbs): 210 Blood Pressure (mmHg): 181/85 Source: Measured Reference Range: 80 - 120 mg / dl Body Mass Index (BMI): 36 Electronic Signature(s) Signed: 08/21/2019 4:33:52 PM By: Montey Hora Entered By: Montey Hora on 08/21/2019 [id number]

## 2019-08-22 NOTE — ED Provider Notes (Signed)
[facility] EMERGENCY DEPARTMENT Provider Note   CSN: [id number] Arrival date & time: 08/21/19  2217     History Chief Complaint  Patient presents with  . Emesis    Julie Prince is a 59 y.o. female.  Patient is a 59 year old female who has a past medical history of type 2 diabetes, peripheral arterial disease presenting to the emergency department for fever, nausea vomiting.  She reports that the symptoms began last night.  Over the last for 5 days she has been here and at the wound care center for treatment of a lateral left little toe shallow ulcer.  She was switched to Bactrim from doxycycline just a couple of days ago.  She reports that she had a temperature of 99.8 last night and began to have lots of vomiting and unable to keep anything down.  Also reports some stomach cramping.  Denies any diarrhea, shortness of breath, new cough.  She did seeing wound care center less than 24 hours ago and had wound dressing placed.  She reports that the redness in her foot has actually decreased and her wound is appearing better.        Past Medical History:  Diagnosis Date  . Allergy   . Barrett esophagus   . Cataract    left cataract removal  . Essential hypertension, benign 09/12/2013  . GERD (gastroesophageal reflux disease)   . Hyperlipidemia 09/12/2013  . 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 Type 2 diabetes mellitus ([place]) 09/12/2013   Dr. Posey Pronto at [place]     Patient Active Problem List   Diagnosis Date Noted  . Stress due to illness of family member 09/05/2018  . PAD (peripheral artery disease) [facility])-  See's Cards- Dr Elaina Pattee [place]/ that group for this condition 05/28/2018  . Foot pain, left 04/20/2018  . Trigger ring finger 04/20/2018  . Diabetic retinopathy associated with diabetes mellitus due to underlying condition ([place]) 04/20/2018  . 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 Diabetes mellitus due to underlying condition with  stage 3 chronic kidney disease, without long-term current use of insulin ([place]) 07/12/2017  . Mixed diabetic hyperlipidemia associated with type 2 diabetes mellitus ([place]) 07/12/2017  . Hypertension associated with diabetes ([place]) 07/12/2017  . Obesity, Class III, morbid obesity  ([place]) 07/12/2017  . H/O medication noncompliance 07/12/2017  . Barrett's esophagus 03/11/2016  . 1st degree AV block 03/01/2016  . Tachycardia with hypertension 03/01/2016  . Dysphagia 02/10/2016  . Vulvar abscess   . Sepsis ([place]) 06/21/2015  . GERD (gastroesophageal reflux disease) 07/31/2014  . Adenomatous polyp of colon 04/03/2014  . Allergic rhinitis 01/23/2014  . CTS (carpal tunnel syndrome) 10/03/2013  . Uncontrolled type 2 diabetes mellitus with hyperglycemia ([place]) 10/03/2013  . Low back pain 10/03/2013  . Lumbar arthropathy 10/03/2013  . Lumbar degenerative disc disease 10/03/2013  . Neuropathy 10/03/2013  . Osteoarthritis 10/03/2013  . 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 Tenosynovitis of foot 10/03/2013  . Currently controlled diabetes mellitus type 2 with complications ([place]) 99991111  . Hyperlipidemia 09/12/2013  . Essential hypertension, benign 09/12/2013  . Diabetic peripheral neuropathy associated with type 2 diabetes mellitus ([place]) 09/12/2013    Past Surgical History:  Procedure Laterality Date  . ABDOMINAL AORTOGRAM W/LOWER EXTREMITY N/A 06/06/2018   Procedure: ABDOMINAL AORTOGRAM W/LOWER EXTREMITY;  Surgeon: Wellington Hampshire, MD;  Location: [facility];  Service: Cardiovascular;  Laterality: N/A;  . APPENDECTOMY    . CATARACT EXTRACTION Left   . CESAREAN SECTION  1990  .  COLONOSCOPY    . right tube and ovary removed    . WISDOM TOOTH EXTRACTION       OB History    Gravida  4   Para  3   Term  2   Preterm  1   AB  1   Living  3     SAB  1   TAB  0   Ectopic  0   Multiple  0   Live Births  3           Family History  Problem Relation Age of Onset  .  Lung cancer Mother   . Stroke Father   . Heart disease Maternal Aunt   . Fibromyalgia Sister   . Alzheimer's disease Maternal Grandmother   . Colon cancer Neg Hx   . Esophageal cancer Neg Hx   . Rectal cancer Neg Hx   . Stomach cancer Neg Hx   . Pancreatic cancer Neg Hx     Social History   Tobacco Use  . Smoking status: Former Smoker    Packs/day: 0.50  . Smokeless tobacco: Never Used  . Tobacco comment: quit 30 plus years ago.  Substance Use Topics  . Alcohol use: No    Alcohol/week: 0.0 standard drinks  . Drug use: No    Home Medications Prior to Admission medications   Medication Sig Start Date End Date Taking? Authorizing Provider  aspirin 81 MG chewable tablet Chew by mouth daily.   Yes [provider]  cilostazol (PLETAL) 50 MG tablet TAKE 1 TABLET BY MOUTH TWICE A DAY Patient taking differently: Take 50 mg by mouth 2 (two) times daily.  12/26/18  Yes Wellington Hampshire, MD  Continuous Blood Gluc Sensor (FREESTYLE LIBRE 14 DAY SENSOR) MISC 1 each by Other route See admin instructions. Test daily as directed and change every 14 days. 06/06/18  Yes [provider]  metoprolol tartrate (LOPRESSOR) 50 MG tablet Take 1.5 tablets (75 mg total) by mouth 2 (two) times daily. Patient taking differently: Take 50 mg by mouth 2 (two) times daily.  12/18/18  Yes Wellington Hampshire, MD  Semaglutide, 1 MG/DOSE, (OZEMPIC, 1 MG/DOSE,) 2 MG/1.5ML SOPN Inject 1 mg into the skin once a week. 03/24/19  Yes Larene Pickett, PA-C  sulfamethoxazole-trimethoprim (BACTRIM DS) 800-160 MG tablet Take 1 tablet by mouth 2 (two) times daily for 7 days. 08/16/19 08/23/19 Yes Noemi Chapel, MD  TRESIBA FLEXTOUCH 100 UNIT/ML SOPN FlexTouch Pen INJECT 50 UNITS SUBCUTANEOUS AT BEDTIME Patient taking differently: Inject 50 Units into the skin at bedtime.  12/29/18  Yes Opalski, Deborah, DO  atorvastatin (LIPITOR) 40 MG tablet Take 1 tablet (40 mg total) by mouth at bedtime. Patient not taking:  Reported on 08/14/2019 09/05/18   Mellody Dance, DO  losartan (COZAAR) 50 MG tablet Take 1 tablet (50 mg total) by mouth daily. Patient not taking: Reported on 08/14/2019 12/26/18 03/26/19  Mellody Dance, DO  ondansetron (ZOFRAN) 4 MG tablet Take 1 tablet (4 mg total) by mouth every 6 (six) hours. 08/22/19   Alveria Apley, PA-C  Vitamin D, Ergocalciferol, (DRISDOL) 1.25 MG (50000 UT) CAPS capsule Take one tablet wkly Patient not taking: Reported on 08/14/2019 12/29/18   Mellody Dance, DO    Allergies    Penicillins and Metformin and related  Review of Systems   Review of Systems  Constitutional: Positive for appetite change and fever (subjective).  HENT: Negative for sore throat.   Respiratory: Negative for shortness  of breath.   Cardiovascular: Negative for chest pain.  Gastrointestinal: Positive for abdominal pain, nausea and vomiting. Negative for diarrhea.  Genitourinary: Negative for dysuria.  Musculoskeletal: Negative for back pain.  Skin: Positive for wound.  Neurological: Negative for headaches.  All other systems reviewed and are negative.   Physical Exam Updated Vital Signs BP (!) 174/80   Pulse 86   Temp 98.6 F (37 C) (Oral)   Resp 18   Ht 5\' 4"  (1.626 m)   Wt 98 kg   SpO2 98%   BMI 37.09 kg/m   Physical Exam Vitals and nursing note reviewed.  Constitutional:      General: She is not in acute distress.    Appearance: Normal appearance. She is not ill-appearing, toxic-appearing or diaphoretic.  HENT:     Head: Normocephalic.     Mouth/Throat:     Mouth: Mucous membranes are moist.  Eyes:     Conjunctiva/sclera: Conjunctivae normal.  Cardiovascular:     Rate and Rhythm: Normal rate and regular rhythm.  Pulmonary:     Effort: Pulmonary effort is normal.  Musculoskeletal:       Feet:  Feet:     Comments: 1.5 cm shallow ulceration to the lateral left little toe. There is no surrounding erythema, no drainage.  Skin:    General: Skin is dry.    Neurological:     Mental Status: She is alert.  Psychiatric:        Mood and Affect: Mood normal.     ED Results / Procedures / Treatments   Labs (all labs ordered are listed, but only abnormal results are displayed) Labs Reviewed  COMPREHENSIVE METABOLIC PANEL - Abnormal; Notable for the following components:      Result Value   Chloride 97 (*)    Glucose, Bld 213 (*)    Albumin 3.2 (*)    AST 14 (*)    All other components within normal limits  URINALYSIS, ROUTINE W REFLEX MICROSCOPIC - Abnormal; Notable for the following components:   APPearance HAZY (*)    Glucose, UA >=500 (*)    Bacteria, UA RARE (*)    All other components within normal limits  SARS CORONAVIRUS 2 BY RT PCR (HOSPITAL ORDER, [facility])  URINE CULTURE  LIPASE, BLOOD  CBC  I-STAT BETA HCG BLOOD, ED ([facility], [facility], [facility] ONLY)    EKG None  Radiology No results found.  Procedures Procedures (including critical care time)  Medications Ordered in ED Medications  sodium chloride 0.9 % bolus 1,000 mL (0 mLs Intravenous Stopped 08/22/19 0957)  ondansetron (ZOFRAN) injection 4 mg (4 mg Intravenous Given 08/22/19 0840)    ED Course  I have reviewed the triage vital signs and the nursing notes.  Pertinent labs & imaging results that were available during my care of the patient were reviewed by me and considered in my medical decision making (see chart for details).  Clinical Course as of Aug 21 1328  Thu Aug 22, 2019  0802 Patient presenting with nausea, vomiting, abdominal cramping after being started on Bactrim cellulitis of the foot.  Her foot wound today is improved and does appear to be healing well on my exam without purulent drainage or significant surrounding erythema.  White blood cell count is normal.  She was afebrile here and the highest recorded temperature at home was 99.8.  She appears well-hydrated.  I believe that her symptoms are more or less from the new antibiotic  triage and not from worsening cellulitis or other infectious process.  However, I will check a urine.  She has not been able to give a urine sample so a liter of fluid was ordered for some possible mild dehydration.  Also IV Zofran ordered.   [KM]  1029 Patient stable. Awaiting urinalysis, PO trial   [KM]  1327 Patient resting comfortably. Toleration PO, well appearing. Covid test pending. Wound was re-dress. I suspect her symptoms are from taking the antibiotic. She was advised to take probiotics while taking antibiotic. She will have follow-up with wound care. Advised on return precautions.   [KM]    Clinical Course User Index [KM] Kristine Heggs   MDM Rules/Calculators/A&P                      Thank you for allowing me to care for you today. Please return to the emergency department if you have new or worsening symptoms. Take your medications as instructed.   Final Clinical Impression(s) / ED Diagnoses Final diagnoses:  Non-intractable vomiting without nausea, unspecified vomiting type    Rx / DC Orders ED Discharge Orders         Ordered    ondansetron (ZOFRAN) 4 MG tablet  Every 6 hours     08/22/19 1329           Kristine Covell 08/22/19 1330    Veryl Speak, MD 08/23/19 0710

## 2019-08-22 NOTE — Discharge Instructions (Signed)
You are seen today for nausea and vomiting and feeling unwell. Your labs were reassuring. Your wound looks like it is healing well. We changed the dressing on your wound and gave you IV fluids and IV nausea medication. Please continue to take the Bactrim. We have prescribed you nausea medication that he can continue taking. Also, you may obtain probiotics over-the-counter to take while you are using the antibiotic. This can prevent any abdominal upset, diarrhea, nausea or vomiting. Your Covid test is also pending. Please make sure you stay isolated until your results returned which should be sometime later today. Thank you for allowing me to care for you today. Please return to the emergency department if you have new or worsening symptoms. Take your medications as instructed.

## 2019-08-22 NOTE — ED Notes (Signed)
Urine culture obtained with urine sample. Specimen sent to lab.

## 2019-08-22 NOTE — ED Notes (Signed)
Ginger ale given to pt for fluid challenge.

## 2019-08-22 NOTE — Progress Notes (Signed)
[employer], [place] ([id number]) Visit Report for 08/21/2019 Chief Complaint Document Details Patient Name: Julie Prince, Julie Prince Date of Service: 08/21/2019 9:45 AM Medical Record Number: [id number] Patient Account Number: [id number] Date of Birth/Sex: Aug 01, 1960 (59 y.o. F) Treating RN: Cornell Barman Primary Care Provider: [name] Other Clinician: Referring Provider: Noemi Chapel Treating Provider/Extender: Tito Dine in Treatment: 0 Information Obtained from: Patient Chief Complaint 08/21/2019; patient is here for review of a wound on the dorsal aspect of the PIP of her left fifth toe Electronic Signature(s) Signed: 08/21/2019 4:41:32 PM By: Linton Ham MD Entered By: Linton Ham on 08/21/2019 11:04:05 [place], Julie Prince ([id number]) -------------------------------------------------------------------------------- Debridement Details Patient Name: Julie Prince Date of Service: 08/21/2019 9:45 AM Medical Record Number: [id number] Patient Account Number: [id number] Date of Birth/Sex: 1960/05/06 (59 y.o. F) Treating RN: Cornell Barman Primary Care Provider: [name] Other Clinician: Referring Provider: Noemi Chapel Treating Provider/Extender: Tito Dine in Treatment: 0 Debridement Performed for Wound #1 Left Toe Fifth Assessment: Performed By: Physician Ricard Dillon, MD Debridement Type: Debridement Severity of Tissue Pre Debridement: Fat layer exposed Level of Consciousness (Pre- Awake and Alert procedure): Pre-procedure Verification/Time Out Yes - 10:32 Taken: Start Time: 10:32 Pain Control: Lidocaine Total Area Debrided (L x W): 0.6 (cm) x 1 (cm) = 0.6 (cm) Tissue and other material Callus, Subcutaneous, Skin: Dermis debrided: Level: Skin/Subcutaneous Tissue Debridement Description: Excisional Instrument: Curette, Forceps, Scissors Specimen: Swab, Number of Specimens Taken: 1 Bleeding: Minimum Hemostasis Achieved: Pressure Response to  Treatment: Procedure was tolerated well Level of Consciousness (Post- Awake and Alert procedure): Post Debridement Measurements of Total Wound Length: (cm) 1.5 Width: (cm) 1 Depth: (cm) 0.4 Volume: (cm) 0.471 Character of Wound/Ulcer Post Debridement: Stable Severity of Tissue Post Debridement: Fat layer exposed Post Procedure Diagnosis Same as Pre-procedure Electronic Signature(s) Signed: 08/21/2019 4:41:32 PM By: Linton Ham MD Signed: 08/22/2019 1:40:27 PM By: Gretta Cool, BSN, RN, CWS, [name] RN, BSN Entered By: Linton Ham on 08/21/2019 11:03:40 [place], Julie Prince ([id number]) -------------------------------------------------------------------------------- HPI Details Patient Name: Julie Prince Date of Service: 08/21/2019 9:45 AM Medical Record Number: [id number] Patient Account Number: [id number] Date of Birth/Sex: [birth year] (59 y.o. F) Treating RN: Cornell Barman Primary Care Provider: [name] Other Clinician: Referring Provider: Noemi Chapel Treating Provider/Extender: Ricard Dillon Weeks in Treatment: 0 History of Present Illness HPI Description: Resulted: 06/06/18 1152 Order Status: Completed Updated: 06/06/18 1201 Narrative: o 1. oNo significant aortoiliac disease. 2. oLeft lower extremity: Flush occlusion of the SFA with reconstitution via extensive collaterals from the profunda distally. oTwo-vessel runoff below the knee via the anterior and posterior tibial arteries. 3. oRight lower extremity: Diffuse SFA disease proximally followed by a total occlusion in the midsegment with reconstitution distally via extensive collaterals from the profunda. oOne-vessel runoff below the knee via the posterior tibial artery. Recommendations: Recommend maximizing medical therapy as the patient has good collaterals. o I am going to start her on small dose cilostazol for symptomatic improvement. ADMISSION 08/21/2019 This is a 59 year old woman with type 2 diabetes and known  PAD. She had an angiogram in March 2020 by Dr. Fletcher Anon the results of which are shown above. At that time she had been seeing Dr. Amalia Hailey of podiatry with pain and swelling in her left foot she was felt to have capsulitis. She has been using a cam boot. She came to the attention of Dr. Velva Harman in March 2020 after noninvasive studies done on 05/09/2018 showed an ABI on the right of 0.63 with a TBI of 0.21 biphasic waveforms on the  left 0.98 ABI with a TBI of 0.16 monophasic waveforms. Her ABIs were felt to be falsely elevated. She went on to have angiography on 06/06/2018. The results of this are above. She was felt to have good collaterals at that time she did not have any wounds. She had an MRI of the foot and 09/13/2018 that showed marrow edema in the distal calcaneus throughout the midfoot and in the visualized portion of the second and third metatarsals likely due to stress changeo Plantar fasciitis. No fracture or infection The patient states that she has had a wound on the left second toe/callus over the last month. She was seen in the ER and on 2 occasions in late May. On the first she had had cephalexin and doxycycline. She was not having any improvement in the erythema on the dorsal foot which per her description went up to the left medial ankle. On 5/28 she was started on Bactrim which she is in the middle of taking. She has had good improvement in the degree of erythema. Also 2 weeks ago she had a pedicure and she had some bleeding from the first and second toes which became discolored. This actually looks somewhat better than the picture from the ER on 5/28. Past medical history includes type 2 diabetes with known PAD 2. Barrett's esophagus 3. Hypertension 4. Hyperlipidemia ABIs in our clinic were 0.74 on the right and 0.86 on the left. She does not really describe claudication that I can elicit. She is still fairly active Engineer, maintenance) Signed: 08/21/2019 4:41:32 PM By: Linton Ham  MD Entered By: Linton Ham on 08/21/2019 11:08:47 [place], Julie Prince ([id number]) -------------------------------------------------------------------------------- Physical Exam Details Patient Name: Julie Prince Date of Service: 08/21/2019 9:45 AM Medical Record Number: [id number] Patient Account Number: [id number] Date of Birth/Sex: Nov 06, 1960 (59 y.o. F) Treating RN: Cornell Barman Primary Care Provider: [name] Other Clinician: Referring Provider: Noemi Chapel Treating Provider/Extender: Tito Dine in Treatment: 0 Constitutional Patient is hypertensive.. Pulse regular and within target range for patient.Marland Kitchen Respirations regular, non-labored and within target range.. Temperature is normal and within the target range for the patient.Marland Kitchen appears in no distress. Respiratory Respiratory effort is easy and symmetric bilaterally. Rate is normal at rest and on room air.. Cardiovascular Popliteal pulse on the left was faintly palpable. I could not feel her femoral pulse although this was through her clothing. I could not feel either the dorsalis pedis or posterior tibial pulses on the left foot.. Integumentary (Hair, Skin) No erythema around the wound. Neurological Sensation was intact to the microfilament. Notes Wound exam; the area in question is over the dorsal surface of the left fifth toe at the level of the PIP. The patient says this was from footwear trauma. There was callus necrotic debris on the wound surface all of this debrided with a #5 curette there was some purulent drainage which I cultured. No erythema around the wound. The erythema on the dorsal foot extending into the left medial ankle that she described is no longer evident there is no palpable tenderness Electronic Signature(s) Signed: 08/21/2019 4:41:32 PM By: Linton Ham MD Entered By: Linton Ham on 08/21/2019 11:10:55 [place], Julie Prince  ([id number]) -------------------------------------------------------------------------------- Physician Orders Details Patient Name: Julie Prince Date of Service: 08/21/2019 9:45 AM Medical Record Number: [id number] Patient Account Number: [id number] Date of Birth/Sex: [birth year] (58 y.o. F) Treating RN: Cornell Barman Primary Care Provider: [name] Other Clinician: Referring Provider: Noemi Chapel Treating Provider/Extender: Tito Dine in Treatment: 0 Verbal / Phone  Orders: No Diagnosis Coding Wound Cleansing Wound #1 Left Toe Fifth o Clean wound with Normal Saline. o Dial antibacterial soap, wash wounds, rinse and pat dry prior to dressing wounds o No tub bath. Anesthetic (add to Medication List) Wound #1 Left Toe Fifth o Topical Lidocaine 4% cream applied to wound bed prior to debridement (In Clinic Only). Primary Wound Dressing Wound #1 Left Toe Fifth o Silver Alginate Secondary Dressing Wound #1 Left Toe Fifth o Gauze and Kerlix/Conform Dressing Change Frequency Wound #1 Left Toe Fifth o Change dressing every other day. Follow-up Appointments Wound #1 Left Toe Fifth o Return Appointment in 1 week. Off-Loading Wound #1 Left Toe Fifth o Other: Additional Orders / Instructions Wound #1 Left Toe Fifth o Increase protein intake. Medications-please add to medication list. Wound #1 Left Toe Fifth o P.O. Antibiotics - Complete Consults o Vascular - Follow-up Diabetic PAD with open ulcer on lateral 5th toe. Laboratory o Bacteria identified in Wound by Culture (MICRO) - left 5th toe oooo LOINC Code: [id number] oooo Convenience Name: Wound culture routine [place], [place] ([id number]) Electronic Signature(s) Signed: 08/21/2019 4:41:32 PM By: Linton Ham MD Signed: 08/22/2019 1:40:27 PM By: Gretta Cool, BSN, RN, CWS, [name] RN, BSN Entered By: Gretta Cool, BSN, RN, CWS, [name] on 08/21/2019 10:45:16 [place], Julie Prince  ([id number]) -------------------------------------------------------------------------------- Problem List Details Patient Name: Julie Prince Date of Service: 08/21/2019 9:45 AM Medical Record Number: [id number] Patient Account Number: [id number] Date of Birth/Sex: 1960/11/19 (58 y.o. F) Treating RN: Cornell Barman Primary Care Provider: [name] Other Clinician: Referring Provider: Noemi Chapel Treating Provider/Extender: Ricard Dillon Weeks in Treatment: 0 Active Problems ICD-10 Encounter Code Description Active Date MDM Diagnosis E11.621 Type 2 diabetes mellitus with foot ulcer 08/21/2019 No Yes L97.528 Non-pressure chronic ulcer of other part of left foot with other specified 08/21/2019 No Yes severity E11.51 Type 2 diabetes mellitus with diabetic peripheral angiopathy without 08/21/2019 No Yes gangrene L03.116 Cellulitis of left lower limb 08/21/2019 No Yes Inactive Problems Resolved Problems Electronic Signature(s) Signed: 08/21/2019 4:41:32 PM By: Linton Ham MD Entered By: Linton Ham on 08/21/2019 10:49:19 [place], Julie Prince ([id number]) -------------------------------------------------------------------------------- Progress Note Details Patient Name: Julie Prince Date of Service: 08/21/2019 9:45 AM Medical Record Number: [id number] Patient Account Number: [id number] Date of Birth/Sex: 1960/04/21 (58 y.o. F) Treating RN: Cornell Barman Primary Care Provider: [name] Other Clinician: Referring Provider: Noemi Chapel Treating Provider/Extender: Tito Dine in Treatment: 0 Subjective Chief Complaint Information obtained from Patient 08/21/2019; patient is here for review of a wound on the dorsal aspect of the PIP of her left fifth toe History of Present Illness (HPI) Resulted: 06/06/18 1152 Order Status: Completed Updated: 06/06/18 1201 Narrative: 1. No significant aortoiliac disease. 2. Left lower extremity: Flush occlusion of the SFA with  reconstitution via extensive collaterals from the profunda distally. Two-vessel runoff below the knee via the anterior and posterior tibial arteries. 3. 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 One-vessel runoff below the knee via the posterior tibial artery. Recommendations: Recommend maximizing medical therapy as the patient has good collaterals. I am going to start her on small dose cilostazol for symptomatic improvement. ADMISSION 08/21/2019 This is a 59 year old woman with type 2 diabetes and known PAD. She had an angiogram in March 2020 by Dr. Fletcher Anon the results of which are shown above. At that time she had been seeing Dr. Amalia Hailey of podiatry with pain and swelling in her left foot  she was felt to have capsulitis. She has been using a cam boot. She came to the attention of Dr. Velva Harman in March 2020 after noninvasive studies done on 05/09/2018 showed an ABI on the right of 0.63 with a TBI of 0.21 biphasic waveforms on the left 0.98 ABI with a TBI of 0.16 monophasic waveforms. Her ABIs were felt to be falsely elevated. She went on to have angiography on 06/06/2018. The results of this are above. She was felt to have good collaterals at that time she did not have any wounds. She had an MRI of the foot and 09/13/2018 that showed marrow edema in the distal calcaneus throughout the midfoot and in the visualized portion of the second and third metatarsals likely due to stress changeo Plantar fasciitis. No fracture or infection The patient states that she has had a wound on the left second toe/callus over the last month. She was seen in the ER and on 2 occasions in late May. On the first she had had cephalexin and doxycycline. She was not having any improvement in the erythema on the dorsal foot which per her description went up to the left medial ankle. On 5/28 she was  started on Bactrim which she is in the middle of taking. She has had good improvement in the degree of erythema. Also 2 weeks ago she had a pedicure and she had some bleeding from the first and second toes which became discolored. This actually looks somewhat better than the picture from the ER on 5/28. Past medical history includes type 2 diabetes with known PAD 2. Barrett's esophagus 3. Hypertension 4. Hyperlipidemia ABIs in our clinic were 0.74 on the right and 0.86 on the left. She does not really describe claudication that I can elicit. She is still fairly active Patient History Information obtained from Patient. Allergies penicillin (Reaction: hives and shortness of breath), metformin (Reaction: diarrhea) Family History Cancer - Mother, Lung Disease - Mother, No family history of Diabetes, Heart Disease, Hereditary Spherocytosis, Hypertension, Kidney Disease, Seizures, Stroke, Thyroid Problems, Tuberculosis. Social History Former smoker - 7 years, Marital Status - Married, Alcohol Use - Never, Drug Use - No History, Caffeine Use - Rarely. Medical History Cardiovascular Patient has history of Hypertension [place], [place] ([id number]) Denies history of Angina, Arrhythmia, Congestive Heart Failure, Coronary Artery Disease, Deep Vein Thrombosis, Hypotension, Myocardial Infarction, Peripheral Arterial Disease, Peripheral Venous Disease, Phlebitis, Vasculitis Endocrine Patient has history of Type II Diabetes Denies history of Type I Diabetes Integumentary (Skin) Denies history of History of Burn, History of pressure wounds Review of Systems (ROS) Constitutional Symptoms (General Health) Denies complaints or symptoms of Fatigue, Fever, Chills, Marked Weight Change. Eyes Denies complaints or symptoms of Dry Eyes, Vision Changes, Glasses / Contacts. Ear/Nose/Mouth/Throat Denies complaints or symptoms of Difficult clearing ears, Sinusitis. Hematologic/Lymphatic Denies complaints or  symptoms of Bleeding / Clotting Disorders, Human Immunodeficiency Virus. Respiratory Denies complaints or symptoms of Chronic or frequent coughs, Shortness of Breath. Cardiovascular Denies complaints or symptoms of Chest pain, LE edema. Gastrointestinal Denies complaints or symptoms of Frequent diarrhea, Nausea, Vomiting. Endocrine Denies complaints or symptoms of Hepatitis, Thyroid disease, Polydypsia (Excessive Thirst). Genitourinary Denies complaints or symptoms of Kidney failure/ Dialysis, Incontinence/dribbling. Immunological Denies complaints or symptoms of Hives, Itching. Integumentary (Skin) Complains or has symptoms of Wounds. Denies complaints or symptoms of Bleeding or bruising tendency, Breakdown, Swelling. Musculoskeletal Denies complaints or symptoms of Muscle Pain, Muscle Weakness. Neurologic Denies complaints or symptoms of Numbness/parasthesias, Focal/Weakness. Psychiatric Denies complaints or symptoms of Anxiety,  Claustrophobia. Objective Constitutional Patient is hypertensive.. Pulse regular and within target range for patient.Marland Kitchen Respirations regular, non-labored and within target range.. Temperature is normal and within the target range for the patient.Marland Kitchen appears in no distress. Vitals Time Taken: 9:54 AM, Height: 64 in, Source: Stated, Weight: 210 lbs, Source: Measured, BMI: 36, Temperature: 98.3 F, Pulse: 118 bpm, Respiratory Rate: 16 breaths/min, Blood Pressure: 181/85 mmHg. Respiratory Respiratory effort is easy and symmetric bilaterally. Rate is normal at rest and on room air.. Cardiovascular Popliteal pulse on the left was faintly palpable. I could not feel her femoral pulse although this was through her clothing. I could not feel either the dorsalis pedis or posterior tibial pulses on the left foot.. Neurological Sensation was intact to the microfilament. General Notes: Wound exam; the area in question is over the dorsal surface of the left fifth toe at  the level of the PIP. The patient says this was from footwear trauma. There was callus necrotic debris on the wound surface all of this debrided with a #5 curette there was some purulent drainage which I cultured. No erythema around the wound. The erythema on the dorsal foot extending into the left medial ankle that she described is no longer evident there is no palpable tenderness Integumentary (Hair, Skin) No erythema around the wound. Wound #1 status is Open. Original cause of wound was Blister. The wound is located on the Left Toe Fifth. The wound measures 0.6cm length x 1cm width x 0.2cm depth; 0.471cm^2 area and 0.094cm^3 volume. There is Fat Layer (Subcutaneous Tissue) Exposed exposed. There is no [place], Julie Prince ([id number]) tunneling or undermining noted. There is a medium amount of serous drainage noted. The wound margin is flat and intact. There is medium (34- 66%) pink granulation within the wound bed. There is a medium (34-66%) amount of necrotic tissue within the wound bed including Adherent Slough. Assessment Active Problems ICD-10 Type 2 diabetes mellitus with foot ulcer Non-pressure chronic ulcer of other part of left foot with other specified severity Type 2 diabetes mellitus with diabetic peripheral angiopathy without gangrene Cellulitis of left lower limb Procedures Wound #1 Pre-procedure diagnosis of Wound #1 is a Diabetic Wound/Ulcer of the Lower Extremity located on the Left Toe Fifth .Severity of Tissue Pre Debridement is: Fat layer exposed. There was a Excisional Skin/Subcutaneous Tissue Debridement with a total area of 0.6 sq cm performed by Ricard Dillon, MD. With the following instrument(s): Curette, Forceps, and Scissors Material removed includes Callus, Subcutaneous Tissue, and Skin: Dermis after achieving pain control using Lidocaine. 1 specimen was taken by a Swab and sent to the lab per facility protocol. A time out was conducted at 10:32, prior to the  start of the procedure. A Minimum amount of bleeding was controlled with Pressure. The procedure was tolerated well. Post Debridement Measurements: 1.5cm length x 1cm width x 0.4cm depth; 0.471cm^3 volume. Character of Wound/Ulcer Post Debridement is stable. Severity of Tissue Post Debridement is: Fat layer exposed. Post procedure Diagnosis Wound #1: Same as Pre-Procedure Plan Wound Cleansing: Wound #1 Left Toe Fifth: Clean wound with Normal Saline. Dial antibacterial soap, wash wounds, rinse and pat dry prior to dressing wounds No tub bath. Anesthetic (add to Medication List): Wound #1 Left Toe Fifth: Topical Lidocaine 4% cream applied to wound bed prior to debridement (In Clinic Only). Primary Wound Dressing: Wound #1 Left Toe Fifth: Silver Alginate Secondary Dressing: Wound #1 Left Toe Fifth: Gauze and Kerlix/Conform Dressing Change Frequency: Wound #1 Left Toe Fifth: Change dressing every  other day. Follow-up Appointments: Wound #1 Left Toe Fifth: Return Appointment in 1 week. Off-Loading: Wound #1 Left Toe Fifth: Other: Additional Orders / Instructions: Wound #1 Left Toe Fifth: Increase protein intake. Medications-please add to medication list.: Wound #1 Left Toe Fifth: P.O. Antibiotics - Complete Laboratory ordered were: Wound culture routine - left 5th toe Consults ordered were: Vascular - Follow-up Diabetic PAD with open ulcer on lateral 5th toe. [place], [place] ([id number]) 1. We will use silver alginate as the primary dressing 2. Purulent drainage post debridement was obtained for CandS. I did not alter the Bactrim that she is on 3. She apparently had an x-ray done in the ER on 5/28 I will review this in [facility] but the patient's opinion was that this was unremarkable in terms of bone infection 4. The patient did not follow-up with Dr. Fletcher Anon with regards to repeat 8 PAD which by the numbers and description was really quite significant. On the left side  she had two-vessel runoff via the anterior and posterior tibial arteries. She does not describe claudication but nevertheless I think she needs follow-up noninvasive studies and also follow-up with Dr. Fletcher Anon to see his opinion on whether there is enough blood flow to heal the wound in the fifth toe. 5. She is traveling to [place] in 8 days. I cautioned her not to allow footwear trauma on this wound. She is to continue her antibiotics as prescribed. We will notify her if the culture that I did today requires any antibiotic adjustments. Electronic Signature(s) Signed: 08/21/2019 4:41:32 PM By: Linton Ham MD Entered By: Linton Ham on 08/21/2019 11:13:16 [place], Julie Prince ([id number]) -------------------------------------------------------------------------------- ROS/PFSH Details Patient Name: Julie Prince Date of Service: 08/21/2019 9:45 AM Medical Record Number: [id number] Patient Account Number: [id number] Date of Birth/Sex: 05-08-1960 (58 y.o. F) Treating RN: Julie Prince Primary Care Provider: [name] Other Clinician: Referring Provider: Noemi Chapel Treating Provider/Extender: Tito Dine in Treatment: 0 Information Obtained From Patient Constitutional Symptoms (General Health) Complaints and Symptoms: Negative for: Fatigue; Fever; Chills; Marked Weight Change Eyes Complaints and Symptoms: Negative for: Dry Eyes; Vision Changes; Glasses / Contacts Ear/Nose/Mouth/Throat Complaints and Symptoms: Negative for: Difficult clearing ears; Sinusitis Hematologic/Lymphatic Complaints and Symptoms: Negative for: Bleeding / Clotting Disorders; Human Immunodeficiency Virus Respiratory Complaints and Symptoms: Negative for: Chronic or frequent coughs; Shortness of Breath Cardiovascular Complaints and Symptoms: Negative for: Chest pain; LE edema Medical History: Positive for: Hypertension Negative for: Angina; Arrhythmia; Congestive Heart Failure; Coronary  Artery Disease; Deep Vein Thrombosis; Hypotension; Myocardial Infarction; Peripheral Arterial Disease; Peripheral Venous Disease; Phlebitis; Vasculitis Gastrointestinal Complaints and Symptoms: Negative for: Frequent diarrhea; Nausea; Vomiting Endocrine Complaints and Symptoms: Negative for: Hepatitis; Thyroid disease; Polydypsia (Excessive Thirst) Medical History: Positive for: Type II Diabetes Negative for: Type I Diabetes Time with diabetes: 10 years Genitourinary Complaints and Symptoms: Negative for: Kidney failure/ Dialysis; Incontinence/dribbling [place], [place] ([id number]) Immunological Complaints and Symptoms: Negative for: Hives; Itching Integumentary (Skin) Complaints and Symptoms: Positive for: Wounds Negative for: Bleeding or bruising tendency; Breakdown; Swelling Medical History: Negative for: History of Burn; History of pressure wounds Musculoskeletal Complaints and Symptoms: Negative for: Muscle Pain; Muscle Weakness Neurologic Complaints and Symptoms: Negative for: Numbness/parasthesias; Focal/Weakness Psychiatric Complaints and Symptoms: Negative for: Anxiety; Claustrophobia Oncologic Immunizations Pneumococcal Vaccine: Received Pneumococcal Vaccination: No Implantable Devices None Family and Social History Cancer: Yes - Mother; Diabetes: No; Heart Disease: No; Hereditary Spherocytosis: No; Hypertension: No; Kidney Disease: No; Lung Disease: Yes - Mother; Seizures: No; Stroke: No; Thyroid Problems: No; Tuberculosis: No;  Former smoker - 46 years; Marital Status - Married; Alcohol Use: Never; Drug Use: No History; Caffeine Use: Rarely; Financial Concerns: No; Food, Clothing or Shelter Needs: No; Support System Lacking: No; Transportation Concerns: No Electronic Signature(s) Signed: 08/21/2019 4:33:52 PM By: Julie Prince Signed: 08/21/2019 4:41:32 PM By: Linton Ham MD Entered By: Julie Prince on 08/21/2019 10:04:36 [place], Julie Prince  ([id number]) -------------------------------------------------------------------------------- [place] Details Patient Name: Julie Prince Date of Service: 08/21/2019 Medical Record Number: [id number] Patient Account Number: [id number] Date of Birth/Sex: 08-Sep-1960 (59 y.o. 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 E11.51 Type 2 diabetes mellitus with diabetic peripheral angiopathy without gangrene L03.116 Cellulitis of left lower limb Facility Procedures CPT4 Code: [id number] Description: [place] VISIT-LEV 3 EST PT Modifier: Quantity: 1 CPT4 Code: [id number] Description: [id number] - DEB SUBQ TISSUE 20 SQ CM/< Modifier: Quantity: 1 CPT4 Code: Description: ICD-10 Diagnosis Description L97.528 Non-pressure chronic ulcer of other part of left foot with other specified Modifier: severity Quantity: Physician Procedures CPT4 [id number] Description: WC PHYS LEVEL 3 o NEW PT Modifier: 25 Quantity: 1 CPT4 Code: Description: ICD-10 Diagnosis Description E11.621 Type 2 diabetes mellitus with foot ulcer L97.528 Non-pressure chronic ulcer of other part of left foot with other specified Modifier: severity Quantity: CPT4 Code: [id number] Description: 11042 - WC PHYS SUBQ TISS 20 SQ CM Modifier: Quantity: 1 CPT4 Code: Description: ICD-10 Diagnosis Description L97.528 Non-pressure chronic ulcer of other part of left foot with other specified Modifier: severity Quantity: Electronic Signature(s) Signed: 08/21/2019 4:41:32 PM By: Linton Ham MD Entered By: Linton Ham on 08/21/2019 11:13:49

## 2019-08-22 NOTE — ED Notes (Signed)
Pt aware we need urine sample and will advise staff when able to go

## 2019-08-23 ENCOUNTER — Other Ambulatory Visit
Admission: RE | Admit: 2019-08-23 | Discharge: 2019-08-23 | Disposition: A | Discharge status: Home / Self Care | Payer: 59 | Source: Ambulatory Visit | Attending: Internal Medicine | Admitting: Internal Medicine

## 2019-08-23 ENCOUNTER — Telehealth: Payer: Self-pay

## 2019-08-23 DIAGNOSIS — B999 Unspecified infectious disease: Secondary | ICD-10-CM | POA: Insufficient documentation

## 2019-08-23 LAB — URINE CULTURE: Culture: <10000 — AB

## 2019-08-23 NOTE — Telephone Encounter (Signed)
[facility] [phone], SENT REFERRAL TO SCHEDULING

## 2019-08-28 ENCOUNTER — Other Ambulatory Visit: Payer: Self-pay

## 2019-08-28 ENCOUNTER — Encounter: Payer: 59 | Admitting: Internal Medicine

## 2019-08-28 ENCOUNTER — Telehealth: Payer: Self-pay

## 2019-08-28 DIAGNOSIS — E11621 Type 2 diabetes mellitus with foot ulcer: Secondary | ICD-10-CM | POA: Diagnosis not present

## 2019-08-28 LAB — AEROBIC CULTURE W GRAM STAIN (SUPERFICIAL SPECIMEN): Gram Stain: NONE SEEN

## 2019-08-28 NOTE — Telephone Encounter (Signed)
NOTES ON FILE [facility] [phone] SENT REFERRAL TO SCHEDULING

## 2019-08-28 NOTE — Progress Notes (Signed)
[place], [place] ([id number]) Visit Report for 08/28/2019 Arrival Information Details Patient Name: Julie Prince Date of Service: 08/28/2019 8:00 AM Medical Record Number: [id number] Patient Account Number: [id number] Date of Birth/Sex: 1960-09-05 (59 y.o. F) Treating RN: Army Melia Primary Care Milderd Manocchio: [name] Other Clinician: Referring Bufford Helms: [name] Treating Joella Saefong/Extender: Tito Dine in Treatment: 1 Visit Information History Since Last Visit Added or deleted any medications: No Patient Arrived: Ambulatory Any new allergies or adverse reactions: No Arrival Time: 08:07 Had a fall or experienced change in No Accompanied By: daughter activities of daily living that may affect Transfer Assistance: None risk of falls: Patient Identification Verified: Yes Signs or symptoms of abuse/neglect since last visito No Patient Has Alerts: Yes Hospitalized since last visit: No Patient Alerts: DMII Has Dressing in Place as Prescribed: Yes Pain Present Now: No Electronic Signature(s) Signed: 08/28/2019 10:50:28 AM By: Army Melia Entered By: Army Melia on 08/28/2019 08:08:05 [place], [name] ([id number]) -------------------------------------------------------------------------------- Encounter Discharge Information Details Patient Name: Julie Prince Date of Service: 08/28/2019 8:00 AM Medical Record Number: [id number] Patient Account Number: [id number] Date of Birth/Sex: 1960-05-14 (58 y.o. F) Treating RN: Montey Hora Primary Care Margarethe Virgen: [name] Other Clinician: Referring Kinte Trim: [name] Treating Khala Tarte/Extender: Tito Dine in Treatment: 1 Encounter Discharge Information Items Post Procedure Vitals Discharge Condition: Stable Temperature (F): 98.3 Ambulatory Status: Ambulatory Pulse (bpm): 90 Discharge Destination: Home Respiratory Rate (breaths/min): 16 Transportation: Private Auto Blood Pressure (mmHg):  168/76 Accompanied By: daughter Schedule Follow-up Appointment: Yes Clinical Summary of Care: Electronic Signature(s) Signed: 08/28/2019 4:13:29 PM By: Montey Hora Entered By: Montey Hora on 08/28/2019 08:24:28 [place], [name] ([id number]) -------------------------------------------------------------------------------- Lower Extremity Assessment Details Patient Name: Julie Prince Date of Service: 08/28/2019 8:00 AM Medical Record Number: [id number] Patient Account Number: [id number] Date of Birth/Sex: 1961-02-23 (58 y.o. F) Treating RN: Army Melia Primary Care Nussen Pullin: [name] Other Clinician: Referring Shakiyah Cirilo: [name] Treating Vikram Tillett/Extender: Ricard Dillon Weeks in Treatment: 1 Edema Assessment Assessed: [Left: No] [Right: No] Edema: [Left: N] [Right: o] Vascular Assessment Pulses: Dorsalis Pedis Palpable: [Left:Yes] Electronic Signature(s) Signed: 08/28/2019 10:50:28 AM By: Army Melia Entered By: Army Melia on 08/28/2019 08:11:26 [place], [place] ([id number]) -------------------------------------------------------------------------------- Multi Wound Chart Details Patient Name: Julie Prince Date of Service: 08/28/2019 8:00 AM Medical Record Number: [id number] Patient Account Number: [id number] Date of Birth/Sex: May 03, 1960 (58 y.o. F) Treating RN: Montey Hora Primary Care Kahli Mayon: [name] Other Clinician: Referring Aino Heckert: [name] Treating Nekayla Heider/Extender: Tito Dine in Treatment: 1 Vital Signs Height(in): 64 Pulse(bpm): 90 Weight(lbs): 210 Blood Pressure(mmHg): 168/76 Body Mass Index(BMI): 36 Temperature(F): 98.3 Respiratory Rate(breaths/min): 16 Photos: [N/A:N/A] Wound Location: Left Toe Fifth N/A N/A Wounding Event: Blister N/A N/A Primary Etiology: Diabetic Wound/Ulcer of the Lower N/A N/A Extremity Comorbid History: Hypertension, Type II Diabetes N/A N/A Date Acquired: 08/07/2019 N/A  N/A Weeks of Treatment: 1 N/A N/A Wound Status: Open N/A N/A Measurements L x W x D (cm) 0.8x0.7x0.1 N/A N/A Area (cm) : 0.44 N/A N/A Volume (cm) : 0.044 N/A N/A % Reduction in Area: 6.60% N/A N/A % Reduction in Volume: 53.20% N/A N/A Classification: Grade 1 N/A N/A Exudate Amount: Medium N/A N/A Exudate Type: Serous N/A N/A Exudate Color: amber N/A N/A Wound Margin: Flat and Intact N/A N/A Granulation Amount: Medium (34-66%) N/A N/A Granulation Quality: Pink N/A N/A Necrotic Amount: Medium (34-66%) N/A N/A Exposed Structures: Fat Layer (Subcutaneous Tissue) N/A N/A Exposed: Yes Fascia: No Tendon: No Muscle: No Joint: No Bone: No Epithelialization: None N/A N/A  Debridement: Debridement - Excisional N/A N/A Pre-procedure Verification/Time 08:19 N/A N/A Out Taken: Pain Control: Lidocaine 4% Topical Solution N/A N/A Tissue Debrided: Subcutaneous, Slough N/A N/A Level: Skin/Subcutaneous Tissue N/A N/A Debridement Area (sq cm): 0.56 N/A N/A Instrument: Curette N/A N/A Bleeding: Minimum N/A N/A Hemostasis Achieved: Pressure N/A N/A Procedural Pain: 0 N/A N/A Post Procedural Pain: 0 N/A N/A Procedure was tolerated well N/A N/A [place], Shonta ([id number]) Debridement Treatment Response: Post Debridement 0.9x0.9x0.3 N/A N/A Measurements L x W x D (cm) Post Debridement Volume: 0.191 N/A N/A (cm) Procedures Performed: Debridement N/A N/A Treatment Notes Wound #1 (Left Toe Fifth) Notes SIlvercell, gauze and conform Electronic Signature(s) Signed: 08/28/2019 4:11:49 PM By: Linton Ham MD Entered By: Linton Ham on 08/28/2019 08:27:02 [place], [name] ([id number]) -------------------------------------------------------------------------------- Multi-Disciplinary Care Plan Details Patient Name: Julie Prince Date of Service: 08/28/2019 8:00 AM Medical Record Number: [id number] Patient Account Number: [id number] Date of Birth/Sex: Nov 15, 1960 (59 y.o. F) Treating RN:  Montey Hora Primary Care Berlinda Farve: [name] Other Clinician: Referring Khya Halls: [name] Treating Denesia Donelan/Extender: Tito Dine in Treatment: 1 Active Inactive Necrotic Tissue Nursing Diagnoses: Impaired tissue integrity related to necrotic/devitalized tissue Goals: Necrotic/devitalized tissue will be minimized in the wound bed Date Initiated: 08/21/2019 Target Resolution Date: 08/28/2019 Goal Status: Active Interventions: Assess patient pain level pre-, during and post procedure and prior to discharge Treatment Activities: Apply topical anesthetic as ordered : 08/21/2019 Notes: Orientation to the Wound Care Program Nursing Diagnoses: Knowledge deficit related to the wound healing center program Goals: Patient/caregiver will verbalize understanding of the [place] [facility] Date Initiated: 08/21/2019 Target Resolution Date: 08/29/2019 Goal Status: Active Interventions: Provide education on orientation to the wound center Notes: Soft Tissue Infection Nursing Diagnoses: Impaired tissue integrity Goals: Patient will remain free of wound infection Date Initiated: 08/21/2019 Target Resolution Date: 08/28/2019 Goal Status: Active Interventions: Assess signs and symptoms of infection every visit Treatment Activities: Systemic antibiotics : 08/21/2019 Notes: Tissue Oxygenation Nursing Diagnoses: Potential alteration in peripheral tissue perfusion (select prior to confirmation of diagnosis) [place], [name] ([id number]) Goals: Patient/caregiver will verbalize understanding of disease process and disease management Date Initiated: 08/21/2019 Target Resolution Date: 08/28/2019 Goal Status: Active Interventions: Assess patient understanding of disease process and management upon diagnosis and as needed Assess peripheral arterial status upon admission and as needed Provide education on tissue oxygenation and ischemia Notes: Wound/Skin Impairment Nursing  Diagnoses: Impaired tissue integrity Goals: Ulcer/skin breakdown will have a volume reduction of 30% by week 4 Date Initiated: 08/21/2019 Target Resolution Date: 08/28/2019 Goal Status: Active Interventions: Provide education on ulcer and skin care Notes: Electronic Signature(s) Signed: 08/28/2019 4:13:29 PM By: Montey Hora Entered By: Montey Hora on 08/28/2019 08:16:00 [place], [place] ([id number]) -------------------------------------------------------------------------------- Pain Assessment Details Patient Name: Julie Prince Date of Service: 08/28/2019 8:00 AM Medical Record Number: [id number] Patient Account Number: [id number] Date of Birth/Sex: 1960-09-04 (59 y.o. F) Treating RN: Army Melia Primary Care Latrecia Capito: [name] Other Clinician: Referring Clarisa Danser: [name] Treating Adahlia Stembridge/Extender: Ricard Dillon Weeks in Treatment: 1 Active Problems Location of Pain Severity and Description of Pain Patient Has Paino No Site Locations Pain Management and Medication Current Pain Management: Electronic Signature(s) Signed: 08/28/2019 10:50:28 AM By: Army Melia Entered By: Army Melia on 08/28/2019 08:08:36 [place], [place] ([id number]) -------------------------------------------------------------------------------- Patient/Caregiver Education Details Patient Name: Julie Prince Date of Service: 08/28/2019 8:00 AM Medical Record Number: [id number] Patient Account Number: [id number] Date of Birth/Gender: August 19, 1960 (58 y.o. F) Treating RN: Montey Hora Primary Care Physician: [name] Other Clinician: Referring Physician:  Mariel Kansky, MARITZA Treating Physician/Extender: Tito Dine in Treatment: 1 Education Assessment Education Provided To: Patient and Caregiver Education Topics Provided Wound/Skin Impairment: Handouts: Other: wound care as ordered Methods: Demonstration, Explain/Verbal Responses: State content correctly Electronic  Signature(s) Signed: 08/28/2019 4:13:29 PM By: Montey Hora Entered By: Montey Hora on 08/28/2019 08:23:39 [place], [place] ([id number]) -------------------------------------------------------------------------------- Wound Assessment Details Patient Name: Julie Prince Date of Service: 08/28/2019 8:00 AM Medical Record Number: [id number] Patient Account Number: [id number] Date of Birth/Sex: 27-Apr-1960 (59 y.o. F) Treating RN: Army Melia Primary Care Riki Berninger: [name] Other Clinician: Referring Cedarius Kersh: [name] Treating Ahnna Dungan/Extender: Tito Dine in Treatment: 1 Wound Status Wound Number: 1 Primary Etiology: Diabetic Wound/Ulcer of the Lower Extremity Wound Location: Left Toe Fifth Wound Status: Open Wounding Event: Blister Comorbid History: Hypertension, Type II Diabetes Date Acquired: 08/07/2019 Weeks Of Treatment: 1 Clustered Wound: No Photos Wound Measurements Length: (cm) 0.8 Width: (cm) 0.7 Depth: (cm) 0.1 Area: (cm) 0.44 Volume: (cm) 0.044 % Reduction in Area: 6.6% % Reduction in Volume: 53.2% Epithelialization: None Wound Description Classification: Grade 1 Fo Wound Margin: Flat and Intact Sl Exudate Amount: Medium Exudate Type: Serous Exudate Color: amber ul Odor After Cleansing: No ough/Fibrino Yes Wound Bed Granulation Amount: Medium (34-66%) Exposed Structure Granulation Quality: Pink Fascia Exposed: No Necrotic Amount: Medium (34-66%) Fat Layer (Subcutaneous Tissue) Exposed: Yes Necrotic Quality: Adherent Slough Tendon Exposed: No Muscle Exposed: No Joint Exposed: No Bone Exposed: No Treatment Notes Wound #1 (Left Toe Fifth) Notes SIlvercell, gauze and conform Electronic Signature(s) Signed: 08/28/2019 10:50:28 AM By: Velia Meyer, Sascha ([id number]) Entered By: Army Melia on 08/28/2019 08:10:37 [place], Takiyah  ([id number]) -------------------------------------------------------------------------------- Vitals Details Patient Name: Julie Prince Date of Service: 08/28/2019 8:00 AM Medical Record Number: [id number] Patient Account Number: [id number] Date of Birth/Sex: Sep 21, 1960 (59 y.o. F) Treating RN: Army Melia Primary Care Sehaj Kolden: [name] Other Clinician: Referring Khyla Mccumbers: [name] Treating Arihaan Bellucci/Extender: Tito Dine in Treatment: 1 Vital Signs Time Taken: 08:08 Temperature (F): 98.3 Height (in): 64 Pulse (bpm): 90 Weight (lbs): 210 Respiratory Rate (breaths/min): 16 Body Mass Index (BMI): 36 Blood Pressure (mmHg): 168/76 Reference Range: 80 - 120 mg / dl Electronic Signature(s) Signed: 08/28/2019 10:50:28 AM By: Army Melia Entered By: Army Melia on 08/28/2019 68:11:57

## 2019-08-30 NOTE — Progress Notes (Signed)
[place], [place] ([id number]) Visit Report for 08/28/2019 Debridement Details Patient Name: Julie Prince, Julie Prince Date of Service: 08/28/2019 8:00 AM Medical Record Number: [id number] Patient Account Number: [id number] Date of Birth/Sex: 1960-09-05 (59 y.o. F) Treating RN: Cornell Barman Primary Care Provider: [name] Other Clinician: Referring Provider: [name] Treating Provider/Extender: Tito Dine in Treatment: 1 Debridement Performed for Wound #1 Left Toe Fifth Assessment: Performed By: Physician Ricard Dillon, MD Debridement Type: Debridement Severity of Tissue Pre Debridement: Fat layer exposed Level of Consciousness (Pre- Awake and Alert procedure): Pre-procedure Verification/Time Out Yes - 08:19 Taken: Start Time: 08:19 Pain Control: Lidocaine 4% Topical Solution Total Area Debrided (L x W): 0.8 (cm) x 0.7 (cm) = 0.56 (cm) Tissue and other material Viable, Non-Viable, Slough, Subcutaneous, Slough debrided: Level: Skin/Subcutaneous Tissue Debridement Description: Excisional Instrument: Curette Bleeding: Minimum Hemostasis Achieved: Pressure End Time: 08:22 Procedural Pain: 0 Post Procedural Pain: 0 Response to Treatment: Procedure was tolerated well Level of Consciousness (Post- Awake and Alert procedure): Post Debridement Measurements of Total Wound Length: (cm) 0.9 Width: (cm) 0.9 Depth: (cm) 0.3 Volume: (cm) 0.191 Character of Wound/Ulcer Post Debridement: Improved Severity of Tissue Post Debridement: Fat layer exposed Post Procedure Diagnosis Same as Pre-procedure Electronic Signature(s) Signed: 08/28/2019 4:11:49 PM By: Linton Ham MD Signed: 08/30/2019 12:58:54 PM By: Gretta Cool, BSN, RN, CWS, [name] RN, BSN Entered By: Linton Ham on 08/28/2019 08:27:10 [place], [place] ([id number]) -------------------------------------------------------------------------------- HPI Details Patient Name: Julie Prince Date of Service: 08/28/2019 8:00  AM Medical Record Number: [id number] Patient Account Number: [id number] Date of Birth/Sex: 02-11-1961 (59 y.o. F) Treating RN: Cornell Barman Primary Care Provider: [name] Other Clinician: Referring Provider: [name] Treating Provider/Extender: Ricard Dillon Weeks in Treatment: 1 History of Present Illness HPI Description: Resulted: 06/06/18 1152 Order Status: Completed Updated: 06/06/18 1201 Narrative: o 1. oNo significant aortoiliac disease. 2. oLeft lower extremity: Flush occlusion of the SFA with reconstitution via extensive collaterals from the profunda distally. oTwo-vessel runoff below the knee via the anterior and posterior tibial arteries. 3. oRight lower extremity: Diffuse SFA disease proximally followed by a total occlusion in the midsegment with reconstitution distally via extensive collaterals from the profunda. oOne-vessel runoff below the knee via the posterior tibial artery. Recommendations: Recommend maximizing medical therapy as the patient has good collaterals. o I am going to start her on small dose cilostazol for symptomatic improvement. ADMISSION 08/21/2019 This is a 59 year old woman with type 2 diabetes and known PAD. She had an angiogram in March 2020 by Dr. Fletcher Anon the results of which are shown above. At that time she had been seeing Dr. Amalia Hailey of podiatry with pain and swelling in her left foot she was felt to have capsulitis. She has been using a cam boot. She came to the attention of Dr. Velva Harman in March 2020 after noninvasive studies done on 05/09/2018 showed an ABI on the right of 0.63 with a TBI of 0.21 biphasic waveforms on the left 0.98 ABI with a TBI of 0.16 monophasic waveforms. Her ABIs were felt to be falsely elevated. She went on to have angiography on 06/06/2018. The results of this are above. She was felt to have good collaterals at that time she did not have any wounds. She had an MRI of the foot and 09/13/2018 that showed marrow edema in  the distal calcaneus throughout the midfoot and in the visualized portion of the second and third metatarsals likely due to stress changeo Plantar fasciitis. No fracture or infection The patient states that she has  had a wound on the left second toe/callus over the last month. She was seen in the ER and on 2 occasions in late May. On the first she had had cephalexin and doxycycline. She was not having any improvement in the erythema on the dorsal foot which per her description went up to the left medial ankle. On 5/28 she was started on Bactrim which she is in the middle of taking. She has had good improvement in the degree of erythema. Also 2 weeks ago she had a pedicure and she had some bleeding from the first and second toes which became discolored. This actually looks somewhat better than the picture from the ER on 5/28. Past medical history includes type 2 diabetes with known PAD 2. Barrett's esophagus 3. Hypertension 4. Hyperlipidemia ABIs in our clinic were 0.74 on the right and 0.86 on the left. She does not really describe claudication that I can elicit. She is still fairly active 6/9; patient comes back to clinic today for follow-up of the ulcer over her left fifth PIP on the left foot. Culture I did last week showed methicillin sensitive staph aureus which should have been sensitive to the Bactrim DS she was already put on by her primary physician. Noteworthy that she did not tolerate the Bactrim DS very well because of nausea and vomiting. She is allergic to penicillin. She is using silver alginate on the wound. For 1 reason or another we were not able to arrange a follow-up with Dr. Fletcher Anon to follow-up on her PAD. We will retry to resend the information Electronic Signature(s) Signed: 08/28/2019 4:11:49 PM By: Linton Ham MD Entered By: Linton Ham on 08/28/2019 08:29:42 [place], [name]  ([id number]) -------------------------------------------------------------------------------- Physical Exam Details Patient Name: Julie Prince Date of Service: 08/28/2019 8:00 AM Medical Record Number: [id number] Patient Account Number: [id number] Date of Birth/Sex: [birth year] (59 y.o. F) Treating RN: Cornell Barman Primary Care Provider: [name] Other Clinician: Referring Provider: [name] Treating Provider/Extender: Tito Dine in Treatment: 1 Constitutional Patient is hypertensive.. Pulse regular and within target range for patient.Marland Kitchen Respirations regular, non-labored and within target range.. Temperature is normal and within the target range for the patient.Marland Kitchen appears in no distress. Cardiovascular Could not feel the femoral pulse although that is through her clothing. I could not feel the pedal pulses in the left foot nor the popliteal pulse.. Notes Wound exam; the area in question is on the dorsal surface of the left fifth toe at the level of the PIP. She came in with an eschared surface I remove this. Underneath this there was a mild amount of purulent drainage. I did not reculture this necrotic tissue removed with a #3 curette. I am able to get the surface down to a clean granulating base however this is precariously close to bone or the underlying joint itself. Electronic Signature(s) Signed: 08/28/2019 4:11:49 PM By: Linton Ham MD Entered By: Linton Ham on 08/28/2019 08:32:30 [place], [place]ah ([id number]) -------------------------------------------------------------------------------- Physician Orders Details Patient Name: Julie Prince Date of Service: 08/28/2019 8:00 AM Medical Record Number: [id number] Patient Account Number: [id number] Date of Birth/Sex: March 21, 1961 (58 y.o. F) Treating RN: Montey Hora Primary Care Provider: [name] Other Clinician: Referring Provider: [name] Treating Provider/Extender: Tito Dine  in Treatment: 1 Verbal / Phone Orders: No Diagnosis Coding Wound Cleansing Wound #1 Left Toe Fifth o Clean wound with Normal Saline. o Dial antibacterial soap, wash wounds, rinse and pat dry prior to dressing wounds o No tub bath. Anesthetic (add  to Medication List) Wound #1 Left Toe Fifth o Topical Lidocaine 4% cream applied to wound bed prior to debridement (In Clinic Only). Primary Wound Dressing Wound #1 Left Toe Fifth o Silver Alginate - thin layer of bacitracin to wound bed Secondary Dressing Wound #1 Left Toe Fifth o Gauze and Kerlix/Conform Dressing Change Frequency Wound #1 Left Toe Fifth o Change dressing every other day. Follow-up Appointments Wound #1 Left Toe Fifth o Return Appointment in 2 weeks. Off-Loading Wound #1 Left Toe Fifth o Other: Additional Orders / Instructions Wound #1 Left Toe Fifth o Increase protein intake. Medications-please add to medication list. Wound #1 Left Toe Fifth o P.O. Antibiotics - Complete Patient Medications Allergies: penicillin, metformin Notifications Medication Indication Start End Levaquin MSSA wouind 08/28/2019 infection DOSE oral 500 mg tablet - 1 tablet oral qd for 7 days [place], [place] ([id number]) Electronic Signature(s) Signed: 08/28/2019 8:36:31 AM By: Linton Ham MD Entered By: Linton Ham on 08/28/2019 08:36:31 [place], Charice ([id number]) -------------------------------------------------------------------------------- Problem List Details Patient Name: Julie Prince Date of Service: 08/28/2019 8:00 AM Medical Record Number: [id number] Patient Account Number: [id number] Date of Birth/Sex: [birth year] (59 y.o. F) Treating RN: Cornell Barman Primary Care Provider: [name] Other Clinician: Referring Provider: [name] Treating Provider/Extender: Ricard Dillon Weeks in Treatment: 1 Active Problems ICD-10 Encounter Code Description Active Date MDM Diagnosis E11.621 Type 2  diabetes mellitus with foot ulcer 08/21/2019 No Yes L97.528 Non-pressure chronic ulcer of other part of left foot with other specified 08/21/2019 No Yes severity E11.51 Type 2 diabetes mellitus with diabetic peripheral angiopathy without 08/21/2019 No Yes gangrene L03.116 Cellulitis of left lower limb 08/21/2019 No Yes A49.01 Methicillin susceptible Staphylococcus aureus infection, unspecified site 08/28/2019 No Yes Inactive Problems Resolved Problems Electronic Signature(s) Signed: 08/28/2019 4:11:49 PM By: Linton Ham MD Entered By: Linton Ham on 08/28/2019 08:26:54 [place], [name] ([id number]) -------------------------------------------------------------------------------- Progress Note Details Patient Name: Julie Prince Date of Service: 08/28/2019 8:00 AM Medical Record Number: [id number] Patient Account Number: [id number] Date of Birth/Sex: 04-Nov-1960 (58 y.o. F) Treating RN: Cornell Barman Primary Care Provider: [name] Other Clinician: Referring Provider: [name] Treating Provider/Extender: Ricard Dillon Weeks in Treatment: 1 Subjective History of Present Illness (HPI) Resulted: 06/06/18 1152 Order Status: Completed Updated: 06/06/18 1201 Narrative: 1. No significant aortoiliac disease. 2. Left lower extremity: Flush occlusion of the SFA with reconstitution via extensive collaterals from the profunda distally. Two-vessel runoff below the knee via the anterior and posterior tibial arteries. 3. 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 One-vessel runoff below the knee via the posterior tibial artery. Recommendations: Recommend maximizing medical therapy as the patient has good collaterals. I am going to start her on small dose cilostazol for symptomatic improvement. ADMISSION 08/21/2019 This is a 59 year old woman with type 2  diabetes and known PAD. She had an angiogram in March 2020 by Dr. Fletcher Anon the results of which are shown above. At that time she had been seeing Dr. Amalia Hailey of podiatry with pain and swelling in her left foot she was felt to have capsulitis. She has been using a cam boot. She came to the attention of Dr. Velva Harman in March 2020 after noninvasive studies done on 05/09/2018 showed an ABI on the right of 0.63 with a TBI of 0.21 biphasic waveforms on the left 0.98 ABI with a TBI of 0.16 monophasic waveforms. Her ABIs were felt to be falsely elevated. She went on to  have angiography on 06/06/2018. The results of this are above. She was felt to have good collaterals at that time she did not have any wounds. She had an MRI of the foot and 09/13/2018 that showed marrow edema in the distal calcaneus throughout the midfoot and in the visualized portion of the second and third metatarsals likely due to stress changeo Plantar fasciitis. No fracture or infection The patient states that she has had a wound on the left second toe/callus over the last month. She was seen in the ER and on 2 occasions in late May. On the first she had had cephalexin and doxycycline. She was not having any improvement in the erythema on the dorsal foot which per her description went up to the left medial ankle. On 5/28 she was started on Bactrim which she is in the middle of taking. She has had good improvement in the degree of erythema. Also 2 weeks ago she had a pedicure and she had some bleeding from the first and second toes which became discolored. This actually looks somewhat better than the picture from the ER on 5/28. Past medical history includes type 2 diabetes with known PAD 2. Barrett's esophagus 3. Hypertension 4. Hyperlipidemia ABIs in our clinic were 0.74 on the right and 0.86 on the left. She does not really describe claudication that I can elicit. She is still fairly active 6/9; patient comes back to clinic today for follow-up of  the ulcer over her left fifth PIP on the left foot. Culture I did last week showed methicillin sensitive staph aureus which should have been sensitive to the Bactrim DS she was already put on by her primary physician. Noteworthy that she did not tolerate the Bactrim DS very well because of nausea and vomiting. She is allergic to penicillin. She is using silver alginate on the wound. For 1 reason or another we were not able to arrange a follow-up with Dr. Fletcher Anon to follow-up on her PAD. We will retry to resend the information Objective Constitutional Patient is hypertensive.. Pulse regular and within target range for patient.Marland Kitchen Respirations regular, non-labored and within target range.. Temperature is normal and within the target range for the patient.Marland Kitchen appears in no distress. Vitals Time Taken: 8:08 AM, Height: 64 in, Weight: 210 lbs, BMI: 36, Temperature: 98.3 F, Pulse: 90 bpm, Respiratory Rate: 16 breaths/min, Blood Pressure: 168/76 mmHg. [place], [place] ([id number]) Cardiovascular Could not feel the femoral pulse although that is through her clothing. I could not feel the pedal pulses in the left foot nor the popliteal pulse.. General Notes: Wound exam; the area in question is on the dorsal surface of the left fifth toe at the level of the PIP. She came in with an eschared surface I remove this. Underneath this there was a mild amount of purulent drainage. I did not reculture this necrotic tissue removed with a #3 curette. I am able to get the surface down to a clean granulating base however this is precariously close to bone or the underlying joint itself. Integumentary (Hair, Skin) Wound #1 status is Open. Original cause of wound was Blister. The wound is located on the Left Toe Fifth. The wound measures 0.8cm length x 0.7cm width x 0.1cm depth; 0.44cm^2 area and 0.044cm^3 volume. There is Fat Layer (Subcutaneous Tissue) Exposed exposed. There is a medium amount of serous drainage noted. The  wound margin is flat and intact. There is medium (34-66%) pink granulation within the wound bed. There is a medium (34-66%) amount of  necrotic tissue within the wound bed including Adherent Slough. Assessment Active Problems ICD-10 Type 2 diabetes mellitus with foot ulcer Non-pressure chronic ulcer of other part of left foot with other specified severity Type 2 diabetes mellitus with diabetic peripheral angiopathy without gangrene Cellulitis of left lower limb Methicillin susceptible Staphylococcus aureus infection, unspecified site Procedures Wound #1 Pre-procedure diagnosis of Wound #1 is a Diabetic Wound/Ulcer of the Lower Extremity located on the Left Toe Fifth .Severity of Tissue Pre Debridement is: Fat layer exposed. There was a Excisional Skin/Subcutaneous Tissue Debridement with a total area of 0.56 sq cm performed by Ricard Dillon, MD. With the following instrument(s): Curette to remove Viable and Non-Viable tissue/material. Material removed includes Subcutaneous Tissue and Slough and after achieving pain control using Lidocaine 4% Topical Solution. No specimens were taken. A time out was conducted at 08:19, prior to the start of the procedure. A Minimum amount of bleeding was controlled with Pressure. The procedure was tolerated well with a pain level of 0 throughout and a pain level of 0 following the procedure. Post Debridement Measurements: 0.9cm length x 0.9cm width x 0.3cm depth; 0.191cm^3 volume. Character of Wound/Ulcer Post Debridement is improved. Severity of Tissue Post Debridement is: Fat layer exposed. Post procedure Diagnosis Wound #1: Same as Pre-Procedure Plan Wound Cleansing: Wound #1 Left Toe Fifth: Clean wound with Normal Saline. Dial antibacterial soap, wash wounds, rinse and pat dry prior to dressing wounds No tub bath. Anesthetic (add to Medication List): Wound #1 Left Toe Fifth: Topical Lidocaine 4% cream applied to wound bed prior to debridement (In  Clinic Only). Primary Wound Dressing: Wound #1 Left Toe Fifth: Silver Alginate - thin layer of bacitracin to wound bed Secondary Dressing: Wound #1 Left Toe Fifth: Gauze and Kerlix/Conform Dressing Change Frequency: Wound #1 Left Toe Fifth: Change dressing every other day. Follow-up Appointments: Wound #1 Left Toe Fifth: Return Appointment in 2 weeks. Off-Loading: Wound #1 Left Toe Fifth: [place], Ashli ([id number]) Other: Additional Orders / Instructions: Wound #1 Left Toe Fifth: Increase protein intake. Medications-please add to medication list.: Wound #1 Left Toe Fifth: P.O. Antibiotics - Complete The following medication(s) was prescribed: Levaquin oral 500 mg tablet 1 tablet oral qd for 7 days for MSSA wouind infection starting 08/28/2019 1. Methicillin staph aureus infection. After debridement today there was still purulent drainage on the wound surface here. 2. I reviewed her x-ray from the end of May there was no evidence of osteomyelitis but I remain somewhat concerned given the depth of this wound. 3. She is allergic to penicillin and seemingly intolerant of trimethoprim sulfamethoxazole. I therefore gave her 7 days of Levaquin in spite of the hypoglycemia worries and also QT prolongation of cilostazol although I could not find the latter drug interaction in epocrates. I am concerned about this being a toe threatening problem and and the setting of PAD a full leg threatening problem 4. I am not really sure why we did not get a follow-up with Dr. Fletcher Anon last week we are read to reattempting this. She is at risk here. She has had a previous angiogram. I think she needs at least repeat arterial studies if not a repeat angiogram 5. She may need a MRI of her foot however she is traveling next week to [place] and will be back for 10 days 6. I continued with silver alginate. I have asked her to place bacitracin on this under the silver alginate antibacterial. 7. She is traveling  to [place] for 10 days cautioned about  foot wear, friction, pressure Electronic Signature(s) Signed: 08/28/2019 4:11:49 PM By: Linton Ham MD Entered By: Linton Ham on 08/28/2019 08:58:37 [place], [place] ([id number]) -------------------------------------------------------------------------------- SuperBill Details Patient Name: Julie Prince Date of Service: 08/28/2019 Medical Record Number: [id number] Patient Account Number: [id number] Date of Birth/Sex: March 17, 1961 (58 y.o. F) Treating RN: Cornell Barman Primary Care Provider: [name] Other Clinician: Referring Provider: [name] Treating Provider/Extender: Ricard Dillon Weeks in Treatment: 1 Diagnosis Coding ICD-10 Codes Code Description E11.621 Type 2 diabetes mellitus with foot ulcer L97.528 Non-pressure chronic ulcer of other part of left foot with other specified severity E11.51 Type 2 diabetes mellitus with diabetic peripheral angiopathy without gangrene L03.116 Cellulitis of left lower limb A49.01 Methicillin susceptible Staphylococcus aureus infection, unspecified site Facility Procedures CPT4 Code: 29937169 Description: 67893 - DEB SUBQ TISSUE 20 SQ CM/< Modifier: Quantity: 1 CPT4 Code: Description: ICD-10 Diagnosis Description L97.528 Non-pressure chronic ulcer of other part of left foot with other specified E11.621 Type 2 diabetes mellitus with foot ulcer Modifier: severity Quantity: Physician Procedures CPT4 Code: 8101751 Description: 02585 - WC PHYS SUBQ TISS 20 SQ CM Modifier: Quantity: 1 CPT4 Code: Description: ICD-10 Diagnosis Description L97.528 Non-pressure chronic ulcer of other part of left foot with other specified E11.621 Type 2 diabetes mellitus with foot ulcer Modifier: severity Quantity: Electronic Signature(s) Signed: 08/28/2019 4:11:49 PM By: Linton Ham MD Entered By: Linton Ham on 08/28/2019 08:40:06

## 2019-09-05 ENCOUNTER — Telehealth: Payer: Self-pay | Admitting: Cardiovascular Disease

## 2019-09-05 NOTE — Telephone Encounter (Signed)
Patient aware OK for daughter to come to appt

## 2019-09-05 NOTE — Telephone Encounter (Signed)
New Message   Pt is calling and says her daughter will need to assist her to her appt because she uses a wheel chair    Please advise

## 2019-09-10 ENCOUNTER — Ambulatory Visit (INDEPENDENT_AMBULATORY_CARE_PROVIDER_SITE_OTHER): Payer: Self-pay | Admitting: Cardiovascular Disease

## 2019-09-10 ENCOUNTER — Other Ambulatory Visit: Payer: Self-pay

## 2019-09-10 ENCOUNTER — Encounter: Payer: Self-pay | Admitting: Cardiovascular Disease

## 2019-09-10 VITALS — BP 138/84 | HR 113 | Ht 64.0 in | Wt 213.4 lb

## 2019-09-10 DIAGNOSIS — I739 Peripheral vascular disease, unspecified: Secondary | ICD-10-CM

## 2019-09-10 DIAGNOSIS — E785 Hyperlipidemia, unspecified: Secondary | ICD-10-CM

## 2019-09-10 DIAGNOSIS — I4711 Inappropriate sinus tachycardia, so stated: Secondary | ICD-10-CM

## 2019-09-10 DIAGNOSIS — R000 Tachycardia, unspecified: Secondary | ICD-10-CM

## 2019-09-10 DIAGNOSIS — I10 Essential (primary) hypertension: Secondary | ICD-10-CM

## 2019-09-10 LAB — BASIC METABOLIC PANEL
BUN/Creatinine Ratio: 11 (ref 9–23)
BUN: 10 mg/dL (ref 6–24)
CO2: 26 mmol/L (ref 20–29)
Calcium: 9.5 mg/dL (ref 8.7–10.2)
Chloride: 102 mmol/L (ref 96–106)
Creatinine, Ser: 0.89 mg/dL (ref 0.57–1.00)
GFR calc Af Amer: 83 mL/min/{1.73_m2} (ref >59)
GFR calc non Af Amer: 72 mL/min/{1.73_m2} (ref >59)
Glucose: 177 mg/dL — ABNORMAL HIGH (ref 65–99)
Potassium: 4.8 mmol/L (ref 3.5–5.2)
Sodium: 139 mmol/L (ref 134–144)

## 2019-09-10 LAB — CBC
Hematocrit: 41.8 % (ref 34.0–46.6)
Hemoglobin: 14.4 g/dL (ref 11.1–15.9)
MCH: 32.5 pg (ref 26.6–33.0)
MCHC: 34.4 g/dL (ref 31.5–35.7)
MCV: 94 fL (ref 79–97)
Platelets: 247 10*3/uL (ref 150–450)
RBC: 4.43 x10E6/uL (ref 3.77–5.28)
RDW: 11.8 % (ref 11.7–15.4)
WBC: 7.4 10*3/uL (ref 3.4–10.8)

## 2019-09-10 MED ORDER — ATORVASTATIN CALCIUM 40 MG PO TABS: 40.0000 mg | ORAL_TABLET | Freq: Every day | ORAL | 1 refills | Status: DC

## 2019-09-10 NOTE — Progress Notes (Signed)
Cardiology Office Note   Date:  09/10/2019   ID:  [name], DOB [birth year], MRN [id number]  PCP:  [name], PA-C  Cardiologist: Dr. Oval Linsey  No chief complaint on file.     History of Present Illness: Julie Prince is a 59 y.o. female who is here today for follow-up visit regarding peripheral arterial disease.   She has history of inappropriate sinus tachycardia, diabetes and hyperlipidemia. She is a previous smoker and quit more than 20 years ago. She has known history of peripheral arterial disease. Angiography in March of last year showed no significant aortoiliac disease, on the left, there was flush occlusion of the SFA with reconstitution via extensive collaterals from the profunda with two-vessel runoff below the knee.  On the right side, there was diffuse disease in the proximal portion of the SFA with total occlusion in the midsegment with reconstitution distally via extensive collaterals and one-vessel runoff below the knee.  She was started on small dose cilostazol.  At that time, she had no severe claudication or lower extremity ulceration and thus medical therapy was recommended. However, over the last month, she developed a small ulceration on the lateral side of the left small toe.  She has been attending at the wound clinic and this has been very slow to heal.  She has some mild discomfort in her legs with walking.  In addition, there is increased swelling in the left foot.   Past Medical History:  Diagnosis Date  . Allergy   . Barrett esophagus   . Cataract    left cataract removal  . Essential hypertension, benign 09/12/2013  . GERD (gastroesophageal reflux disease)   . Hyperlipidemia 09/12/2013  . 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 Type 2 diabetes mellitus ([place]) 09/12/2013   Dr. Posey Pronto at [place]     Past Surgical History:  Procedure Laterality Date  . ABDOMINAL AORTOGRAM W/LOWER EXTREMITY N/A 06/06/2018   Procedure:  ABDOMINAL AORTOGRAM W/LOWER EXTREMITY;  Surgeon: Wellington Hampshire, MD;  Location: [facility];  Service: Cardiovascular;  Laterality: N/A;  . APPENDECTOMY    . CATARACT EXTRACTION Left   . CESAREAN SECTION  1990  . COLONOSCOPY    . right tube and ovary removed    . WISDOM TOOTH EXTRACTION       Current Outpatient Medications  Medication Sig Dispense Refill  . aspirin 81 MG chewable tablet Chew by mouth daily.    Marland Kitchen atorvastatin (LIPITOR) 40 MG tablet Take 1 tablet (40 mg total) by mouth at bedtime. 90 tablet 1  . cilostazol (PLETAL) 50 MG tablet TAKE 1 TABLET BY MOUTH TWICE A DAY (Patient taking differently: Take 50 mg by mouth 2 (two) times daily. ) 180 tablet 1  . Continuous Blood Gluc Sensor (FREESTYLE LIBRE 14 DAY SENSOR) MISC 1 each by Other route See admin instructions. Test daily as directed and change every 14 days.    . metoprolol tartrate (LOPRESSOR) 50 MG tablet Take 1.5 tablets (75 mg total) by mouth 2 (two) times daily. (Patient taking differently: Take 50 mg by mouth 2 (two) times daily. ) 270 tablet 1  . Semaglutide, 1 MG/DOSE, (OZEMPIC, 1 MG/DOSE,) 2 MG/1.5ML SOPN Inject 1 mg into the skin once a week. 2 pen 0  . TRESIBA FLEXTOUCH 100 UNIT/ML SOPN FlexTouch Pen INJECT 50 UNITS SUBCUTANEOUS AT BEDTIME (Patient taking differently: Inject 50 Units into the skin at bedtime. ) 15 pen 5  . losartan (COZAAR) 50 MG tablet Take 1  tablet (50 mg total) by mouth daily. (Patient not taking: Reported on 08/14/2019) 90 tablet 3   Current Facility-Administered Medications  Medication Dose Route Frequency Provider Last Rate Last Admin  . 0.9 %  sodium chloride infusion  500 mL Intravenous Once Pyrtle, Lajuan Lines, MD        Allergies:   Penicillins and Metformin and related    Social History:  The patient  reports that she has quit smoking. She smoked 0.50 packs per day. She has never used smokeless tobacco. She reports that she does not drink alcohol and does not use drugs.   Family  History:  The patient's family history includes Alzheimer's disease in her maternal grandmother; Fibromyalgia in her sister; Heart disease in her maternal aunt; Lung cancer in her mother; Stroke in her father.    ROS:  Please see the history of present illness.   Otherwise, review of systems are positive for none.   All other systems are reviewed and negative.    PHYSICAL EXAM: VS:  BP 138/84   Pulse (!) 113   Ht 5\' 4"  (1.626 m)   Wt 213 lb 6.4 oz (96.8 kg)   SpO2 99%   BMI 36.63 kg/m  , BMI Body mass index is 36.63 kg/m. GEN: Well nourished, well developed, in no acute distress  HEENT: normal  Neck: no JVD, carotid bruits, or masses Cardiac: RRR; no murmurs, rubs, or gallops,no edema  Respiratory:  clear to auscultation bilaterally, normal work of breathing GI: soft, nontender, nondistended, + BS MS: no deformity or atrophy  Skin: warm and dry, no rash Neuro:  Strength and sensation are intact Psych: euthymic mood, full affect Vascular: Distal pulses are not palpable.  There is a 4 mm ulceration on the left lateral small toe.   EKG:  EKG is ordered today. EKG showed sinus tachycardia with first-degree AV block.  Poor R wave progression in the anterior leads. Recent Labs: 12/26/2018: TSH 1.790 08/21/2019: ALT 11; BUN 11; Creatinine, Ser 1.00; Hemoglobin 13.4; Platelets 317; Potassium 5.1; Sodium 137    Lipid Panel    Component Value Date/Time   CHOL 208 (H) 12/26/2018 0941   TRIG 95 12/26/2018 0941   HDL 46 12/26/2018 0941   CHOLHDL 4.5 (H) 12/26/2018 0941   CHOLHDL 6.4 (H) 04/26/2016 1412   VLDL 32 (H) 04/26/2016 1412   LDLCALC 145 (H) 12/26/2018 0941      Wt Readings from Last 3 Encounters:  09/10/19 213 lb 6.4 oz (96.8 kg)  08/21/19 216 lb 0.8 oz (98 kg)  08/16/19 215 lb (97.5 kg)        PAD Screen 05/22/2018  Previous PAD dx? No  Previous surgical procedure? No  Pain with walking? Yes  Subsides with rest? No  Feet/toe relief with dangling? No  Painful,  non-healing ulcers? No  Extremities discolored? No      ASSESSMENT AND PLAN:  1.  Peripheral arterial disease: Now with critical limb ischemia with nonhealing ulceration on the left lateral small toe.  This is a limb threatening situation.  Due to this, I recommend proceeding with abdominal aortogram with left lower extremity angiography and possible endovascular intervention.  I discussed the procedure in details as well as risks and benefits.  I reviewed the angiogram with her and her daughter.  She has flush occlusion of the left SFA.  Endovascular intervention will likely require retrograde pedal access.  The other option would be femoral-popliteal bypass.  2.  Essential hypertension: Continue metoprolol and  losartan  3.  Hyperlipidemia: Continue treatment with atorvastatin with a target LDL of less than 70.    I refilled atorvastatin.  4.  Diabetes mellitus: Most recent hemoglobin A1c improved significantly to 5.8 from 10.  5.  Inappropriate sinus tachycardia versus ectopic atrial tachycardia: Continue metoprolol.   Disposition:   FU with me in 1 month  Signed,  [name], MD  09/10/2019 11:30 AM    [place]

## 2019-09-10 NOTE — H&P (View-Only) (Signed)
Cardiology Office Note   Date:  09/10/2019   ID:  Julie Prince, DOB Nov 08, 1960, MRN [id number]  PCP:  [name], PA-C  Cardiologist: Dr. Oval Linsey  No chief complaint on file.     History of Present Illness: Julie Prince is a 58 y.o. female who is here today for follow-up visit regarding peripheral arterial disease.   She has history of inappropriate sinus tachycardia, diabetes and hyperlipidemia. She is a previous smoker and quit more than 20 years ago. She has known history of peripheral arterial disease. Angiography in March of last year showed no significant aortoiliac disease, on the left, there was flush occlusion of the SFA with reconstitution via extensive collaterals from the profunda with two-vessel runoff below the knee.  On the right side, there was diffuse disease in the proximal portion of the SFA with total occlusion in the midsegment with reconstitution distally via extensive collaterals and one-vessel runoff below the knee.  She was started on small dose cilostazol.  At that time, she had no severe claudication or lower extremity ulceration and thus medical therapy was recommended. However, over the last month, she developed a small ulceration on the lateral side of the left small toe.  She has been attending at the wound clinic and this has been very slow to heal.  She has some mild discomfort in her legs with walking.  In addition, there is increased swelling in the left foot.   Past Medical History:  Diagnosis Date  . Allergy   . Barrett esophagus   . Cataract    left cataract removal  . Essential hypertension, benign 09/12/2013  . GERD (gastroesophageal reflux disease)   . Hyperlipidemia 09/12/2013  . 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 Type 2 diabetes mellitus ([place]) 09/12/2013   Dr. Posey Pronto at [place]     Past Surgical History:  Procedure Laterality Date  . ABDOMINAL AORTOGRAM W/LOWER EXTREMITY N/A 06/06/2018   Procedure:  ABDOMINAL AORTOGRAM W/LOWER EXTREMITY;  Surgeon: Wellington Hampshire, MD;  Location: [facility];  Service: Cardiovascular;  Laterality: N/A;  . APPENDECTOMY    . CATARACT EXTRACTION Left   . CESAREAN SECTION  1990  . COLONOSCOPY    . right tube and ovary removed    . WISDOM TOOTH EXTRACTION       Current Outpatient Medications  Medication Sig Dispense Refill  . aspirin 81 MG chewable tablet Chew by mouth daily.    Marland Kitchen atorvastatin (LIPITOR) 40 MG tablet Take 1 tablet (40 mg total) by mouth at bedtime. 90 tablet 1  . cilostazol (PLETAL) 50 MG tablet TAKE 1 TABLET BY MOUTH TWICE A DAY (Patient taking differently: Take 50 mg by mouth 2 (two) times daily. ) 180 tablet 1  . Continuous Blood Gluc Sensor (FREESTYLE LIBRE 14 DAY SENSOR) MISC 1 each by Other route See admin instructions. Test daily as directed and change every 14 days.    . metoprolol tartrate (LOPRESSOR) 50 MG tablet Take 1.5 tablets (75 mg total) by mouth 2 (two) times daily. (Patient taking differently: Take 50 mg by mouth 2 (two) times daily. ) 270 tablet 1  . Semaglutide, 1 MG/DOSE, (OZEMPIC, 1 MG/DOSE,) 2 MG/1.5ML SOPN Inject 1 mg into the skin once a week. 2 pen 0  . TRESIBA FLEXTOUCH 100 UNIT/ML SOPN FlexTouch Pen INJECT 50 UNITS SUBCUTANEOUS AT BEDTIME (Patient taking differently: Inject 50 Units into the skin at bedtime. ) 15 pen 5  . losartan (COZAAR) 50 MG tablet Take 1  tablet (50 mg total) by mouth daily. (Patient not taking: Reported on 08/14/2019) 90 tablet 3   Current Facility-Administered Medications  Medication Dose Route Frequency Provider Last Rate Last Admin  . 0.9 %  sodium chloride infusion  500 mL Intravenous Once Pyrtle, Lajuan Lines, MD        Allergies:   Penicillins and Metformin and related    Social History:  The patient  reports that she has quit smoking. She smoked 0.50 packs per day. She has never used smokeless tobacco. She reports that she does not drink alcohol and does not use drugs.   Family  History:  The patient's family history includes Alzheimer's disease in her maternal grandmother; Fibromyalgia in her sister; Heart disease in her maternal aunt; Lung cancer in her mother; Stroke in her father.    ROS:  Please see the history of present illness.   Otherwise, review of systems are positive for none.   All other systems are reviewed and negative.    PHYSICAL EXAM: VS:  BP 138/84   Pulse (!) 113   Ht 5\' 4"  (1.626 m)   Wt 213 lb 6.4 oz (96.8 kg)   SpO2 99%   BMI 36.63 kg/m  , BMI Body mass index is 36.63 kg/m. GEN: Well nourished, well developed, in no acute distress  HEENT: normal  Neck: no JVD, carotid bruits, or masses Cardiac: RRR; no murmurs, rubs, or gallops,no edema  Respiratory:  clear to auscultation bilaterally, normal work of breathing GI: soft, nontender, nondistended, + BS MS: no deformity or atrophy  Skin: warm and dry, no rash Neuro:  Strength and sensation are intact Psych: euthymic mood, full affect Vascular: Distal pulses are not palpable.  There is a 4 mm ulceration on the left lateral small toe.   EKG:  EKG is ordered today. EKG showed sinus tachycardia with first-degree AV block.  Poor R wave progression in the anterior leads. Recent Labs: 12/26/2018: TSH 1.790 08/21/2019: ALT 11; BUN 11; Creatinine, Ser 1.00; Hemoglobin 13.4; Platelets 317; Potassium 5.1; Sodium 137    Lipid Panel    Component Value Date/Time   CHOL 208 (H) 12/26/2018 0941   TRIG 95 12/26/2018 0941   HDL 46 12/26/2018 0941   CHOLHDL 4.5 (H) 12/26/2018 0941   CHOLHDL 6.4 (H) 04/26/2016 1412   VLDL 32 (H) 04/26/2016 1412   LDLCALC 145 (H) 12/26/2018 0941      Wt Readings from Last 3 Encounters:  09/10/19 213 lb 6.4 oz (96.8 kg)  08/21/19 216 lb 0.8 oz (98 kg)  08/16/19 215 lb (97.5 kg)        PAD Screen 05/22/2018  Previous PAD dx? No  Previous surgical procedure? No  Pain with walking? Yes  Subsides with rest? No  Feet/toe relief with dangling? No  Painful,  non-healing ulcers? No  Extremities discolored? No      ASSESSMENT AND PLAN:  1.  Peripheral arterial disease: Now with critical limb ischemia with nonhealing ulceration on the left lateral small toe.  This is a limb threatening situation.  Due to this, I recommend proceeding with abdominal aortogram with left lower extremity angiography and possible endovascular intervention.  I discussed the procedure in details as well as risks and benefits.  I reviewed the angiogram with her and her daughter.  She has flush occlusion of the left SFA.  Endovascular intervention will likely require retrograde pedal access.  The other option would be femoral-popliteal bypass.  2.  Essential hypertension: Continue metoprolol and  losartan  3.  Hyperlipidemia: Continue treatment with atorvastatin with a target LDL of less than 70.    I refilled atorvastatin.  4.  Diabetes mellitus: Most recent hemoglobin A1c improved significantly to 5.8 from 10.  5.  Inappropriate sinus tachycardia versus ectopic atrial tachycardia: Continue metoprolol.   Disposition:   FU with me in 1 month  Signed,  [name], MD  09/10/2019 11:30 AM    [place]

## 2019-09-10 NOTE — Patient Instructions (Signed)
Medication Instructions:  No changes *If you need a refill on your cardiac medications before your next appointment, please call your pharmacy*   Lab Work: None ordered If you have labs (blood work) drawn today and your tests are completely normal, you will receive your results only by: Marland Kitchen MyChart Message (if you have MyChart) OR . A paper copy in the mail If you have any lab test that is abnormal or we need to change your treatment, we will call you to review the results.   Testing/Procedures: Your physician has requested that you have a peripheral vascular angiogram. This exam is performed at the hospital. During this exam IV contrast is used to look at arterial blood flow. Please review the information sheet given for details.   Follow-Up: At [facility], you and your health needs are our priority.  As part of our continuing mission to provide you with exceptional heart care, we have created designated Provider Care Teams.  These Care Teams include your primary Cardiologist (physician) and Advanced Practice Providers (APPs -  Physician Assistants and Nurse Practitioners) who all work together to provide you with the care you need, when you need it.  We recommend signing up for the patient portal called "MyChart".  Sign up information is provided on this After Visit Summary.  MyChart is used to connect with patients for Virtual Visits (Telemedicine).  Patients are able to view lab/test results, encounter notes, upcoming appointments, etc.  Non-urgent messages can be sent to your provider as well.   To learn more about what you can do with MyChart, go to [email or web address].    Your next appointment:   Keep your post procedure follow up on 7/27 at 10:20 with Dr. Fletcher Anon  Other Instructions    [facility] CARDIOVASCULAR DIVISION [facility] [place] [address] Dept: [place]: [place]  09/10/2019  You are scheduled for a Peripheral Angiogram on Wednesday, June 30 with Dr. [name].  1. Please arrive at the [facility] (Main Entrance A) at [facility]: [address] [place], [place] [address] at 8:30 AM (This time is two hours before your procedure to ensure your preparation). Free valet parking service is available.   Special note: Every effort is made to have your procedure done on time. Please understand that emergencies sometimes delay scheduled procedures.  2. Diet: Do not eat solid foods after midnight.  The patient may have clear liquids until 5am upon the day of the procedure.  3. Labs: You will need to have blood drawn today   4. Medication instructions in preparation for your procedure: Hold all diabetic medication the morning of the procedure TAKE half the dose of the [place] the night before the procedure HOLD the Semaglutide the morning of the procedure.  On the morning of your procedure, take your Aspirin and any morning medicines NOT listed above.  You may use sips of water.  5. Plan for one night stay--bring personal belongings. 6. Bring a current list of your medications and current insurance cards. 7. You MUST have a responsible person to drive you home. 8. Someone MUST be with you the first 24 hours after you arrive home or your discharge will be delayed. 9. Please wear clothes that are easy to get on and off and wear slip-on shoes.  Thank you for allowing [name] to care for you!   -- [facility]

## 2019-09-11 ENCOUNTER — Telehealth: Payer: Self-pay | Admitting: Cardiovascular Disease

## 2019-09-11 ENCOUNTER — Encounter: Payer: 59 | Admitting: Internal Medicine

## 2019-09-11 ENCOUNTER — Ambulatory Visit: Payer: Self-pay | Admitting: Podiatry

## 2019-09-11 DIAGNOSIS — E11621 Type 2 diabetes mellitus with foot ulcer: Secondary | ICD-10-CM | POA: Diagnosis not present

## 2019-09-11 NOTE — Progress Notes (Signed)
[place], [place] ([id number]) Visit Report for 09/11/2019 HPI Details Patient Name: Julie Prince, Julie Prince Date of Service: 09/11/2019 8:00 AM Medical Record Number: [id number] Patient Account Number: [id number] Date of Birth/Sex: 03-04-1961 (59 y.o. F) Treating RN: Cornell Barman Primary Care Provider: [name] Other Clinician: Referring Provider: [name] Treating Provider/Extender: Ricard Dillon Weeks in Treatment: 3 History of Present Illness HPI Description: Resulted: 06/06/18 1152 Order Status: Completed Updated: 06/06/18 1201 Narrative: o 1. oNo significant aortoiliac disease. 2. oLeft lower extremity: Flush occlusion of the SFA with reconstitution via extensive collaterals from the profunda distally. oTwo-vessel runoff below the knee via the anterior and posterior tibial arteries. 3. oRight lower extremity: Diffuse SFA disease proximally followed by a total occlusion in the midsegment with reconstitution distally via extensive collaterals from the profunda. oOne-vessel runoff below the knee via the posterior tibial artery. Recommendations: Recommend maximizing medical therapy as the patient has good collaterals. o I am going to start her on small dose cilostazol for symptomatic improvement. ADMISSION 08/21/2019 This is a 59 year old woman with type 2 diabetes and known PAD. She had an angiogram in March 2020 by Dr. Fletcher Anon the results of which are shown above. At that time she had been seeing Dr. Amalia Hailey of podiatry with pain and swelling in her left foot she was felt to have capsulitis. She has been using a cam boot. She came to the attention of Dr. Velva Harman in March 2020 after noninvasive studies done on 05/09/2018 showed an ABI on the right of 0.63 with a TBI of 0.21 biphasic waveforms on the left 0.98 ABI with a TBI of 0.16 monophasic waveforms. Her ABIs were felt to be falsely elevated. She went on to have angiography on 06/06/2018. The results of this are above. She was felt to  have good collaterals at that time she did not have any wounds. She had an MRI of the foot and 09/13/2018 that showed marrow edema in the distal calcaneus throughout the midfoot and in the visualized portion of the second and third metatarsals likely due to stress changeo Plantar fasciitis. No fracture or infection The patient states that she has had a wound on the left second toe/callus over the last month. She was seen in the ER and on 2 occasions in late May. On the first she had had cephalexin and doxycycline. She was not having any improvement in the erythema on the dorsal foot which per her description went up to the left medial ankle. On 5/28 she was started on Bactrim which she is in the middle of taking. She has had good improvement in the degree of erythema. Also 2 weeks ago she had a pedicure and she had some bleeding from the first and second toes which became discolored. This actually looks somewhat better than the picture from the ER on 5/28. Past medical history includes type 2 diabetes with known PAD 2. Barrett's esophagus 3. Hypertension 4. Hyperlipidemia ABIs in our clinic were 0.74 on the right and 0.86 on the left. She does not really describe claudication that I can elicit. She is still fairly active 6/9; patient comes back to clinic today for follow-up of the ulcer over her left fifth PIP on the left foot. Culture I did last week showed methicillin sensitive staph aureus which should have been sensitive to the Bactrim DS she was already put on by her primary physician. Noteworthy that she did not tolerate the Bactrim DS very well because of nausea and vomiting. She is allergic to penicillin. She is  using silver alginate on the wound. For 1 reason or another we were not able to arrange a follow-up with Dr. Fletcher Anon to follow-up on her PAD. We will retry to resend the information 6/23; the patient has apparently seen Dr. Fletcher Anon yesterday and has an angiogram booked for next Wednesday.  The ulcer on the dorsal aspect of her left fifth toe looks better less of a purulent surface. Still not completely viable. She cultured MSSA Electronic Signature(s) Signed: 09/11/2019 3:45:53 PM By: Linton Ham MD Entered By: Linton Ham on 09/11/2019 08:24:21 [place], Julie Prince ([id number]) -------------------------------------------------------------------------------- Physical Exam Details Patient Name: Julie Prince Date of Service: 09/11/2019 8:00 AM Medical Record Number: [id number] Patient Account Number: [id number] Date of Birth/Sex: 26-Jul-1960 (59 y.o. F) Treating RN: Cornell Barman Primary Care Provider: [name] Other Clinician: Referring Provider: [name] Treating Provider/Extender: Tito Dine in Treatment: 3 Cardiovascular No palpable pulses are felt in the left foot however this is not a change. Musculoskeletal No palpable tenderness over the MTP, PIP or DIP of the left fifth. Notes Wound exam; the area in question is in the dorsal aspect of left fifth toe at the level of the PIP. She is still does not have a completely viable wound surface however this is a lot less purulent looking than the last time I saw her. There is no evidence of infection superficial deeply or involvement of the underlying joint Electronic Signature(s) Signed: 09/11/2019 3:45:53 PM By: Linton Ham MD Entered By: Linton Ham on 09/11/2019 10:17:51 [place], Julie Prince ([id number]) -------------------------------------------------------------------------------- Physician Orders Details Patient Name: Julie Prince Date of Service: 09/11/2019 8:00 AM Medical Record Number: [id number] Patient Account Number: [id number] Date of Birth/Sex: 07-Nov-1960 (58 y.o. F) Treating RN: Cornell Barman Primary Care Provider: [name] Other Clinician: Referring Provider: [name] Treating Provider/Extender: Tito Dine in Treatment: 3 Verbal / Phone Orders:  No Diagnosis Coding Wound Cleansing Wound #1 Left Toe Fifth o Clean wound with Normal Saline. o Dial antibacterial soap, wash wounds, rinse and pat dry prior to dressing wounds o No tub bath. Anesthetic (add to Medication List) Wound #1 Left Toe Fifth o Topical Lidocaine 4% cream applied to wound bed prior to debridement (In Clinic Only). Primary Wound Dressing Wound #1 Left Toe Fifth o Silver Alginate - thin layer of bacitracin to wound bed Secondary Dressing Wound #1 Left Toe Fifth o Gauze and Kerlix/Conform Dressing Change Frequency Wound #1 Left Toe Fifth o Change dressing every other day. Follow-up Appointments Wound #1 Left Toe Fifth o Return Appointment in 2 weeks. Off-Loading Wound #1 Left Toe Fifth o Other: Additional Orders / Instructions Wound #1 Left Toe Fifth o Increase protein intake. Electronic Signature(s) Signed: 09/11/2019 3:45:53 PM By: Linton Ham MD Signed: 09/11/2019 3:55:21 PM By: Gretta Cool BSN, RN, CWS, [name] RN, BSN Entered By: Gretta Cool, BSN, RN, CWS, [name] on 09/11/2019 08:19:40 [place], Julie Prince ([id number]) -------------------------------------------------------------------------------- Problem List Details Patient Name: Julie Prince Date of Service: 09/11/2019 8:00 AM Medical Record Number: [id number] Patient Account Number: [id number] Date of Birth/Sex: 08/17/1960 (58 y.o. F) Treating RN: Cornell Barman Primary Care Provider: [name] Other Clinician: Referring Provider: [name] Treating Provider/Extender: Tito Dine in Treatment: 3 Active Problems ICD-10 Encounter Code Description Active Date MDM Diagnosis E11.621 Type 2 diabetes mellitus with foot ulcer 08/21/2019 No Yes L97.528 Non-pressure chronic ulcer of other part of left foot with other specified 08/21/2019 No Yes severity E11.51 Type 2 diabetes mellitus with diabetic peripheral angiopathy without 08/21/2019 No Yes gangrene L03.116 Cellulitis of  left  lower limb 08/21/2019 No Yes A49.01 Methicillin susceptible Staphylococcus aureus infection, unspecified site 08/28/2019 No Yes Inactive Problems Resolved Problems Electronic Signature(s) Signed: 09/11/2019 3:45:53 PM By: Linton Ham MD Entered By: Linton Ham on 09/11/2019 08:23:03 [place], [place] ([id number]) -------------------------------------------------------------------------------- Progress Note Details Patient Name: Julie Prince Date of Service: 09/11/2019 8:00 AM Medical Record Number: [id number] Patient Account Number: [id number] Date of Birth/Sex: May 17, 1960 (59 y.o. F) Treating RN: Cornell Barman Primary Care Provider: [name] Other Clinician: Referring Provider: [name] Treating Provider/Extender: Ricard Dillon Weeks in Treatment: 3 Subjective History of Present Illness (HPI) Resulted: 06/06/18 1152 Order Status: Completed Updated: 06/06/18 1201 Narrative: 1. No significant aortoiliac disease. 2. Left lower extremity: Flush occlusion of the SFA with reconstitution via extensive collaterals from the profunda distally. Two-vessel runoff below the knee via the anterior and posterior tibial arteries. 3. 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 One-vessel runoff below the knee via the posterior tibial artery. Recommendations: Recommend maximizing medical therapy as the patient has good collaterals. I am going to start her on small dose cilostazol for symptomatic improvement. ADMISSION 08/21/2019 This is a 59 year old woman with type 2 diabetes and known PAD. She had an angiogram in March 2020 by Dr. Fletcher Anon the results of which are shown above. At that time she had been seeing Dr. Amalia Hailey of podiatry with pain and swelling in her left foot she was felt to have capsulitis. She has been using a cam boot. She came to the  attention of Dr. Velva Harman in March 2020 after noninvasive studies done on 05/09/2018 showed an ABI on the right of 0.63 with a TBI of 0.21 biphasic waveforms on the left 0.98 ABI with a TBI of 0.16 monophasic waveforms. Her ABIs were felt to be falsely elevated. She went on to have angiography on 06/06/2018. The results of this are above. She was felt to have good collaterals at that time she did not have any wounds. She had an MRI of the foot and 09/13/2018 that showed marrow edema in the distal calcaneus throughout the midfoot and in the visualized portion of the second and third metatarsals likely due to stress changeo Plantar fasciitis. No fracture or infection The patient states that she has had a wound on the left second toe/callus over the last month. She was seen in the ER and on 2 occasions in late May. On the first she had had cephalexin and doxycycline. She was not having any improvement in the erythema on the dorsal foot which per her description went up to the left medial ankle. On 5/28 she was started on Bactrim which she is in the middle of taking. She has had good improvement in the degree of erythema. Also 2 weeks ago she had a pedicure and she had some bleeding from the first and second toes which became discolored. This actually looks somewhat better than the picture from the ER on 5/28. Past medical history includes type 2 diabetes with known PAD 2. Barrett's esophagus 3. Hypertension 4. Hyperlipidemia ABIs in our clinic were 0.74 on the right and 0.86 on the left. She does not really describe claudication that I can elicit. She is still fairly active 6/9; patient comes back to clinic today for follow-up of the ulcer over her left fifth PIP on the left foot. Culture I did last week showed methicillin sensitive staph aureus which should have been sensitive to  the Bactrim DS she was already put on by her primary physician. Noteworthy that she did not tolerate the Bactrim DS very well  because of nausea and vomiting. She is allergic to penicillin. She is using silver alginate on the wound. For 1 reason or another we were not able to arrange a follow-up with Dr. Fletcher Anon to follow-up on her PAD. We will retry to resend the information 6/23; the patient has apparently seen Dr. Fletcher Anon yesterday and has an angiogram booked for next Wednesday. The ulcer on the dorsal aspect of her left fifth toe looks better less of a purulent surface. Still not completely viable. She cultured MSSA Objective Constitutional Vitals Time Taken: 8:07 AM, Height: 64 in, Weight: 210 lbs, BMI: 36, Temperature: 98.3 F, Pulse: 87 bpm, Respiratory Rate: 16 breaths/min, Blood Pressure: 142/72 mmHg. [place], [place] ([id number]) Cardiovascular No palpable pulses are felt in the left foot however this is not a change. Musculoskeletal No palpable tenderness over the MTP, PIP or DIP of the left fifth. General Notes: Wound exam; the area in question is in the dorsal aspect of left fifth toe at the level of the PIP. She is still does not have a completely viable wound surface however this is a lot less purulent looking than the last time I saw her. There is no evidence of infection superficial deeply or involvement of the underlying joint Integumentary (Hair, Skin) Wound #1 status is Open. Original cause of wound was Blister. The wound is located on the Left Toe Fifth. The wound measures 0.6cm length x 0.7cm width x 0.2cm depth; 0.33cm^2 area and 0.066cm^3 volume. There is Fat Layer (Subcutaneous Tissue) Exposed exposed. There is a medium amount of serous drainage noted. The wound margin is flat and intact. There is medium (34-66%) pink granulation within the wound bed. There is a medium (34-66%) amount of necrotic tissue within the wound bed including Adherent Slough. Assessment Active Problems ICD-10 Type 2 diabetes mellitus with foot ulcer Non-pressure chronic ulcer of other part of left foot with other specified  severity Type 2 diabetes mellitus with diabetic peripheral angiopathy without gangrene Cellulitis of left lower limb Methicillin susceptible Staphylococcus aureus infection, unspecified site Plan Wound Cleansing: Wound #1 Left Toe Fifth: Clean wound with Normal Saline. Dial antibacterial soap, wash wounds, rinse and pat dry prior to dressing wounds No tub bath. Anesthetic (add to Medication List): Wound #1 Left Toe Fifth: Topical Lidocaine 4% cream applied to wound bed prior to debridement (In Clinic Only). Primary Wound Dressing: Wound #1 Left Toe Fifth: Silver Alginate - thin layer of bacitracin to wound bed Secondary Dressing: Wound #1 Left Toe Fifth: Gauze and Kerlix/Conform Dressing Change Frequency: Wound #1 Left Toe Fifth: Change dressing every other day. Follow-up Appointments: Wound #1 Left Toe Fifth: Return Appointment in 2 weeks. Off-Loading: Wound #1 Left Toe Fifth: Other: Additional Orders / Instructions: Wound #1 Left Toe Fifth: Increase protein intake. 1. The wound surface still looks a lot better than when I saw this last time at which time there was still purulence and still requiring debridement of necrotic material. Today the wound is not 100% granulated however we will wait until we have vascular studies and possible interventions before doing further debridement to the wound surface 2. She completed the Levaquin last week and tolerated it well. This was for the underlying MSSA 3. Sees Dr. Fletcher Anon of her angiography next week. I will review his note 4. I see no evidence of anything that requires ongoing systemic antibiotics. I did  however recommend top continuing with the topical Bactroban under silver alginate [place], Julie Prince ([id number]) Electronic Signature(s) Signed: 09/11/2019 3:45:53 PM By: Linton Ham MD Entered By: Linton Ham on 09/11/2019 08:29:32 [place], Julie Prince  ([id number]) -------------------------------------------------------------------------------- SuperBill Details Patient Name: Julie Prince Date of Service: 09/11/2019 Medical Record Number: [id number] Patient Account Number: [id number] Date of Birth/Sex: 1960-06-10 (58 y.o. F) Treating RN: Cornell Barman Primary Care Provider: [name] Other Clinician: Referring Provider: [name] Treating Provider/Extender: Tito Dine in Treatment: 3 Diagnosis Coding ICD-10 Codes Code Description E11.621 Type 2 diabetes mellitus with foot ulcer L97.528 Non-pressure chronic ulcer of other part of left foot with other specified severity E11.51 Type 2 diabetes mellitus with diabetic peripheral angiopathy without gangrene L03.116 Cellulitis of left lower limb A49.01 Methicillin susceptible Staphylococcus aureus infection, unspecified site Facility Procedures CPT4 Code: [id number] Description: 08676 - WOUND CARE VISIT-LEV 3 EST PT Modifier: Quantity: 1 Physician Procedures CPT4 Code: 1950932 Description: 67124 - WC PHYS LEVEL 4 - EST PT Modifier: Quantity: 1 CPT4 Code: Description: ICD-10 Diagnosis Description L97.528 Non-pressure chronic ulcer of other part of left foot with other specifie A49.01 Methicillin susceptible Staphylococcus aureus infection, unspecified site E11.51 Type 2 diabetes mellitus with diabetic  peripheral angiopathy without gang Modifier: d severity rene Quantity: Electronic Signature(s) Signed: 09/11/2019 3:45:53 PM By: Linton Ham MD Entered By: Linton Ham on 09/11/2019 08:31:37

## 2019-09-11 NOTE — Progress Notes (Signed)
[place], [place] ([id number]) Visit Report for 09/11/2019 Arrival Information Details Patient Name: Julie, Prince Date of Service: 09/11/2019 8:00 AM Medical Record Number: [id number] Patient Account Number: [id number] Date of Birth/Sex: 1960-07-01 (59 y.o. F) Treating RN: Army Melia Primary Care [name]: [name] Other Clinician: Referring [name]: [name] Treating Romonia Yanik/Extender: Tito Dine in Treatment: 3 Visit Information History Since Last Visit Added or deleted any medications: No Patient Arrived: Ambulatory Any new allergies or adverse reactions: No Arrival Time: 08:06 Had a fall or experienced change in No Accompanied By: family activities of daily living that may affect Transfer Assistance: None risk of falls: Patient Identification Verified: Yes Signs or symptoms of abuse/neglect since last visito No Patient Has Alerts: Yes Hospitalized since last visit: No Patient Alerts: DMII Has Dressing in Place as Prescribed: Yes Pain Present Now: No Electronic Signature(s) Signed: 09/11/2019 10:25:16 AM By: Army Melia Entered By: Army Melia on 09/11/2019 08:07:07 Prince, Julie ([id number]) -------------------------------------------------------------------------------- Clinic Level of Care Assessment Details Patient Name: Julie Prince Date of Service: 09/11/2019 8:00 AM Medical Record Number: [id number] Patient Account Number: [id number] Date of Birth/Sex: May 30, 1960 (59 y.o. F) Treating RN: Cornell Barman Primary Care Sabrena Gavitt: [name] Other Clinician: Referring Lashaye Fisk: [name] Treating Chinmay Squier/Extender: Tito Dine in Treatment: 3 Clinic Level of Care Assessment Items TOOL 4 Quantity Score []  - Use when only an EandM is performed on FOLLOW-UP visit 0 ASSESSMENTS - Nursing Assessment / Reassessment X - Reassessment of Co-morbidities (includes updates in patient status) 1 10 X- 1 5 Reassessment of Adherence  to Treatment Plan ASSESSMENTS - Wound and Skin Assessment / Reassessment X - Simple Wound Assessment / Reassessment - one wound 1 5 []  - 0 Complex Wound Assessment / Reassessment - multiple wounds []  - 0 Dermatologic / Skin Assessment (not related to wound area) ASSESSMENTS - Focused Assessment []  - Circumferential Edema Measurements - multi extremities 0 []  - 0 Nutritional Assessment / Counseling / Intervention []  - 0 Lower Extremity Assessment (monofilament, tuning fork, pulses) []  - 0 Peripheral Arterial Disease Assessment (using hand held doppler) ASSESSMENTS - Ostomy and/or Continence Assessment and Care []  - Incontinence Assessment and Management 0 []  - 0 Ostomy Care Assessment and Management (repouching, etc.) PROCESS - Coordination of Care X - Simple Patient / Family Education for ongoing care 1 15 []  - 0 Complex (extensive) Patient / Family Education for ongoing care []  - 0 Staff obtains Programmer, systems, Records, Test Results / Process Orders []  - 0 Staff telephones HHA, Nursing Homes / Clarify orders / etc []  - 0 Routine Transfer to another Facility (non-emergent condition) []  - 0 Routine Hospital Admission (non-emergent condition) []  - 0 New Admissions / Biomedical engineer / Ordering NPWT, Apligraf, etc. []  - 0 Emergency Hospital Admission (emergent condition) X- 1 10 Simple Discharge Coordination []  - 0 Complex (extensive) Discharge Coordination PROCESS - Special Needs []  - Pediatric / Minor Patient Management 0 []  - 0 Isolation Patient Management []  - 0 Hearing / Language / Visual special needs []  - 0 Assessment of Community assistance (transportation, D/C planning, etc.) []  - 0 Additional assistance / Altered mentation []  - 0 Support Surface(s) Assessment (bed, cushion, seat, etc.) INTERVENTIONS - Wound Cleansing / Measurement Prince, Julie ([id number]) X- 1 5 Simple Wound Cleansing - one wound []  - 0 Complex Wound Cleansing - multiple wounds X- 1  5 Wound Imaging (photographs - any number of wounds) []  - 0 Wound Tracing (instead of photographs) X- 1 5 Simple Wound Measurement - one wound []  -  0 Complex Wound Measurement - multiple wounds INTERVENTIONS - Wound Dressings []  - Small Wound Dressing one or multiple wounds 0 X- 1 15 Medium Wound Dressing one or multiple wounds []  - 0 Large Wound Dressing one or multiple wounds X- 1 5 Application of Medications - topical []  - 0 Application of Medications - injection INTERVENTIONS - Miscellaneous []  - External ear exam 0 []  - 0 Specimen Collection (cultures, biopsies, blood, body fluids, etc.) []  - 0 Specimen(s) / Culture(s) sent or taken to Lab for analysis []  - 0 Patient Transfer (multiple staff / Civil Service fast streamer / Similar devices) []  - 0 Simple Staple / Suture removal (25 or less) []  - 0 Complex Staple / Suture removal (26 or more) []  - 0 Hypo / Hyperglycemic Management (close monitor of Blood Glucose) []  - 0 Ankle / Brachial Index (ABI) - do not check if billed separately X- 1 5 Vital Signs Has the patient been seen at the hospital within the last three years: Yes Total Score: 85 Level Of Care: New/Established - Level 3 Electronic Signature(s) Signed: 09/11/2019 3:55:21 PM By: Gretta Cool, BSN, RN, CWS, [name] RN, BSN Entered By: Gretta Cool, BSN, RN, CWS, [name] on 09/11/2019 08:20:23 Prince, Julie Belling ([id number]) -------------------------------------------------------------------------------- Encounter Discharge Information Details Patient Name: Julie Prince Date of Service: 09/11/2019 8:00 AM Medical Record Number: [id number] Patient Account Number: [id number] Date of Birth/Sex: [birth year] (58 y.o. F) Treating RN: Cornell Barman Primary Care Chelcey Caputo: [name] Other Clinician: Referring Taylormarie Register: [name] Treating Avalene Sealy/Extender: Tito Dine in Treatment: 3 Encounter Discharge Information Items Discharge Condition: Stable Ambulatory Status:  Ambulatory Discharge Destination: Home Transportation: Private Auto Accompanied By: daughter Schedule Follow-up Appointment: Yes Clinical Summary of Care: Electronic Signature(s) Signed: 09/11/2019 3:55:21 PM By: Gretta Cool, BSN, RN, CWS, [name] RN, BSN Entered By: Gretta Cool, BSN, RN, CWS, [name] on 09/11/2019 08:21:30 [place], Maryjane ([id number]) -------------------------------------------------------------------------------- Lower Extremity Assessment Details Patient Name: Julie Prince Date of Service: 09/11/2019 8:00 AM Medical Record Number: [id number] Patient Account Number: [id number] Date of Birth/Sex: Sep 17, 1960 (58 y.o. F) Treating RN: Army Melia Primary Care Britny Riel: [name] Other Clinician: Referring Io Dieujuste: [name] Treating Shloimy Michalski/Extender: Ricard Dillon Weeks in Treatment: 3 Edema Assessment Assessed: [Left: No] [Right: No] Edema: [Left: N] [Right: o] Vascular Assessment Pulses: Dorsalis Pedis Palpable: [Left:Yes] Electronic Signature(s) Signed: 09/11/2019 10:25:16 AM By: Army Melia Entered By: Army Melia on 09/11/2019 08:11:47 [place], Ciela ([id number]) -------------------------------------------------------------------------------- Multi Wound Chart Details Patient Name: Julie Prince Date of Service: 09/11/2019 8:00 AM Medical Record Number: [id number] Patient Account Number: [id number] Date of Birth/Sex: December 20, 1960 (59 y.o. F) Treating RN: Cornell Barman Primary Care Zoiee Wimmer: [name] Other Clinician: Referring Tyra Michelle: [name] Treating Malique Driskill/Extender: Tito Dine in Treatment: 3 Vital Signs Height(in): 64 Pulse(bpm): 87 Weight(lbs): 210 Blood Pressure(mmHg): 142/72 Body Mass Index(BMI): 36 Temperature(F): 98.3 Respiratory Rate(breaths/min): 16 Photos: [N/A:N/A] Wound Location: Left Toe Fifth N/A N/A Wounding Event: Blister N/A N/A Primary Etiology: Diabetic Wound/Ulcer of the Lower N/A  N/A Extremity Comorbid History: Hypertension, Type II Diabetes N/A N/A Date Acquired: 08/07/2019 N/A N/A Weeks of Treatment: 3 N/A N/A Wound Status: Open N/A N/A Measurements L x W x D (cm) 0.6x0.7x0.2 N/A N/A Area (cm) : 0.33 N/A N/A Volume (cm) : 0.066 N/A N/A % Reduction in Area: 29.90% N/A N/A % Reduction in Volume: 29.80% N/A N/A Classification: Grade 1 N/A N/A Exudate Amount: Medium N/A N/A Exudate Type: Serous N/A N/A Exudate Color: amber N/A N/A Wound Margin: Flat and Intact N/A N/A Granulation Amount: Medium (34-66%) N/A  N/A Granulation Quality: Pink N/A N/A Necrotic Amount: Medium (34-66%) N/A N/A Exposed Structures: Fat Layer (Subcutaneous Tissue) N/A N/A Exposed: Yes Fascia: No Tendon: No Muscle: No Joint: No Bone: No Epithelialization: None N/A N/A Treatment Notes Wound #1 (Left Toe Fifth) Notes Bactracin, SIlvercell, gauze and conform Electronic Signature(s) [place], [place] ([id number]) Signed: 09/11/2019 3:45:53 PM By: Linton Ham MD Entered By: Linton Ham on 09/11/2019 08:23:13 [place], [place] ([id number]) -------------------------------------------------------------------------------- Multi-Disciplinary Care Plan Details Patient Name: Julie Prince Date of Service: 09/11/2019 8:00 AM Medical Record Number: [id number] Patient Account Number: [id number] Date of Birth/Sex: 01/30/1961 (59 y.o. F) Treating RN: Cornell Barman Primary Care Kaison Mcparland: [name] Other Clinician: Referring Namiyah Grantham: [name] Treating Cordelia Bessinger/Extender: Tito Dine in Treatment: 3 Active Inactive Necrotic Tissue Nursing Diagnoses: Impaired tissue integrity related to necrotic/devitalized tissue Goals: Necrotic/devitalized tissue will be minimized in the wound bed Date Initiated: 08/21/2019 Target Resolution Date: 08/28/2019 Goal Status: Active Interventions: Assess patient pain level pre-, during and post procedure and prior to discharge Treatment  Activities: Apply topical anesthetic as ordered : 08/21/2019 Notes: Orientation to the Wound Care Program Nursing Diagnoses: Knowledge deficit related to the wound healing center program Goals: Patient/caregiver will verbalize understanding of the [place] Date Initiated: 08/21/2019 Target Resolution Date: 08/29/2019 Goal Status: Active Interventions: Provide education on orientation to the wound center Notes: Soft Tissue Infection Nursing Diagnoses: Impaired tissue integrity Goals: Patient will remain free of wound infection Date Initiated: 08/21/2019 Target Resolution Date: 08/28/2019 Goal Status: Active Interventions: Assess signs and symptoms of infection every visit Treatment Activities: Systemic antibiotics : 08/21/2019 Notes: Tissue Oxygenation Nursing Diagnoses: Potential alteration in peripheral tissue perfusion (select prior to confirmation of diagnosis) [place], Julie Belling ([id number]) Goals: Patient/caregiver will verbalize understanding of disease process and disease management Date Initiated: 08/21/2019 Target Resolution Date: 08/28/2019 Goal Status: Active Interventions: Assess patient understanding of disease process and management upon diagnosis and as needed Assess peripheral arterial status upon admission and as needed Provide education on tissue oxygenation and ischemia Notes: Wound/Skin Impairment Nursing Diagnoses: Impaired tissue integrity Goals: Ulcer/skin breakdown will have a volume reduction of 30% by week 4 Date Initiated: 08/21/2019 Target Resolution Date: 08/28/2019 Goal Status: Active Interventions: Provide education on ulcer and skin care Notes: Electronic Signature(s) Signed: 09/11/2019 3:55:21 PM By: Gretta Cool, BSN, RN, CWS, [name] RN, BSN Entered By: Gretta Cool, BSN, RN, CWS, [name] on 09/11/2019 08:17:54 [place], Exilda ([id number]) -------------------------------------------------------------------------------- Pain Assessment Details Patient  Name: Julie Prince Date of Service: 09/11/2019 8:00 AM Medical Record Number: [id number] Patient Account Number: [id number] Date of Birth/Sex: 12-Sep-1960 (58 y.o. F) Treating RN: Army Melia Primary Care Tniya Bowditch: [name] Other Clinician: Referring Ariahna Smiddy: [name] Treating Aviana Shevlin/Extender: Tito Dine in Treatment: 3 Active Problems Location of Pain Severity and Description of Pain Patient Has Paino No Site Locations Pain Management and Medication Current Pain Management: Electronic Signature(s) Signed: 09/11/2019 10:25:16 AM By: Army Melia Entered By: Army Melia on 09/11/2019 08:07:34 [place], [place]isa ([id number]) -------------------------------------------------------------------------------- Patient/Caregiver Education Details Patient Name: Julie Prince Date of Service: 09/11/2019 8:00 AM Medical Record Number: [id number] Patient Account Number: [id number] Date of Birth/Gender: 13-May-1960 (59 y.o. F) Treating RN: Cornell Barman Primary Care Physician: [name] Other Clinician: Referring Physician: [name] Treating Physician/Extender: Tito Dine in Treatment: 3 Education Assessment Education Provided To: Patient Education Topics Provided Wound/Skin Impairment: Handouts: Caring for Your Ulcer, Other: Wound care as prescribed Methods: Demonstration, Explain/Verbal Responses: State content correctly Electronic Signature(s) Signed: 09/11/2019 3:55:21 PM By: Gretta Cool, BSN, RN, CWS, [name]  RN, BSN Entered By: Gretta Cool, BSN, RN, CWS, [name] on 09/11/2019 08:20:54 [place], [place] ([id number]) -------------------------------------------------------------------------------- Wound Assessment Details Patient Name: Julie Prince Date of Service: 09/11/2019 8:00 AM Medical Record Number: [id number] Patient Account Number: [id number] Date of Birth/Sex: [birth year] (59 y.o. F) Treating RN: Army Melia Primary Care Dailyn Kempner: [name]  Other Clinician: Referring Deasha Clendenin: [name] Treating Tomeko Scoville/Extender: Tito Dine in Treatment: 3 Wound Status Wound Number: 1 Primary Etiology: Diabetic Wound/Ulcer of the Lower Extremity Wound Location: Left Toe Fifth Wound Status: Open Wounding Event: Blister Comorbid History: Hypertension, Type II Diabetes Date Acquired: 08/07/2019 Weeks Of Treatment: 3 Clustered Wound: No Photos Wound Measurements Length: (cm) 0.6 Width: (cm) 0.7 Depth: (cm) 0.2 Area: (cm) 0.33 Volume: (cm) 0.066 % Reduction in Area: 29.9% % Reduction in Volume: 29.8% Epithelialization: None Wound Description Classification: Grade 1 Wound Margin: Flat and Intact Exudate Amount: Medium Exudate Type: Serous Exudate Color: amber Foul Odor After Cleansing: No Slough/Fibrino Yes Wound Bed Granulation Amount: Medium (34-66%) Exposed Structure Granulation Quality: Pink Fascia Exposed: No Necrotic Amount: Medium (34-66%) Fat Layer (Subcutaneous Tissue) Exposed: Yes Necrotic Quality: Adherent Slough Tendon Exposed: No Muscle Exposed: No Joint Exposed: No Bone Exposed: No Treatment Notes Wound #1 (Left Toe Fifth) Notes Bactracin, SIlvercell, gauze and conform Electronic Signature(s) Signed: 09/11/2019 10:25:16 AM By: Velia Meyer, Halima ([id number]) Entered By: Army Melia on 09/11/2019 08:10:50 [place], Aylssa ([id number]) -------------------------------------------------------------------------------- Vitals Details Patient Name: Julie Prince Date of Service: 09/11/2019 8:00 AM Medical Record Number: [id number] Patient Account Number: [id number] Date of Birth/Sex: 08-22-1960 (59 y.o. F) Treating RN: Army Melia Primary Care Kerstyn Coryell: [name] Other Clinician: Referring Anahy Esh: [name] Treating Sukhraj Esquivias/Extender: Tito Dine in Treatment: 3 Vital Signs Time Taken: 08:07 Temperature (F): 98.3 Height (in): 64 Pulse (bpm):  87 Weight (lbs): 210 Respiratory Rate (breaths/min): 16 Body Mass Index (BMI): 36 Blood Pressure (mmHg): 142/72 Reference Range: 80 - 120 mg / dl Electronic Signature(s) Signed: 09/11/2019 10:25:16 AM By: Army Melia Entered By: Army Melia on 09/11/2019 08:07:30

## 2019-09-11 NOTE — Telephone Encounter (Signed)
Returned the call to the patient. She was calling to see if Dr. Fletcher Anon would be looking at the right side as well as the left during the procedure next week. She has been advised that typically interventions, when needed and if possible, are done on one side at a time. She wanted to know if the right side could be looked at as well.

## 2019-09-11 NOTE — Telephone Encounter (Signed)
Pt called and wanted to ask some personal questions regarding the procedure she is fixing to have done. Please call to discuss

## 2019-09-12 NOTE — Telephone Encounter (Signed)
We did angiography on both legs last year and we know what the problem is.  The focus will be the left leg this time so we do not use excessive amount of contrast.

## 2019-09-13 NOTE — Telephone Encounter (Signed)
The patient has been called and has verbalized her understanding. No further questions at this time.

## 2019-09-17 ENCOUNTER — Telehealth: Payer: Self-pay

## 2019-09-17 NOTE — Telephone Encounter (Signed)
LMTCB for her Pre PV Procedure call for 09/18/19 to be sure all of her questions are answered and instructions.   Need to ask when she had her COVID vaccine.

## 2019-09-17 NOTE — Telephone Encounter (Signed)
Pt contacted pre-Abdominal Aortogram with Lower Extremities scheduled at [facility] for: 09/18/19. Verified arrival time and place: [facility]) at: 8:30 am.   No solid food after midnight prior to cath, clear liquids until 5 AM day of procedure.   AM meds can be  taken pre-cath with sips of water including: ASA 81 mg   Confirmed patient has responsible adult to drive home post procedure and observe 24 hours after arriving home:   You are allowed ONE visitor in the waiting room during your procedure. Both you and your visitor must wear masks.  Pt has not had her COVID Vaccine but has been in quarantine and has not had any exposure or symptoms according to the pt.

## 2019-09-18 ENCOUNTER — Encounter (HOSPITAL_COMMUNITY)
Admission: RE | Disposition: A | Discharge status: Home / Self Care | Payer: Self-pay | Source: Home / Self Care | Attending: Cardiovascular Disease

## 2019-09-18 ENCOUNTER — Ambulatory Visit (HOSPITAL_COMMUNITY)
Admission: RE | Admit: 2019-09-18 | Discharge: 2019-09-18 | Disposition: A | Discharge status: Home / Self Care | Payer: 59 | Attending: Cardiovascular Disease | Admitting: Cardiovascular Disease

## 2019-09-18 ENCOUNTER — Other Ambulatory Visit: Payer: Self-pay

## 2019-09-18 DIAGNOSIS — E1136 Type 2 diabetes mellitus with diabetic cataract: Secondary | ICD-10-CM | POA: Insufficient documentation

## 2019-09-18 DIAGNOSIS — I10 Essential (primary) hypertension: Secondary | ICD-10-CM | POA: Insufficient documentation

## 2019-09-18 DIAGNOSIS — I70245 Atherosclerosis of native arteries of left leg with ulceration of other part of foot: Secondary | ICD-10-CM | POA: Insufficient documentation

## 2019-09-18 DIAGNOSIS — Z87891 Personal history of nicotine dependence: Secondary | ICD-10-CM | POA: Insufficient documentation

## 2019-09-18 DIAGNOSIS — I739 Peripheral vascular disease, unspecified: Secondary | ICD-10-CM

## 2019-09-18 DIAGNOSIS — Z79899 Other long term (current) drug therapy: Secondary | ICD-10-CM | POA: Diagnosis not present

## 2019-09-18 DIAGNOSIS — Z8249 Family history of ischemic heart disease and other diseases of the circulatory system: Secondary | ICD-10-CM | POA: Insufficient documentation

## 2019-09-18 DIAGNOSIS — Z90721 Acquired absence of ovaries, unilateral: Secondary | ICD-10-CM | POA: Diagnosis not present

## 2019-09-18 DIAGNOSIS — E11621 Type 2 diabetes mellitus with foot ulcer: Secondary | ICD-10-CM | POA: Diagnosis not present

## 2019-09-18 DIAGNOSIS — Z794 Long term (current) use of insulin: Secondary | ICD-10-CM | POA: Diagnosis not present

## 2019-09-18 DIAGNOSIS — E785 Hyperlipidemia, unspecified: Secondary | ICD-10-CM | POA: Insufficient documentation

## 2019-09-18 DIAGNOSIS — E1151 Type 2 diabetes mellitus with diabetic peripheral angiopathy without gangrene: Secondary | ICD-10-CM | POA: Insufficient documentation

## 2019-09-18 DIAGNOSIS — Z880 Allergy status to penicillin: Secondary | ICD-10-CM | POA: Diagnosis not present

## 2019-09-18 DIAGNOSIS — Z7982 Long term (current) use of aspirin: Secondary | ICD-10-CM | POA: Diagnosis not present

## 2019-09-18 DIAGNOSIS — K219 Gastro-esophageal reflux disease without esophagitis: Secondary | ICD-10-CM | POA: Diagnosis not present

## 2019-09-18 DIAGNOSIS — L97529 Non-pressure chronic ulcer of other part of left foot with unspecified severity: Secondary | ICD-10-CM | POA: Diagnosis not present

## 2019-09-18 HISTORY — PX: ABDOMINAL AORTOGRAM W/LOWER EXTREMITY: CATH118223

## 2019-09-18 LAB — GLUCOSE, CAPILLARY: Glucose-Capillary: 165 mg/dL — ABNORMAL HIGH (ref 70–99)

## 2019-09-18 SURGERY — ABDOMINAL AORTOGRAM W/LOWER EXTREMITY
Anesthesia: LOCAL

## 2019-09-18 MED ORDER — FENTANYL CITRATE (PF) 100 MCG/2ML IJ SOLN
INTRAMUSCULAR | Status: DC | PRN
  Administered 2019-09-18: 50 ug via INTRAVENOUS

## 2019-09-18 MED ORDER — ONDANSETRON HCL 4 MG/2ML IJ SOLN: 4.0000 mg | Freq: Four times a day (QID) | INTRAMUSCULAR | Status: DC | PRN

## 2019-09-18 MED ORDER — LABETALOL HCL 5 MG/ML IV SOLN
INTRAVENOUS | Status: DC | PRN
  Administered 2019-09-18: 10 mg via INTRAVENOUS

## 2019-09-18 MED ORDER — HEPARIN (PORCINE) IN NACL 1000-0.9 UT/500ML-% IV SOLN
INTRAVENOUS | Status: DC | PRN
  Administered 2019-09-18: 500 mL

## 2019-09-18 MED ORDER — LABETALOL HCL 5 MG/ML IV SOLN: 10.0000 mg | INTRAVENOUS | Status: DC | PRN

## 2019-09-18 MED ORDER — MIDAZOLAM HCL 2 MG/2ML IJ SOLN
INTRAMUSCULAR | Status: DC | PRN
  Administered 2019-09-18: 1 mg via INTRAVENOUS

## 2019-09-18 MED ORDER — SODIUM CHLORIDE 0.9 % IV SOLN: 250.0000 mL | INTRAVENOUS | Status: DC | PRN

## 2019-09-18 MED ORDER — SODIUM CHLORIDE 0.9 % WEIGHT BASED INFUSION
3.0000 mL/kg/h | INTRAVENOUS | Status: AC
  Administered 2019-09-18: 3 mL/kg/h via INTRAVENOUS

## 2019-09-18 MED ORDER — ASPIRIN 81 MG PO CHEW: 81.0000 mg | CHEWABLE_TABLET | ORAL | Status: DC

## 2019-09-18 MED ORDER — LABETALOL HCL 5 MG/ML IV SOLN
INTRAVENOUS | Status: AC
  Filled 2019-09-18: qty 4

## 2019-09-18 MED ORDER — IODIXANOL 320 MG/ML IV SOLN
INTRAVENOUS | Status: DC | PRN
  Administered 2019-09-18: 60 mL

## 2019-09-18 MED ORDER — LIDOCAINE HCL (PF) 1 % IJ SOLN
INTRAMUSCULAR | Status: DC | PRN
  Administered 2019-09-18: 15 mL

## 2019-09-18 MED ORDER — MIDAZOLAM HCL 5 MG/5ML IJ SOLN
INTRAMUSCULAR | Status: AC
  Filled 2019-09-18: qty 5

## 2019-09-18 MED ORDER — LIDOCAINE HCL (PF) 1 % IJ SOLN
INTRAMUSCULAR | Status: AC
  Filled 2019-09-18: qty 30

## 2019-09-18 MED ORDER — SODIUM CHLORIDE 0.9% FLUSH: 3.0000 mL | INTRAVENOUS | Status: DC | PRN

## 2019-09-18 MED ORDER — PROMETHAZINE HCL 25 MG/ML IJ SOLN
25.0000 mg | Freq: Once | INTRAMUSCULAR | Status: AC
  Administered 2019-09-18: 25 mg via INTRAVENOUS
  Filled 2019-09-18: qty 1

## 2019-09-18 MED ORDER — FENTANYL CITRATE (PF) 100 MCG/2ML IJ SOLN
INTRAMUSCULAR | Status: AC
  Filled 2019-09-18: qty 2

## 2019-09-18 MED ORDER — SODIUM CHLORIDE 0.9 % WEIGHT BASED INFUSION: 1.0000 mL/kg/h | INTRAVENOUS | Status: DC

## 2019-09-18 MED ORDER — ONDANSETRON HCL 4 MG/2ML IJ SOLN
INTRAMUSCULAR | Status: DC | PRN
  Administered 2019-09-18: 4 mg via INTRAVENOUS

## 2019-09-18 MED ORDER — SODIUM CHLORIDE 0.9% FLUSH: 3.0000 mL | Freq: Two times a day (BID) | INTRAVENOUS | Status: DC

## 2019-09-18 MED ORDER — ACETAMINOPHEN 325 MG PO TABS: 650.0000 mg | ORAL_TABLET | ORAL | Status: DC | PRN

## 2019-09-18 MED ORDER — ONDANSETRON HCL 4 MG/2ML IJ SOLN
INTRAMUSCULAR | Status: AC
  Filled 2019-09-18: qty 2

## 2019-09-18 MED ORDER — HEPARIN (PORCINE) IN NACL 1000-0.9 UT/500ML-% IV SOLN
INTRAVENOUS | Status: AC
  Filled 2019-09-18: qty 1000

## 2019-09-18 MED ORDER — SODIUM CHLORIDE 0.9 % IV SOLN: INTRAVENOUS | Status: DC

## 2019-09-18 SURGICAL SUPPLY — 12 items
CATH ANGIO 5F PIGTAIL 65CM (CATHETERS) ×2 IMPLANT
CATH CROSS OVER TEMPO 5F (CATHETERS) ×2 IMPLANT
CATH STRAIGHT 5FR 65CM (CATHETERS) ×2 IMPLANT
CLOSURE MYNX CONTROL 5F (Vascular Products) ×2 IMPLANT
KIT MICROPUNCTURE NIT STIFF (SHEATH) ×2 IMPLANT
KIT PV (KITS) ×2 IMPLANT
SHEATH PINNACLE 5F 10CM (SHEATH) ×2 IMPLANT
SHEATH PROBE COVER 6X72 (BAG) ×2 IMPLANT
SYR MEDRAD MARK 7 150ML (SYRINGE) ×2 IMPLANT
TRANSDUCER W/STOPCOCK (MISCELLANEOUS) ×2 IMPLANT
TRAY PV CATH (CUSTOM PROCEDURE TRAY) ×2 IMPLANT
WIRE HITORQ VERSACORE ST 145CM (WIRE) ×2 IMPLANT

## 2019-09-18 NOTE — Discharge Instructions (Signed)
Femoral Site Care This sheet gives you information about how to care for yourself after your procedure. Your health care provider may also give you more specific instructions. If you have problems or questions, contact your health care provider. What can I expect after the procedure? After the procedure, it is common to have:  Bruising that usually fades within 1-2 weeks.  Tenderness at the site. Follow these instructions at home: Wound care  Follow instructions from your health care provider about how to take care of your insertion site. Make sure you: ? Wash your hands with soap and water before you change your bandage (dressing). If soap and water are not available, use hand sanitizer. ? Change your dressing as told by your health care provider. ? Leave stitches (sutures), skin glue, or adhesive strips in place. These skin closures may need to stay in place for 2 weeks or longer. If adhesive strip edges start to loosen and curl up, you may trim the loose edges. Do not remove adhesive strips completely unless your health care provider tells you to do that.  Do not take baths, swim, or use a hot tub until your health care provider approves.  You may shower 24-48 hours after the procedure or as told by your health care provider. ? Gently wash the site with plain soap and water. ? Pat the area dry with a clean towel. ? Do not rub the site. This may cause bleeding.  Do not apply powder or lotion to the site. Keep the site clean and dry.  Check your femoral site every day for signs of infection. Check for: ? Redness, swelling, or pain. ? Fluid or blood. ? Warmth. ? Pus or a bad smell. Activity  For the first 2-3 days after your procedure, or as long as directed: ? Avoid climbing stairs as much as possible. ? Do not squat.  Do not lift anything that is heavier than 10 lb (4.5 kg), or the limit that you are told, until your health care provider says that it is safe.  Rest as  directed. ? Avoid sitting for a long time without moving. Get up to take short walks every 1-2 hours.  Do not drive for 24 hours if you were given a medicine to help you relax (sedative). General instructions  Take over-the-counter and prescription medicines only as told by your health care provider.  Keep all follow-up visits as told by your health care provider. This is important. Contact a health care provider if you have:  A fever or chills.  You have redness, swelling, or pain around your insertion site. Get help right away if:  The catheter insertion area swells very fast.  You pass out.  You suddenly start to sweat or your skin gets clammy.  The catheter insertion area is bleeding, and the bleeding does not stop when you hold steady pressure on the area.  The area near or just beyond the catheter insertion site becomes pale, cool, tingly, or numb. These symptoms may represent a serious problem that is an emergency. Do not wait to see if the symptoms will go away. Get medical help right away. Call your local emergency services (911 in the [place] Do not drive yourself to the hospital. Summary  After the procedure, it is common to have bruising that usually fades within 1-2 weeks.  Check your femoral site every day for signs of infection.  Do not lift anything that is heavier than 10 lb (4.5 kg), or the   limit that you are told, until your health care provider says that it is safe. This information is not intended to replace advice given to you by your health care provider. Make sure you discuss any questions you have with your health care provider. Document Revised: 03/20/2017 Document Reviewed: 03/20/2017 Elsevier Patient Education  2020 [employer]  

## 2019-09-18 NOTE — Progress Notes (Signed)
Discharge instructions reviewed with daughter, Phineas Real. Nausea/vomiting improving. Patient sleepy after phenergren, but easily arousable. Will continue to monitor and anticipate to be able to discharge around 1430.

## 2019-09-18 NOTE — Progress Notes (Signed)
Patient returned from procedure, states continues to be nauseous, after 20-30 min, patient offered ginger ale and agreed it might help. However 5-10 min later patient vomited. Femoral site remains at level 0. Vin, PA contacted and phenergren order received. Will continue to monitor. Vital signs stable.

## 2019-09-18 NOTE — Interval H&P Note (Signed)
History and Physical Interval Note:  09/18/2019 10:43 AM  Julie Prince  has presented today for surgery, with the diagnosis of pad.  The various methods of treatment have been discussed with the patient and family. After consideration of risks, benefits and other options for treatment, the patient has consented to  Procedure(s): ABDOMINAL AORTOGRAM W/LOWER EXTREMITY (N/A) as a surgical intervention.  The patient's history has been reviewed, patient examined, no change in status, stable for surgery.  I have reviewed the patient's chart and labs.  Questions were answered to the patient's satisfaction.     [name]

## 2019-09-19 ENCOUNTER — Encounter (HOSPITAL_COMMUNITY): Payer: Self-pay | Admitting: Cardiovascular Disease

## 2019-09-19 ENCOUNTER — Telehealth: Payer: Self-pay | Admitting: Physician Assistant

## 2019-09-19 NOTE — Telephone Encounter (Signed)
Patient called states she has lost  Major cvg in 2020 ,but now has New Ins thru [facility].  --Pt request new referral be sent to GYN for Appt scheduling.  --Forwarding request to [employer].   ---glh

## 2019-09-25 ENCOUNTER — Other Ambulatory Visit: Payer: Self-pay

## 2019-09-25 ENCOUNTER — Encounter: Payer: 59 | Attending: Internal Medicine | Admitting: Internal Medicine

## 2019-09-25 DIAGNOSIS — E11621 Type 2 diabetes mellitus with foot ulcer: Secondary | ICD-10-CM | POA: Insufficient documentation

## 2019-09-25 DIAGNOSIS — A4901 Methicillin susceptible Staphylococcus aureus infection, unspecified site: Secondary | ICD-10-CM | POA: Diagnosis not present

## 2019-09-25 DIAGNOSIS — E1151 Type 2 diabetes mellitus with diabetic peripheral angiopathy without gangrene: Secondary | ICD-10-CM | POA: Insufficient documentation

## 2019-09-25 DIAGNOSIS — L03116 Cellulitis of left lower limb: Secondary | ICD-10-CM | POA: Diagnosis not present

## 2019-09-25 DIAGNOSIS — L97528 Non-pressure chronic ulcer of other part of left foot with other specified severity: Secondary | ICD-10-CM | POA: Insufficient documentation

## 2019-09-25 DIAGNOSIS — I70245 Atherosclerosis of native arteries of left leg with ulceration of other part of foot: Secondary | ICD-10-CM | POA: Diagnosis not present

## 2019-09-26 NOTE — Progress Notes (Signed)
[place], [place] ([id number]) Visit Report for 09/25/2019 Arrival Information Details Patient Name: Julie Prince, Julie Prince Date of Service: 09/25/2019 3:30 PM Medical Record Number: [id number] Patient Account Number: [id number] Date of Birth/Sex: 1960-06-22 (59 y.o. F) Treating RN: Army Melia Primary Care Kyeshia Zinn: [name] Other Clinician: Referring Brandice Busser: [name] Treating Aadarsh Cozort/Extender: Tito Dine in Treatment: 5 Visit Information History Since Last Visit Added or deleted any medications: No Patient Arrived: Ambulatory Any new allergies or adverse reactions: No Arrival Time: 15:32 Had a fall or experienced change in No Accompanied By: self activities of daily living that may affect Transfer Assistance: None risk of falls: Patient Identification Verified: Yes Signs or symptoms of abuse/neglect since last visito No Patient Has Alerts: Yes Hospitalized since last visit: No Patient Alerts: DMII Has Dressing in Place as Prescribed: Yes Pain Present Now: No Electronic Signature(s) Signed: 09/26/2019 10:39:21 AM By: Army Melia Entered By: Army Melia on 09/25/2019 15:32:49 [place], [place] ([id number]) -------------------------------------------------------------------------------- Clinic Level of Care Assessment Details Patient Name: Julie Prince Date of Service: 09/25/2019 3:30 PM Medical Record Number: [id number] Patient Account Number: [id number] Date of Birth/Sex: May 01, 1960 (59 y.o. F) Treating RN: Cornell Barman Primary Care Dellis Voght: [name] Other Clinician: Referring Makynzie Dobesh: [name] Treating Torrey Horseman/Extender: Tito Dine in Treatment: 5 Clinic Level of Care Assessment Items TOOL 4 Quantity Score []  - Use when only an EandM is performed on FOLLOW-UP visit 0 ASSESSMENTS - Nursing Assessment / Reassessment X - Reassessment of Co-morbidities (includes updates in patient status) 1 10 X- 1 5 Reassessment of Adherence to  Treatment Plan ASSESSMENTS - Wound and Skin Assessment / Reassessment X - Simple Wound Assessment / Reassessment - one wound 1 5 []  - 0 Complex Wound Assessment / Reassessment - multiple wounds []  - 0 Dermatologic / Skin Assessment (not related to wound area) ASSESSMENTS - Focused Assessment []  - Circumferential Edema Measurements - multi extremities 0 []  - 0 Nutritional Assessment / Counseling / Intervention []  - 0 Lower Extremity Assessment (monofilament, tuning fork, pulses) []  - 0 Peripheral Arterial Disease Assessment (using hand held doppler) ASSESSMENTS - Ostomy and/or Continence Assessment and Care []  - Incontinence Assessment and Management 0 []  - 0 Ostomy Care Assessment and Management (repouching, etc.) PROCESS - Coordination of Care X - Simple Patient / Family Education for ongoing care 1 15 []  - 0 Complex (extensive) Patient / Family Education for ongoing care []  - 0 Staff obtains Programmer, systems, Records, Test Results / Process Orders []  - 0 Staff telephones HHA, Nursing Homes / Clarify orders / etc []  - 0 Routine Transfer to another Facility (non-emergent condition) []  - 0 Routine Hospital Admission (non-emergent condition) []  - 0 New Admissions / Biomedical engineer / Ordering NPWT, Apligraf, etc. []  - 0 Emergency Hospital Admission (emergent condition) X- 1 10 Simple Discharge Coordination []  - 0 Complex (extensive) Discharge Coordination PROCESS - Special Needs []  - Pediatric / Minor Patient Management 0 []  - 0 Isolation Patient Management []  - 0 Hearing / Language / Visual special needs []  - 0 Assessment of Community assistance (transportation, D/C planning, etc.) []  - 0 Additional assistance / Altered mentation []  - 0 Support Surface(s) Assessment (bed, cushion, seat, etc.) INTERVENTIONS - Wound Cleansing / Measurement [place], Yanis ([id number]) X- 1 5 Simple Wound Cleansing - one wound []  - 0 Complex Wound Cleansing - multiple wounds X- 1  5 Wound Imaging (photographs - any number of wounds) []  - 0 Wound Tracing (instead of photographs) X- 1 5 Simple Wound Measurement - one wound []  -  0 Complex Wound Measurement - multiple wounds INTERVENTIONS - Wound Dressings []  - Small Wound Dressing one or multiple wounds 0 X- 1 15 Medium Wound Dressing one or multiple wounds []  - 0 Large Wound Dressing one or multiple wounds []  - 0 Application of Medications - topical []  - 0 Application of Medications - injection INTERVENTIONS - Miscellaneous []  - External ear exam 0 []  - 0 Specimen Collection (cultures, biopsies, blood, body fluids, etc.) []  - 0 Specimen(s) / Culture(s) sent or taken to Lab for analysis []  - 0 Patient Transfer (multiple staff / Civil Service fast streamer / Similar devices) []  - 0 Simple Staple / Suture removal (25 or less) []  - 0 Complex Staple / Suture removal (26 or more) []  - 0 Hypo / Hyperglycemic Management (close monitor of Blood Glucose) []  - 0 Ankle / Brachial Index (ABI) - do not check if billed separately X- 1 5 Vital Signs Has the patient been seen at the hospital within the last three years: Yes Total Score: 80 Level Of Care: New/Established - Level 3 Electronic Signature(s) Signed: 09/25/2019 5:53:26 PM By: Gretta Cool, BSN, RN, CWS, [name] RN, BSN Entered By: Gretta Cool, BSN, RN, CWS, [name] on 09/25/2019 15:53:42 [place], [place] ([id number]) -------------------------------------------------------------------------------- Lower Extremity Assessment Details Patient Name: Julie Prince Date of Service: 09/25/2019 3:30 PM Medical Record Number: [id number] Patient Account Number: [id number] Date of Birth/Sex: Mar 13, 1961 (59 y.o. F) Treating RN: Army Melia Primary Care Robi Mitter: [name] Other Clinician: Referring Tamea Bai: [name] Treating Tu Shimmel/Extender: Ricard Dillon Weeks in Treatment: 5 Edema Assessment Assessed: [Left: No] [Right: No] Edema: [Left: N] [Right: o] Vascular  Assessment Pulses: Dorsalis Pedis Palpable: [Left:Yes] Electronic Signature(s) Signed: 09/26/2019 10:39:21 AM By: Army Melia Entered By: Army Melia on 09/25/2019 15:38:00 [place], Kerisha ([id number]) -------------------------------------------------------------------------------- Multi Wound Chart Details Patient Name: Julie Prince Date of Service: 09/25/2019 3:30 PM Medical Record Number: [id number] Patient Account Number: [id number] Date of Birth/Sex: [birth year] (59 y.o. F) Treating RN: Cornell Barman Primary Care Arnet Hofferber: [name] Other Clinician: Referring Dyllon Henken: [name] Treating Kindel Rochefort/Extender: Tito Dine in Treatment: 5 Vital Signs Height(in): 64 Pulse(bpm): 74 Weight(lbs): 210 Blood Pressure(mmHg): 182/85 Body Mass Index(BMI): 36 Temperature(F): 98.3 Respiratory Rate(breaths/min): 16 Photos: [N/A:N/A] Wound Location: Left Toe Fifth N/A N/A Wounding Event: Blister N/A N/A Primary Etiology: Diabetic Wound/Ulcer of the Lower N/A N/A Extremity Comorbid History: Hypertension, Type II Diabetes N/A N/A Date Acquired: 08/07/2019 N/A N/A Weeks of Treatment: 5 N/A N/A Wound Status: Open N/A N/A Measurements L x W x D (cm) 0.6x0.4x0.2 N/A N/A Area (cm) : 0.188 N/A N/A Volume (cm) : 0.038 N/A N/A % Reduction in Area: 60.10% N/A N/A % Reduction in Volume: 59.60% N/A N/A Starting Position 1 (o'clock): 12 Ending Position 1 (o'clock): 12 Maximum Distance 1 (cm): 0.2 Undermining: Yes N/A N/A Classification: Grade 1 N/A N/A Exudate Amount: Medium N/A N/A Exudate Type: Serous N/A N/A Exudate Color: amber N/A N/A Wound Margin: Flat and Intact N/A N/A Granulation Amount: Medium (34-66%) N/A N/A Granulation Quality: Pink N/A N/A Necrotic Amount: Medium (34-66%) N/A N/A Exposed Structures: Fat Layer (Subcutaneous Tissue) N/A N/A Exposed: Yes Fascia: No Tendon: No Muscle: No Joint: No Bone: No Epithelialization: None N/A N/A Treatment  Notes Electronic Signature(s) Signed: 09/25/2019 4:15:00 PM By: Linton Ham MD Entered By: Linton Ham on 09/25/2019 16:06:35 [place], [place] ([id number]) [place], [name] ([id number]) -------------------------------------------------------------------------------- Multi-Disciplinary Care Plan Details Patient Name: Julie Prince Date of Service: 09/25/2019 3:30 PM Medical Record Number: [id number] Patient Account Number: [id number] Date of Birth/Sex: [birth year] (58  y.o. F) Treating RN: Cornell Barman Primary Care Kada Friesen: [name] Other Clinician: Referring Shatina Streets: [name] Treating Cheryn Lundquist/Extender: Tito Dine in Treatment: 5 Active Inactive Necrotic Tissue Nursing Diagnoses: Impaired tissue integrity related to necrotic/devitalized tissue Goals: Necrotic/devitalized tissue will be minimized in the wound bed Date Initiated: 08/21/2019 Target Resolution Date: 08/28/2019 Goal Status: Active Interventions: Assess patient pain level pre-, during and post procedure and prior to discharge Treatment Activities: Apply topical anesthetic as ordered : 08/21/2019 Notes: Orientation to the Wound Care Program Nursing Diagnoses: Knowledge deficit related to the wound healing center program Goals: Patient/caregiver will verbalize understanding of the [place] Date Initiated: 08/21/2019 Target Resolution Date: 08/29/2019 Goal Status: Active Interventions: Provide education on orientation to the wound center Notes: Soft Tissue Infection Nursing Diagnoses: Impaired tissue integrity Goals: Patient will remain free of wound infection Date Initiated: 08/21/2019 Target Resolution Date: 08/28/2019 Goal Status: Active Interventions: Assess signs and symptoms of infection every visit Treatment Activities: Systemic antibiotics : 08/21/2019 Notes: Tissue Oxygenation Nursing Diagnoses: Potential alteration in peripheral tissue perfusion (select prior to  confirmation of diagnosis) [place], [name] ([id number]) Goals: Patient/caregiver will verbalize understanding of disease process and disease management Date Initiated: 08/21/2019 Target Resolution Date: 08/28/2019 Goal Status: Active Interventions: Assess patient understanding of disease process and management upon diagnosis and as needed Assess peripheral arterial status upon admission and as needed Provide education on tissue oxygenation and ischemia Notes: Wound/Skin Impairment Nursing Diagnoses: Impaired tissue integrity Goals: Ulcer/skin breakdown will have a volume reduction of 30% by week 4 Date Initiated: 08/21/2019 Target Resolution Date: 08/28/2019 Goal Status: Active Interventions: Provide education on ulcer and skin care Notes: Electronic Signature(s) Signed: 09/25/2019 5:53:26 PM By: Gretta Cool, BSN, RN, CWS, [name] RN, BSN Entered By: Gretta Cool, BSN, RN, CWS, [name] on 09/25/2019 15:49:10 [place], Maralyn ([id number]) -------------------------------------------------------------------------------- Pain Assessment Details Patient Name: Julie Prince Date of Service: 09/25/2019 3:30 PM Medical Record Number: [id number] Patient Account Number: [id number] Date of Birth/Sex: 05/13/1960 (58 y.o. F) Treating RN: Army Melia Primary Care Aarin Sparkman: [name] Other Clinician: Referring Exie Chrismer: [name] Treating Faye Sanfilippo/Extender: Tito Dine in Treatment: 5 Active Problems Location of Pain Severity and Description of Pain Patient Has Paino No Site Locations Pain Management and Medication Current Pain Management: Electronic Signature(s) Signed: 09/26/2019 10:39:21 AM By: Army Melia Entered By: Army Melia on 09/25/2019 15:33:41 [place], Koralyn ([id number]) -------------------------------------------------------------------------------- Wound Assessment Details Patient Name: Julie Prince Date of Service: 09/25/2019 3:30 PM Medical Record Number: [id number] Patient  Account Number: [id number] Date of Birth/Sex: 04-Aug-1960 (59 y.o. F) Treating RN: Army Melia Primary Care Keyaira Clapham: [name] Other Clinician: Referring Other Atienza: [name] Treating Novice Vrba/Extender: Tito Dine in Treatment: 5 Wound Status Wound Number: 1 Primary Etiology: Diabetic Wound/Ulcer of the Lower Extremity Wound Location: Left Toe Fifth Wound Status: Open Wounding Event: Blister Comorbid History: Hypertension, Type II Diabetes Date Acquired: 08/07/2019 Weeks Of Treatment: 5 Clustered Wound: No Photos Wound Measurements Length: (cm) 0.6 Width: (cm) 0.4 Depth: (cm) 0.2 Area: (cm) 0.188 Volume: (cm) 0.038 % Reduction in Area: 60.1% % Reduction in Volume: 59.6% Epithelialization: None Undermining: Yes Starting Position (o'clock): 12 Ending Position (o'clock): 12 Maximum Distance: (cm) 0.2 Wound Description Classification: Grade 1 Wound Margin: Flat and Intact Exudate Amount: Medium Exudate Type: Serous Exudate Color: amber Foul Odor After Cleansing: No Slough/Fibrino Yes Wound Bed Granulation Amount: Medium (34-66%) Exposed Structure Granulation Quality: Pink Fascia Exposed: No Necrotic Amount: Medium (34-66%) Fat Layer (Subcutaneous Tissue) Exposed: Yes Necrotic Quality: Adherent Slough Tendon Exposed:  No Muscle Exposed: No Joint Exposed: No Bone Exposed: No Electronic Signature(s) Signed: 09/26/2019 10:39:21 AM By: Army Melia Entered By: Army Melia on 09/25/2019 15:36:36 [place], [place] ([id number]) -------------------------------------------------------------------------------- [place] Details Patient Name: Julie Prince Date of Service: 09/25/2019 3:30 PM Medical Record Number: [id number] Patient Account Number: [id number] Date of Birth/Sex: 1961/02/05 (59 y.o. F) Treating RN: Army Melia Primary Care Marwah Disbro: [name] Other Clinician: Referring Talasia Saulter: [name] Treating Laylana Gerwig/Extender: Tito Dine in Treatment: 5 Vital Signs Time Taken: 15:33 Temperature (F): 98.3 Height (in): 64 Pulse (bpm): 74 Weight (lbs): 210 Respiratory Rate (breaths/min): 16 Body Mass Index (BMI): 36 Blood Pressure (mmHg): 182/85 Reference Range: 80 - 120 mg / dl Electronic Signature(s) Signed: 09/26/2019 10:39:21 AM By: Army Melia Entered By: Army Melia on 09/25/2019 15:33:34

## 2019-09-26 NOTE — Progress Notes (Signed)
[place], [place] ([id number]) Visit Report for 09/25/2019 HPI Details Patient Name: Julie Prince, Julie Prince Date of Service: 09/25/2019 3:30 PM Medical Record Number: [id number] Patient Account Number: [id number] Date of Birth/Sex: [birth year] (59 y.o. F) Treating RN: Cornell Barman Primary Care Provider: [name] Other Clinician: Referring Provider: [name] Treating Provider/Extender: Ricard Dillon Weeks in Treatment: 5 History of Present Illness HPI Description: Resulted: 06/06/18 1152 Order Status: Completed Updated: 06/06/18 1201 Narrative: o 1. oNo significant aortoiliac disease. 2. oLeft lower extremity: Flush occlusion of the SFA with reconstitution via extensive collaterals from the profunda distally. oTwo-vessel runoff below the knee via the anterior and posterior tibial arteries. 3. oRight lower extremity: Diffuse SFA disease proximally followed by a total occlusion in the midsegment with reconstitution distally via extensive collaterals from the profunda. oOne-vessel runoff below the knee via the posterior tibial artery. Recommendations: Recommend maximizing medical therapy as the patient has good collaterals. o I am going to start her on small dose cilostazol for symptomatic improvement. ADMISSION 08/21/2019 This is a 59 year old woman with type 2 diabetes and known PAD. She had an angiogram in March 2020 by Dr. Fletcher Anon the results of which are shown above. At that time she had been seeing Dr. Amalia Hailey of podiatry with pain and swelling in her left foot she was felt to have capsulitis. She has been using a cam boot. She came to the attention of Dr. Fletcher Anon in March 2020 after noninvasive studies done on 05/09/2018 showed an ABI on the right of 0.63 with a TBI of 0.21 biphasic waveforms on the left 0.98 ABI with a TBI of 0.16 monophasic waveforms. Her ABIs were felt to be falsely elevated. She went on to have angiography on 06/06/2018. The results of this are above. She was felt to  have good collaterals at that time she did not have any wounds. She had an MRI of the foot and 09/13/2018 that showed marrow edema in the distal calcaneus throughout the midfoot and in the visualized portion of the second and third metatarsals likely due to stress changeo Plantar fasciitis. No fracture or infection The patient states that she has had a wound on the left second toe/callus over the last month. She was seen in the ER and on 2 occasions in late May. On the first she had had cephalexin and doxycycline. She was not having any improvement in the erythema on the dorsal foot which per her description went up to the left medial ankle. On 5/28 she was started on Bactrim which she is in the middle of taking. She has had good improvement in the degree of erythema. Also 2 weeks ago she had a pedicure and she had some bleeding from the first and second toes which became discolored. This actually looks somewhat better than the picture from the ER on 5/28. Past medical history includes type 2 diabetes with known PAD 2. Barrett's esophagus 3. Hypertension 4. Hyperlipidemia ABIs in our clinic were 0.74 on the right and 0.86 on the left. She does not really describe claudication that I can elicit. She is still fairly active 6/9; patient comes back to clinic today for follow-up of the ulcer over her left fifth PIP on the left foot. Culture I did last week showed methicillin sensitive staph aureus which should have been sensitive to the Bactrim DS she was already put on by her primary physician. Noteworthy that she did not tolerate the Bactrim DS very well because of nausea and vomiting. She is allergic to penicillin. She is  using silver alginate on the wound. For 1 reason or another we were not able to arrange a follow-up with Dr. Fletcher Anon to follow-up on her PAD. We will retry to resend the information 6/23; the patient has apparently seen Dr. Fletcher Anon yesterday and has an angiogram booked for next Wednesday.  The ulcer on the dorsal aspect of her left fifth toe looks better less of a purulent surface. Still not completely viable. She cultured MSSA 7/7; the patient's angiogram on 6/30 showed flush occlusion of the last SFA this reconstitutes distally via extensive collaterals from the profunda and two-vessel runoff below the knee via the anterior and posterior tibial arteries. This does not look too much different from her previous study. It was felt that the best way to revascularize would be a femoral to above-knee popliteal artery bypass. He made the comment that as the left toe is improving this might not be needed however the patient clearly describes "2 grocery isle" claudication when she is at the grocery store. She does not have symptoms at rest. She is a non-smoker Engineer, maintenance) Signed: 09/25/2019 4:15:00 PM By: Linton Ham MD Entered By: Linton Ham on 09/25/2019 16:09:55 [address] Julie Prince ([id number]) [place], [place] ([id number]) -------------------------------------------------------------------------------- Physical Exam Details Patient Name: Julie Prince Date of Service: 09/25/2019 3:30 PM Medical Record Number: [id number] Patient Account Number: [id number] Date of Birth/Sex: 1960/08/16 (59 y.o. F) Treating RN: Cornell Barman Primary Care Provider: [name] Other Clinician: Referring Provider: [name] Treating Provider/Extender: Tito Dine in Treatment: 5 Constitutional Patient is hypertensive.. Pulse regular and within target range for patient.Marland Kitchen Respirations regular, non-labored and within target range.. Temperature is normal and within the target range for the patient.Marland Kitchen appears in no distress. Respiratory Respiratory effort is easy and symmetric bilaterally. Rate is normal at rest and on room air.. Cardiovascular Pedal pulses reduced. Psychiatric No evidence of depression, anxiety, or agitation. Calm, cooperative, and communicative.  Appropriate interactions and affect.. Notes Wound exam; the area in question is on the dorsal aspect of the left fifth toe at the level of the PIP. Surface of this is a lot better than the last time I saw this. There is no purulent drainage. No involvement of the underlying joint. There is some undermining which is a significant relative that the small size of the wound however no palpable bone Electronic Signature(s) Signed: 09/25/2019 4:15:00 PM By: Linton Ham MD Entered By: Linton Ham on 09/25/2019 [place], Julie Prince ([id number]) -------------------------------------------------------------------------------- Physician Orders Details Patient Name: Julie Prince Date of Service: 09/25/2019 3:30 PM Medical Record Number: [id number] Patient Account Number: [id number] Date of Birth/Sex: Jan 10, 1961 (58 y.o. F) Treating RN: Cornell Barman Primary Care Provider: [name] Other Clinician: Referring Provider: [name] Treating Provider/Extender: Tito Dine in Treatment: 5 Verbal / Phone Orders: No Diagnosis Coding Wound Cleansing Wound #1 Left Toe Fifth o Clean wound with Normal Saline. o Dial antibacterial soap, wash wounds, rinse and pat dry prior to dressing wounds o No tub bath. Anesthetic (add to Medication List) Wound #1 Left Toe Fifth o Topical Lidocaine 4% cream applied to wound bed prior to debridement (In Clinic Only). Primary Wound Dressing Wound #1 Left Toe Fifth o Silver Alginate - thin layer of bacitracin to wound bed Secondary Dressing Wound #1 Left Toe Fifth o Gauze and Kerlix/Conform Dressing Change Frequency Wound #1 Left Toe Fifth o Change dressing every other day. Follow-up Appointments Wound #1 Left Toe Fifth o Return Appointment in 2 weeks. Off-Loading Wound #1 Left Toe Fifth o  Other: Additional Orders / Instructions Wound #1 Left Toe Fifth o Increase protein intake. Electronic Signature(s) Signed:  09/25/2019 4:15:00 PM By: Linton Ham MD Signed: 09/25/2019 5:53:26 PM By: Gretta Cool BSN, RN, CWS, [name] RN, BSN Entered By: Gretta Cool, BSN, RN, CWS, Julie Prince on 09/25/2019 15:53:02 [place], Julie Prince ([id number]) -------------------------------------------------------------------------------- Problem List Details Patient Name: Julie Prince Date of Service: 09/25/2019 3:30 PM Medical Record Number: [id number] Patient Account Number: [id number] Date of Birth/Sex: Jan 29, 1961 (58 y.o. F) Treating RN: Cornell Barman Primary Care Provider: [name] Other Clinician: Referring Provider: [name] Treating Provider/Extender: Tito Dine in Treatment: 5 Active Problems ICD-10 Encounter Code Description Active Date MDM Diagnosis E11.621 Type 2 diabetes mellitus with foot ulcer 08/21/2019 No Yes L97.528 Non-pressure chronic ulcer of other part of left foot with other specified 08/21/2019 No Yes severity E11.51 Type 2 diabetes mellitus with diabetic peripheral angiopathy without 08/21/2019 No Yes gangrene L03.116 Cellulitis of left lower limb 08/21/2019 No Yes A49.01 Methicillin susceptible Staphylococcus aureus infection, unspecified site 08/28/2019 No Yes Inactive Problems Resolved Problems Electronic Signature(s) Signed: 09/25/2019 4:15:00 PM By: Linton Ham MD Entered By: Linton Ham on 09/25/2019 16:06:27 [place], Julie Prince ([id number]) -------------------------------------------------------------------------------- Progress Note Details Patient Name: Julie Prince Date of Service: 09/25/2019 3:30 PM Medical Record Number: [id number] Patient Account Number: [id number] Date of Birth/Sex: 06-20-1960 (59 y.o. F) Treating RN: Cornell Barman Primary Care Provider: [name] Other Clinician: Referring Provider: [name] Treating Provider/Extender: Ricard Dillon Weeks in Treatment: 5 Subjective History of Present Illness (HPI) Resulted: 06/06/18 1152 Order Status: Completed  Updated: 06/06/18 1201 Narrative: 1. No significant aortoiliac disease. 2. Left lower extremity: Flush occlusion of the SFA with reconstitution via extensive collaterals from the profunda distally. Two-vessel runoff below the knee via the anterior and posterior tibial arteries. 3. 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 One-vessel runoff below the knee via the posterior tibial artery. Recommendations: Recommend maximizing medical therapy as the patient has good collaterals. I am going to start her on small dose cilostazol for symptomatic improvement. ADMISSION 08/21/2019 This is a 59 year old woman with type 2 diabetes and known PAD. She had an angiogram in March 2020 by Dr. Fletcher Anon the results of which are shown above. At that time she had been seeing Dr. Amalia Hailey of podiatry with pain and swelling in her left foot she was felt to have capsulitis. She has been using a cam boot. She came to the attention of Dr. Fletcher Anon in March 2020 after noninvasive studies done on 05/09/2018 showed an ABI on the right of 0.63 with a TBI of 0.21 biphasic waveforms on the left 0.98 ABI with a TBI of 0.16 monophasic waveforms. Her ABIs were felt to be falsely elevated. She went on to have angiography on 06/06/2018. The results of this are above. She was felt to have good collaterals at that time she did not have any wounds. She had an MRI of the foot and 09/13/2018 that showed marrow edema in the distal calcaneus throughout the midfoot and in the visualized portion of the second and third metatarsals likely due to stress changeo Plantar fasciitis. No fracture or infection The patient states that she has had a wound on the left second toe/callus over the last month. She was seen in the ER and on 2 occasions in late May. On the first she had had cephalexin and doxycycline. She was not  having any improvement in the erythema  on the dorsal foot which per her description went up to the left medial ankle. On 5/28 she was started on Bactrim which she is in the middle of taking. She has had good improvement in the degree of erythema. Also 2 weeks ago she had a pedicure and she had some bleeding from the first and second toes which became discolored. This actually looks somewhat better than the picture from the ER on 5/28. Past medical history includes type 2 diabetes with known PAD 2. Barrett's esophagus 3. Hypertension 4. Hyperlipidemia ABIs in our clinic were 0.74 on the right and 0.86 on the left. She does not really describe claudication that I can elicit. She is still fairly active 6/9; patient comes back to clinic today for follow-up of the ulcer over her left fifth PIP on the left foot. Culture I did last week showed methicillin sensitive staph aureus which should have been sensitive to the Bactrim DS she was already put on by her primary physician. Noteworthy that she did not tolerate the Bactrim DS very well because of nausea and vomiting. She is allergic to penicillin. She is using silver alginate on the wound. For 1 reason or another we were not able to arrange a follow-up with Dr. Fletcher Anon to follow-up on her PAD. We will retry to resend the information 6/23; the patient has apparently seen Dr. Fletcher Anon yesterday and has an angiogram booked for next Wednesday. The ulcer on the dorsal aspect of her left fifth toe looks better less of a purulent surface. Still not completely viable. She cultured MSSA 7/7; the patient's angiogram on 6/30 showed flush occlusion of the last SFA this reconstitutes distally via extensive collaterals from the profunda and two-vessel runoff below the knee via the anterior and posterior tibial arteries. This does not look too much different from her previous study. It was felt that the best way to revascularize would be a femoral to above-knee popliteal  artery bypass. He made the comment that as the left toe is improving this might not be needed however the patient clearly describes "2 grocery isle" claudication when she is at the grocery store. She does not have symptoms at rest. She is a non-smoker [place], [place] ([id number]) Constitutional Patient is hypertensive.. Pulse regular and within target range for patient.Marland Kitchen Respirations regular, non-labored and within target range.. Temperature is normal and within the target range for the patient.Marland Kitchen appears in no distress. Vitals Time Taken: 3:33 PM, Height: 64 in, Weight: 210 lbs, BMI: 36, Temperature: 98.3 F, Pulse: 74 bpm, Respiratory Rate: 16 breaths/min, Blood Pressure: 182/85 mmHg. Respiratory Respiratory effort is easy and symmetric bilaterally. Rate is normal at rest and on room air.. Cardiovascular Pedal pulses reduced. Psychiatric No evidence of depression, anxiety, or agitation. Calm, cooperative, and communicative. Appropriate interactions and affect.. General Notes: Wound exam; the area in question is on the dorsal aspect of the left fifth toe at the level of the PIP. Surface of this is a lot better than the last time I saw this. There is no purulent drainage. No involvement of the underlying joint. There is some undermining which is a significant relative that the small size of the wound however no palpable bone Integumentary (Hair, Skin) Wound #1 status is Open. Original cause of wound was Blister. The wound is located on the Left Toe Fifth. The wound measures 0.6cm length x 0.4cm width x 0.2cm depth; 0.188cm^2 area and 0.038cm^3 volume. There is Fat Layer (Subcutaneous Tissue) Exposed exposed. There is undermining starting at  12:00 and ending at 12:00 with a maximum distance of 0.2cm. There is a medium amount of serous drainage noted. The wound margin is flat and intact. There is medium (34-66%) pink granulation within the wound bed. There is a medium (34-66%) amount  of necrotic tissue within the wound bed including Adherent Slough. Assessment Active Problems ICD-10 Type 2 diabetes mellitus with foot ulcer Non-pressure chronic ulcer of other part of left foot with other specified severity Type 2 diabetes mellitus with diabetic peripheral angiopathy without gangrene Cellulitis of left lower limb Methicillin susceptible Staphylococcus aureus infection, unspecified site Plan Wound Cleansing: Wound #1 Left Toe Fifth: Clean wound with Normal Saline. Dial antibacterial soap, wash wounds, rinse and pat dry prior to dressing wounds No tub bath. Anesthetic (add to Medication List): Wound #1 Left Toe Fifth: Topical Lidocaine 4% cream applied to wound bed prior to debridement (In Clinic Only). Primary Wound Dressing: Wound #1 Left Toe Fifth: Silver Alginate - thin layer of bacitracin to wound bed Secondary Dressing: Wound #1 Left Toe Fifth: Gauze and Kerlix/Conform Dressing Change Frequency: Wound #1 Left Toe Fifth: Change dressing every other day. Follow-up Appointments: Wound #1 Left Toe Fifth: Return Appointment in 2 weeks. Off-Loading: Wound #1 Left Toe Fifth: Other: Additional Orders / Instructions: Wound #1 Left Toe Fifth: Increase protein intake. [place], [place] ([id number]) 1. The wound does not look too bad. I am going to change the primary dressing to endoform. Her daughter is changing the dressing 2. Healing this wound is only one issue we are facing. The patient has claudication with minimal activity quantified is to grocery store [place]. She gets an aching in her calf just below the popliteal fossa which forces her to stop and rest. 3. Ultimately I think this patient is going to require the bypass described however we will work on the toe for now and see how this goes. I spent time talking to them about this. Electronic Signature(s) Signed: 09/25/2019 4:15:00 PM By: Linton Ham MD Entered By: Linton Ham on 09/25/2019  16:12:36 [place], Esmee ([id number]) -------------------------------------------------------------------------------- [place] Details Patient Name: Julie Prince Date of Service: 09/25/2019 Medical Record Number: [id number] Patient Account Number: [id number] Date of Birth/Sex: Sep 12, 1960 (58 y.o. F) Treating RN: Cornell Barman Primary Care Provider: [name] Other Clinician: Referring Provider: [name] Treating Provider/Extender: Tito Dine in Treatment: 5 Diagnosis Coding ICD-10 Codes Code Description E11.621 Type 2 diabetes mellitus with foot ulcer L97.528 Non-pressure chronic ulcer of other part of left foot with other specified severity E11.51 Type 2 diabetes mellitus with diabetic peripheral angiopathy without gangrene L03.116 Cellulitis of left lower limb A49.01 Methicillin susceptible Staphylococcus aureus infection, unspecified site Facility Procedures CPT4 Code: 41660630 Description: 16010 - WOUND CARE VISIT-LEV 3 EST PT Modifier: Quantity: 1 Physician Procedures CPT4 Code: 9323557 Description: 32202 - WC PHYS LEVEL 3 - EST PT Modifier: Quantity: 1 CPT4 Code: Description: ICD-10 Diagnosis Description L97.528 Non-pressure chronic ulcer of other part of left foot with other specifie E11.51 Type 2 diabetes mellitus with diabetic peripheral angiopathy without gang E11.621 Type 2 diabetes mellitus with foot ulcer Modifier: d severity rene Quantity: Electronic Signature(s) Signed: 09/25/2019 4:15:00 PM By: Linton Ham MD Entered By: Linton Ham on 09/25/2019 16:13:20

## 2019-10-02 ENCOUNTER — Other Ambulatory Visit: Payer: Self-pay

## 2019-10-02 ENCOUNTER — Encounter: Payer: 59 | Admitting: Internal Medicine

## 2019-10-02 DIAGNOSIS — E11621 Type 2 diabetes mellitus with foot ulcer: Secondary | ICD-10-CM | POA: Diagnosis not present

## 2019-10-03 ENCOUNTER — Other Ambulatory Visit: Payer: Self-pay | Admitting: Family Medicine

## 2019-10-03 DIAGNOSIS — Z1231 Encounter for screening mammogram for malignant neoplasm of breast: Secondary | ICD-10-CM

## 2019-10-03 NOTE — Progress Notes (Signed)
[place], [place] ([id number]) Visit Report for 10/02/2019 Arrival Information Details Patient Name: Julie Prince, Julie Prince Date of Service: 10/02/2019 3:15 PM Medical Record Number: [id number] Patient Account Number: [id number] Date of Birth/Sex: 08-28-1960 (59 y.o. F) Treating RN: Cornell Barman Primary Care Nyeemah Jennette: [name] Other Clinician: Referring Colbie Danner: [name] Treating Kolbey Teichert/Extender: Tito Dine in Treatment: 6 Visit Information History Since Last Visit Added or deleted any medications: No Patient Arrived: Ambulatory Any new allergies or adverse reactions: No Arrival Time: 15:17 Had a fall or experienced change in No Accompanied By: daughter activities of daily living that may affect Transfer Assistance: None risk of falls: Patient Identification Verified: Yes Signs or symptoms of abuse/neglect since last visito No Secondary Verification Process Completed: Yes Hospitalized since last visit: No Patient Has Alerts: Yes Implantable device outside of the clinic excluding No Patient Alerts: DMII cellular tissue based products placed in the center since last visit: Has Dressing in Place as Prescribed: Yes Pain Present Now: No Electronic Signature(s) Signed: 10/02/2019 5:06:28 PM By: Lorine Bears RCP, RRT, CHT Entered By: Lorine Bears on 10/02/2019 15:18:24 [place], Adelae ([id number]) -------------------------------------------------------------------------------- Encounter Discharge Information Details Patient Name: Julie Prince Date of Service: 10/02/2019 3:15 PM Medical Record Number: [id number] Patient Account Number: [id number] Date of Birth/Sex: May 03, 1960 (59 y.o. F) Treating RN: Cornell Barman Primary Care Jakob Kimberlin: [name] Other Clinician: Referring Dimitra Woodstock: [name] Treating Jaleyah Longhi/Extender: Tito Dine in Treatment: 6 Encounter Discharge Information Items Post Procedure  Vitals Discharge Condition: Stable Temperature (F): 98.2 Ambulatory Status: Ambulatory Pulse (bpm): 57 Discharge Destination: Home Respiratory Rate (breaths/min): 16 Transportation: Private Auto Blood Pressure (mmHg): 153/57 Accompanied By: daughter Schedule Follow-up Appointment: Yes Clinical Summary of Care: Electronic Signature(s) Signed: 10/02/2019 6:25:04 PM By: Gretta Cool, BSN, RN, CWS, [name] RN, BSN Entered By: Gretta Cool, BSN, RN, CWS, [name] on 10/02/2019 15:47:54 [place], Kinzy ([id number]) -------------------------------------------------------------------------------- Lower Extremity Assessment Details Patient Name: Julie Prince Date of Service: 10/02/2019 3:15 PM Medical Record Number: [id number] Patient Account Number: [id number] Date of Birth/Sex: February 04, 1961 (58 y.o. F) Treating RN: Army Melia Primary Care Annison Birchard: [name] Other Clinician: Referring Willett Lefeber: [name] Treating Sherena Machorro/Extender: Ricard Dillon Weeks in Treatment: 6 Edema Assessment Assessed: [Left: No] [Right: No] Edema: [Left: N] [Right: o] Vascular Assessment Pulses: Dorsalis Pedis Palpable: [Left:Yes] Electronic Signature(s) Signed: 10/02/2019 4:08:07 PM By: Army Melia Entered By: Army Melia on 10/02/2019 15:36:47 [place], Julie Prince ([id number]) -------------------------------------------------------------------------------- Multi Wound Chart Details Patient Name: Julie Prince Date of Service: 10/02/2019 3:15 PM Medical Record Number: [id number] Patient Account Number: [id number] Date of Birth/Sex: May 16, 1960 (59 y.o. F) Treating RN: Cornell Barman Primary Care Albino Bufford: [name] Other Clinician: Referring Tambra Muller: [name] Treating Yanelis Osika/Extender: Tito Dine in Treatment: 6 Vital Signs Height(in): 64 Pulse(bpm): 75 Weight(lbs): 210 Blood Pressure(mmHg): 153/57 Body Mass Index(BMI): 36 Temperature(F): 98.2 Respiratory Rate(breaths/min):  16 Photos: [N/A:N/A] Wound Location: Left Toe Fifth N/A N/A Wounding Event: Blister N/A N/A Primary Etiology: Diabetic Wound/Ulcer of the Lower N/A N/A Extremity Comorbid History: Hypertension, Type II Diabetes N/A N/A Date Acquired: 08/07/2019 N/A N/A Weeks of Treatment: 6 N/A N/A Wound Status: Open N/A N/A Measurements L x W x D (cm) 0.5x0.3x0.3 N/A N/A Area (cm) : 0.118 N/A N/A Volume (cm) : 0.035 N/A N/A % Reduction in Area: 74.90% N/A N/A % Reduction in Volume: 62.80% N/A N/A Classification: Grade 1 N/A N/A Exudate Amount: Medium N/A N/A Exudate Type: Serous N/A N/A Exudate Color: amber N/A N/A Wound Margin: Flat and Intact N/A N/A Granulation Amount: Medium (34-66%)  N/A N/A Granulation Quality: Pink N/A N/A Necrotic Amount: Medium (34-66%) N/A N/A Exposed Structures: Fat Layer (Subcutaneous Tissue) N/A N/A Exposed: Yes Fascia: No Tendon: No Muscle: No Joint: No Bone: No Epithelialization: None N/A N/A Debridement: Debridement - Excisional N/A N/A Pre-procedure Verification/Time 15:46 N/A N/A Out Taken: Tissue Debrided: Subcutaneous, Slough N/A N/A Level: Skin/Subcutaneous Tissue N/A N/A Debridement Area (sq cm): 0.25 N/A N/A Instrument: Curette N/A N/A Bleeding: None N/A N/A Debridement Treatment Procedure was tolerated well N/A N/A Response: Post Debridement 0.5x0.5x0.3 N/A N/A Measurements L x W x D (cm) Post Debridement Volume: 0.059 N/A N/A (cm) [place], Julie Prince ([id number]) Procedures Performed: Debridement N/A N/A Treatment Notes Wound #1 (Left Toe Fifth) Notes Endform, gauze and conform Electronic Signature(s) Signed: 10/02/2019 4:21:24 PM By: Linton Ham MD Entered By: Linton Ham on 10/02/2019 15:58:20 [place], Julie Prince ([id number]) -------------------------------------------------------------------------------- Multi-Disciplinary Care Plan Details Patient Name: Julie Prince Date of Service: 10/02/2019 3:15 PM Medical Record Number:  [id number] Patient Account Number: [id number] Date of Birth/Sex: 1961-02-17 (59 y.o. F) Treating RN: Cornell Barman Primary Care Onesha Krebbs: [name] Other Clinician: Referring Talar Fraley: [name] Treating Kaula Klenke/Extender: Tito Dine in Treatment: 6 Active Inactive Necrotic Tissue Nursing Diagnoses: Impaired tissue integrity related to necrotic/devitalized tissue Goals: Necrotic/devitalized tissue will be minimized in the wound bed Date Initiated: 08/21/2019 Target Resolution Date: 08/28/2019 Goal Status: Active Interventions: Assess patient pain level pre-, during and post procedure and prior to discharge Treatment Activities: Apply topical anesthetic as ordered : 08/21/2019 Notes: Orientation to the Wound Care Program Nursing Diagnoses: Knowledge deficit related to the wound healing center program Goals: Patient/caregiver will verbalize understanding of the [place] Date Initiated: 08/21/2019 Target Resolution Date: 08/29/2019 Goal Status: Active Interventions: Provide education on orientation to the wound center Notes: Soft Tissue Infection Nursing Diagnoses: Impaired tissue integrity Goals: Patient will remain free of wound infection Date Initiated: 08/21/2019 Target Resolution Date: 08/28/2019 Goal Status: Active Interventions: Assess signs and symptoms of infection every visit Treatment Activities: Systemic antibiotics : 08/21/2019 Notes: Tissue Oxygenation Nursing Diagnoses: Potential alteration in peripheral tissue perfusion (select prior to confirmation of diagnosis) [place], Julie Prince ([id number]) Goals: Patient/caregiver will verbalize understanding of disease process and disease management Date Initiated: 08/21/2019 Target Resolution Date: 08/28/2019 Goal Status: Active Interventions: Assess patient understanding of disease process and management upon diagnosis and as needed Assess peripheral arterial status upon admission and as  needed Provide education on tissue oxygenation and ischemia Notes: Wound/Skin Impairment Nursing Diagnoses: Impaired tissue integrity Goals: Ulcer/skin breakdown will have a volume reduction of 30% by week 4 Date Initiated: 08/21/2019 Target Resolution Date: 08/28/2019 Goal Status: Active Interventions: Provide education on ulcer and skin care Notes: Electronic Signature(s) Signed: 10/02/2019 6:25:04 PM By: Gretta Cool, BSN, RN, CWS, [name] RN, BSN Entered By: Gretta Cool, BSN, RN, CWS, [name] on 10/02/2019 15:42:51 [place], [place] ([id number]) -------------------------------------------------------------------------------- Pain Assessment Details Patient Name: Julie Prince Date of Service: 10/02/2019 3:15 PM Medical Record Number: [id number] Patient Account Number: [id number] Date of Birth/Sex: March 25, 1960 (58 y.o. F) Treating RN: Army Melia Primary Care Lytle Malburg: [name] Other Clinician: Referring Azjah Pardo: [name] Treating Kyson Kupper/Extender: Tito Dine in Treatment: 6 Active Problems Location of Pain Severity and Description of Pain Patient Has Paino No Site Locations Pain Management and Medication Current Pain Management: Electronic Signature(s) Signed: 10/02/2019 4:08:07 PM By: Army Melia Entered By: Army Melia on 10/02/2019 15:33:12 [place], Julie Prince ([id number]) -------------------------------------------------------------------------------- Patient/Caregiver Education Details Patient Name: Julie Prince Date of Service: 10/02/2019 3:15 PM Medical Record Number: [id number] Patient Account Number:  [id number] Date of Birth/Gender: 08/29/1960 (59 y.o. F) Treating RN: Cornell Barman Primary Care Physician: [name] Other Clinician: Referring Physician: [name] Treating Physician/Extender: Tito Dine in Treatment: 6 Education Assessment Education Provided To: Patient Education Topics Provided Wound/Skin Impairment: Handouts: Caring  for Your Ulcer, Other: continue wound care as prescribed. Methods: Demonstration, Explain/Verbal Responses: State content correctly Electronic Signature(s) Signed: 10/02/2019 6:25:04 PM By: Gretta Cool, BSN, RN, CWS, [name] RN, BSN Entered By: Gretta Cool, BSN, RN, CWS, [name] on 10/02/2019 15:46:56 [place], Julie Prince ([id number]) -------------------------------------------------------------------------------- Wound Assessment Details Patient Name: Julie Prince Date of Service: 10/02/2019 3:15 PM Medical Record Number: [id number] Patient Account Number: [id number] Date of Birth/Sex: 27-Jul-1960 (58 y.o. F) Treating RN: Army Melia Primary Care Siyah Mault: [name] Other Clinician: Referring Raelynn Corron: [name] Treating Glenys Snader/Extender: Tito Dine in Treatment: 6 Wound Status Wound Number: 1 Primary Etiology: Diabetic Wound/Ulcer of the Lower Extremity Wound Location: Left Toe Fifth Wound Status: Open Wounding Event: Blister Comorbid History: Hypertension, Type II Diabetes Date Acquired: 08/07/2019 Weeks Of Treatment: 6 Clustered Wound: No Photos Wound Measurements Length: (cm) 0.5 Width: (cm) 0.3 Depth: (cm) 0.3 Area: (cm) 0.118 Volume: (cm) 0.035 % Reduction in Area: 74.9% % Reduction in Volume: 62.8% Epithelialization: None Wound Description Classification: Grade 1 Fo Wound Margin: Flat and Intact Sl Exudate Amount: Medium Exudate Type: Serous Exudate Color: amber ul Odor After Cleansing: No ough/Fibrino Yes Wound Bed Granulation Amount: Medium (34-66%) Exposed Structure Granulation Quality: Pink Fascia Exposed: No Necrotic Amount: Medium (34-66%) Fat Layer (Subcutaneous Tissue) Exposed: Yes Necrotic Quality: Adherent Slough Tendon Exposed: No Muscle Exposed: No Joint Exposed: No Bone Exposed: No Treatment Notes Wound #1 (Left Toe Fifth) Notes Endform, gauze and conform Electronic Signature(s) Signed: 10/02/2019 4:08:07 PM By: Julie Prince,  Julie Prince ([id number]) Entered By: Army Melia on 10/02/2019 15:36:18 [place], Julie Prince ([id number]) -------------------------------------------------------------------------------- Vitals Details Patient Name: Julie Prince Date of Service: 10/02/2019 3:15 PM Medical Record Number: [id number] Patient Account Number: [id number] Date of Birth/Sex: Jan 20, 1961 (59 y.o. F) Treating RN: Cornell Barman Primary Care Monty Spicher: [name] Other Clinician: Referring Sevastian Witczak: [name] Treating Julie Prince/Extender: Tito Dine in Treatment: 6 Vital Signs Time Taken: 15:15 Temperature (F): 98.2 Height (in): 64 Pulse (bpm): 75 Weight (lbs): 210 Respiratory Rate (breaths/min): 16 Body Mass Index (BMI): 36 Blood Pressure (mmHg): 153/57 Reference Range: 80 - 120 mg / dl Electronic Signature(s) Signed: 10/02/2019 5:06:28 PM By: Lorine Bears RCP, RRT, CHT Entered By: Lorine Bears on 10/02/2019 15:19:34

## 2019-10-03 NOTE — Progress Notes (Signed)
[place], [place] ([id number]) Visit Report for 10/02/2019 Debridement Details Patient Name: Julie Prince, Julie Prince Date of Service: 10/02/2019 3:15 PM Medical Record Number: [id number] Patient Account Number: [id number] Date of Birth/Sex: 11-24-1960 (59 y.o. F) Treating RN: Cornell Barman Primary Care Provider: [name] Other Clinician: Referring Provider: [name] Treating Provider/Extender: Tito Dine in Treatment: 6 Debridement Performed for Wound #1 Left Toe Fifth Assessment: Performed By: Physician Ricard Dillon, MD Debridement Type: Debridement Severity of Tissue Pre Debridement: Fat layer exposed Level of Consciousness (Pre- Awake and Alert procedure): Pre-procedure Verification/Time Out Yes - 15:46 Taken: Total Area Debrided (L x W): 0.5 (cm) x 0.5 (cm) = 0.25 (cm) Tissue and other material Non-Viable, Slough, Subcutaneous, Slough debrided: Level: Skin/Subcutaneous Tissue Debridement Description: Excisional Instrument: Curette Bleeding: None Response to Treatment: Procedure was tolerated well Level of Consciousness (Post- Awake and Alert procedure): Post Debridement Measurements of Total Wound Length: (cm) 0.5 Width: (cm) 0.5 Depth: (cm) 0.3 Volume: (cm) 0.059 Character of Wound/Ulcer Post Debridement: Stable Severity of Tissue Post Debridement: Fat layer exposed Post Procedure Diagnosis Same as Pre-procedure Electronic Signature(s) Signed: 10/02/2019 4:21:24 PM By: Linton Ham MD Signed: 10/02/2019 6:25:04 PM By: Gretta Cool, BSN, RN, CWS, [name] RN, BSN Entered By: Linton Ham on 10/02/2019 15:58:37 [place], [place] ([id number]) -------------------------------------------------------------------------------- HPI Details Patient Name: Julie Prince Date of Service: 10/02/2019 3:15 PM Medical Record Number: [id number] Patient Account Number: [id number] Date of Birth/Sex: 17-Jan-1961 (60 y.o. F) Treating RN: Cornell Barman Primary Care Provider:  [name] Other Clinician: Referring Provider: [name] Treating Provider/Extender: Ricard Dillon Weeks in Treatment: 6 History of Present Illness HPI Description: Resulted: 06/06/18 1152 Order Status: Completed Updated: 06/06/18 1201 Narrative: o 1. oNo significant aortoiliac disease. 2. oLeft lower extremity: Flush occlusion of the SFA with reconstitution via extensive collaterals from the profunda distally. oTwo-vessel runoff below the knee via the anterior and posterior tibial arteries. 3. oRight lower extremity: Diffuse SFA disease proximally followed by a total occlusion in the midsegment with reconstitution distally via extensive collaterals from the profunda. oOne-vessel runoff below the knee via the posterior tibial artery. Recommendations: Recommend maximizing medical therapy as the patient has good collaterals. o I am going to start her on small dose cilostazol for symptomatic improvement. ADMISSION 08/21/2019 This is a 59 year old woman with type 2 diabetes and known PAD. She had an angiogram in March 2020 by Dr. Fletcher Anon the results of which are shown above. At that time she had been seeing Dr. Amalia Hailey of podiatry with pain and swelling in her left foot she was felt to have capsulitis. She has been using a cam boot. She came to the attention of Dr. Fletcher Anon in March 2020 after noninvasive studies done on 05/09/2018 showed an ABI on the right of 0.63 with a TBI of 0.21 biphasic waveforms on the left 0.98 ABI with a TBI of 0.16 monophasic waveforms. Her ABIs were felt to be falsely elevated. She went on to have angiography on 06/06/2018. The results of this are above. She was felt to have good collaterals at that time she did not have any wounds. She had an MRI of the foot and 09/13/2018 that showed marrow edema in the distal calcaneus throughout the midfoot and in the visualized portion of the second and third metatarsals likely due to stress changeo Plantar fasciitis. No  fracture or infection The patient states that she has had a wound on the left second toe/callus over the last month. She was seen in the ER and on 2 occasions in  late May. On the first she had had cephalexin and doxycycline. She was not having any improvement in the erythema on the dorsal foot which per her description went up to the left medial ankle. On 5/28 she was started on Bactrim which she is in the middle of taking. She has had good improvement in the degree of erythema. Also 2 weeks ago she had a pedicure and she had some bleeding from the first and second toes which became discolored. This actually looks somewhat better than the picture from the ER on 5/28. Past medical history includes type 2 diabetes with known PAD 2. Barrett's esophagus 3. Hypertension 4. Hyperlipidemia ABIs in our clinic were 0.74 on the right and 0.86 on the left. She does not really describe claudication that I can elicit. She is still fairly active 6/9; patient comes back to clinic today for follow-up of the ulcer over her left fifth PIP on the left foot. Culture I did last week showed methicillin sensitive staph aureus which should have been sensitive to the Bactrim DS she was already put on by her primary physician. Noteworthy that she did not tolerate the Bactrim DS very well because of nausea and vomiting. She is allergic to penicillin. She is using silver alginate on the wound. For 1 reason or another we were not able to arrange a follow-up with Dr. Fletcher Anon to follow-up on her PAD. We will retry to resend the information 6/23; the patient has apparently seen Dr. Fletcher Anon yesterday and has an angiogram booked for next Wednesday. The ulcer on the dorsal aspect of her left fifth toe looks better less of a purulent surface. Still not completely viable. She cultured MSSA 7/7; the patient's angiogram on 6/30 showed flush occlusion of the last SFA this reconstitutes distally via extensive collaterals from the profunda and  two-vessel runoff below the knee via the anterior and posterior tibial arteries. This does not look too much different from her previous study. It was felt that the best way to revascularize would be a femoral to above-knee popliteal artery bypass. He made the comment that as the left toe is improving this might not be needed however the patient clearly describes "2 grocery isle" claudication when she is at the grocery store. She does not have symptoms at rest. She is a non-smoker 7/14; left fifth toe. Using endoform. Not much improvement today. She sees Dr. Fletcher Anon again on 7/27. I am assuming he will consider referring her to vascular surgery if he still thinks bypass is necessary Electronic Signature(s) Signed: 10/02/2019 4:21:24 PM By: Linton Ham MD Entered By: Linton Ham on 10/02/2019 15:59:41 [place], [place] ([id number]) -------------------------------------------------------------------------------- Physical Exam Details Patient Name: Julie Prince Date of Service: 10/02/2019 3:15 PM Medical Record Number: [id number] Patient Account Number: [id number] Date of Birth/Sex: 1960-12-26 (59 y.o. F) Treating RN: Cornell Barman Primary Care Provider: [name] Other Clinician: Referring Provider: [name] Treating Provider/Extender: Tito Dine in Treatment: 6 Constitutional Patient is hypertensive.. Pulse regular and within target range for patient.Marland Kitchen Respirations regular, non-labored and within target range.. Temperature is normal and within the target range for the patient.Marland Kitchen appears in no distress. Notes Wound exam; hearing questions on the dorsal aspect of the left fifth toe. Overhanging skin removed with a #3 curette and debris from the surface of the wound hemostasis with direct pressure. There is no palpable bone no purulence Electronic Signature(s) Signed: 10/02/2019 4:21:24 PM By: Linton Ham MD Entered By: Linton Ham on 10/02/2019 16:12:27 [place],  [place] ([id number]) --------------------------------------------------------------------------------  Physician Orders Details Patient Name: JAVANA, SCHEY Date of Service: 10/02/2019 3:15 PM Medical Record Number: [id number] Patient Account Number: [id number] Date of Birth/Sex: [birth year] (59 y.o. F) Treating RN: Cornell Barman Primary Care Provider: [name] Other Clinician: Referring Provider: [name] Treating Provider/Extender: Tito Dine in Treatment: 6 Verbal / Phone Orders: No Diagnosis Coding Wound Cleansing Wound #1 Left Toe Fifth o Clean wound with Normal Saline. o Dial antibacterial soap, wash wounds, rinse and pat dry prior to dressing wounds o No tub bath. Anesthetic (add to Medication List) Wound #1 Left Toe Fifth o Topical Lidocaine 4% cream applied to wound bed prior to debridement (In Clinic Only). Primary Wound Dressing Wound #1 Left Toe Fifth o Other: - Endoform Secondary Dressing Wound #1 Left Toe Fifth o Gauze and Kerlix/Conform Dressing Change Frequency Wound #1 Left Toe Fifth o Change dressing every other day. Follow-up Appointments Wound #1 Left Toe Fifth o Return Appointment in 1 week. Off-Loading Wound #1 Left Toe Fifth o Other: Additional Orders / Instructions Wound #1 Left Toe Fifth o Increase protein intake. Electronic Signature(s) Signed: 10/02/2019 4:21:24 PM By: Linton Ham MD Signed: 10/02/2019 6:25:04 PM By: Gretta Cool BSN, RN, CWS, [name] RN, BSN Entered By: Gretta Cool, BSN, RN, CWS, [name] on 10/02/2019 15:46:04 [place], [place] ([id number]) -------------------------------------------------------------------------------- Problem List Details Patient Name: Julie Prince Date of Service: 10/02/2019 3:15 PM Medical Record Number: [id number] Patient Account Number: [id number] Date of Birth/Sex: 01-Jun-1960 (58 y.o. F) Treating RN: Cornell Barman Primary Care Provider: [name] Other Clinician: Referring  Provider: [name] Treating Provider/Extender: Tito Dine in Treatment: 6 Active Problems ICD-10 Encounter Code Description Active Date MDM Diagnosis E11.621 Type 2 diabetes mellitus with foot ulcer 08/21/2019 No Yes L97.528 Non-pressure chronic ulcer of other part of left foot with other specified 08/21/2019 No Yes severity E11.51 Type 2 diabetes mellitus with diabetic peripheral angiopathy without 08/21/2019 No Yes gangrene L03.116 Cellulitis of left lower limb 08/21/2019 No Yes A49.01 Methicillin susceptible Staphylococcus aureus infection, unspecified site 08/28/2019 No Yes Inactive Problems Resolved Problems Electronic Signature(s) Signed: 10/02/2019 4:21:24 PM By: Linton Ham MD Entered By: Linton Ham on 10/02/2019 15:58:12 [place], [place] ([id number]) -------------------------------------------------------------------------------- Progress Note Details Patient Name: Julie Prince Date of Service: 10/02/2019 3:15 PM Medical Record Number: [id number] Patient Account Number: [id number] Date of Birth/Sex: 1961-02-10 (59 y.o. F) Treating RN: Cornell Barman Primary Care Provider: [name] Other Clinician: Referring Provider: [name] Treating Provider/Extender: Ricard Dillon Weeks in Treatment: 6 Subjective History of Present Illness (HPI) Resulted: 06/06/18 1152 Order Status: Completed Updated: 06/06/18 1201 Narrative: 1. No significant aortoiliac disease. 2. Left lower extremity: Flush occlusion of the SFA with reconstitution via extensive collaterals from the profunda distally. Two-vessel runoff below the knee via the anterior and posterior tibial arteries. 3. 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 One-vessel runoff below the knee via the posterior tibial artery. Recommendations: Recommend maximizing  medical therapy as the patient has good collaterals. I am going to start her on small dose cilostazol for symptomatic improvement. ADMISSION 08/21/2019 This is a 59 year old woman with type 2 diabetes and known PAD. She had an angiogram in March 2020 by Dr. Fletcher Anon the results of which are shown above. At that time she had been seeing Dr. Amalia Hailey of podiatry with pain and swelling in her left foot she was felt to have capsulitis. She has been using a cam boot. She came  to the attention of Dr. Fletcher Anon in March 2020 after noninvasive studies done on 05/09/2018 showed an ABI on the right of 0.63 with a TBI of 0.21 biphasic waveforms on the left 0.98 ABI with a TBI of 0.16 monophasic waveforms. Her ABIs were felt to be falsely elevated. She went on to have angiography on 06/06/2018. The results of this are above. She was felt to have good collaterals at that time she did not have any wounds. She had an MRI of the foot and 09/13/2018 that showed marrow edema in the distal calcaneus throughout the midfoot and in the visualized portion of the second and third metatarsals likely due to stress changeo Plantar fasciitis. No fracture or infection The patient states that she has had a wound on the left second toe/callus over the last month. She was seen in the ER and on 2 occasions in late May. On the first she had had cephalexin and doxycycline. She was not having any improvement in the erythema on the dorsal foot which per her description went up to the left medial ankle. On 5/28 she was started on Bactrim which she is in the middle of taking. She has had good improvement in the degree of erythema. Also 2 weeks ago she had a pedicure and she had some bleeding from the first and second toes which became discolored. This actually looks somewhat better than the picture from the ER on 5/28. Past medical history includes type 2 diabetes with known PAD 2. Barrett's esophagus 3. Hypertension 4. Hyperlipidemia ABIs in our  clinic were 0.74 on the right and 0.86 on the left. She does not really describe claudication that I can elicit. She is still fairly active 6/9; patient comes back to clinic today for follow-up of the ulcer over her left fifth PIP on the left foot. Culture I did last week showed methicillin sensitive staph aureus which should have been sensitive to the Bactrim DS she was already put on by her primary physician. Noteworthy that she did not tolerate the Bactrim DS very well because of nausea and vomiting. She is allergic to penicillin. She is using silver alginate on the wound. For 1 reason or another we were not able to arrange a follow-up with Dr. Fletcher Anon to follow-up on her PAD. We will retry to resend the information 6/23; the patient has apparently seen Dr. Fletcher Anon yesterday and has an angiogram booked for next Wednesday. The ulcer on the dorsal aspect of her left fifth toe looks better less of a purulent surface. Still not completely viable. She cultured MSSA 7/7; the patient's angiogram on 6/30 showed flush occlusion of the last SFA this reconstitutes distally via extensive collaterals from the profunda and two-vessel runoff below the knee via the anterior and posterior tibial arteries. This does not look too much different from her previous study. It was felt that the best way to revascularize would be a femoral to above-knee popliteal artery bypass. He made the comment that as the left toe is improving this might not be needed however the patient clearly describes "2 grocery isle" claudication when she is at the grocery store. She does not have symptoms at rest. She is a non-smoker 7/14; left fifth toe. Using endoform. Not much improvement today. She sees Dr. Fletcher Anon again on 7/27. I am assuming he will consider referring her to vascular surgery if he still thinks bypass is necessary [place], [place] ([id number]) Objective Constitutional Patient is hypertensive.. Pulse regular and within target range  for patient.Marland Kitchen  Respirations regular, non-labored and within target range.. Temperature is normal and within the target range for the patient.Marland Kitchen appears in no distress. Vitals Time Taken: 3:15 PM, Height: 64 in, Weight: 210 lbs, BMI: 36, Temperature: 98.2 F, Pulse: 75 bpm, Respiratory Rate: 16 breaths/min, Blood Pressure: 153/57 mmHg. General Notes: Wound exam; hearing questions on the dorsal aspect of the left fifth toe. Overhanging skin removed with a #3 curette and debris from the surface of the wound hemostasis with direct pressure. There is no palpable bone no purulence Integumentary (Hair, Skin) Wound #1 status is Open. Original cause of wound was Blister. The wound is located on the Left Toe Fifth. The wound measures 0.5cm length x 0.3cm width x 0.3cm depth; 0.118cm^2 area and 0.035cm^3 volume. There is Fat Layer (Subcutaneous Tissue) Exposed exposed. There is a medium amount of serous drainage noted. The wound margin is flat and intact. There is medium (34-66%) pink granulation within the wound bed. There is a medium (34-66%) amount of necrotic tissue within the wound bed including Adherent Slough. Assessment Active Problems ICD-10 Type 2 diabetes mellitus with foot ulcer Non-pressure chronic ulcer of other part of left foot with other specified severity Type 2 diabetes mellitus with diabetic peripheral angiopathy without gangrene Cellulitis of left lower limb Methicillin susceptible Staphylococcus aureus infection, unspecified site Procedures Wound #1 Pre-procedure diagnosis of Wound #1 is a Diabetic Wound/Ulcer of the Lower Extremity located on the Left Toe Fifth .Severity of Tissue Pre Debridement is: Fat layer exposed. There was a Excisional Skin/Subcutaneous Tissue Debridement with a total area of 0.25 sq cm performed by Ricard Dillon, MD. With the following instrument(s): Curette to remove Non-Viable tissue/material. Material removed includes Subcutaneous Tissue and Slough  and. No specimens were taken. A time out was conducted at 15:46, prior to the start of the procedure. There was no bleeding. The procedure was tolerated well. Post Debridement Measurements: 0.5cm length x 0.5cm width x 0.3cm depth; 0.059cm^3 volume. Character of Wound/Ulcer Post Debridement is stable. Severity of Tissue Post Debridement is: Fat layer exposed. Post procedure Diagnosis Wound #1: Same as Pre-Procedure Plan Wound Cleansing: Wound #1 Left Toe Fifth: Clean wound with Normal Saline. Dial antibacterial soap, wash wounds, rinse and pat dry prior to dressing wounds No tub bath. Anesthetic (add to Medication List): Wound #1 Left Toe Fifth: Topical Lidocaine 4% cream applied to wound bed prior to debridement (In Clinic Only). Primary Wound Dressing: Wound #1 Left Toe Fifth: Other: - Endoform Secondary Dressing: Wound #1 Left Toe Fifth: Gauze and Kerlix/Conform Dressing Change Frequency: Wound #1 Left Toe Fifth: [place], [place] ([id number]) Change dressing every other day. Follow-up Appointments: Wound #1 Left Toe Fifth: Return Appointment in 1 week. Off-Loading: Wound #1 Left Toe Fifth: Other: Additional Orders / Instructions: Wound #1 Left Toe Fifth: Increase protein intake. 1. Still using endoform 2. Sees Dr. Fletcher Anon at the end of the month. Electronic Signature(s) Signed: 10/02/2019 4:21:24 PM By: Linton Ham MD Entered By: Linton Ham on 10/02/2019 16:13:20 [place], [place] ([id number]) -------------------------------------------------------------------------------- [place] Details Patient Name: Julie Prince Date of Service: 10/02/2019 Medical Record Number: [id number] Patient Account Number: [id number] Date of Birth/Sex: 15-Feb-1961 (58 y.o. F) Treating RN: Cornell Barman Primary Care Provider: [name] Other Clinician: Referring Provider: [name] Treating Provider/Extender: Tito Dine in Treatment: 6 Diagnosis Coding ICD-10  Codes Code Description E11.621 Type 2 diabetes mellitus with foot ulcer L97.528 Non-pressure chronic ulcer of other part of left foot with other specified severity E11.51 Type 2 diabetes mellitus with diabetic  peripheral angiopathy without gangrene L03.116 Cellulitis of left lower limb A49.01 Methicillin susceptible Staphylococcus aureus infection, unspecified site Facility Procedures CPT4 Code: [id number] Description: 24462 - DEB SUBQ TISSUE 20 SQ CM/< Modifier: Quantity: 1 CPT4 Code: Description: ICD-10 Diagnosis Description L97.528 Non-pressure chronic ulcer of other part of left foot with other specified E11.621 Type 2 diabetes mellitus with foot ulcer Modifier: severity Quantity: Physician Procedures CPT4 Code: [id number] Description: 11657 - WC PHYS SUBQ TISS 20 SQ CM Modifier: Quantity: 1 CPT4 Code: Description: ICD-10 Diagnosis Description L97.528 Non-pressure chronic ulcer of other part of left foot with other specified E11.621 Type 2 diabetes mellitus with foot ulcer Modifier: severity Quantity: Electronic Signature(s) Signed: 10/02/2019 4:21:24 PM By: Linton Ham MD Entered By: Linton Ham on 10/02/2019 16:13:40

## 2019-10-09 ENCOUNTER — Encounter: Payer: 59 | Admitting: Internal Medicine

## 2019-10-09 ENCOUNTER — Other Ambulatory Visit: Payer: Self-pay

## 2019-10-09 DIAGNOSIS — E11621 Type 2 diabetes mellitus with foot ulcer: Secondary | ICD-10-CM | POA: Diagnosis not present

## 2019-10-11 NOTE — Progress Notes (Signed)
Julie Prince, [place] ([id number]) Visit Report for 10/09/2019 Arrival Information Details Patient Name: Julie Prince, Julie Prince Date of Service: 10/09/2019 8:00 AM Medical Record Number: [id number] Patient Account Number: [id number] Date of Birth/Sex: [birth year] (59 y.o. F) Treating RN: Army Melia Primary Care Laban Orourke: [name] Other Clinician: Referring Pinchos Topel: [name] Treating Murry Khiev/Extender: Tito Dine in Treatment: 7 Visit Information History Since Last Visit Added or deleted any medications: No Patient Arrived: Ambulatory Any new allergies or adverse reactions: No Arrival Time: 08:10 Had a fall or experienced change in No Accompanied By: daughter activities of daily living that may affect Transfer Assistance: None risk of falls: Patient Identification Verified: Yes Signs or symptoms of abuse/neglect since last visito No Patient Has Alerts: Yes Hospitalized since last visit: No Patient Alerts: DMII Has Dressing in Place as Prescribed: Yes Pain Present Now: No Electronic Signature(s) Signed: 10/09/2019 4:24:58 PM By: Army Melia Entered By: Army Melia on 10/09/2019 [place], [place] ([id number]) -------------------------------------------------------------------------------- Clinic Level of Care Assessment Details Patient Name: Julie Prince Date of Service: 10/09/2019 8:00 AM Medical Record Number: [id number] Patient Account Number: [id number] Date of Birth/Sex: 06-25-1960 (59 y.o. F) Treating RN: Cornell Barman Primary Care Gloriajean Okun: [name] Other Clinician: Referring Mandeep Ferch: [name] Treating Coral Soler/Extender: Tito Dine in Treatment: 7 Clinic Level of Care Assessment Items TOOL 4 Quantity Score []  - Use when only an EandM is performed on FOLLOW-UP visit 0 ASSESSMENTS - Nursing Assessment / Reassessment X - Reassessment of Co-morbidities (includes updates in patient status) 1 10 X- 1 5 Reassessment of Adherence  to Treatment Plan ASSESSMENTS - Wound and Skin Assessment / Reassessment X - Simple Wound Assessment / Reassessment - one wound 1 5 []  - 0 Complex Wound Assessment / Reassessment - multiple wounds []  - 0 Dermatologic / Skin Assessment (not related to wound area) ASSESSMENTS - Focused Assessment []  - Circumferential Edema Measurements - multi extremities 0 []  - 0 Nutritional Assessment / Counseling / Intervention []  - 0 Lower Extremity Assessment (monofilament, tuning fork, pulses) []  - 0 Peripheral Arterial Disease Assessment (using hand held doppler) ASSESSMENTS - Ostomy and/or Continence Assessment and Care []  - Incontinence Assessment and Management 0 []  - 0 Ostomy Care Assessment and Management (repouching, etc.) PROCESS - Coordination of Care X - Simple Patient / Family Education for ongoing care 1 15 []  - 0 Complex (extensive) Patient / Family Education for ongoing care []  - 0 Staff obtains Programmer, systems, Records, Test Results / Process Orders []  - 0 Staff telephones HHA, Nursing Homes / Clarify orders / etc []  - 0 Routine Transfer to another Facility (non-emergent condition) []  - 0 Routine Hospital Admission (non-emergent condition) []  - 0 New Admissions / Biomedical engineer / Ordering NPWT, Apligraf, etc. []  - 0 Emergency Hospital Admission (emergent condition) X- 1 10 Simple Discharge Coordination []  - 0 Complex (extensive) Discharge Coordination PROCESS - Special Needs []  - Pediatric / Minor Patient Management 0 []  - 0 Isolation Patient Management []  - 0 Hearing / Language / Visual special needs []  - 0 Assessment of Community assistance (transportation, D/C planning, etc.) []  - 0 Additional assistance / Altered mentation []  - 0 Support Surface(s) Assessment (bed, cushion, seat, etc.) INTERVENTIONS - Wound Cleansing / Measurement [place], [place] ([id number]) X- 1 5 Simple Wound Cleansing - one wound []  - 0 Complex Wound Cleansing - multiple wounds X- 1  5 Wound Imaging (photographs - any number of wounds) []  - 0 Wound Tracing (instead of photographs) X- 1 5 Simple Wound Measurement - one wound []  -  0 Complex Wound Measurement - multiple wounds INTERVENTIONS - Wound Dressings []  - Small Wound Dressing one or multiple wounds 0 X- 1 15 Medium Wound Dressing one or multiple wounds []  - 0 Large Wound Dressing one or multiple wounds []  - 0 Application of Medications - topical []  - 0 Application of Medications - injection INTERVENTIONS - Miscellaneous []  - External ear exam 0 []  - 0 Specimen Collection (cultures, biopsies, blood, body fluids, etc.) []  - 0 Specimen(s) / Culture(s) sent or taken to Lab for analysis []  - 0 Patient Transfer (multiple staff / Civil Service fast streamer / Similar devices) []  - 0 Simple Staple / Suture removal (25 or less) []  - 0 Complex Staple / Suture removal (26 or more) []  - 0 Hypo / Hyperglycemic Management (close monitor of Blood Glucose) []  - 0 Ankle / Brachial Index (ABI) - do not check if billed separately X- 1 5 Vital Signs Has the patient been seen at the hospital within the last three years: Yes Total Score: 80 Level Of Care: New/Established - Level 3 Electronic Signature(s) Signed: 10/10/2019 6:25:29 PM By: Gretta Cool, BSN, RN, CWS, [name] RN, BSN Entered By: Gretta Cool, BSN, RN, CWS, [name] on 10/09/2019 08:23:59 [place], [name] ([id number]) -------------------------------------------------------------------------------- Encounter Discharge Information Details Patient Name: Julie Prince Date of Service: 10/09/2019 8:00 AM Medical Record Number: [id number] Patient Account Number: [id number] Date of Birth/Sex: 03/12/1961 (58 y.o. F) Treating RN: Cornell Barman Primary Care Liyla Radliff: [name] Other Clinician: Referring Jelina Paulsen: [name] Treating Hatsumi Steinhart/Extender: Tito Dine in Treatment: 7 Encounter Discharge Information Items Discharge Condition: Stable Ambulatory Status:  Ambulatory Discharge Destination: Home Transportation: Private Auto Accompanied By: daughter Schedule Follow-up Appointment: Yes Clinical Summary of Care: Electronic Signature(s) Signed: 10/10/2019 6:25:29 PM By: Gretta Cool, BSN, RN, CWS, [name] RN, BSN Entered By: Gretta Cool, BSN, RN, CWS, [name] on 10/09/2019 08:28:12 [place], [name] ([id number]) -------------------------------------------------------------------------------- Lower Extremity Assessment Details Patient Name: Julie Prince Date of Service: 10/09/2019 8:00 AM Medical Record Number: [id number] Patient Account Number: [id number] Date of Birth/Sex: 1961-03-05 (58 y.o. F) Treating RN: Army Melia Primary Care Kasyn Rolph: [name] Other Clinician: Referring Suzette Flagler: [name] Treating Adisen Bennion/Extender: Ricard Dillon Weeks in Treatment: 7 Edema Assessment Assessed: [Left: No] [Right: No] Edema: [Left: N] [Right: o] Vascular Assessment Pulses: Dorsalis Pedis Palpable: [Left:Yes] Electronic Signature(s) Signed: 10/09/2019 4:24:58 PM By: Army Melia Entered By: Army Melia on 10/09/2019 08:17:02 [place], Luella ([id number]) -------------------------------------------------------------------------------- Multi Wound Chart Details Patient Name: Julie Prince Date of Service: 10/09/2019 8:00 AM Medical Record Number: [id number] Patient Account Number: [id number] Date of Birth/Sex: 12-14-1960 (59 y.o. F) Treating RN: Cornell Barman Primary Care Marypat Kimmet: [name] Other Clinician: Referring Trinette Vera: [name] Treating Virgie Chery/Extender: Tito Dine in Treatment: 7 Vital Signs Height(in): 64 Pulse(bpm): 72 Weight(lbs): 210 Blood Pressure(mmHg): 163/65 Body Mass Index(BMI): 36 Temperature(F): 98.9 Respiratory Rate(breaths/min): 16 Photos: [N/A:N/A] Wound Location: Left Toe Fifth N/A N/A Wounding Event: Blister N/A N/A Primary Etiology: Diabetic Wound/Ulcer of the Lower N/A  N/A Extremity Comorbid History: Hypertension, Type II Diabetes N/A N/A Date Acquired: 08/07/2019 N/A N/A Weeks of Treatment: 7 N/A N/A Wound Status: Open N/A N/A Measurements L x W x D (cm) 0.4x0.4x0.3 N/A N/A Area (cm) : 0.126 N/A N/A Volume (cm) : 0.038 N/A N/A % Reduction in Area: 73.20% N/A N/A % Reduction in Volume: 59.60% N/A N/A Classification: Grade 1 N/A N/A Exudate Amount: Medium N/A N/A Exudate Type: Serous N/A N/A Exudate Color: amber N/A N/A Wound Margin: Flat and Intact N/A N/A Granulation Amount: Medium (34-66%) N/A  N/A Granulation Quality: Pink N/A N/A Necrotic Amount: Medium (34-66%) N/A N/A Exposed Structures: Fat Layer (Subcutaneous Tissue) N/A N/A Exposed: Yes Fascia: No Tendon: No Muscle: No Joint: No Bone: No Epithelialization: None N/A N/A Treatment Notes Electronic Signature(s) Signed: 10/09/2019 4:41:04 PM By: Linton Ham MD Entered By: Linton Ham on 10/09/2019 08:25:43 [place], Nitya ([id number]) -------------------------------------------------------------------------------- [place] Details Patient Name: Julie Prince Date of Service: 10/09/2019 8:00 AM Medical Record Number: [id number] Patient Account Number: [id number] Date of Birth/Sex: 1961-01-11 (59 y.o. F) Treating RN: Cornell Barman Primary Care Diarra Kos: [name] Other Clinician: Referring Eddi Hymes: [name] Treating Berna Gitto/Extender: Tito Dine in Treatment: 7 Active Inactive Necrotic Tissue Nursing Diagnoses: Impaired tissue integrity related to necrotic/devitalized tissue Goals: Necrotic/devitalized tissue will be minimized in the wound bed Date Initiated: 08/21/2019 Target Resolution Date: 08/28/2019 Goal Status: Active Interventions: Assess patient pain level pre-, during and post procedure and prior to discharge Treatment Activities: Apply topical anesthetic as ordered : 08/21/2019 Notes: Orientation to the Wound Care  Program Nursing Diagnoses: Knowledge deficit related to the wound healing center program Goals: Patient/caregiver will verbalize understanding of the [place] Date Initiated: 08/21/2019 Target Resolution Date: 08/29/2019 Goal Status: Active Interventions: Provide education on orientation to the wound center Notes: Soft Tissue Infection Nursing Diagnoses: Impaired tissue integrity Goals: Patient will remain free of wound infection Date Initiated: 08/21/2019 Target Resolution Date: 08/28/2019 Goal Status: Active Interventions: Assess signs and symptoms of infection every visit Treatment Activities: Systemic antibiotics : 08/21/2019 Notes: Tissue Oxygenation Nursing Diagnoses: Potential alteration in peripheral tissue perfusion (select prior to confirmation of diagnosis) [place], [name] ([id number]) Goals: Patient/caregiver will verbalize understanding of disease process and disease management Date Initiated: 08/21/2019 Target Resolution Date: 08/28/2019 Goal Status: Active Interventions: Assess patient understanding of disease process and management upon diagnosis and as needed Assess peripheral arterial status upon admission and as needed Provide education on tissue oxygenation and ischemia Notes: Wound/Skin Impairment Nursing Diagnoses: Impaired tissue integrity Goals: Ulcer/skin breakdown will have a volume reduction of 30% by week 4 Date Initiated: 08/21/2019 Target Resolution Date: 08/28/2019 Goal Status: Active Interventions: Provide education on ulcer and skin care Notes: Electronic Signature(s) Signed: 10/10/2019 6:25:29 PM By: Gretta Cool, BSN, RN, CWS, [name] RN, BSN Entered By: Gretta Cool, BSN, RN, CWS, [name] on 10/09/2019 08:22:36 [place], [place] ([id number]) -------------------------------------------------------------------------------- Pain Assessment Details Patient Name: Julie Prince Date of Service: 10/09/2019 8:00 AM Medical Record Number: [id number] Patient  Account Number: [id number] Date of Birth/Sex: Aug 25, 1960 (58 y.o. F) Treating RN: Army Melia Primary Care Tienna Bienkowski: [name] Other Clinician: Referring Chyrl Elwell: [name] Treating Santez Woodcox/Extender: Tito Dine in Treatment: 7 Active Problems Location of Pain Severity and Description of Pain Patient Has Paino No Site Locations Pain Management and Medication Current Pain Management: Electronic Signature(s) Signed: 10/09/2019 4:24:58 PM By: Army Melia Entered By: Army Melia on 10/09/2019 08:11:41 [place], [place] ([id number]) -------------------------------------------------------------------------------- Patient/Caregiver Education Details Patient Name: Julie Prince Date of Service: 10/09/2019 8:00 AM Medical Record Number: [id number] Patient Account Number: [id number] Date of Birth/Gender: [birth year] (58 y.o. F) Treating RN: Cornell Barman Primary Care Physician: [name] Other Clinician: Referring Physician: [name] Treating Physician/Extender: Tito Dine in Treatment: 7 Education Assessment Education Provided To: Patient Education Topics Provided Wound/Skin Impairment: Handouts: Caring for Your Ulcer, Other: continue wound care as prescribed Methods: Demonstration, Explain/Verbal Responses: State content correctly Electronic Signature(s) Signed: 10/10/2019 6:25:29 PM By: Gretta Cool, BSN, RN, CWS, [name] RN, BSN Entered By: Gretta Cool, BSN, RN, CWS, [name] on 10/09/2019 08:24:56 [place],  [name] ([id number]) -------------------------------------------------------------------------------- Wound Assessment Details Patient Name: MEZTLI, LLANAS Date of Service: 10/09/2019 8:00 AM Medical Record Number: [id number] Patient Account Number: [id number] Date of Birth/Sex: Mar 01, 1961 (59 y.o. F) Treating RN: Army Melia Primary Care Daziya Redmond: [name] Other Clinician: Referring Jabarie Pop: [name] Treating Jarad Barth/Extender: Tito Dine in Treatment: 7 Wound Status Wound Number: 1 Primary Etiology: Diabetic Wound/Ulcer of the Lower Extremity Wound Location: Left Toe Fifth Wound Status: Open Wounding Event: Blister Comorbid History: Hypertension, Type II Diabetes Date Acquired: 08/07/2019 Weeks Of Treatment: 7 Clustered Wound: No Photos Wound Measurements Length: (cm) 0.4 % R Width: (cm) 0.4 % R Depth: (cm) 0.3 Epi Area: (cm) 0.126 Tu Volume: (cm) 0.038 Un eduction in Area: 73.2% eduction in Volume: 59.6% thelialization: None nneling: No dermining: No Wound Description Classification: Grade 1 Fo Wound Margin: Flat and Intact Sl Exudate Amount: Medium Exudate Type: Serous Exudate Color: amber ul Odor After Cleansing: No ough/Fibrino Yes Wound Bed Granulation Amount: Medium (34-66%) Exposed Structure Granulation Quality: Pink Fascia Exposed: No Necrotic Amount: Medium (34-66%) Fat Layer (Subcutaneous Tissue) Exposed: Yes Necrotic Quality: Adherent Slough Tendon Exposed: No Muscle Exposed: No Joint Exposed: No Bone Exposed: No Treatment Notes Wound #1 (Left Toe Fifth) Notes Iodoflex, coverlet Electronic Signature(s) Signed: 10/09/2019 4:24:58 PM By: Velia Meyer, [place] ([id number]) Entered By: Army Melia on 10/09/2019 08:16:02 [place], [place] ([id number]) -------------------------------------------------------------------------------- Vitals Details Patient Name: Julie Prince Date of Service: 10/09/2019 8:00 AM Medical Record Number: [id number] Patient Account Number: [id number] Date of Birth/Sex: 03/27/1960 (59 y.o. F) Treating RN: Army Melia Primary Care Maven Varelas: [name] Other Clinician: Referring Kendryck Lacroix: [name] Treating Baljit Liebert/Extender: Tito Dine in Treatment: 7 Vital Signs Time Taken: 08:11 Temperature (F): 98.9 Height (in): 64 Pulse (bpm): 72 Weight (lbs): 210 Respiratory Rate (breaths/min): 16 Body Mass Index (BMI):  36 Blood Pressure (mmHg): 163/65 Reference Range: 80 - 120 mg / dl Electronic Signature(s) Signed: 10/09/2019 4:24:58 PM By: Army Melia Entered By: Army Melia on 10/09/2019 08:11:29

## 2019-10-11 NOTE — Progress Notes (Signed)
[place], [place] ([id number]) Visit Report for 10/09/2019 HPI Details Patient Name: Julie Prince, Julie Prince Date of Service: 10/09/2019 8:00 AM Medical Record Number: [id number] Patient Account Number: [id number] Date of Birth/Sex: 1960/09/16 (59 y.o. F) Treating RN: Cornell Barman Primary Care Provider: [name] Other Clinician: Referring Provider: [name] Treating Provider/Extender: Ricard Dillon Weeks in Treatment: 7 History of Present Illness HPI Description: Resulted: 06/06/18 1152 Order Status: Completed Updated: 06/06/18 1201 Narrative: o 1. oNo significant aortoiliac disease. 2. oLeft lower extremity: Flush occlusion of the SFA with reconstitution via extensive collaterals from the profunda distally. oTwo-vessel runoff below the knee via the anterior and posterior tibial arteries. 3. oRight lower extremity: Diffuse SFA disease proximally followed by a total occlusion in the midsegment with reconstitution distally via extensive collaterals from the profunda. oOne-vessel runoff below the knee via the posterior tibial artery. Recommendations: Recommend maximizing medical therapy as the patient has good collaterals. o I am going to start her on small dose cilostazol for symptomatic improvement. ADMISSION 08/21/2019 This is a 59 year old woman with type 2 diabetes and known PAD. She had an angiogram in March 2020 by Dr. Fletcher Anon the results of which are shown above. At that time she had been seeing Dr. Amalia Hailey of podiatry with pain and swelling in her left foot she was felt to have capsulitis. She has been using a cam boot. She came to the attention of Dr. Fletcher Anon in March 2020 after noninvasive studies done on 05/09/2018 showed an ABI on the right of 0.63 with a TBI of 0.21 biphasic waveforms on the left 0.98 ABI with a TBI of 0.16 monophasic waveforms. Her ABIs were felt to be falsely elevated. She went on to have angiography on 06/06/2018. The results of this are above. She was felt to  have good collaterals at that time she did not have any wounds. She had an MRI of the foot and 09/13/2018 that showed marrow edema in the distal calcaneus throughout the midfoot and in the visualized portion of the second and third metatarsals likely due to stress changeo Plantar fasciitis. No fracture or infection The patient states that she has had a wound on the left second toe/callus over the last month. She was seen in the ER and on 2 occasions in late May. On the first she had had cephalexin and doxycycline. She was not having any improvement in the erythema on the dorsal foot which per her description went up to the left medial ankle. On 5/28 she was started on Bactrim which she is in the middle of taking. She has had good improvement in the degree of erythema. Also 2 weeks ago she had a pedicure and she had some bleeding from the first and second toes which became discolored. This actually looks somewhat better than the picture from the ER on 5/28. Past medical history includes type 2 diabetes with known PAD 2. Barrett's esophagus 3. Hypertension 4. Hyperlipidemia ABIs in our clinic were 0.74 on the right and 0.86 on the left. She does not really describe claudication that I can elicit. She is still fairly active 6/9; patient comes back to clinic today for follow-up of the ulcer over her left fifth PIP on the left foot. Culture I did last week showed methicillin sensitive staph aureus which should have been sensitive to the Bactrim DS she was already put on by her primary physician. Noteworthy that she did not tolerate the Bactrim DS very well because of nausea and vomiting. She is allergic to penicillin. She is  using silver alginate on the wound. For 1 reason or another we were not able to arrange a follow-up with Dr. Fletcher Anon to follow-up on her PAD. We will retry to resend the information 6/23; the patient has apparently seen Dr. Fletcher Anon yesterday and has an angiogram booked for next Wednesday.  The ulcer on the dorsal aspect of her left fifth toe looks better less of a purulent surface. Still not completely viable. She cultured MSSA 7/7; the patient's angiogram on 6/30 showed flush occlusion of the last SFA this reconstitutes distally via extensive collaterals from the profunda and two-vessel runoff below the knee via the anterior and posterior tibial arteries. This does not look too much different from her previous study. It was felt that the best way to revascularize would be a femoral to above-knee popliteal artery bypass. He made the comment that as the left toe is improving this might not be needed however the patient clearly describes "2 grocery isle" claudication when she is at the grocery store. She does not have symptoms at rest. She is a non-smoker 7/14; left fifth toe. Using endoform. Not much improvement today. She sees Dr. Fletcher Anon again on 7/27. I am assuming he will consider referring her to vascular surgery if he still thinks bypass is necessary 7/21; left fifth toe. No major change sees Dr. Fletcher Anon again on 7/27. Question is consideration of referral for vascular surgery. I change the primary dressing to [employer]) [place], [place] ([id number]) Signed: 10/09/2019 4:41:04 PM By: Linton Ham MD Entered By: Linton Ham on 10/09/2019 08:26:44 [place], [name] ([id number]) -------------------------------------------------------------------------------- Physical Exam Details Patient Name: Julie Prince Date of Service: 10/09/2019 8:00 AM Medical Record Number: [id number] Patient Account Number: [id number] Date of Birth/Sex: Oct 08, 1960 (59 y.o. F) Treating RN: Cornell Barman Primary Care Provider: [name] Other Clinician: Referring Provider: [name] Treating Provider/Extender: Tito Dine in Treatment: 7 Constitutional Patient is hypertensive.. Pulse regular and within target range for patient.Marland Kitchen Respirations regular, non-labored  and within target range.. Temperature is normal and within the target range for the patient.Marland Kitchen appears in no distress. Cardiovascular Popliteal pulses not palpable. Pedal pulses are nonpalpable on the left. Notes Wound exam; the area in question is on the dorsal aspect of the left fifth toe. Surface is only semiviable there is adherent slough and a lot of overhanging tissue. I did not debride this today. Electronic Signature(s) Signed: 10/09/2019 4:41:04 PM By: Linton Ham MD Entered By: Linton Ham on 10/09/2019 08:27:42 [place], [name] ([id number]) -------------------------------------------------------------------------------- Physician Orders Details Patient Name: Julie Prince Date of Service: 10/09/2019 8:00 AM Medical Record Number: [id number] Patient Account Number: [id number] Date of Birth/Sex: February 21, 1961 (58 y.o. F) Treating RN: Cornell Barman Primary Care Provider: [name] Other Clinician: Referring Provider: [name] Treating Provider/Extender: Tito Dine in Treatment: 7 Verbal / Phone Orders: No Diagnosis Coding Wound Cleansing Wound #1 Left Toe Fifth o Clean wound with Normal Saline. o Dial antibacterial soap, wash wounds, rinse and pat dry prior to dressing wounds o No tub bath. Anesthetic (add to Medication List) Wound #1 Left Toe Fifth o Topical Lidocaine 4% cream applied to wound bed prior to debridement (In Clinic Only). Primary Wound Dressing Wound #1 Left Toe Fifth o Iodoflex Secondary Dressing Wound #1 Left Toe Fifth o Other - Coverlet Dressing Change Frequency Wound #1 Left Toe Fifth o Change dressing every other day. Follow-up Appointments Wound #1 Left Toe Fifth o Return Appointment in 1 week. Off-Loading Wound #1 Left Toe Fifth o Other: Additional  Orders / Instructions Wound #1 Left Toe Fifth o Increase protein intake. Electronic Signature(s) Signed: 10/09/2019 4:41:04 PM By: Linton Ham  MD Signed: 10/10/2019 6:25:29 PM By: Gretta Cool BSN, RN, CWS, [name] RN, BSN Entered By: Gretta Cool, BSN, RN, CWS, [name] on 10/09/2019 08:25:55 [place], [name] ([id number]) -------------------------------------------------------------------------------- Problem List Details Patient Name: Julie Prince Date of Service: 10/09/2019 8:00 AM Medical Record Number: [id number] Patient Account Number: [id number] Date of Birth/Sex: December 16, 1960 (58 y.o. F) Treating RN: Cornell Barman Primary Care Provider: [name] Other Clinician: Referring Provider: [name] Treating Provider/Extender: Tito Dine in Treatment: 7 Active Problems ICD-10 Encounter Code Description Active Date MDM Diagnosis E11.621 Type 2 diabetes mellitus with foot ulcer 08/21/2019 No Yes L97.528 Non-pressure chronic ulcer of other part of left foot with other specified 08/21/2019 No Yes severity E11.51 Type 2 diabetes mellitus with diabetic peripheral angiopathy without 08/21/2019 No Yes gangrene L03.116 Cellulitis of left lower limb 08/21/2019 No Yes A49.01 Methicillin susceptible Staphylococcus aureus infection, unspecified site 08/28/2019 No Yes Inactive Problems Resolved Problems Electronic Signature(s) Signed: 10/09/2019 4:41:04 PM By: Linton Ham MD Entered By: Linton Ham on 10/09/2019 08:25:32 [place], Tish ([id number]) -------------------------------------------------------------------------------- Progress Note Details Patient Name: Julie Prince Date of Service: 10/09/2019 8:00 AM Medical Record Number: [id number] Patient Account Number: [id number] Date of Birth/Sex: [birth year] (58 y.o. F) Treating RN: Cornell Barman Primary Care Provider: [name] Other Clinician: Referring Provider: [name] Treating Provider/Extender: Ricard Dillon Weeks in Treatment: 7 Subjective History of Present Illness (HPI) Resulted: 06/06/18 1152 Order Status: Completed Updated: 06/06/18 1201 Narrative: 1. No  significant aortoiliac disease. 2. Left lower extremity: Flush occlusion of the SFA with reconstitution via extensive collaterals from the profunda distally. Two-vessel runoff below the knee via the anterior and posterior tibial arteries. 3. 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 One-vessel runoff below the knee via the posterior tibial artery. Recommendations: Recommend maximizing medical therapy as the patient has good collaterals. I am going to start her on small dose cilostazol for symptomatic improvement. ADMISSION 08/21/2019 This is a 59 year old woman with type 2 diabetes and known PAD. She had an angiogram in March 2020 by Dr. Fletcher Anon the results of which are shown above. At that time she had been seeing Dr. Amalia Hailey of podiatry with pain and swelling in her left foot she was felt to have capsulitis. She has been using a cam boot. She came to the attention of Dr. Fletcher Anon in March 2020 after noninvasive studies done on 05/09/2018 showed an ABI on the right of 0.63 with a TBI of 0.21 biphasic waveforms on the left 0.98 ABI with a TBI of 0.16 monophasic waveforms. Her ABIs were felt to be falsely elevated. She went on to have angiography on 06/06/2018. The results of this are above. She was felt to have good collaterals at that time she did not have any wounds. She had an MRI of the foot and 09/13/2018 that showed marrow edema in the distal calcaneus throughout the midfoot and in the visualized portion of the second and third metatarsals likely due to stress changeo Plantar fasciitis. No fracture or infection The patient states that she has had a wound on the left second toe/callus over the last month. She was seen in the ER and on 2 occasions in late May. On the first she had had cephalexin and doxycycline. She was not having any improvement in the erythema on the  dorsal foot which per her description went up to the left medial ankle. On 5/28 she was started on Bactrim which she is in the middle of taking. She has had good improvement in the degree of erythema. Also 2 weeks ago she had a pedicure and she had some bleeding from the first and second toes which became discolored. This actually looks somewhat better than the picture from the ER on 5/28. Past medical history includes type 2 diabetes with known PAD 2. Barrett's esophagus 3. Hypertension 4. Hyperlipidemia ABIs in our clinic were 0.74 on the right and 0.86 on the left. She does not really describe claudication that I can elicit. She is still fairly active 6/9; patient comes back to clinic today for follow-up of the ulcer over her left fifth PIP on the left foot. Culture I did last week showed methicillin sensitive staph aureus which should have been sensitive to the Bactrim DS she was already put on by her primary physician. Noteworthy that she did not tolerate the Bactrim DS very well because of nausea and vomiting. She is allergic to penicillin. She is using silver alginate on the wound. For 1 reason or another we were not able to arrange a follow-up with Dr. Fletcher Anon to follow-up on her PAD. We will retry to resend the information 6/23; the patient has apparently seen Dr. Fletcher Anon yesterday and has an angiogram booked for next Wednesday. The ulcer on the dorsal aspect of her left fifth toe looks better less of a purulent surface. Still not completely viable. She cultured MSSA 7/7; the patient's angiogram on 6/30 showed flush occlusion of the last SFA this reconstitutes distally via extensive collaterals from the profunda and two-vessel runoff below the knee via the anterior and posterior tibial arteries. This does not look too much different from her previous study. It was felt that the best way to revascularize would be a femoral to above-knee popliteal artery bypass. He made the comment that as the  left toe is improving this might not be needed however the patient clearly describes "2 grocery isle" claudication when she is at the grocery store. She does not have symptoms at rest. She is a non-smoker 7/14; left fifth toe. Using endoform. Not much improvement today. She sees Dr. Fletcher Anon again on 7/27. I am assuming he will consider referring her to vascular surgery if he still thinks bypass is necessary 7/21; left fifth toe. No major change sees Dr. Fletcher Anon again on 7/27. Question is consideration of referral for vascular surgery. I change the primary dressing to [employer] ([id number]) Objective Constitutional Patient is hypertensive.. Pulse regular and within target range for patient.Marland Kitchen Respirations regular, non-labored and within target range.. Temperature is normal and within the target range for the patient.Marland Kitchen appears in no distress. Vitals Time Taken: 8:11 AM, Height: 64 in, Weight: 210 lbs, BMI: 36, Temperature: 98.9 F, Pulse: 72 bpm, Respiratory Rate: 16 breaths/min, Blood Pressure: 163/65 mmHg. Cardiovascular Popliteal pulses not palpable. Pedal pulses are nonpalpable on the left. General Notes: Wound exam; the area in question is on the dorsal aspect of the left fifth toe. Surface is only semiviable there is adherent slough and a lot of overhanging tissue. I did not debride this today. Integumentary (Hair, Skin) Wound #1 status is Open. Original cause of wound was Blister. The wound is located on the Left Toe Fifth. The wound measures 0.4cm length x 0.4cm width x 0.3cm depth; 0.126cm^2 area and 0.038cm^3 volume. There is Fat Layer (Subcutaneous Tissue) Exposed exposed.  There is no tunneling or undermining noted. There is a medium amount of serous drainage noted. The wound margin is flat and intact. There is medium (34- 66%) pink granulation within the wound bed. There is a medium (34-66%) amount of necrotic tissue within the wound bed including  Adherent Slough. Assessment Active Problems ICD-10 Type 2 diabetes mellitus with foot ulcer Non-pressure chronic ulcer of other part of left foot with other specified severity Type 2 diabetes mellitus with diabetic peripheral angiopathy without gangrene Cellulitis of left lower limb Methicillin susceptible Staphylococcus aureus infection, unspecified site Plan Wound Cleansing: Wound #1 Left Toe Fifth: Clean wound with Normal Saline. Dial antibacterial soap, wash wounds, rinse and pat dry prior to dressing wounds No tub bath. Anesthetic (add to Medication List): Wound #1 Left Toe Fifth: Topical Lidocaine 4% cream applied to wound bed prior to debridement (In Clinic Only). Primary Wound Dressing: Wound #1 Left Toe Fifth: Iodoflex Secondary Dressing: Wound #1 Left Toe Fifth: Other - Coverlet Dressing Change Frequency: Wound #1 Left Toe Fifth: Change dressing every other day. Follow-up Appointments: Wound #1 Left Toe Fifth: Return Appointment in 1 week. Off-Loading: Wound #1 Left Toe Fifth: Other: Additional Orders / Instructions: Wound #1 Left Toe Fifth: Increase protein intake. [place], [place] ([id number]) 1. Change the primary dressing to Iodoflex to help debride a stalled wound. No mechanical debridement today 2. I think this patient probably needs consideration of vascular surgery Electronic Signature(s) Signed: 10/09/2019 4:41:04 PM By: Linton Ham MD Entered By: Linton Ham on 10/09/2019 08:29:47 [place], Shunna ([id number]) -------------------------------------------------------------------------------- [place] Details Patient Name: Julie Prince Date of Service: 10/09/2019 Medical Record Number: [id number] Patient Account Number: [id number] Date of Birth/Sex: 1960-12-27 (58 y.o. F) Treating RN: Cornell Barman Primary Care Provider: [name] Other Clinician: Referring Provider: [name] Treating Provider/Extender: Tito Dine in  Treatment: 7 Diagnosis Coding ICD-10 Codes Code Description E11.621 Type 2 diabetes mellitus with foot ulcer L97.528 Non-pressure chronic ulcer of other part of left foot with other specified severity E11.51 Type 2 diabetes mellitus with diabetic peripheral angiopathy without gangrene L03.116 Cellulitis of left lower limb A49.01 Methicillin susceptible Staphylococcus aureus infection, unspecified site Facility Procedures CPT4 Code: 29518841 Description: 66063 - WOUND CARE VISIT-LEV 3 EST PT Modifier: Quantity: 1 Physician Procedures CPT4 Code: 0160109 Description: 32355 - WC PHYS LEVEL 3 - EST PT Modifier: Quantity: 1 CPT4 Code: Description: ICD-10 Diagnosis Description E11.621 Type 2 diabetes mellitus with foot ulcer L97.528 Non-pressure chronic ulcer of other part of left foot with other specifie Modifier: d severity Quantity: Electronic Signature(s) Signed: 10/09/2019 4:41:04 PM By: Linton Ham MD Entered By: Linton Ham on 10/09/2019 08:30:07

## 2019-10-15 ENCOUNTER — Encounter: Payer: Self-pay | Admitting: Cardiovascular Disease

## 2019-10-15 ENCOUNTER — Other Ambulatory Visit: Payer: Self-pay

## 2019-10-15 ENCOUNTER — Ambulatory Visit (INDEPENDENT_AMBULATORY_CARE_PROVIDER_SITE_OTHER): Payer: 59 | Admitting: Cardiovascular Disease

## 2019-10-15 VITALS — BP 145/71 | HR 74 | Ht 64.0 in | Wt 220.4 lb

## 2019-10-15 DIAGNOSIS — R000 Tachycardia, unspecified: Secondary | ICD-10-CM | POA: Diagnosis not present

## 2019-10-15 DIAGNOSIS — I4711 Inappropriate sinus tachycardia, so stated: Secondary | ICD-10-CM

## 2019-10-15 DIAGNOSIS — E785 Hyperlipidemia, unspecified: Secondary | ICD-10-CM | POA: Diagnosis not present

## 2019-10-15 DIAGNOSIS — I10 Essential (primary) hypertension: Secondary | ICD-10-CM

## 2019-10-15 DIAGNOSIS — I739 Peripheral vascular disease, unspecified: Secondary | ICD-10-CM | POA: Diagnosis not present

## 2019-10-15 NOTE — Progress Notes (Signed)
Cardiology Office Note   Date:  10/15/2019   ID:  [name] Flynt, DOB [birth year], MRN [id number]  PCP:  [name], PA-C  Cardiologist: Dr. Oval Linsey  No chief complaint on file.     History of Present Illness: Julie Prince is a 59 y.o. female who is here today for follow-up visit regarding peripheral arterial disease.   She has history of inappropriate sinus tachycardia, diabetes and hyperlipidemia. She is a previous smoker and quit more than 20 years ago. She has known history of peripheral arterial disease. Angiography in March of 2020 showed no significant aortoiliac disease, on the left, there was flush occlusion of the SFA with reconstitution via extensive collaterals from the profunda with two-vessel runoff below the knee.  On the right side, there was diffuse disease in the proximal portion of the SFA with total occlusion in the midsegment with reconstitution distally via extensive collaterals and one-vessel runoff below the knee.  She was started on small dose cilostazol.  At that time, she had no severe claudication or lower extremity ulceration and thus medical therapy was recommended.  However, she was seen recently for small ulceration on the lateral side of the left small toe.  She has been going to the wound clinic.  I repeated her angiography last month which showed similar findings to last year.  I felt that the left SFA was not optimal for endovascular intervention.  Initially, the ulceration appeared to be improving and thus I will hold off on referral for surgical revascularization.  However, in spite of optimal wound care, she continues to have the ulceration and if anything it seems to be a little bit worse.  Past Medical History:  Diagnosis Date  . Allergy   . Barrett esophagus   . Cataract    left cataract removal  . Essential hypertension, benign 09/12/2013  . GERD (gastroesophageal reflux disease)   . Hyperlipidemia 09/12/2013  . Inappropriate sinus  tachycardia 04/12/2018  . Type 2 diabetes mellitus ([place]) 09/12/2013   Dr. Posey Pronto at [facility]     Past Surgical History:  Procedure Laterality Date  . ABDOMINAL AORTOGRAM W/LOWER EXTREMITY N/A 06/06/2018   Procedure: ABDOMINAL AORTOGRAM W/LOWER EXTREMITY;  Surgeon: Wellington Hampshire, MD;  Location: [facility];  Service: Cardiovascular;  Laterality: N/A;  . ABDOMINAL AORTOGRAM W/LOWER EXTREMITY N/A 09/18/2019   Procedure: ABDOMINAL AORTOGRAM W/LOWER EXTREMITY;  Surgeon: Wellington Hampshire, MD;  Location: [facility];  Service: Cardiovascular;  Laterality: N/A;  . APPENDECTOMY    . CATARACT EXTRACTION Left   . CESAREAN SECTION  1990  . COLONOSCOPY    . right tube and ovary removed    . WISDOM TOOTH EXTRACTION       Current Outpatient Medications  Medication Sig Dispense Refill  . aspirin 81 MG chewable tablet Chew 81 mg by mouth daily.     Marland Kitchen atorvastatin (LIPITOR) 40 MG tablet Take 1 tablet (40 mg total) by mouth at bedtime. 90 tablet 1  . Cholecalciferol (VITAMIN D3 PO) Take 500 mg by mouth daily.    . cilostazol (PLETAL) 50 MG tablet TAKE 1 TABLET BY MOUTH TWICE A DAY (Patient taking differently: Take 50 mg by mouth 2 (two) times daily. ) 180 tablet 1  . Continuous Blood Gluc Sensor (FREESTYLE LIBRE 14 DAY SENSOR) MISC 1 each by Other route See admin instructions. Test daily as directed and change every 14 days.    . metoprolol tartrate (LOPRESSOR) 50 MG tablet Take 1.5  tablets (75 mg total) by mouth 2 (two) times daily. (Patient taking differently: Take 50 mg by mouth 2 (two) times daily. ) 270 tablet 1  . TRESIBA FLEXTOUCH 100 UNIT/ML SOPN FlexTouch Pen INJECT 50 UNITS SUBCUTANEOUS AT BEDTIME (Patient taking differently: Inject 50 Units into the skin at bedtime. ) 15 pen 5   Current Facility-Administered Medications  Medication Dose Route Frequency Provider Last Rate Last Admin  . 0.9 %  sodium chloride infusion  500 mL Intravenous Once Pyrtle, Lajuan Lines, MD         Allergies:   Penicillins and Metformin and related    Social History:  The patient  reports that she has quit smoking. She smoked 0.50 packs per day. She has never used smokeless tobacco. She reports that she does not drink alcohol and does not use drugs.   Family History:  The patient's family history includes Alzheimer's disease in her maternal grandmother; Fibromyalgia in her sister; Heart disease in her maternal aunt; Lung cancer in her mother; Stroke in her father.    ROS:  Please see the history of present illness.   Otherwise, review of systems are positive for none.   All other systems are reviewed and negative.    PHYSICAL EXAM: VS:  BP (!) 145/71   Pulse 74   Ht 5\' 4"  (1.626 m)   Wt (!) 220 lb 6.4 oz (100 kg)   SpO2 96%   BMI 37.83 kg/m  , BMI Body mass index is 37.83 kg/m. GEN: Well nourished, well developed, in no acute distress  HEENT: normal  Neck: no JVD, carotid bruits, or masses Cardiac: RRR; no murmurs, rubs, or gallops,no edema  Respiratory:  clear to auscultation bilaterally, normal work of breathing GI: soft, nontender, nondistended, + BS MS: no deformity or atrophy  Skin: warm and dry, no rash Neuro:  Strength and sensation are intact Psych: euthymic mood, full affect Vascular: Distal pulses are not palpable.  There is a 5-6 mm ulceration on the left lateral small toe.  No groin hematoma    EKG:  EKG is not ordered today.  Recent Labs: 12/26/2018: TSH 1.790 08/21/2019: ALT 11 09/10/2019: BUN 10; Creatinine, Ser 0.89; Hemoglobin 14.4; Platelets 247; Potassium 4.8; Sodium 139    Lipid Panel    Component Value Date/Time   CHOL 208 (H) 12/26/2018 0941   TRIG 95 12/26/2018 0941   HDL 46 12/26/2018 0941   CHOLHDL 4.5 (H) 12/26/2018 0941   CHOLHDL 6.4 (H) 04/26/2016 1412   VLDL 32 (H) 04/26/2016 1412   LDLCALC 145 (H) 12/26/2018 0941      Wt Readings from Last 3 Encounters:  10/15/19 (!) 220 lb 6.4 oz (100 kg)  09/18/19 213 lb (96.6 kg)   09/10/19 213 lb 6.4 oz (96.8 kg)        PAD Screen 05/22/2018  Previous PAD dx? No  Previous surgical procedure? No  Pain with walking? Yes  Subsides with rest? No  Feet/toe relief with dangling? No  Painful, non-healing ulcers? No  Extremities discolored? No      ASSESSMENT AND PLAN:  1.  Peripheral arterial disease: She continues to have nonhealing ulceration on the left small toe.  This did not improve at all over the last month and if anything, it has worsened.  Due to that, I recommend referral to vascular surgery for left femoral-popliteal bypass.  I discussed the surgery in details and we will try to expedite her appointment.   2.  Essential hypertension: Continue  metoprolol and losartan  3.  Hyperlipidemia: Continue treatment with atorvastatin with a target LDL of less than 70.    She will require a follow-up lipid and liver profile.  4.  Diabetes mellitus: Most recent hemoglobin A1c improved significantly to 5.8 from 10.  5.  Inappropriate sinus tachycardia versus ectopic atrial tachycardia: Improved with metoprolol and currently she takes 50 mg twice daily.  6.  Preoperative cardiovascular evaluation for anticipated vascular surgery: The patient has no anginal symptoms and has good functional capacity.  Recent EKG in June showed no ischemic findings.  The patient does not require ischemic cardiac evaluation before surgery and she should be considered at an overall low risk.   Disposition:   FU with me in 2 months  Signed,  [name], MD  10/15/2019 10:12 AM    [place]

## 2019-10-15 NOTE — Patient Instructions (Signed)
Medication Instructions:  No changes *If you need a refill on your cardiac medications before your next appointment, please call your pharmacy*   Lab Work: None ordered If you have labs (blood work) drawn today and your tests are completely normal, you will receive your results only by: Marland Kitchen MyChart Message (if you have MyChart) OR . A paper copy in the mail If you have any lab test that is abnormal or we need to change your treatment, we will call you to review the results.   Testing/Procedures: None ordered   Follow-Up: At [facility], you and your health needs are our priority.  As part of our continuing mission to provide you with exceptional heart care, we have created designated Provider Care Teams.  These Care Teams include your primary Cardiologist (physician) and Advanced Practice Providers (APPs -  Physician Assistants and Nurse Practitioners) who all work together to provide you with the care you need, when you need it.  We recommend signing up for the patient portal called "MyChart".  Sign up information is provided on this After Visit Summary.  MyChart is used to connect with patients for Virtual Visits (Telemedicine).  Patients are able to view lab/test results, encounter notes, upcoming appointments, etc.  Non-urgent messages can be sent to your provider as well.   To learn more about what you can do with MyChart, go to [email or web address].    Your next appointment:   2 month(s)  The format for your next appointment:   In Person  Provider:   [name], MD   Other Instructions A referral has been made to [facility].

## 2019-10-16 ENCOUNTER — Encounter: Payer: 59 | Admitting: Internal Medicine

## 2019-10-16 DIAGNOSIS — E11621 Type 2 diabetes mellitus with foot ulcer: Secondary | ICD-10-CM | POA: Diagnosis not present

## 2019-10-16 NOTE — Progress Notes (Signed)
[place], [place] ([id number]) Visit Report for 10/16/2019 HPI Details Patient Name: Julie Prince, Julie Prince Date of Service: 10/16/2019 3:30 PM Medical Record Number: [id number] Patient Account Number: [id number] Date of Birth/Sex: 09-21-1960 (59 y.o. F) Treating RN: Cornell Barman Primary Care Provider: [name] Other Clinician: Referring Provider: [name] Treating Provider/Extender: Ricard Dillon Weeks in Treatment: 8 History of Present Illness HPI Description: Resulted: 06/06/18 1152 Order Status: Completed Updated: 06/06/18 1201 Narrative: o 1. oNo significant aortoiliac disease. 2. oLeft lower extremity: Flush occlusion of the SFA with reconstitution via extensive collaterals from the profunda distally. oTwo-vessel runoff below the knee via the anterior and posterior tibial arteries. 3. oRight lower extremity: Diffuse SFA disease proximally followed by a total occlusion in the midsegment with reconstitution distally via extensive collaterals from the profunda. oOne-vessel runoff below the knee via the posterior tibial artery. Recommendations: Recommend maximizing medical therapy as the patient has good collaterals. o I am going to start her on small dose cilostazol for symptomatic improvement. ADMISSION 08/21/2019 This is a 58 year old woman with type 2 diabetes and known PAD. She had an angiogram in March 2020 by Dr. Fletcher Anon the results of which are shown above. At that time she had been seeing Dr. Amalia Hailey of podiatry with pain and swelling in her left foot she was felt to have capsulitis. She has been using a cam boot. She came to the attention of Dr. Fletcher Anon in March 2020 after noninvasive studies done on 05/09/2018 showed an ABI on the right of 0.63 with a TBI of 0.21 biphasic waveforms on the left 0.98 ABI with a TBI of 0.16 monophasic waveforms. Her ABIs were felt to be falsely elevated. She went on to have angiography on 06/06/2018. The results of this are above. She was felt to  have good collaterals at that time she did not have any wounds. She had an MRI of the foot and 09/13/2018 that showed marrow edema in the distal calcaneus throughout the midfoot and in the visualized portion of the second and third metatarsals likely due to stress changeo Plantar fasciitis. No fracture or infection The patient states that she has had a wound on the left second toe/callus over the last month. She was seen in the ER and on 2 occasions in late May. On the first she had had cephalexin and doxycycline. She was not having any improvement in the erythema on the dorsal foot which per her description went up to the left medial ankle. On 5/28 she was started on Bactrim which she is in the middle of taking. She has had good improvement in the degree of erythema. Also 2 weeks ago she had a pedicure and she had some bleeding from the first and second toes which became discolored. This actually looks somewhat better than the picture from the ER on 5/28. Past medical history includes type 2 diabetes with known PAD 2. Barrett's esophagus 3. Hypertension 4. Hyperlipidemia ABIs in our clinic were 0.74 on the right and 0.86 on the left. She does not really describe claudication that I can elicit. She is still fairly active 6/9; patient comes back to clinic today for follow-up of the ulcer over her left fifth PIP on the left foot. Culture I did last week showed methicillin sensitive staph aureus which should have been sensitive to the Bactrim DS she was already put on by her primary physician. Noteworthy that she did not tolerate the Bactrim DS very well because of nausea and vomiting. She is allergic to penicillin. She is  using silver alginate on the wound. For 1 reason or another we were not able to arrange a follow-up with Dr. Fletcher Anon to follow-up on her PAD. We will retry to resend the information 6/23; the patient has apparently seen Dr. Fletcher Anon yesterday and has an angiogram booked for next Wednesday.  The ulcer on the dorsal aspect of her left fifth toe looks better less of a purulent surface. Still not completely viable. She cultured MSSA 7/7; the patient's angiogram on 6/30 showed flush occlusion of the last SFA this reconstitutes distally via extensive collaterals from the profunda and two-vessel runoff below the knee via the anterior and posterior tibial arteries. This does not look too much different from her previous study. It was felt that the best way to revascularize would be a femoral to above-knee popliteal artery bypass. He made the comment that as the left toe is improving this might not be needed however the patient clearly describes "2 grocery isle" claudication when she is at the grocery store. She does not have symptoms at rest. She is a non-smoker 7/14; left fifth toe. Using endoform. Not much improvement today. She sees Dr. Fletcher Anon again on 7/27. I am assuming he will consider referring her to vascular surgery if he still thinks bypass is necessary 7/21; left fifth toe. No major change sees Dr. Fletcher Anon again on 7/27. Question is consideration of referral for vascular surgery. I change the primary dressing to Iodoflex 7/28 left fifth toe. Generally looks better. Cleaner surface slightly smaller. Using Iodoflex as of last week. I reviewed the note from yesterday by Dr. Fletcher Anon. Noted that she had flush occlusion of the SFA with reconstitution via extensive collaterals from the profunda with two-vessel runoff below the knee. She was felt to be a candidate for bypass surgery she is therefore been referred by Dr. Richardo Priest, Julie Prince ([id number]) to vein and vascular but at this point I am not sure which practice. She has a nonhealing wound on the left fifth toe and roughly 2-3 grocery store isle claudication Electronic Signature(s) Signed: 10/16/2019 3:56:44 PM By: Linton Ham MD Entered By: Linton Ham on 10/16/2019 15:51:16 [place], [place]  ([id number]) -------------------------------------------------------------------------------- Physical Exam Details Patient Name: Julie Prince Date of Service: 10/16/2019 3:30 PM Medical Record Number: [id number] Patient Account Number: [id number] Date of Birth/Sex: [birth year] (59 y.o. F) Treating RN: Cornell Barman Primary Care Provider: [name] Other Clinician: Referring Provider: [name] Treating Provider/Extender: Tito Dine in Treatment: 8 Constitutional Patient is hypertensive.. Pulse regular and within target range for patient.Marland Kitchen Respirations regular, non-labored and within target range.. Temperature is normal and within the target range for the patient.Marland Kitchen appears in no distress. Cardiovascular pedal pusle not palpable on the left. Notes Wound exam 6 the area in question is on the dorsal aspect of the left fifth toe. No debridement is required. Tissue at the base looks healthy. Slightly better surface area measurements. There is no evidence of surrounding infection Electronic Signature(s) Signed: 10/16/2019 3:56:44 PM By: Linton Ham MD Entered By: Linton Ham on 10/16/2019 15:53:07 [place], [place] ([id number]) -------------------------------------------------------------------------------- Physician Orders Details Patient Name: Julie Prince Date of Service: 10/16/2019 3:30 PM Medical Record Number: [id number] Patient Account Number: [id number] Date of Birth/Sex: January 30, 1961 (58 y.o. F) Treating RN: Grover Canavan Primary Care Provider: [name] Other Clinician: Referring Provider: [name] Treating Provider/Extender: Tito Dine in Treatment: 8 Verbal / Phone Orders: No Diagnosis Coding Wound Cleansing Wound #1 Left Toe Fifth o Clean wound with Normal Saline. o Dial antibacterial  soap, wash wounds, rinse and pat dry prior to dressing wounds o No tub bath. Anesthetic (add to Medication List) Wound #1 Left  Toe Fifth o Topical Lidocaine 4% cream applied to wound bed prior to debridement (In Clinic Only). Primary Wound Dressing Wound #1 Left Toe Fifth o Iodoflex Secondary Dressing Wound #1 Left Toe Fifth o Other - Coverlet Dressing Change Frequency Wound #1 Left Toe Fifth o Change dressing every other day. Follow-up Appointments Wound #1 Left Toe Fifth o Return Appointment in 2 weeks. Off-Loading Wound #1 Left Toe Fifth o Other: Additional Orders / Instructions Wound #1 Left Toe Fifth o Increase protein intake. Electronic Signature(s) Signed: 10/16/2019 3:56:44 PM By: Linton Ham MD Signed: 10/16/2019 4:16:31 PM By: Grover Canavan Entered By: Grover Canavan on 10/16/2019 15:47:35 [place], [place] ([id number]) -------------------------------------------------------------------------------- Problem List Details Patient Name: Julie Prince Date of Service: 10/16/2019 3:30 PM Medical Record Number: [id number] Patient Account Number: [id number] Date of Birth/Sex: 1961/01/24 (58 y.o. F) Treating RN: Cornell Barman Primary Care Provider: [name] Other Clinician: Referring Provider: [name] Treating Provider/Extender: Tito Dine in Treatment: 8 Active Problems ICD-10 Encounter Code Description Active Date MDM Diagnosis E11.621 Type 2 diabetes mellitus with foot ulcer 08/21/2019 No Yes L97.528 Non-pressure chronic ulcer of other part of left foot with other specified 08/21/2019 No Yes severity E11.51 Type 2 diabetes mellitus with diabetic peripheral angiopathy without 08/21/2019 No Yes gangrene L03.116 Cellulitis of left lower limb 08/21/2019 No Yes A49.01 Methicillin susceptible Staphylococcus aureus infection, unspecified site 08/28/2019 No Yes Inactive Problems Resolved Problems Electronic Signature(s) Signed: 10/16/2019 3:56:44 PM By: Linton Ham MD Entered By: Linton Ham on 10/16/2019 15:48:41 [place], [place]  ([id number]) -------------------------------------------------------------------------------- Progress Note Details Patient Name: Julie Prince Date of Service: 10/16/2019 3:30 PM Medical Record Number: [id number] Patient Account Number: [id number] Date of Birth/Sex: Dec 26, 1960 (59 y.o. F) Treating RN: Cornell Barman Primary Care Provider: [name] Other Clinician: Referring Provider: [name] Treating Provider/Extender: Ricard Dillon Weeks in Treatment: 8 Subjective History of Present Illness (HPI) Resulted: 06/06/18 1152 Order Status: Completed Updated: 06/06/18 1201 Narrative: 1. No significant aortoiliac disease. 2. Left lower extremity: Flush occlusion of the SFA with reconstitution via extensive collaterals from the profunda distally. Two-vessel runoff below the knee via the anterior and posterior tibial arteries. 3. 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 One-vessel runoff below the knee via the posterior tibial artery. Recommendations: Recommend maximizing medical therapy as the patient has good collaterals. I am going to start her on small dose cilostazol for symptomatic improvement. ADMISSION 08/21/2019 This is a 59 year old woman with type 2 diabetes and known PAD. She had an angiogram in March 2020 by Dr. Fletcher Anon the results of which are shown above. At that time she had been seeing Dr. Amalia Hailey of podiatry with pain and swelling in her left foot she was felt to have capsulitis. She has been using a cam boot. She came to the attention of Dr. Fletcher Anon in March 2020 after noninvasive studies done on 05/09/2018 showed an ABI on the right of 0.63 with a TBI of 0.21 biphasic waveforms on the left 0.98 ABI with a TBI of 0.16 monophasic waveforms. Her ABIs were felt to be falsely elevated. She went on to have angiography on 06/06/2018. The  results of this are above. She was felt to have good collaterals at that time she did not have any wounds. She had an MRI  of the foot and 09/13/2018 that showed marrow edema in the distal calcaneus throughout the midfoot and in the visualized portion of the second and third metatarsals likely due to stress changeo Plantar fasciitis. No fracture or infection The patient states that she has had a wound on the left second toe/callus over the last month. She was seen in the ER and on 2 occasions in late May. On the first she had had cephalexin and doxycycline. She was not having any improvement in the erythema on the dorsal foot which per her description went up to the left medial ankle. On 5/28 she was started on Bactrim which she is in the middle of taking. She has had good improvement in the degree of erythema. Also 2 weeks ago she had a pedicure and she had some bleeding from the first and second toes which became discolored. This actually looks somewhat better than the picture from the ER on 5/28. Past medical history includes type 2 diabetes with known PAD 2. Barrett's esophagus 3. Hypertension 4. Hyperlipidemia ABIs in our clinic were 0.74 on the right and 0.86 on the left. She does not really describe claudication that I can elicit. She is still fairly active 6/9; patient comes back to clinic today for follow-up of the ulcer over her left fifth PIP on the left foot. Culture I did last week showed methicillin sensitive staph aureus which should have been sensitive to the Bactrim DS she was already put on by her primary physician. Noteworthy that she did not tolerate the Bactrim DS very well because of nausea and vomiting. She is allergic to penicillin. She is using silver alginate on the wound. For 1 reason or another we were not able to arrange a follow-up with Dr. Fletcher Anon to follow-up on her PAD. We will retry to resend the information 6/23; the patient has apparently seen Dr. Fletcher Anon yesterday and  has an angiogram booked for next Wednesday. The ulcer on the dorsal aspect of her left fifth toe looks better less of a purulent surface. Still not completely viable. She cultured MSSA 7/7; the patient's angiogram on 6/30 showed flush occlusion of the last SFA this reconstitutes distally via extensive collaterals from the profunda and two-vessel runoff below the knee via the anterior and posterior tibial arteries. This does not look too much different from her previous study. It was felt that the best way to revascularize would be a femoral to above-knee popliteal artery bypass. He made the comment that as the left toe is improving this might not be needed however the patient clearly describes "2 grocery isle" claudication when she is at the grocery store. She does not have symptoms at rest. She is a non-smoker 7/14; left fifth toe. Using endoform. Not much improvement today. She sees Dr. Fletcher Anon again on 7/27. I am assuming he will consider referring her to vascular surgery if he still thinks bypass is necessary 7/21; left fifth toe. No major change sees Dr. Fletcher Anon again on 7/27. Question is consideration of referral for vascular surgery. I change the primary dressing to Iodoflex 7/28 left fifth toe. Generally looks better. Cleaner surface slightly smaller. Using Iodoflex as of last week. I reviewed the note from yesterday by Dr. Fletcher Anon. Noted that she had flush occlusion of the SFA with reconstitution via extensive collaterals from the profunda with two-vessel runoff below the knee. She was felt to be a candidate for bypass surgery she is therefore been referred by Dr. Fletcher Anon to vein and vascular but at  this point I am not sure which practice. She has a nonhealing wound on the left fifth toe and roughly 2-3 grocery store isle claudication [place], [place] ([id number]) Objective Constitutional Patient is hypertensive.. Pulse regular and within target range for patient.Marland Kitchen Respirations regular, non-labored  and within target range.. Temperature is normal and within the target range for the patient.Marland Kitchen appears in no distress. Vitals Time Taken: 3:25 AM, Height: 64 in, Weight: 210 lbs, BMI: 36, Temperature: 98.2 F, Respiratory Rate: 18 breaths/min, Blood Pressure: 158/72 mmHg. Cardiovascular pedal pusle not palpable on the left. General Notes: Wound exam 6 the area in question is on the dorsal aspect of the left fifth toe. No debridement is required. Tissue at the base looks healthy. Slightly better surface area measurements. There is no evidence of surrounding infection Integumentary (Hair, Skin) Wound #1 status is Open. Original cause of wound was Blister. The wound is located on the Left Toe Fifth. The wound measures 0.5cm length x 0.3cm width x 0.2cm depth; 0.118cm^2 area and 0.024cm^3 volume. There is Fat Layer (Subcutaneous Tissue) Exposed exposed. There is a medium amount of serous drainage noted. The wound margin is flat and intact. There is medium (34-66%) pink granulation within the wound bed. There is a medium (34-66%) amount of necrotic tissue within the wound bed including Adherent Slough. Assessment Active Problems ICD-10 Type 2 diabetes mellitus with foot ulcer Non-pressure chronic ulcer of other part of left foot with other specified severity Type 2 diabetes mellitus with diabetic peripheral angiopathy without gangrene Cellulitis of left lower limb Methicillin susceptible Staphylococcus aureus infection, unspecified site Plan Wound Cleansing: Wound #1 Left Toe Fifth: Clean wound with Normal Saline. Dial antibacterial soap, wash wounds, rinse and pat dry prior to dressing wounds No tub bath. Anesthetic (add to Medication List): Wound #1 Left Toe Fifth: Topical Lidocaine 4% cream applied to wound bed prior to debridement (In Clinic Only). Primary Wound Dressing: Wound #1 Left Toe Fifth: Iodoflex Secondary Dressing: Wound #1 Left Toe Fifth: Other - Coverlet Dressing Change  Frequency: Wound #1 Left Toe Fifth: Change dressing every other day. Follow-up Appointments: Wound #1 Left Toe Fifth: Return Appointment in 2 weeks. Off-Loading: Wound #1 Left Toe Fifth: Other: Additional Orders / Instructions: [place], [place] ([id number]) Wound #1 Left Toe Fifth: Increase protein intake. 1 continue with iodoflex 2 await V+V appt Electronic Signature(s) Signed: 10/16/2019 3:56:44 PM By: Linton Ham MD Entered By: Linton Ham on 10/16/2019 15:54:03 [place], [place] ([id number]) -------------------------------------------------------------------------------- SuperBill Details Patient Name: Julie Prince Date of Service: 10/16/2019 Medical Record Number: [id number] Patient Account Number: [id number] Date of Birth/Sex: 09/04/1960 (58 y.o. F) Treating RN: Grover Canavan Primary Care Provider: [name] Other Clinician: Referring Provider: [name] Treating Provider/Extender: Tito Dine in Treatment: 8 Diagnosis Coding ICD-10 Codes Code Description E11.621 Type 2 diabetes mellitus with foot ulcer L97.528 Non-pressure chronic ulcer of other part of left foot with other specified severity E11.51 Type 2 diabetes mellitus with diabetic peripheral angiopathy without gangrene L03.116 Cellulitis of left lower limb A49.01 Methicillin susceptible Staphylococcus aureus infection, unspecified site Facility Procedures CPT4 Code: [id number] Description: [id number] - WOUND CARE VISIT-LEV 2 EST PT Modifier: Quantity: 1 Physician Procedures CPT4 Code: 1157262 Description: 03559 - WC PHYS LEVEL 3 - EST PT Modifier: Quantity: 1 CPT4 Code: Description: ICD-10 Diagnosis Description E11.621 Type 2 diabetes mellitus with foot ulcer L97.528 Non-pressure chronic ulcer of other part of left foot with other specifie Modifier: d severity Quantity: Electronic Signature(s) Signed: 10/16/2019 3:56:44 PM By: Linton Ham MD Entered  By: Linton Ham on  10/16/2019 15:54:54

## 2019-10-17 NOTE — Progress Notes (Signed)
[place], [place] ([id number]) Visit Report for 10/16/2019 Arrival Information Details Patient Name: Julie Prince, Julie Prince Date of Service: 10/16/2019 3:30 PM Medical Record Number: [id number] Patient Account Number: [id number] Date of Birth/Sex: [birth year] (59 y.o. F) Treating RN: Julie Prince Primary Care Dwyane Dupree: Julie Prince Other Clinician: Referring Julie Prince: Julie Prince Treating Julie Prince/Extender: Julie Prince in Treatment: 8 Visit Information History Since Last Visit All ordered tests and consults were completed: No Patient Arrived: Ambulatory Added or deleted any medications: No Arrival Time: 15:30 Any new allergies or adverse reactions: No Accompanied By: self Had a fall or experienced change in No Transfer Assistance: None activities of daily living that may affect Patient Identification Verified: Yes risk of falls: Secondary Verification Process Completed: Yes Signs or symptoms of abuse/neglect since last visito No Patient Requires Transmission-Based Precautions: No Hospitalized since last visit: No Patient Has Alerts: Yes Implantable device outside of the clinic excluding No Patient Alerts: DMII cellular tissue based products placed in the center since last visit: Pain Present Now: No Electronic Signature(s) Signed: 10/17/2019 4:44:30 PM By: Julie Prince Entered By: Julie Prince on 10/16/2019 15:30:58 Julie Prince, Julie Prince ([id number]) -------------------------------------------------------------------------------- Clinic Level of Care Assessment Details Patient Name: Julie Prince Date of Service: 10/16/2019 3:30 PM Medical Record Number: [id number] Patient Account Number: [id number] Date of Birth/Sex: 1960-06-12 (59 y.o. F) Treating RN: Julie Prince Primary Care Julie Prince: Julie Prince Other Clinician: Referring Julie Prince: Julie Prince Treating Julie Prince/Extender: Julie Prince in Treatment: 8 Clinic Level of Care Assessment  Items TOOL 4 Quantity Score []  - Use when only an EandM is performed on FOLLOW-UP visit 0 ASSESSMENTS - Nursing Assessment / Reassessment X - Reassessment of Co-morbidities (includes updates in patient status) 1 10 X- 1 5 Reassessment of Adherence to Treatment Plan ASSESSMENTS - Wound and Skin Assessment / Reassessment X - Simple Wound Assessment / Reassessment - one wound 1 5 []  - 0 Complex Wound Assessment / Reassessment - multiple wounds []  - 0 Dermatologic / Skin Assessment (not related to wound area) ASSESSMENTS - Focused Assessment []  - Circumferential Edema Measurements - multi extremities 0 []  - 0 Nutritional Assessment / Counseling / Intervention X- 1 5 Lower Extremity Assessment (monofilament, tuning fork, pulses) []  - 0 Peripheral Arterial Disease Assessment (using hand held doppler) ASSESSMENTS - Ostomy and/or Continence Assessment and Care []  - Incontinence Assessment and Management 0 []  - 0 Ostomy Care Assessment and Management (repouching, etc.) PROCESS - Coordination of Care X - Simple Patient / Family Education for ongoing care 1 15 []  - 0 Complex (extensive) Patient / Family Education for ongoing care []  - 0 Staff obtains Programmer, systems, Records, Test Results / Process Orders []  - 0 Staff telephones HHA, Nursing Homes / Clarify orders / etc []  - 0 Routine Transfer to another Facility (non-emergent condition) []  - 0 Routine Hospital Admission (non-emergent condition) []  - 0 New Admissions / Biomedical engineer / Ordering NPWT, Apligraf, etc. []  - 0 Emergency Hospital Admission (emergent condition) X- 1 10 Simple Discharge Coordination []  - 0 Complex (extensive) Discharge Coordination PROCESS - Special Needs []  - Pediatric / Minor Patient Management 0 []  - 0 Isolation Patient Management []  - 0 Hearing / Language / Visual special needs []  - 0 Assessment of Community assistance (transportation, D/C planning, etc.) []  - 0 Additional assistance /  Altered mentation []  - 0 Support Surface(s) Assessment (bed, cushion, seat, etc.) INTERVENTIONS - Wound Cleansing / Measurement [place], Julie Prince ([id number]) X- 1 5 Simple Wound Cleansing - one wound []  - 0 Complex Wound Cleansing -  multiple wounds []  - 0 Wound Imaging (photographs - any number of wounds) []  - 0 Wound Tracing (instead of photographs) X- 1 5 Simple Wound Measurement - one wound []  - 0 Complex Wound Measurement - multiple wounds INTERVENTIONS - Wound Dressings X - Small Wound Dressing one or multiple wounds 1 10 []  - 0 Medium Wound Dressing one or multiple wounds []  - 0 Large Wound Dressing one or multiple wounds []  - 0 Application of Medications - topical []  - 0 Application of Medications - injection INTERVENTIONS - Miscellaneous []  - External ear exam 0 []  - 0 Specimen Collection (cultures, biopsies, blood, body fluids, etc.) []  - 0 Specimen(s) / Culture(s) sent or taken to Lab for analysis []  - 0 Patient Transfer (multiple staff / Civil Service fast streamer / Similar devices) []  - 0 Simple Staple / Suture removal (25 or less) []  - 0 Complex Staple / Suture removal (26 or more) []  - 0 Hypo / Hyperglycemic Management (close monitor of Blood Glucose) []  - 0 Ankle / Brachial Index (ABI) - do not check if billed separately X- 1 5 Vital Signs Has the patient been seen at the hospital within the last three years: Yes Total Score: 75 Level Of Care: New/Established - Level 2 Electronic Signature(s) Signed: 10/16/2019 4:16:31 PM By: Julie Prince Entered By: Julie Prince on 10/16/2019 15:49:56 [place], Julie Prince ([id number]) -------------------------------------------------------------------------------- Encounter Discharge Information Details Patient Name: Julie Prince Date of Service: 10/16/2019 3:30 PM Medical Record Number: [id number] Patient Account Number: [id number] Date of Birth/Sex: 1961-01-05 (59 y.o. F) Treating RN: Julie Prince Primary Care Julie Prince:  Julie Prince Other Clinician: Referring Julie Prince: Julie Prince Treating Julie Prince/Extender: Julie Prince in Treatment: 8 Encounter Discharge Information Items Discharge Condition: Stable Ambulatory Status: Ambulatory Discharge Destination: Home Transportation: Private Auto Accompanied By: self Schedule Follow-up Appointment: Yes Clinical Summary of Care: Electronic Signature(s) Signed: 10/16/2019 4:16:31 PM By: Julie Prince Entered By: Julie Prince on 10/16/2019 15:51:39 [place], Azaylea ([id number]) -------------------------------------------------------------------------------- Lower Extremity Assessment Details Patient Name: Julie Prince Date of Service: 10/16/2019 3:30 PM Medical Record Number: [id number] Patient Account Number: [id number] Date of Birth/Sex: 1960/05/11 (59 y.o. F) Treating RN: Julie Prince Primary Care Lancelot Alyea: Julie Prince Other Clinician: Referring Tallie Hevia: Julie Prince Treating Berlinda Farve/Extender: Julie Prince in Treatment: 8 Electronic Signature(s) Signed: 10/16/2019 4:31:05 PM By: Gretta Cool, BSN, RN, CWS, [name] RN, BSN Previous Signature: 10/16/2019 4:30:57 PM Version By: Gretta Cool, BSN, RN, CWS, [name] RN, BSN Entered By: Gretta Cool, BSN, RN, CWS, [name] on 10/16/2019 16:31:05 [place], Lakaisha ([id number]) -------------------------------------------------------------------------------- Multi Wound Chart Details Patient Name: Julie Prince Date of Service: 10/16/2019 3:30 PM Medical Record Number: [id number] Patient Account Number: [id number] Date of Birth/Sex: 07-25-1960 (58 y.o. F) Treating RN: Julie Prince Primary Care Lorijean Husser: Julie Prince Other Clinician: Referring Jazarah Capili: Julie Prince Treating Keeanna Villafranca/Extender: Julie Prince in Treatment: 8 Vital Signs Height(in): 64 Pulse(bpm): Weight(lbs): 210 Blood Pressure(mmHg): 158/72 Body Mass Index(BMI): 36 Temperature(F): 98.2 Respiratory Rate(breaths/min):  18 Photos: [N/A:N/A] Wound Location: Left Toe Fifth N/A N/A Wounding Event: Blister N/A N/A Primary Etiology: Diabetic Wound/Ulcer of the Lower N/A N/A Extremity Comorbid History: Hypertension, Type II Diabetes N/A N/A Date Acquired: 08/07/2019 N/A N/A Weeks of Treatment: 8 N/A N/A Wound Status: Open N/A N/A Measurements L x W x D (cm) 0.5x0.3x0.2 N/A N/A Area (cm) : 0.118 N/A N/A Volume (cm) : 0.024 N/A N/A % Reduction in Area: 74.90% N/A N/A % Reduction in Volume: 74.50% N/A N/A Classification: Grade 1 N/A N/A Exudate Amount: Medium N/A N/A Exudate Type: Serous  N/A N/A Exudate Color: amber N/A N/A Wound Margin: Flat and Intact N/A N/A Granulation Amount: Medium (34-66%) N/A N/A Granulation Quality: Pink N/A N/A Necrotic Amount: Medium (34-66%) N/A N/A Exposed Structures: Fat Layer (Subcutaneous Tissue) N/A N/A Exposed: Yes Fascia: No Tendon: No Muscle: No Joint: No Bone: No Epithelialization: None N/A N/A Treatment Notes Electronic Signature(s) Signed: 10/16/2019 3:56:44 PM By: Linton Ham MD Entered By: Linton Ham on 10/16/2019 15:48:48 [place], [name] ([id number]) -------------------------------------------------------------------------------- Multi-Disciplinary Care Plan Details Patient Name: Julie Prince Date of Service: 10/16/2019 3:30 PM Medical Record Number: [id number] Patient Account Number: [id number] Date of Birth/Sex: 11-26-1960 (59 y.o. F) Treating RN: Julie Prince Primary Care Lexii Walsh: Julie Prince Other Clinician: Referring Mara Favero: Julie Prince Treating Anik Wesch/Extender: Julie Prince in Treatment: 8 Active Inactive Necrotic Tissue Nursing Diagnoses: Impaired tissue integrity related to necrotic/devitalized tissue Goals: Necrotic/devitalized tissue will be minimized in the wound bed Date Initiated: 08/21/2019 Target Resolution Date: 08/28/2019 Goal Status: Active Interventions: Assess patient pain level pre-, during  and post procedure and prior to discharge Treatment Activities: Apply topical anesthetic as ordered : 08/21/2019 Notes: Orientation to the Wound Care Program Nursing Diagnoses: Knowledge deficit related to the wound healing center program Goals: Patient/caregiver will verbalize understanding of the [facility] Date Initiated: 08/21/2019 Target Resolution Date: 08/29/2019 Goal Status: Active Interventions: Provide education on orientation to the wound center Notes: Soft Tissue Infection Nursing Diagnoses: Impaired tissue integrity Goals: Patient will remain free of wound infection Date Initiated: 08/21/2019 Target Resolution Date: 08/28/2019 Goal Status: Active Interventions: Assess signs and symptoms of infection every visit Treatment Activities: Systemic antibiotics : 08/21/2019 Notes: Tissue Oxygenation Nursing Diagnoses: Potential alteration in peripheral tissue perfusion (select prior to confirmation of diagnosis) [place], [name] ([id number]) Goals: Patient/caregiver will verbalize understanding of disease process and disease management Date Initiated: 08/21/2019 Target Resolution Date: 08/28/2019 Goal Status: Active Interventions: Assess patient understanding of disease process and management upon diagnosis and as needed Assess peripheral arterial status upon admission and as needed Provide education on tissue oxygenation and ischemia Notes: Wound/Skin Impairment Nursing Diagnoses: Impaired tissue integrity Goals: Ulcer/skin breakdown will have a volume reduction of 30% by week 4 Date Initiated: 08/21/2019 Target Resolution Date: 08/28/2019 Goal Status: Active Interventions: Provide education on ulcer and skin care Notes: Electronic Signature(s) Signed: 10/16/2019 4:16:31 PM By: Julie Prince Entered By: Julie Prince on 10/16/2019 15:44:58 [place], Thaily ([id number]) -------------------------------------------------------------------------------- Pain  Assessment Details Patient Name: Julie Prince Date of Service: 10/16/2019 3:30 PM Medical Record Number: [id number] Patient Account Number: [id number] Date of Birth/Sex: 08/19/1960 (59 y.o. F) Treating RN: Julie Prince Primary Care Germani Gavilanes: Julie Prince Other Clinician: Referring Ariyannah Pauling: Julie Prince Treating Brighten Orndoff/Extender: Julie Prince in Treatment: 8 Active Problems Location of Pain Severity and Description of Pain Patient Has Paino No Site Locations With Dressing Change: No Pain Management and Medication Current Pain Management: Electronic Signature(s) Signed: 10/16/2019 4:56:00 PM By: Gretta Cool, BSN, RN, CWS, [name] RN, BSN Signed: 10/17/2019 4:44:30 PM By: Julie Prince Entered By: Julie Prince on 10/16/2019 15:35:39 [place], Meliss ([id number]) -------------------------------------------------------------------------------- Patient/Caregiver Education Details Patient Name: Julie Prince Date of Service: 10/16/2019 3:30 PM Medical Record Number: [id number] Patient Account Number: [id number] Date of Birth/Gender: 26-Apr-1960 (58 y.o. F) Treating RN: Julie Prince Primary Care Physician: Julie Prince Other Clinician: Referring Physician: Lorrene Prince Treating Physician/Extender: Julie Prince in Treatment: 8 Education Assessment Education Provided To: Patient Education Topics Provided Wound/Skin Impairment: Handouts: Caring for Your Ulcer Methods: Explain/Verbal Responses: State content correctly Electronic Signature(s) Signed: 10/16/2019 4:16:31  PM By: Julie Prince Entered By: Julie Prince on 10/16/2019 15:50:37 [place], Raya ([id number]) -------------------------------------------------------------------------------- Wound Assessment Details Patient Name: Julie Prince Date of Service: 10/16/2019 3:30 PM Medical Record Number: [id number] Patient Account Number: [id number] Date of Birth/Sex: 1960-06-05 (59 y.o. F) Treating  RN: Julie Prince Primary Care Nykole Matos: Julie Prince Other Clinician: Referring Jayshawn Colston: Julie Prince Treating Hellena Pridgen/Extender: Julie Prince in Treatment: 8 Wound Status Wound Number: 1 Primary Etiology: Diabetic Wound/Ulcer of the Lower Extremity Wound Location: Left Toe Fifth Wound Status: Open Wounding Event: Blister Comorbid History: Hypertension, Type II Diabetes Date Acquired: 08/07/2019 Weeks Of Treatment: 8 Clustered Wound: No Photos Wound Measurements Length: (cm) 0.5 Width: (cm) 0.3 Depth: (cm) 0.2 Area: (cm) 0.118 Volume: (cm) 0.024 % Reduction in Area: 74.9% % Reduction in Volume: 74.5% Epithelialization: None Wound Description Classification: Grade 1 Wound Margin: Flat and Intact Exudate Amount: Medium Exudate Type: Serous Exudate Color: amber Foul Odor After Cleansing: No Slough/Fibrino Yes Wound Bed Granulation Amount: Medium (34-66%) Exposed Structure Granulation Quality: Pink Fascia Exposed: No Necrotic Amount: Medium (34-66%) Fat Layer (Subcutaneous Tissue) Exposed: Yes Necrotic Quality: Adherent Slough Tendon Exposed: No Muscle Exposed: No Joint Exposed: No Bone Exposed: No Treatment Notes Wound #1 (Left Toe Fifth) Notes Iodoflex, coverlet Electronic Signature(s) Signed: 10/16/2019 4:56:00 PM By: Gretta Cool, BSN, RN, CWS, [name] RN, BSN [place], [place] ([id number]) Signed: 10/17/2019 4:44:30 PM By: Julie Prince Entered By: Julie Prince on 10/16/2019 15:33:42 [place], Carey ([id number]) -------------------------------------------------------------------------------- Vitals Details Patient Name: Julie Prince Date of Service: 10/16/2019 3:30 PM Medical Record Number: [id number] Patient Account Number: [id number] Date of Birth/Sex: [birth year] (58 y.o. F) Treating RN: Julie Prince Primary Care Yakelin Grenier: Julie Prince Other Clinician: Referring Sabree Nuon: Julie Prince Treating Cyanne Delmar/Extender: Julie Prince in  Treatment: 8 Vital Signs Time Taken: 03:25 Temperature (F): 98.2 Height (in): 64 Respiratory Rate (breaths/min): 18 Weight (lbs): 210 Blood Pressure (mmHg): 158/72 Body Mass Index (BMI): 36 Reference Range: 80 - 120 mg / dl Electronic Signature(s) Signed: 10/17/2019 4:44:30 PM By: Julie Prince Entered By: Julie Prince on 10/16/2019 15:34:34

## 2019-10-23 ENCOUNTER — Ambulatory Visit: Payer: 59

## 2019-10-23 ENCOUNTER — Ambulatory Visit: Payer: Self-pay

## 2019-10-30 ENCOUNTER — Other Ambulatory Visit: Payer: Self-pay

## 2019-10-30 ENCOUNTER — Encounter: Payer: 59 | Attending: Internal Medicine | Admitting: Internal Medicine

## 2019-10-30 DIAGNOSIS — I10 Essential (primary) hypertension: Secondary | ICD-10-CM | POA: Insufficient documentation

## 2019-10-30 DIAGNOSIS — E11621 Type 2 diabetes mellitus with foot ulcer: Secondary | ICD-10-CM | POA: Diagnosis not present

## 2019-10-30 DIAGNOSIS — E1151 Type 2 diabetes mellitus with diabetic peripheral angiopathy without gangrene: Secondary | ICD-10-CM | POA: Insufficient documentation

## 2019-10-30 DIAGNOSIS — L03116 Cellulitis of left lower limb: Secondary | ICD-10-CM | POA: Diagnosis not present

## 2019-10-30 DIAGNOSIS — L97528 Non-pressure chronic ulcer of other part of left foot with other specified severity: Secondary | ICD-10-CM | POA: Insufficient documentation

## 2019-10-31 NOTE — Progress Notes (Signed)
[place], [place] ([id number]) Visit Report for 10/30/2019 Arrival Information Details Patient Name: Julie Prince, Julie Prince Date of Service: 10/30/2019 8:15 AM Medical Record Number: [id number] Patient Account Number: [id number] Date of Birth/Sex: 1960/10/15 (59 y.o. F) Treating RN: Army Melia Primary Care Burnadette Baskett: [name] Other Clinician: Referring Jaelyne Deeg: [name] Treating Valisha Heslin/Extender: Beverly Gust in Treatment: 10 Visit Information History Since Last Visit Added or deleted any medications: No Patient Arrived: Ambulatory Any new allergies or adverse reactions: No Arrival Time: 08:19 Had a fall or experienced change in No Accompanied By: daughter activities of daily living that may affect Transfer Assistance: None risk of falls: Patient Identification Verified: Yes Signs or symptoms of abuse/neglect since last visito No Patient Requires Transmission-Based Precautions: No Hospitalized since last visit: No Patient Has Alerts: Yes Has Dressing in Place as Prescribed: Yes Patient Alerts: DMII Pain Present Now: Yes Electronic Signature(s) Signed: 10/31/2019 8:31:51 AM By: Army Melia Entered By: Army Melia on 10/30/2019 08:19:36 [place], [place] ([id number]) -------------------------------------------------------------------------------- Encounter Discharge Information Details Patient Name: Julie Prince Date of Service: 10/30/2019 8:15 AM Medical Record Number: [id number] Patient Account Number: [id number] Date of Birth/Sex: 07/31/1960 (58 y.o. F) Treating RN: Army Melia Primary Care Osman Calzadilla: [name] Other Clinician: Referring Fendi Meinhardt: [name] Treating Lerone Onder/Extender: Beverly Gust in Treatment: 10 Encounter Discharge Information Items Post Procedure Vitals Discharge Condition: Stable Temperature (F): 98.2 Ambulatory Status: Ambulatory Pulse (bpm): 72 Discharge Destination: Home Respiratory Rate (breaths/min):  16 Transportation: Private Auto Blood Pressure (mmHg): 157/72 Accompanied By: daughter Schedule Follow-up Appointment: No Clinical Summary of Care: Electronic Signature(s) Signed: 10/31/2019 8:31:51 AM By: Army Melia Entered By: Army Melia on 10/30/2019 08:51:03 [place], [place] ([id number]) -------------------------------------------------------------------------------- Lower Extremity Assessment Details Patient Name: Julie Prince Date of Service: 10/30/2019 8:15 AM Medical Record Number: [id number] Patient Account Number: [id number] Date of Birth/Sex: 30-Jun-1960 (59 y.o. F) Treating RN: Army Melia Primary Care Amala Petion: [name] Other Clinician: Referring Judy Pollman: [name] Treating Clevon Khader/Extender: Beverly Gust in Treatment: 10 Edema Assessment Assessed: [Left: No] [Right: No] Edema: [Left: N] [Right: o] Vascular Assessment Pulses: Dorsalis Pedis Palpable: [Left:Yes] Electronic Signature(s) Signed: 10/31/2019 8:31:51 AM By: Army Melia Entered By: Army Melia on 10/30/2019 08:24:01 [place], Alexxis ([id number]) -------------------------------------------------------------------------------- Multi Wound Chart Details Patient Name: Julie Prince Date of Service: 10/30/2019 8:15 AM Medical Record Number: [id number] Patient Account Number: [id number] Date of Birth/Sex: Apr 07, 1960 (59 y.o. F) Treating RN: Army Melia Primary Care Alanda Colton: [name] Other Clinician: Referring Daud Cayer: [name] Treating Dontavia Brand/Extender: Beverly Gust in Treatment: 10 Vital Signs Height(in): 64 Pulse(bpm): 90 Weight(lbs): 210 Blood Pressure(mmHg): 157/72 Body Mass Index(BMI): 36 Temperature(F): 98.2 Respiratory Rate(breaths/min): 16 Photos: [N/A:N/A] Wound Location: Left Toe Fifth N/A N/A Wounding Event: Blister N/A N/A Primary Etiology: Diabetic Wound/Ulcer of the Lower N/A N/A Extremity Comorbid History: Hypertension, Type II  Diabetes N/A N/A Date Acquired: 08/07/2019 N/A N/A Weeks of Treatment: 10 N/A N/A Wound Status: Open N/A N/A Measurements L x W x D (cm) 0.7x0.7x0.3 N/A N/A Area (cm) : 0.385 N/A N/A Volume (cm) : 0.115 N/A N/A % Reduction in Area: 18.30% N/A N/A % Reduction in Volume: -22.30% N/A N/A Classification: Grade 1 N/A N/A Exudate Amount: Medium N/A N/A Exudate Type: Serous N/A N/A Exudate Color: amber N/A N/A Wound Margin: Flat and Intact N/A N/A Granulation Amount: Medium (34-66%) N/A N/A Granulation Quality: Pink N/A N/A Necrotic Amount: Medium (34-66%) N/A N/A Exposed Structures: Fat Layer (Subcutaneous Tissue) N/A N/A Exposed: Yes Fascia: No Tendon: No Muscle: No Joint: No Bone: No Epithelialization: None N/A  N/A Treatment Notes Electronic Signature(s) Signed: 10/31/2019 8:31:51 AM By: Army Melia Entered By: Army Melia on 10/30/2019 08:47:27 [place], Chrystine ([id number]) -------------------------------------------------------------------------------- Multi-Disciplinary Care Plan Details Patient Name: Julie Prince Date of Service: 10/30/2019 8:15 AM Medical Record Number: [id number] Patient Account Number: [id number] Date of Birth/Sex: Nov 05, 1960 (59 y.o. F) Treating RN: Army Melia Primary Care Murl Golladay: [name] Other Clinician: Referring Yassin Scales: [name] Treating Tymia Streb/Extender: Beverly Gust in Treatment: 10 Active Inactive Necrotic Tissue Nursing Diagnoses: Impaired tissue integrity related to necrotic/devitalized tissue Goals: Necrotic/devitalized tissue will be minimized in the wound bed Date Initiated: 08/21/2019 Target Resolution Date: 08/28/2019 Goal Status: Active Interventions: Assess patient pain level pre-, during and post procedure and prior to discharge Treatment Activities: Apply topical anesthetic as ordered : 08/21/2019 Notes: Orientation to the Wound Care Program Nursing Diagnoses: Knowledge deficit related to the wound  healing center program Goals: Patient/caregiver will verbalize understanding of the [facility] Date Initiated: 08/21/2019 Target Resolution Date: 08/29/2019 Goal Status: Active Interventions: Provide education on orientation to the wound center Notes: Soft Tissue Infection Nursing Diagnoses: Impaired tissue integrity Goals: Patient will remain free of wound infection Date Initiated: 08/21/2019 Target Resolution Date: 08/28/2019 Goal Status: Active Interventions: Assess signs and symptoms of infection every visit Treatment Activities: Systemic antibiotics : 08/21/2019 Notes: Tissue Oxygenation Nursing Diagnoses: Potential alteration in peripheral tissue perfusion (select prior to confirmation of diagnosis) [employer], [name] ([id number]) Goals: Patient/caregiver will verbalize understanding of disease process and disease management Date Initiated: 08/21/2019 Target Resolution Date: 08/28/2019 Goal Status: Active Interventions: Assess patient understanding of disease process and management upon diagnosis and as needed Assess peripheral arterial status upon admission and as needed Provide education on tissue oxygenation and ischemia Notes: Wound/Skin Impairment Nursing Diagnoses: Impaired tissue integrity Goals: Ulcer/skin breakdown will have a volume reduction of 30% by week 4 Date Initiated: 08/21/2019 Target Resolution Date: 08/28/2019 Goal Status: Active Interventions: Provide education on ulcer and skin care Notes: Electronic Signature(s) Signed: 10/31/2019 8:31:51 AM By: Army Melia Entered By: Army Melia on 10/30/2019 08:47:10 [place], Concetta ([id number]) -------------------------------------------------------------------------------- Pain Assessment Details Patient Name: Julie Prince Date of Service: 10/30/2019 8:15 AM Medical Record Number: [id number] Patient Account Number: [id number] Date of Birth/Sex: 1960-10-29 (59 y.o. F) Treating RN: Army Melia Primary Care Jas Betten: [name] Other Clinician: Referring Sulamita Lafountain: [name] Treating Carrington Olazabal/Extender: Beverly Gust in Treatment: 10 Active Problems Location of Pain Severity and Description of Pain Patient Has Paino Yes Site Locations Pain Location: Pain in Ulcers Rate the pain. Current Pain Level: 3 Pain Management and Medication Current Pain Management: Electronic Signature(s) Signed: 10/31/2019 8:31:51 AM By: Army Melia Entered By: Army Melia on 10/30/2019 08:19:59 [place], Elyshia ([id number]) -------------------------------------------------------------------------------- Patient/Caregiver Education Details Patient Name: Julie Prince Date of Service: 10/30/2019 8:15 AM Medical Record Number: [id number] Patient Account Number: [id number] Date of Birth/Gender: January 14, 1961 (59 y.o. F) Treating RN: Army Melia Primary Care Physician: [name] Other Clinician: Referring Physician: [name] Treating Physician/Extender: Beverly Gust in Treatment: 10 Education Assessment Education Provided To: Patient Education Topics Provided Wound/Skin Impairment: Handouts: Caring for Your Ulcer Methods: Demonstration, Explain/Verbal Responses: State content correctly Electronic Signature(s) Signed: 10/31/2019 8:31:51 AM By: Army Melia Entered By: Army Melia on 10/30/2019 08:50:27 [place], Ver[place] ([id number]) -------------------------------------------------------------------------------- Wound Assessment Details Patient Name: Julie Prince Date of Service: 10/30/2019 8:15 AM Medical Record Number: [id number] Patient Account Number: [id number] Date of Birth/Sex: 11/20/1960 (60 y.o. F) Treating RN: Army Melia Primary Care Sidney Kann: [name] Other Clinician: Referring Trek Kimball: [name] Treating Kory Rains/Extender: Evette Doffing  Murthy Weeks in Treatment: 10 Wound Status Wound Number: 1 Primary Etiology: Diabetic  Wound/Ulcer of the Lower Extremity Wound Location: Left Toe Fifth Wound Status: Open Wounding Event: Blister Comorbid History: Hypertension, Type II Diabetes Date Acquired: 08/07/2019 Weeks Of Treatment: 10 Clustered Wound: No Photos Wound Measurements Length: (cm) 0.7 % Re Width: (cm) 0.7 % Re Depth: (cm) 0.3 Epit Area: (cm) 0.385 Volume: (cm) 0.115 duction in Area: 18.3% duction in Volume: -22.3% helialization: None Wound Description Classification: Grade 1 Fou Wound Margin: Flat and Intact Slo Exudate Amount: Medium Exudate Type: Serous Exudate Color: amber l Odor After Cleansing: No ugh/Fibrino Yes Wound Bed Granulation Amount: Medium (34-66%) Exposed Structure Granulation Quality: Pink Fascia Exposed: No Necrotic Amount: Medium (34-66%) Fat Layer (Subcutaneous Tissue) Exposed: Yes Necrotic Quality: Adherent Slough Tendon Exposed: No Muscle Exposed: No Joint Exposed: No Bone Exposed: No Treatment Notes Wound #1 (Left Toe Fifth) Notes Iodoflex, coverlet Electronic Signature(s) Signed: 10/31/2019 8:31:51 AM By: Velia Meyer, Ellsie ([id number]) Entered By: Army Melia on 10/30/2019 08:23:46 [place], Dhriti ([id number]) -------------------------------------------------------------------------------- Vitals Details Patient Name: Julie Prince Date of Service: 10/30/2019 8:15 AM Medical Record Number: [id number] Patient Account Number: [id number] Date of Birth/Sex: [birth year] (59 y.o. F) Treating RN: Army Melia Primary Care Hoang Pettingill: [name] Other Clinician: Referring Maikol Grassia: [name] Treating Monty Spicher/Extender: Beverly Gust in Treatment: 10 Vital Signs Time Taken: 08:19 Temperature (F): 98.2 Height (in): 64 Pulse (bpm): 90 Weight (lbs): 210 Respiratory Rate (breaths/min): 16 Body Mass Index (BMI): 36 Blood Pressure (mmHg): 157/72 Reference Range: 80 - 120 mg / dl Electronic Signature(s) Signed: 10/31/2019 8:31:51 AM  By: Army Melia Entered By: Army Melia on 10/30/2019 08:19:51

## 2019-10-31 NOTE — Progress Notes (Signed)
[place], [place] ([id number]) Visit Report for 10/30/2019 Debridement Details Patient Name: Julie Prince, Julie Prince Date of Service: 10/30/2019 8:15 AM Medical Record Number: [id number] Patient Account Number: [id number] Date of Birth/Sex: Apr 16, 1960 (59 y.o. F) Treating RN: Army Melia Primary Care Provider: [name] Other Clinician: Referring Provider: [name] Treating Provider/Extender: Beverly Gust in Treatment: 10 Debridement Performed for Wound #1 Left Toe Fifth Assessment: Performed By: Physician Tobi Bastos, MD Debridement Type: Debridement Severity of Tissue Pre Debridement: Fat layer exposed Level of Consciousness (Pre- Awake and Alert procedure): Pre-procedure Verification/Time Out Yes - 08:30 Taken: Start Time: 08:40 Pain Control: Lidocaine Total Area Debrided (L x W): 0.7 (cm) x 0.7 (cm) = 0.49 (cm) Tissue and other material Viable, Non-Viable, Slough, Subcutaneous, Slough debrided: Level: Skin/Subcutaneous Tissue Debridement Description: Excisional Instrument: Curette Bleeding: Minimum Hemostasis Achieved: Pressure End Time: 08:48 Response to Treatment: Procedure was tolerated well Level of Consciousness (Post- Awake and Alert procedure): Post Debridement Measurements of Total Wound Length: (cm) 0.7 Width: (cm) 0.7 Depth: (cm) 0.3 Volume: (cm) 0.115 Character of Wound/Ulcer Post Debridement: Stable Severity of Tissue Post Debridement: Fat layer exposed Post Procedure Diagnosis Same as Pre-procedure Electronic Signature(s) Signed: 10/30/2019 4:03:38 PM By: Tobi Bastos MD, MBA Signed: 10/31/2019 8:31:51 AM By: Army Melia Entered By: Army Melia on 10/30/2019 08:49:07 [place], [place] ([id number]) -------------------------------------------------------------------------------- HPI Details Patient Name: Julie Prince Date of Service: 10/30/2019 8:15 AM Medical Record Number: [id number] Patient Account Number: [id number] Date of  Birth/Sex: Dec 07, 1960 (59 y.o. F) Treating RN: Cornell Barman Primary Care Provider: [name] Other Clinician: Referring Provider: [name] Treating Provider/Extender: Beverly Gust in Treatment: 10 History of Present Illness HPI Description: Resulted: 06/06/18 1152 Order Status: Completed Updated: 06/06/18 1201 Narrative: o 1. oNo significant aortoiliac disease. 2. oLeft lower extremity: Flush occlusion of the SFA with reconstitution via extensive collaterals from the profunda distally. oTwo-vessel runoff below the knee via the anterior and posterior tibial arteries. 3. oRight lower extremity: Diffuse SFA disease proximally followed by a total occlusion in the midsegment with reconstitution distally via extensive collaterals from the profunda. oOne-vessel runoff below the knee via the posterior tibial artery. Recommendations: Recommend maximizing medical therapy as the patient has good collaterals. o I am going to start her on small dose cilostazol for symptomatic improvement. ADMISSION 08/21/2019 This is a 59 year old woman with type 2 diabetes and known PAD. She had an angiogram in March 2020 by Dr. Fletcher Anon the results of which are shown above. At that time she had been seeing Dr. Amalia Hailey of podiatry with pain and swelling in her left foot she was felt to have capsulitis. She has been using a cam boot. She came to the attention of Dr. Fletcher Anon in March 2020 after noninvasive studies done on 05/09/2018 showed an ABI on the right of 0.63 with a TBI of 0.21 biphasic waveforms on the left 0.98 ABI with a TBI of 0.16 monophasic waveforms. Her ABIs were felt to be falsely elevated. She went on to have angiography on 06/06/2018. The results of this are above. She was felt to have good collaterals at that time she did not have any wounds. She had an MRI of the foot and 09/13/2018 that showed marrow edema in the distal calcaneus throughout the midfoot and in the visualized portion of  the second and third metatarsals likely due to stress changeo Plantar fasciitis. No fracture or infection The patient states that she has had a wound on the left second toe/callus over the last month. She was seen in the  ER and on 2 occasions in late May. On the first she had had cephalexin and doxycycline. She was not having any improvement in the erythema on the dorsal foot which per her description went up to the left medial ankle. On 5/28 she was started on Bactrim which she is in the middle of taking. She has had good improvement in the degree of erythema. Also 2 weeks ago she had a pedicure and she had some bleeding from the first and second toes which became discolored. This actually looks somewhat better than the picture from the ER on 5/28. Past medical history includes type 2 diabetes with known PAD 2. Barrett's esophagus 3. Hypertension 4. Hyperlipidemia ABIs in our clinic were 0.74 on the right and 0.86 on the left. She does not really describe claudication that I can elicit. She is still fairly active 6/9; patient comes back to clinic today for follow-up of the ulcer over her left fifth PIP on the left foot. Culture I did last week showed methicillin sensitive staph aureus which should have been sensitive to the Bactrim DS she was already put on by her primary physician. Noteworthy that she did not tolerate the Bactrim DS very well because of nausea and vomiting. She is allergic to penicillin. She is using silver alginate on the wound. For 1 reason or another we were not able to arrange a follow-up with Dr. Fletcher Anon to follow-up on her PAD. We will retry to resend the information 6/23; the patient has apparently seen Dr. Fletcher Anon yesterday and has an angiogram booked for next Wednesday. The ulcer on the dorsal aspect of her left fifth toe looks better less of a purulent surface. Still not completely viable. She cultured MSSA 7/7; the patient's angiogram on 6/30 showed flush occlusion of the  last SFA this reconstitutes distally via extensive collaterals from the profunda and two-vessel runoff below the knee via the anterior and posterior tibial arteries. This does not look too much different from her previous study. It was felt that the best way to revascularize would be a femoral to above-knee popliteal artery bypass. He made the comment that as the left toe is improving this might not be needed however the patient clearly describes "2 grocery isle" claudication when she is at the grocery store. She does not have symptoms at rest. She is a non-smoker 7/14; left fifth toe. Using endoform. Not much improvement today. She sees Dr. Fletcher Anon again on 7/27. I am assuming he will consider referring her to vascular surgery if he still thinks bypass is necessary 7/21; left fifth toe. No major change sees Dr. Fletcher Anon again on 7/27. Question is consideration of referral for vascular surgery. I change the primary dressing to Iodoflex 7/28 left fifth toe. Generally looks better. Cleaner surface slightly smaller. Using Iodoflex as of last week. I reviewed the note from yesterday by Dr. Fletcher Anon. Noted that she had flush occlusion of the SFA with reconstitution via extensive collaterals from the profunda with two-vessel runoff below the knee. She was felt to be a candidate for bypass surgery she is therefore been referred by Dr. Fletcher Anon to vein and vascular but at this point I am not sure which practice. She has a nonhealing wound on the left fifth toe and roughly 2-3 grocery store isle claudication 8/11-Patient comes in at 2 weeks, left fifth toe dimensions are slightly bigger today as compared to the last visit, a dressing changes no odor or [place], [place] ([id number]) drainage noted per patient. We are using  Iodoflex at this time. She has a appointment coming up this Friday with vein and vascular discussions about revascularization no doubt pending Electronic Signature(s) Signed: 10/30/2019 8:51:58 AM By:  Tobi Bastos MD, MBA Entered By: Tobi Bastos on 10/30/2019 08:51:58 [place], [place] ([id number]) -------------------------------------------------------------------------------- Physical Exam Details Patient Name: Julie Prince Date of Service: 10/30/2019 8:15 AM Medical Record Number: [id number] Patient Account Number: [id number] Date of Birth/Sex: Mar 02, 1961 (59 y.o. F) Treating RN: Cornell Barman Primary Care Provider: [name] Other Clinician: Referring Provider: [name] Treating Provider/Extender: Beverly Gust in Treatment: 10 Constitutional alert and oriented x 3. sitting or standing blood pressure is within target range for patient.. supine blood pressure is within target range for patient.. pulse regular and within target range for patient.Marland Kitchen respirations regular, non-labored and within target range for patient.Marland Kitchen temperature within target range for patient.. . . Well-nourished and well-hydrated in no acute distress. Notes Punched-out dorsal ulcer on the left fifth toe, heel granulation some of which was debrided using a curette there is definitely some visible tendon in the base, edges appear slightly macerated Electronic Signature(s) Signed: 10/30/2019 8:52:39 AM By: Tobi Bastos MD, MBA Entered By: Tobi Bastos on 10/30/2019 08:52:38 [place], [place] ([id number]) -------------------------------------------------------------------------------- Physician Orders Details Patient Name: Julie Prince Date of Service: 10/30/2019 8:15 AM Medical Record Number: [id number] Patient Account Number: [id number] Date of Birth/Sex: 12/31/1960 (58 y.o. F) Treating RN: Army Melia Primary Care Provider: [name] Other Clinician: Referring Provider: [name] Treating Provider/Extender: Beverly Gust in Treatment: 10 Verbal / Phone Orders: No Diagnosis Coding Wound Cleansing Wound #1 Left Toe Fifth o Clean wound with Normal Saline. o  Dial antibacterial soap, wash wounds, rinse and pat dry prior to dressing wounds o No tub bath. Anesthetic (add to Medication List) Wound #1 Left Toe Fifth o Topical Lidocaine 4% cream applied to wound bed prior to debridement (In Clinic Only). Primary Wound Dressing Wound #1 Left Toe Fifth o Iodoflex Secondary Dressing Wound #1 Left Toe Fifth o Other - Coverlet Dressing Change Frequency Wound #1 Left Toe Fifth o Change dressing every other day. Follow-up Appointments Wound #1 Left Toe Fifth o Return Appointment in 2 weeks. Off-Loading Wound #1 Left Toe Fifth o Other: Additional Orders / Instructions Wound #1 Left Toe Fifth o Increase protein intake. Electronic Signature(s) Signed: 10/30/2019 4:03:38 PM By: Tobi Bastos MD, MBA Signed: 10/31/2019 8:31:51 AM By: Army Melia Entered By: Army Melia on 10/30/2019 08:49:44 [place], [place] ([id number]) -------------------------------------------------------------------------------- Progress Note Details Patient Name: Julie Prince Date of Service: 10/30/2019 8:15 AM Medical Record Number: [id number] Patient Account Number: [id number] Date of Birth/Sex: March 24, 1960 (59 y.o. F) Treating RN: Cornell Barman Primary Care Provider: [name] Other Clinician: Referring Provider: [name] Treating Provider/Extender: Beverly Gust in Treatment: 10 Subjective History of Present Illness (HPI) Resulted: 06/06/18 1152 Order Status: Completed Updated: 06/06/18 1201 Narrative: 1. No significant aortoiliac disease. 2. Left lower extremity: Flush occlusion of the SFA with reconstitution via extensive collaterals from the profunda distally. Two-vessel runoff below the knee via the anterior and posterior tibial arteries. 3. 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 One-vessel  runoff below the knee via the posterior tibial artery. Recommendations: Recommend maximizing medical therapy as the patient has good collaterals. I am going to start her on small dose cilostazol for symptomatic improvement. ADMISSION 08/21/2019 This is a 59 year old woman with type 2 diabetes and known PAD. She had an  angiogram in March 2020 by Dr. Fletcher Anon the results of which are shown above. At that time she had been seeing Dr. Amalia Hailey of podiatry with pain and swelling in her left foot she was felt to have capsulitis. She has been using a cam boot. She came to the attention of Dr. Fletcher Anon in March 2020 after noninvasive studies done on 05/09/2018 showed an ABI on the right of 0.63 with a TBI of 0.21 biphasic waveforms on the left 0.98 ABI with a TBI of 0.16 monophasic waveforms. Her ABIs were felt to be falsely elevated. She went on to have angiography on 06/06/2018. The results of this are above. She was felt to have good collaterals at that time she did not have any wounds. She had an MRI of the foot and 09/13/2018 that showed marrow edema in the distal calcaneus throughout the midfoot and in the visualized portion of the second and third metatarsals likely due to stress changeo Plantar fasciitis. No fracture or infection The patient states that she has had a wound on the left second toe/callus over the last month. She was seen in the ER and on 2 occasions in late May. On the first she had had cephalexin and doxycycline. She was not having any improvement in the erythema on the dorsal foot which per her description went up to the left medial ankle. On 5/28 she was started on Bactrim which she is in the middle of taking. She has had good improvement in the degree of erythema. Also 2 weeks ago she had a pedicure and she had some bleeding from the first and second toes which became discolored. This actually looks somewhat better than the picture from the ER on 5/28. Past medical history includes type 2  diabetes with known PAD 2. Barrett's esophagus 3. Hypertension 4. Hyperlipidemia ABIs in our clinic were 0.74 on the right and 0.86 on the left. She does not really describe claudication that I can elicit. She is still fairly active 6/9; patient comes back to clinic today for follow-up of the ulcer over her left fifth PIP on the left foot. Culture I did last week showed methicillin sensitive staph aureus which should have been sensitive to the Bactrim DS she was already put on by her primary physician. Noteworthy that she did not tolerate the Bactrim DS very well because of nausea and vomiting. She is allergic to penicillin. She is using silver alginate on the wound. For 1 reason or another we were not able to arrange a follow-up with Dr. Fletcher Anon to follow-up on her PAD. We will retry to resend the information 6/23; the patient has apparently seen Dr. Fletcher Anon yesterday and has an angiogram booked for next Wednesday. The ulcer on the dorsal aspect of her left fifth toe looks better less of a purulent surface. Still not completely viable. She cultured MSSA 7/7; the patient's angiogram on 6/30 showed flush occlusion of the last SFA this reconstitutes distally via extensive collaterals from the profunda and two-vessel runoff below the knee via the anterior and posterior tibial arteries. This does not look too much different from her previous study. It was felt that the best way to revascularize would be a femoral to above-knee popliteal artery bypass. He made the comment that as the left toe is improving this might not be needed however the patient clearly describes "2 grocery isle" claudication when she is at the grocery store. She does not have symptoms at rest. She is a non-smoker 7/14; left fifth toe.  Using endoform. Not much improvement today. She sees Dr. Fletcher Anon again on 7/27. I am assuming he will consider referring her to vascular surgery if he still thinks bypass is necessary 7/21; left fifth toe.  No major change sees Dr. Fletcher Anon again on 7/27. Question is consideration of referral for vascular surgery. I change the primary dressing to Iodoflex 7/28 left fifth toe. Generally looks better. Cleaner surface slightly smaller. Using Iodoflex as of last week. I reviewed the note from yesterday by Dr. Fletcher Anon. Noted that she had flush occlusion of the SFA with reconstitution via extensive collaterals from the profunda with two-vessel runoff below the knee. She was felt to be a candidate for bypass surgery she is therefore been referred by Dr. Fletcher Anon to vein and vascular but at this point I am not sure which practice. She has a nonhealing wound on the left fifth toe and roughly 2-3 grocery store isle claudication 8/11-Patient comes in at 2 weeks, left fifth toe dimensions are slightly bigger today as compared to the last visit, a dressing changes no odor or [place], [place] ([id number]) drainage noted per patient. We are using Iodoflex at this time. She has a appointment coming up this Friday with vein and vascular discussions about revascularization no doubt pending Objective Constitutional alert and oriented x 3. sitting or standing blood pressure is within target range for patient.. supine blood pressure is within target range for patient.. pulse regular and within target range for patient.Marland Kitchen respirations regular, non-labored and within target range for patient.Marland Kitchen temperature within target range for patient.. Well-nourished and well-hydrated in no acute distress. Vitals Time Taken: 8:19 AM, Height: 64 in, Weight: 210 lbs, BMI: 36, Temperature: 98.2 F, Pulse: 90 bpm, Respiratory Rate: 16 breaths/min, Blood Pressure: 157/72 mmHg. General Notes: Punched-out dorsal ulcer on the left fifth toe, heel granulation some of which was debrided using a curette there is definitely some visible tendon in the base, edges appear slightly macerated Integumentary (Hair, Skin) Wound #1 status is Open. Original cause of  wound was Blister. The wound is located on the Left Toe Fifth. The wound measures 0.7cm length x 0.7cm width x 0.3cm depth; 0.385cm^2 area and 0.115cm^3 volume. There is Fat Layer (Subcutaneous Tissue) Exposed exposed. There is a medium amount of serous drainage noted. The wound margin is flat and intact. There is medium (34-66%) pink granulation within the wound bed. There is a medium (34-66%) amount of necrotic tissue within the wound bed including Adherent Slough. Procedures Wound #1 Pre-procedure diagnosis of Wound #1 is a Diabetic Wound/Ulcer of the Lower Extremity located on the Left Toe Fifth .Severity of Tissue Pre Debridement is: Fat layer exposed. There was a Excisional Skin/Subcutaneous Tissue Debridement with a total area of 0.49 sq cm performed by Tobi Bastos, MD. With the following instrument(s): Curette to remove Viable and Non-Viable tissue/material. Material removed includes Subcutaneous Tissue and Slough and after achieving pain control using Lidocaine. A time out was conducted at 08:30, prior to the start of the procedure. A Minimum amount of bleeding was controlled with Pressure. The procedure was tolerated well. Post Debridement Measurements: 0.7cm length x 0.7cm width x 0.3cm depth; 0.115cm^3 volume. Character of Wound/Ulcer Post Debridement is stable. Severity of Tissue Post Debridement is: Fat layer exposed. Post procedure Diagnosis Wound #1: Same as Pre-Procedure Plan Wound Cleansing: Wound #1 Left Toe Fifth: Clean wound with Normal Saline. Dial antibacterial soap, wash wounds, rinse and pat dry prior to dressing wounds No tub bath. Anesthetic (add to Medication List): Wound #1  Left Toe Fifth: Topical Lidocaine 4% cream applied to wound bed prior to debridement (In Clinic Only). Primary Wound Dressing: Wound #1 Left Toe Fifth: Iodoflex Secondary Dressing: Wound #1 Left Toe Fifth: Other - Coverlet Dressing Change Frequency: Wound #1 Left Toe Fifth: Change  dressing every other day. Follow-up Appointments: Wound #1 Left Toe Fifth: Return Appointment in 2 weeks. [place], [place] ([id number]) Off-Loading: Wound #1 Left Toe Fifth: Other: Additional Orders / Instructions: Wound #1 Left Toe Fifth: Increase protein intake. -Continue using Iodoflex as primary dressing every other day -Patient has upcoming vascular appointment which will probably define further role of revascularization which no doubt will improve the condition of this wound -Return to clinic in 2 weeks Electronic Signature(s) Signed: 10/30/2019 8:53:38 AM By: Tobi Bastos MD, MBA Entered By: Tobi Bastos on 10/30/2019 08:53:37 [place], [place] ([id number]) -------------------------------------------------------------------------------- [place] Details Patient Name: Julie Prince Date of Service: 10/30/2019 Medical Record Number: [id number] Patient Account Number: [id number] Date of Birth/Sex: [birth year] (59 y.o. F) Treating RN: Cornell Barman Primary Care Provider: [name] Other Clinician: Referring Provider: [name] Treating Provider/Extender: Beverly Gust in Treatment: 10 Diagnosis Coding ICD-10 Codes Code Description E11.621 Type 2 diabetes mellitus with foot ulcer L97.528 Non-pressure chronic ulcer of other part of left foot with other specified severity E11.51 Type 2 diabetes mellitus with diabetic peripheral angiopathy without gangrene L03.116 Cellulitis of left lower limb A49.01 Methicillin susceptible Staphylococcus aureus infection, unspecified site Facility Procedures CPT4 Code: 94801655 Description: 37482 - DEB SUBQ TISSUE 20 SQ CM/< Modifier: Quantity: 1 CPT4 Code: Description: ICD-10 Diagnosis Description E11.621 Type 2 diabetes mellitus with foot ulcer Modifier: Quantity: Physician Procedures CPT4 Code: 7078675 Description: 44920 - WC PHYS SUBQ TISS 20 SQ CM Modifier: Quantity: 1 CPT4 Code: Description: ICD-10 Diagnosis  Description E11.621 Type 2 diabetes mellitus with foot ulcer Modifier: Quantity: Electronic Signature(s) Signed: 10/30/2019 8:53:50 AM By: Tobi Bastos MD, MBA Entered By: Tobi Bastos on 10/30/2019 08:53:49

## 2019-11-01 ENCOUNTER — Encounter: Payer: Self-pay | Admitting: Vascular Surgery

## 2019-11-01 ENCOUNTER — Ambulatory Visit (INDEPENDENT_AMBULATORY_CARE_PROVIDER_SITE_OTHER): Payer: 59 | Admitting: Vascular Surgery

## 2019-11-01 ENCOUNTER — Other Ambulatory Visit: Payer: Self-pay

## 2019-11-01 VITALS — BP 145/78 | HR 73 | Resp 14 | Ht 64.0 in | Wt 212.0 lb

## 2019-11-01 DIAGNOSIS — I739 Peripheral vascular disease, unspecified: Secondary | ICD-10-CM | POA: Diagnosis not present

## 2019-11-01 NOTE — Progress Notes (Signed)
Patient ID: Julie Prince, female   DOB: 06-07-1960, 59 y.o.   MRN: [id number]  Reason for Consult: New Patient (Initial Visit) (PAD   LEFT FOOT PAIN)   Referred by Wellington Hampshire, MD  Subjective:     HPI:  Julie Prince is a 59 y.o. female presents for evaluation of left small toe ulceration.  She states that this has been present for multiple weeks.  She recently underwent angiogram with Dr. Fletcher Anon this demonstrated occluded SFA with reconstitution of the popliteal artery and at least two-vessel runoff to the ankle.  Wound has been quite stable.  She denies any fevers or chills.  States that she does have some issues with walking particularly claudication which also occurs on the right side.  She is accompanied today by her daughter.  She is on aspirin and statin.  Past Medical History:  Diagnosis Date   Allergy    Barrett esophagus    Cataract    left cataract removal   Essential hypertension, benign 09/12/2013   GERD (gastroesophageal reflux disease)    Hyperlipidemia 09/12/2013   Inappropriate sinus tachycardia 04/12/2018   Type 2 diabetes mellitus ([place]) 09/12/2013   Dr. Posey Pronto at [facility] History  Problem Relation Age of Onset   Lung cancer Mother    Stroke Father    Heart disease Maternal Aunt    Fibromyalgia Sister    Alzheimer's disease Maternal Grandmother    Colon cancer Neg Hx    Esophageal cancer Neg Hx    Rectal cancer Neg Hx    Stomach cancer Neg Hx    Pancreatic cancer Neg Hx    Past Surgical History:  Procedure Laterality Date   ABDOMINAL AORTOGRAM W/LOWER EXTREMITY N/A 06/06/2018   Procedure: ABDOMINAL AORTOGRAM W/LOWER EXTREMITY;  Surgeon: Wellington Hampshire, MD;  Location: [facility];  Service: Cardiovascular;  Laterality: N/A;   ABDOMINAL AORTOGRAM W/LOWER EXTREMITY N/A 09/18/2019   Procedure: ABDOMINAL AORTOGRAM W/LOWER EXTREMITY;  Surgeon: Wellington Hampshire, MD;  Location: [facility];  Service:  Cardiovascular;  Laterality: N/A;   APPENDECTOMY     CATARACT EXTRACTION Left    CESAREAN SECTION  1990   COLONOSCOPY     right tube and ovary removed     WISDOM TOOTH EXTRACTION      Short Social History:  Social History   Tobacco Use   Smoking status: Former Smoker    Packs/day: 0.50   Smokeless tobacco: Never Used   Tobacco comment: quit 30 plus years ago.  Substance Use Topics   Alcohol use: No    Alcohol/week: 0.0 standard drinks    Allergies  Allergen Reactions   Penicillins Hives and Shortness Of Breath   Metformin And Related Diarrhea    Current Outpatient Medications  Medication Sig Dispense Refill   aspirin 81 MG chewable tablet Chew 81 mg by mouth daily.      atorvastatin (LIPITOR) 40 MG tablet Take 1 tablet (40 mg total) by mouth at bedtime. 90 tablet 1   Cholecalciferol (VITAMIN D3 PO) Take 500 mg by mouth daily.     cilostazol (PLETAL) 50 MG tablet TAKE 1 TABLET BY MOUTH TWICE A DAY (Patient taking differently: Take 50 mg by mouth 2 (two) times daily. ) 180 tablet 1   Continuous Blood Gluc Sensor (FREESTYLE LIBRE 14 DAY SENSOR) MISC 1 each by Other route See admin instructions. Test daily as directed and change every 14 days.     metoprolol  tartrate (LOPRESSOR) 50 MG tablet Take 1.5 tablets (75 mg total) by mouth 2 (two) times daily. (Patient taking differently: Take 50 mg by mouth 2 (two) times daily. ) 270 tablet 1   TRESIBA FLEXTOUCH 100 UNIT/ML SOPN FlexTouch Pen INJECT 50 UNITS SUBCUTANEOUS AT BEDTIME (Patient taking differently: Inject 50 Units into the skin at bedtime. ) 15 pen 5   Current Facility-Administered Medications  Medication Dose Route Frequency Provider Last Rate Last Admin   0.9 %  sodium chloride infusion  500 mL Intravenous Once Pyrtle, Lajuan Lines, MD        Review of Systems  Constitutional:  Constitutional negative. HENT: HENT negative.  Eyes: Eyes negative.  Respiratory: Respiratory negative.  Cardiovascular:  Cardiovascular negative.  GI: Gastrointestinal negative.  Musculoskeletal: Musculoskeletal negative.  Skin: Positive for wound.  Neurological: Neurological negative. Hematologic: Hematologic/lymphatic negative.  Psychiatric: Psychiatric negative.        Objective:  Objective   Vitals:   11/01/19 0937  BP: (!) 145/78  Pulse: 73  Resp: 14  SpO2: 99%  Weight: 212 lb (96.2 kg)  Height: 5\' 4"  (1.626 m)   Body mass index is 36.39 kg/m.  Physical Exam HENT:     Head: Normocephalic.     Nose:     Comments: Wearing a mask Eyes:     Pupils: Pupils are equal, round, and reactive to light.  Cardiovascular:     Pulses:          Femoral pulses are 2+ on the right side and 2+ on the left side.      Popliteal pulses are 0 on the right side and 0 on the left side.  Pulmonary:     Effort: Pulmonary effort is normal.  Abdominal:     General: Abdomen is flat.     Palpations: Abdomen is soft. There is no mass.  Musculoskeletal:        General: Normal range of motion.     Right lower leg: No edema.     Left lower leg: No edema.  Skin:    Capillary Refill: Capillary refill takes 2 to 3 seconds.     Comments: Discoloration and small lateral ulcer on left small toe  Neurological:     General: No focal deficit present.     Mental Status: She is alert.  Psychiatric:        Mood and Affect: Mood normal.        Behavior: Behavior normal.        Thought Content: Thought content normal.        Judgment: Judgment normal.     Data: No new studies available for review today.  I did review her recent angiography.  She has a flush occluded SFA appears to have at least runoff via posterior tibial and anterior tibial arteries.  Unfortunately not see all of the images which demonstrate the reconstitution of her popliteal artery and these will need to be reviewed in the hospital prior to procedure.     Assessment/Plan:     59 year old female with left small toe ulceration.  She has a known  SFA occlusion.  We will plan for left common femoral to popliteal artery bypass.  I will need to review the images prior.  We will form a mapping on table to determine suitability of vein versus PTFE bypass.  I discussed the risk and benefits as well as expected outcomes including her risk of her heart, need for 3-5 hospital days, possible need for  rehab, requirement of total recovery up to 6 weeks.     Waynetta Sandy MD [facility]

## 2019-11-01 NOTE — H&P (View-Only) (Signed)
Patient ID: Julie Prince, female   DOB: 1960/04/25, 59 y.o.   MRN: [id number]  Reason for Consult: New Patient (Initial Visit) (PAD   LEFT FOOT PAIN)   Referred by Wellington Hampshire, MD  Subjective:     HPI:  Julie Prince is a 59 y.o. female presents for evaluation of left small toe ulceration.  She states that this has been present for multiple weeks.  She recently underwent angiogram with Dr. Fletcher Anon this demonstrated occluded SFA with reconstitution of the popliteal artery and at least two-vessel runoff to the ankle.  Wound has been quite stable.  She denies any fevers or chills.  States that she does have some issues with walking particularly claudication which also occurs on the right side.  She is accompanied today by her daughter.  She is on aspirin and statin.  Past Medical History:  Diagnosis Date  . Allergy   . Barrett esophagus   . Cataract    left cataract removal  . Essential hypertension, benign 09/12/2013  . GERD (gastroesophageal reflux disease)   . Hyperlipidemia 09/12/2013  . 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 Type 2 diabetes mellitus ([place]) 09/12/2013   Dr. Posey Pronto at [place] History  Problem Relation Age of Onset  . Lung cancer Mother   . Stroke Father   . Heart disease Maternal Aunt   . Fibromyalgia Sister   . Alzheimer's disease Maternal Grandmother   . Colon cancer Neg Hx   . Esophageal cancer Neg Hx   . Rectal cancer Neg Hx   . Stomach cancer Neg Hx   . Pancreatic cancer Neg Hx    Past Surgical History:  Procedure Laterality Date  . ABDOMINAL AORTOGRAM W/LOWER EXTREMITY N/A 06/06/2018   Procedure: ABDOMINAL AORTOGRAM W/LOWER EXTREMITY;  Surgeon: Wellington Hampshire, MD;  Location: [facility];  Service: Cardiovascular;  Laterality: N/A;  . ABDOMINAL AORTOGRAM W/LOWER EXTREMITY N/A 09/18/2019   Procedure: ABDOMINAL AORTOGRAM W/LOWER EXTREMITY;  Surgeon: Wellington Hampshire, MD;  Location: [facility];  Service:  Cardiovascular;  Laterality: N/A;  . APPENDECTOMY    . CATARACT EXTRACTION Left   . CESAREAN SECTION  1990  . COLONOSCOPY    . right tube and ovary removed    . WISDOM TOOTH EXTRACTION      Short Social History:  Social History   Tobacco Use  . Smoking status: Former Smoker    Packs/day: 0.50  . Smokeless tobacco: Never Used  . Tobacco comment: quit 30 plus years ago.  Substance Use Topics  . Alcohol use: No    Alcohol/week: 0.0 standard drinks    Allergies  Allergen Reactions  . Penicillins Hives and Shortness Of Breath  . Metformin And Related Diarrhea    Current Outpatient Medications  Medication Sig Dispense Refill  . aspirin 81 MG chewable tablet Chew 81 mg by mouth daily.     Marland Kitchen atorvastatin (LIPITOR) 40 MG tablet Take 1 tablet (40 mg total) by mouth at bedtime. 90 tablet 1  . Cholecalciferol (VITAMIN D3 PO) Take 500 mg by mouth daily.    . cilostazol (PLETAL) 50 MG tablet TAKE 1 TABLET BY MOUTH TWICE A DAY (Patient taking differently: Take 50 mg by mouth 2 (two) times daily. ) 180 tablet 1  . Continuous Blood Gluc Sensor (FREESTYLE LIBRE 14 DAY SENSOR) MISC 1 each by Other route See admin instructions. Test daily as directed and change every 14 days.    . metoprolol  tartrate (LOPRESSOR) 50 MG tablet Take 1.5 tablets (75 mg total) by mouth 2 (two) times daily. (Patient taking differently: Take 50 mg by mouth 2 (two) times daily. ) 270 tablet 1  . TRESIBA FLEXTOUCH 100 UNIT/ML SOPN FlexTouch Pen INJECT 50 UNITS SUBCUTANEOUS AT BEDTIME (Patient taking differently: Inject 50 Units into the skin at bedtime. ) 15 pen 5   Current Facility-Administered Medications  Medication Dose Route Frequency Provider Last Rate Last Admin  . 0.9 %  sodium chloride infusion  500 mL Intravenous Once Pyrtle, Lajuan Lines, MD        Review of Systems  Constitutional:  Constitutional negative. HENT: HENT negative.  Eyes: Eyes negative.  Respiratory: Respiratory negative.  Cardiovascular:  Cardiovascular negative.  GI: Gastrointestinal negative.  Musculoskeletal: Musculoskeletal negative.  Skin: Positive for wound.  Neurological: Neurological negative. Hematologic: Hematologic/lymphatic negative.  Psychiatric: Psychiatric negative.        Objective:  Objective   Vitals:   11/01/19 0937  BP: (!) 145/78  Pulse: 73  Resp: 14  SpO2: 99%  Weight: 212 lb (96.2 kg)  Height: 5\' 4"  (1.626 m)   Body mass index is 36.39 kg/m.  Physical Exam HENT:     Head: Normocephalic.     Nose:     Comments: Wearing a mask Eyes:     Pupils: Pupils are equal, round, and reactive to light.  Cardiovascular:     Pulses:          Femoral pulses are 2+ on the right side and 2+ on the left side.      Popliteal pulses are 0 on the right side and 0 on the left side.  Pulmonary:     Effort: Pulmonary effort is normal.  Abdominal:     General: Abdomen is flat.     Palpations: Abdomen is soft. There is no mass.  Musculoskeletal:        General: Normal range of motion.     Right lower leg: No edema.     Left lower leg: No edema.  Skin:    Capillary Refill: Capillary refill takes 2 to 3 seconds.     Comments: Discoloration and small lateral ulcer on left small toe  Neurological:     General: No focal deficit present.     Mental Status: She is alert.  Psychiatric:        Mood and Affect: Mood normal.        Behavior: Behavior normal.        Thought Content: Thought content normal.        Judgment: Judgment normal.     Data: No new studies available for review today.  I did review her recent angiography.  She has a flush occluded SFA appears to have at least runoff via posterior tibial and anterior tibial arteries.  Unfortunately not see all of the images which demonstrate the reconstitution of her popliteal artery and these will need to be reviewed in the hospital prior to procedure.     Assessment/Plan:     59 year old female with left small toe ulceration.  She has a known  SFA occlusion.  We will plan for left common femoral to popliteal artery bypass.  I will need to review the images prior.  We will form a mapping on table to determine suitability of vein versus PTFE bypass.  I discussed the risk and benefits as well as expected outcomes including her risk of her heart, need for 3-5 hospital days, possible need for  rehab, requirement of total recovery up to 6 weeks.     Waynetta Sandy MD [facility]

## 2019-11-04 ENCOUNTER — Other Ambulatory Visit: Payer: Self-pay | Admitting: Physician Assistant

## 2019-11-04 MED ORDER — SULFAMETHOXAZOLE-TRIMETHOPRIM 400-80 MG PO TABS: 1.0000 | ORAL_TABLET | Freq: Two times a day (BID) | ORAL | 0 refills | Status: AC

## 2019-11-06 ENCOUNTER — Ambulatory Visit
Admission: RE | Admit: 2019-11-06 | Discharge: 2019-11-06 | Disposition: A | Discharge status: Home / Self Care | Payer: 59 | Source: Ambulatory Visit | Attending: Internal Medicine | Admitting: Internal Medicine

## 2019-11-06 ENCOUNTER — Other Ambulatory Visit: Payer: Self-pay

## 2019-11-06 ENCOUNTER — Ambulatory Visit: Payer: 59 | Admitting: Physician Assistant

## 2019-11-06 ENCOUNTER — Telehealth: Payer: Self-pay | Admitting: *Deleted

## 2019-11-06 ENCOUNTER — Encounter: Payer: 59 | Admitting: Internal Medicine

## 2019-11-06 ENCOUNTER — Other Ambulatory Visit: Payer: Self-pay | Admitting: Internal Medicine

## 2019-11-06 DIAGNOSIS — L97529 Non-pressure chronic ulcer of other part of left foot with unspecified severity: Secondary | ICD-10-CM | POA: Insufficient documentation

## 2019-11-06 DIAGNOSIS — E11621 Type 2 diabetes mellitus with foot ulcer: Secondary | ICD-10-CM | POA: Diagnosis not present

## 2019-11-06 NOTE — Telephone Encounter (Signed)
Spoke with patient regarding worsening wound on toe, wound care sent her to get x-rays. She has a procedure scheduled for 8/24. Advised to follow wound care instructions regarding the toe.

## 2019-11-07 ENCOUNTER — Other Ambulatory Visit
Admission: RE | Admit: 2019-11-07 | Discharge: 2019-11-07 | Disposition: A | Discharge status: Home / Self Care | Payer: 59 | Source: Ambulatory Visit | Attending: Internal Medicine | Admitting: Internal Medicine

## 2019-11-07 ENCOUNTER — Other Ambulatory Visit (HOSPITAL_COMMUNITY): Payer: 59

## 2019-11-07 DIAGNOSIS — Z01812 Encounter for preprocedural laboratory examination: Secondary | ICD-10-CM | POA: Diagnosis present

## 2019-11-07 NOTE — Progress Notes (Signed)
[name], [place] ([id number]) Visit Report for 11/06/2019 HPI Details Patient Name: Julie Prince Date of Service: 11/06/2019 8:00 AM Medical Record Number: [id number] Patient Account Number: [id number] Date of Birth/Sex: 05-18-1960 (59 y.o. F) Treating RN: Cornell Barman Primary Care Provider: [name] Other Clinician: Referring Provider: [name] Treating Provider/Extender: Tito Dine in Treatment: 11 History of Present Illness HPI Description: Resulted: 06/06/18 1152 Order Status: Completed Updated: 06/06/18 1201 Narrative: o 1. oNo significant aortoiliac disease. 2. oLeft lower extremity: Flush occlusion of the SFA with reconstitution via extensive collaterals from the profunda distally. oTwo-vessel runoff below the knee via the anterior and posterior tibial arteries. 3. oRight lower extremity: Diffuse SFA disease proximally followed by a total occlusion in the midsegment with reconstitution distally via extensive collaterals from the profunda. oOne-vessel runoff below the knee via the posterior tibial artery. Recommendations: Recommend maximizing medical therapy as the patient has good collaterals. o I am going to start her on small dose cilostazol for symptomatic improvement. ADMISSION 08/21/2019 This is a 59 year old woman with type 2 diabetes and known PAD. She had an angiogram in March 2020 by Dr. Fletcher Anon the results of which are shown above. At that time she had been seeing Dr. Amalia Hailey of podiatry with pain and swelling in her left foot she was felt to have capsulitis. She has been using a cam boot. She came to the attention of Dr. Fletcher Anon in March 2020 after noninvasive studies done on 05/09/2018 showed an ABI on the right of 0.63 with a TBI of 0.21 biphasic waveforms on the left 0.98 ABI with a TBI of 0.16 monophasic waveforms. Her ABIs were felt to be falsely elevated. She went on to have angiography on 06/06/2018. The results of this are above. She was felt to  have good collaterals at that time she did not have any wounds. She had an MRI of the foot and 09/13/2018 that showed marrow edema in the distal calcaneus throughout the midfoot and in the visualized portion of the second and third metatarsals likely due to stress changeo Plantar fasciitis. No fracture or infection The patient states that she has had a wound on the left second toe/callus over the last month. She was seen in the ER and on 2 occasions in late May. On the first she had had cephalexin and doxycycline. She was not having any improvement in the erythema on the dorsal foot which per her description went up to the left medial ankle. On 5/28 she was started on Bactrim which she is in the middle of taking. She has had good improvement in the degree of erythema. Also 2 weeks ago she had a pedicure and she had some bleeding from the first and second toes which became discolored. This actually looks somewhat better than the picture from the ER on 5/28. Past medical history includes type 2 diabetes with known PAD 2. Barrett's esophagus 3. Hypertension 4. Hyperlipidemia ABIs in our clinic were 0.74 on the right and 0.86 on the left. She does not really describe claudication that I can elicit. She is still fairly active 6/9; patient comes back to clinic today for follow-up of the ulcer over her left fifth PIP on the left foot. Culture I did last week showed methicillin sensitive staph aureus which should have been sensitive to the Bactrim DS she was already put on by her primary physician. Noteworthy that she did not tolerate the Bactrim DS very well because of nausea and vomiting. She is allergic to penicillin. She is  using silver alginate on the wound. For 1 reason or another we were not able to arrange a follow-up with Dr. Fletcher Anon to follow-up on her PAD. We will retry to resend the information 6/23; the patient has apparently seen Dr. Fletcher Anon yesterday and has an angiogram booked for next Wednesday.  The ulcer on the dorsal aspect of her left fifth toe looks better less of a purulent surface. Still not completely viable. She cultured MSSA 7/7; the patient's angiogram on 6/30 showed flush occlusion of the last SFA this reconstitutes distally via extensive collaterals from the profunda and two-vessel runoff below the knee via the anterior and posterior tibial arteries. This does not look too much different from her previous study. It was felt that the best way to revascularize would be a femoral to above-knee popliteal artery bypass. He made the comment that as the left toe is improving this might not be needed however the patient clearly describes "2 grocery isle" claudication when she is at the grocery store. She does not have symptoms at rest. She is a non-smoker 7/14; left fifth toe. Using endoform. Not much improvement today. She sees Dr. Fletcher Anon again on 7/27. I am assuming he will consider referring her to vascular surgery if he still thinks bypass is necessary 7/21; left fifth toe. No major change sees Dr. Fletcher Anon again on 7/27. Question is consideration of referral for vascular surgery. I change the primary dressing to Iodoflex 7/28 left fifth toe. Generally looks better. Cleaner surface slightly smaller. Using Iodoflex as of last week. I reviewed the note from yesterday by Dr. Fletcher Anon. Noted that she had flush occlusion of the SFA with reconstitution via extensive collaterals from the profunda with two-vessel runoff below the knee. She was felt to be a candidate for bypass surgery she is therefore been referred by Dr. Richardo Priest, [place] ([id number]) to vein and vascular but at this point I am not sure which practice. She has a nonhealing wound on the left fifth toe and roughly 2-3 grocery store isle claudication 8/11-Patient comes in at 2 weeks, left fifth toe dimensions are slightly bigger today as compared to the last visit, a dressing changes no odor or drainage noted per patient. We are  using Iodoflex at this time. She has a appointment coming up this Friday with vein and vascular discussions about revascularization no doubt pending 8/18; the patient is seen Dr. Donzetta Matters of vascular surgery and is scheduled for bypass on 8/24 although I have not reviewed his note. She comes in today with a story that the area on the left fifth toe was kicked accidentally by a grandchild. It started to bleed. Her daughter texted Dr. Donzetta Matters on Sunday and Bactrim was prescribed. She comes in today with the wound markedly worse. It probes to bone there is some erythema spreading into the forefoot. Her daughter was concerned actually about swollen veins on the medial side of the leg I do not see a difficulty here. She has not been systemically unwell no fever etc. Electronic Signature(s) Signed: 11/06/2019 4:48:02 PM By: Linton Ham MD Entered By: Linton Ham on 11/06/2019 08:53:07 [place], [place] ([id number]) -------------------------------------------------------------------------------- Physical Exam Details Patient Name: Julie Prince Date of Service: 11/06/2019 8:00 AM Medical Record Number: [id number] Patient Account Number: [id number] Date of Birth/Sex: [birth year] (59 y.o. F) Treating RN: Cornell Barman Primary Care Provider: [name] Other Clinician: Referring Provider: [name] Treating Provider/Extender: Tito Dine in Treatment: 11 Constitutional Sitting or standing Blood Pressure is within target range for  patient.. Tachycardic. I rechecked this at 102. Respirations regular, non-labored and within target range.. Temperature is normal and within the target range for the patient.Marland Kitchen appears in no distress. Cardiovascular Pedal pulses are not palpable but her foot is warm. Notes Wound exam; this is markedly worse than when I saw this a few weeks ago. Deep punched out area on the dorsal aspect of the left fifth toe which probes easily to bone. She has  serosanguineous drainage and some erythema spreading into the forefoot that I have marked. The area on the medial leg that the daughter was initially concerned about I do not really see an issue here. Varicosities but certainly no spreading cellulitis in this area Electronic Signature(s) Signed: 11/06/2019 4:48:02 PM By: Linton Ham MD Entered By: Linton Ham on 11/06/2019 08:56:47 [place], [place] ([id number]) -------------------------------------------------------------------------------- Physician Orders Details Patient Name: Julie Prince Date of Service: 11/06/2019 8:00 AM Medical Record Number: [id number] Patient Account Number: [id number] Date of Birth/Sex: 30-Mar-1960 (58 y.o. F) Treating RN: Grover Canavan Primary Care Provider: [name] Other Clinician: Referring Provider: [name] Treating Provider/Extender: Tito Dine in Treatment: 11 Verbal / Phone Orders: No Diagnosis Coding Wound Cleansing Wound #1 Left Toe Fifth o Clean wound with Normal Saline. o Dial antibacterial soap, wash wounds, rinse and pat dry prior to dressing wounds o No tub bath. Anesthetic (add to Medication List) Wound #1 Left Toe Fifth o Topical Lidocaine 4% cream applied to wound bed prior to debridement (In Clinic Only). Primary Wound Dressing Wound #1 Left Toe Fifth o Silver Alginate Secondary Dressing Wound #1 Left Toe Fifth o Other - Coverlet Dressing Change Frequency Wound #1 Left Toe Fifth o Change dressing every other day. Follow-up Appointments Wound #1 Left Toe Fifth o Return Appointment in 2 weeks. Off-Loading Wound #1 Left Toe Fifth o Open toe surgical shoe Additional Orders / Instructions Wound #1 Left Toe Fifth o Increase protein intake. Laboratory o Bacteria identified in Wound by Culture (MICRO) - Left 5th toe oooo LOINC Code: 7510-2 oooo Convenience Name: Wound culture routine Radiology o X-ray, toes - left 5th  toe Electronic Signature(s) Signed: 11/06/2019 4:48:02 PM By: Linton Ham MD Signed: 11/06/2019 4:57:56 PM By: Grover Canavan [place], Lesa ([id number]) Entered By: Grover Canavan on 11/06/2019 08:50:51 [place], Evelyn ([id number]) -------------------------------------------------------------------------------- Problem List Details Patient Name: Julie Prince Date of Service: 11/06/2019 8:00 AM Medical Record Number: [id number] Patient Account Number: [id number] Date of Birth/Sex: 1961/01/28 (58 y.o. F) Treating RN: Cornell Barman Primary Care Provider: [name] Other Clinician: Referring Provider: [name] Treating Provider/Extender: Tito Dine in Treatment: 11 Active Problems ICD-10 Encounter Code Description Active Date MDM Diagnosis E11.621 Type 2 diabetes mellitus with foot ulcer 08/21/2019 No Yes L97.528 Non-pressure chronic ulcer of other part of left foot with other specified 08/21/2019 No Yes severity E11.51 Type 2 diabetes mellitus with diabetic peripheral angiopathy without 08/21/2019 No Yes gangrene L03.116 Cellulitis of left lower limb 08/21/2019 No Yes A49.01 Methicillin susceptible Staphylococcus aureus infection, unspecified site 08/28/2019 No Yes Inactive Problems Resolved Problems Electronic Signature(s) Signed: 11/06/2019 4:48:02 PM By: Linton Ham MD Entered By: Linton Ham on 11/06/2019 08:51:34 [place], [place] ([id number]) -------------------------------------------------------------------------------- Progress Note Details Patient Name: Julie Prince Date of Service: 11/06/2019 8:00 AM Medical Record Number: [id number] Patient Account Number: [id number] Date of Birth/Sex: October 25, 1960 (58 y.o. F) Treating RN: Cornell Barman Primary Care Provider: [name] Other Clinician: Referring Provider: [name] Treating Provider/Extender: Tito Dine in Treatment: 11 Subjective History of Present Illness  (HPI)  Resulted: 06/06/18 1152 Order Status: Completed Updated: 06/06/18 1201 Narrative: 1. No significant aortoiliac disease. 2. Left lower extremity: Flush occlusion of the SFA with reconstitution via extensive collaterals from the profunda distally. Two-vessel runoff below the knee via the anterior and posterior tibial arteries. 3. 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 One-vessel runoff below the knee via the posterior tibial artery. Recommendations: Recommend maximizing medical therapy as the patient has good collaterals. I am going to start her on small dose cilostazol for symptomatic improvement. ADMISSION 08/21/2019 This is a 59 year old woman with type 2 diabetes and known PAD. She had an angiogram in March 2020 by Dr. Fletcher Anon the results of which are shown above. At that time she had been seeing Dr. Amalia Hailey of podiatry with pain and swelling in her left foot she was felt to have capsulitis. She has been using a cam boot. She came to the attention of Dr. Fletcher Anon in March 2020 after noninvasive studies done on 05/09/2018 showed an ABI on the right of 0.63 with a TBI of 0.21 biphasic waveforms on the left 0.98 ABI with a TBI of 0.16 monophasic waveforms. Her ABIs were felt to be falsely elevated. She went on to have angiography on 06/06/2018. The results of this are above. She was felt to have good collaterals at that time she did not have any wounds. She had an MRI of the foot and 09/13/2018 that showed marrow edema in the distal calcaneus throughout the midfoot and in the visualized portion of the second and third metatarsals likely due to stress changeo Plantar fasciitis. No fracture or infection The patient states that she has had a wound on the left second toe/callus over the last month. She was seen in the ER and on 2 occasions in late May. On the first  she had had cephalexin and doxycycline. She was not having any improvement in the erythema on the dorsal foot which per her description went up to the left medial ankle. On 5/28 she was started on Bactrim which she is in the middle of taking. She has had good improvement in the degree of erythema. Also 2 weeks ago she had a pedicure and she had some bleeding from the first and second toes which became discolored. This actually looks somewhat better than the picture from the ER on 5/28. Past medical history includes type 2 diabetes with known PAD 2. Barrett's esophagus 3. Hypertension 4. Hyperlipidemia ABIs in our clinic were 0.74 on the right and 0.86 on the left. She does not really describe claudication that I can elicit. She is still fairly active 6/9; patient comes back to clinic today for follow-up of the ulcer over her left fifth PIP on the left foot. Culture I did last week showed methicillin sensitive staph aureus which should have been sensitive to the Bactrim DS she was already put on by her primary physician. Noteworthy that she did not tolerate the Bactrim DS very well because of nausea and vomiting. She is allergic to penicillin. She is using silver alginate on the wound. For 1 reason or another we were not able to arrange a follow-up with Dr. Fletcher Anon to follow-up on her PAD. We will retry to resend the information 6/23; the patient has apparently seen Dr. Fletcher Anon yesterday and has an angiogram booked for next Wednesday. The ulcer on the dorsal aspect of her left fifth toe looks better less of  a purulent surface. Still not completely viable. She cultured MSSA 7/7; the patient's angiogram on 6/30 showed flush occlusion of the last SFA this reconstitutes distally via extensive collaterals from the profunda and two-vessel runoff below the knee via the anterior and posterior tibial arteries. This does not look too much different from her previous study. It was felt that the best way to  revascularize would be a femoral to above-knee popliteal artery bypass. He made the comment that as the left toe is improving this might not be needed however the patient clearly describes "2 grocery isle" claudication when she is at the grocery store. She does not have symptoms at rest. She is a non-smoker 7/14; left fifth toe. Using endoform. Not much improvement today. She sees Dr. Fletcher Anon again on 7/27. I am assuming he will consider referring her to vascular surgery if he still thinks bypass is necessary 7/21; left fifth toe. No major change sees Dr. Fletcher Anon again on 7/27. Question is consideration of referral for vascular surgery. I change the primary dressing to Iodoflex 7/28 left fifth toe. Generally looks better. Cleaner surface slightly smaller. Using Iodoflex as of last week. I reviewed the note from yesterday by Dr. Fletcher Anon. Noted that she had flush occlusion of the SFA with reconstitution via extensive collaterals from the profunda with two-vessel runoff below the knee. She was felt to be a candidate for bypass surgery she is therefore been referred by Dr. Fletcher Anon to vein and vascular but at this point I am not sure which practice. She has a nonhealing wound on the left fifth toe and roughly 2-3 grocery store isle claudication 8/11-Patient comes in at 2 weeks, left fifth toe dimensions are slightly bigger today as compared to the last visit, a dressing changes no odor or [place], [place] ([id number]) drainage noted per patient. We are using Iodoflex at this time. She has a appointment coming up this Friday with vein and vascular discussions about revascularization no doubt pending 8/18; the patient is seen Dr. Donzetta Matters of vascular surgery and is scheduled for bypass on 8/24 although I have not reviewed his note. She comes in today with a story that the area on the left fifth toe was kicked accidentally by a grandchild. It started to bleed. Her daughter texted Dr. Donzetta Matters on Sunday and Bactrim was  prescribed. She comes in today with the wound markedly worse. It probes to bone there is some erythema spreading into the forefoot. Her daughter was concerned actually about swollen veins on the medial side of the leg I do not see a difficulty here. She has not been systemically unwell no fever etc. Objective Constitutional Sitting or standing Blood Pressure is within target range for patient.. Tachycardic. I rechecked this at 102. Respirations regular, non-labored and within target range.. Temperature is normal and within the target range for the patient.Marland Kitchen appears in no distress. Vitals Time Taken: 8:19 AM, Height: 64 in, Weight: 210 lbs, BMI: 36, Temperature: 98.4 F, Pulse: 120 bpm, Respiratory Rate: 16 breaths/min, Blood Pressure: 134/74 mmHg. Cardiovascular Pedal pulses are not palpable but her foot is warm. General Notes: Wound exam; this is markedly worse than when I saw this a few weeks ago. Deep punched out area on the dorsal aspect of the left fifth toe which probes easily to bone. She has serosanguineous drainage and some erythema spreading into the forefoot that I have marked. The area on the medial leg that the daughter was initially concerned about I do not really see an issue here.  Varicosities but certainly no spreading cellulitis in this area Integumentary (Hair, Skin) Wound #1 status is Open. Original cause of wound was Blister. The wound is located on the Left Toe Fifth. The wound measures 0.8cm length x 0.5cm width x 0.3cm depth; 0.314cm^2 area and 0.094cm^3 volume. There is Fat Layer (Subcutaneous Tissue) Exposed exposed. There is no tunneling noted, however, there is undermining starting at 12:00 and ending at 12:00 with a maximum distance of 0.8cm. There is a medium amount of serous drainage noted. The wound margin is flat and intact. There is small (1-33%) granulation within the wound bed. There is a medium (34-66%) amount of necrotic tissue within the wound bed including  Adherent Slough. Assessment Active Problems ICD-10 Type 2 diabetes mellitus with foot ulcer Non-pressure chronic ulcer of other part of left foot with other specified severity Type 2 diabetes mellitus with diabetic peripheral angiopathy without gangrene Cellulitis of left lower limb Methicillin susceptible Staphylococcus aureus infection, unspecified site Plan Wound Cleansing: Wound #1 Left Toe Fifth: Clean wound with Normal Saline. Dial antibacterial soap, wash wounds, rinse and pat dry prior to dressing wounds No tub bath. Anesthetic (add to Medication List): Wound #1 Left Toe Fifth: Topical Lidocaine 4% cream applied to wound bed prior to debridement (In Clinic Only). Primary Wound Dressing: Wound #1 Left Toe Fifth: Silver Alginate Secondary Dressing: [place], [place] ([id number]) Wound #1 Left Toe Fifth: Other - Coverlet Dressing Change Frequency: Wound #1 Left Toe Fifth: Change dressing every other day. Follow-up Appointments: Wound #1 Left Toe Fifth: Return Appointment in 2 weeks. Off-Loading: Wound #1 Left Toe Fifth: Open toe surgical shoe Additional Orders / Instructions: Wound #1 Left Toe Fifth: Increase protein intake. Laboratory ordered were: Wound culture routine - Left 5th toe Radiology ordered were: X-ray, toes - left 5th toe 1. I have not reviewed Dr. Claretha Cooper note however she is apparently scheduled for bypass surgery on 8/24 2. The wound is markedly deteriorated since the last time I saw this. This probes to bone is difficult not to believe she probably has underlying osteomyelitis with coexistent cellulitis she is already been started on Bactrim. Nevertheless she has serosanguineous drainage which I have cultured 3. There is a history of trauma here for this reason I have ordered an x-rayo Fracture etc. 4. The concerning issue currently is that unless she can be revascularized I am not sure at what level she would heal an amputation. I am not sure she will  heal a simple amputation of the fifth toe without the bypass surgery. 5. For now she is not systemically unwell I think we can continue to try and manage this as an outpatient. As noted I have marked the area on the dorsal foot proximal to the fifth toe counseled her to seek emergent medical attention if the erythema spreads or she become systemically unwell. The patient and her daughter expressed understanding Electronic Signature(s) Signed: 11/06/2019 4:48:02 PM By: Linton Ham MD Entered By: Linton Ham on 11/06/2019 08:58:51 [place], [place] ([id number]) -------------------------------------------------------------------------------- [place] Details Patient Name: Julie Prince Date of Service: 11/06/2019 Medical Record Number: [id number] Patient Account Number: [id number] Date of Birth/Sex: 09-30-1960 (58 y.o. F) Treating RN: Cornell Barman Primary Care Provider: [name] Other Clinician: Referring Provider: [name] Treating Provider/Extender: Tito Dine in Treatment: 11 Diagnosis Coding ICD-10 Codes Code Description E11.621 Type 2 diabetes mellitus with foot ulcer L97.528 Non-pressure chronic ulcer of other part of left foot with other specified severity E11.51 Type 2 diabetes mellitus with diabetic peripheral angiopathy  without gangrene L03.116 Cellulitis of left lower limb A49.01 Methicillin susceptible Staphylococcus aureus infection, unspecified site Facility Procedures CPT4 Code: 09326712 Description: 45809 - WOUND CARE VISIT-LEV 3 EST PT Modifier: Quantity: 1 Physician Procedures CPT4 Code: 9833825 Description: 05397 - WC PHYS LEVEL 4 - EST PT Modifier: Quantity: 1 CPT4 Code: Description: ICD-10 Diagnosis Description E11.621 Type 2 diabetes mellitus with foot ulcer L03.116 Cellulitis of left lower limb L97.528 Non-pressure chronic ulcer of other part of left foot with other specifie Modifier: d severity Quantity: Electronic  Signature(s) Signed: 11/06/2019 6:09:04 PM By: Gretta Cool, BSN, RN, CWS, [name] RN, BSN Previous Signature: 11/06/2019 4:48:02 PM Version By: Linton Ham MD Entered By: Gretta Cool, BSN, RN, CWS, [name] on 11/06/2019 18:09:03

## 2019-11-07 NOTE — Progress Notes (Addendum)
[place], [place] ([id number]) Visit Report for 11/06/2019 Arrival Information Details Patient Name: Julie Prince, Julie Prince Date of Service: 11/06/2019 8:00 AM Medical Record Number: [id number] Patient Account Number: [id number] Date of Birth/Sex: 01-13-1961 (59 y.o. F) Treating Prince: Julie Prince Primary Care Julie Prince: Julie Prince Other Clinician: Referring Julie Prince: Julie Prince Treating Julie Prince/Extender: Julie Prince in Prince: 11 Visit Information History Since Last Visit Added or deleted any medications: No Patient Arrived: Ambulatory Had a fall or experienced change in No Arrival Time: 08:18 activities of daily living that may affect Accompanied By: daughter risk of falls: Transfer Assistance: None Hospitalized since last visit: No Patient Requires Transmission-Based Precautions: No Pain Present Now: No Patient Has Alerts: Yes Patient Alerts: DMII Electronic Signature(s) Signed: 11/06/2019 4:57:56 PM By: Julie Prince Entered By: Julie Prince on 11/06/2019 08:19:17 [place], Julie Prince ([id number]) -------------------------------------------------------------------------------- Clinic Level of Care Assessment Details Patient Name: Julie Prince Date of Service: 11/06/2019 8:00 AM Medical Record Number: [id number] Patient Account Number: [id number] Date of Birth/Sex: Sep 30, 1960 (59 y.o. F) Treating Prince: Julie Prince Primary Care Ester Hilley: Julie Prince Other Clinician: Referring Adamae Ricklefs: Julie Prince Treating Raghav Verrilli/Extender: Julie Prince in Prince: 11 Clinic Level of Care Assessment Items TOOL 4 Quantity Score []  - Use when only an EandM is performed on FOLLOW-UP visit 0 ASSESSMENTS - Nursing Assessment / Reassessment X - Reassessment of Co-morbidities (includes updates in patient status) 1 10 X- 1 5 Reassessment of Adherence to Prince Plan ASSESSMENTS - Wound and Skin Assessment / Reassessment X - Simple Wound Assessment /  Reassessment - one wound 1 5 []  - 0 Complex Wound Assessment / Reassessment - multiple wounds []  - 0 Dermatologic / Skin Assessment (not related to wound area) ASSESSMENTS - Focused Assessment []  - Circumferential Edema Measurements - multi extremities 0 []  - 0 Nutritional Assessment / Counseling / Intervention []  - 0 Lower Extremity Assessment (monofilament, tuning fork, pulses) []  - 0 Peripheral Arterial Disease Assessment (using hand held doppler) ASSESSMENTS - Ostomy and/or Continence Assessment and Care []  - Incontinence Assessment and Management 0 []  - 0 Ostomy Care Assessment and Management (repouching, etc.) PROCESS - Coordination of Care X - Simple Patient / Family Education for ongoing care 1 15 []  - 0 Complex (extensive) Patient / Family Education for ongoing care X- 1 10 Staff obtains Consents, Records, Test Results / Process Orders []  - 0 Staff telephones HHA, Nursing Homes / Clarify orders / etc []  - 0 Routine Transfer to another Facility (non-emergent condition) []  - 0 Routine Hospital Admission (non-emergent condition) []  - 0 New Admissions / Biomedical engineer / Ordering NPWT, Apligraf, etc. []  - 0 Emergency Hospital Admission (emergent condition) X- 1 10 Simple Discharge Coordination []  - 0 Complex (extensive) Discharge Coordination PROCESS - Special Needs []  - Pediatric / Minor Patient Management 0 []  - 0 Isolation Patient Management []  - 0 Hearing / Language / Visual special needs []  - 0 Assessment of Community assistance (transportation, D/C planning, etc.) []  - 0 Additional assistance / Altered mentation []  - 0 Support Surface(s) Assessment (bed, cushion, seat, etc.) INTERVENTIONS - Wound Cleansing / Measurement [place], [name] X- 1 5 Simple Wound Cleansing - one wound []  - 0 Complex Wound Cleansing - multiple wounds X- 1 5 Wound Imaging (photographs - any number of wounds) []  - 0 Wound Tracing (instead of  photographs) X- 1 5 Simple Wound Measurement - one wound []  - 0 Complex Wound Measurement - multiple wounds INTERVENTIONS - Wound Dressings []  - Small Wound Dressing one or multiple wounds  0 []  - 0 Medium Wound Dressing one or multiple wounds X- 1 20 Large Wound Dressing one or multiple wounds []  - 0 Application of Medications - topical []  - 0 Application of Medications - injection INTERVENTIONS - Miscellaneous []  - External ear exam 0 X- 1 5 Specimen Collection (cultures, biopsies, blood, body fluids, etc.) X- 1 5 Specimen(s) / Culture(s) sent or taken to Lab for analysis []  - 0 Patient Transfer (multiple staff / Civil Service fast streamer / Similar devices) []  - 0 Simple Staple / Suture removal (25 or less) []  - 0 Complex Staple / Suture removal (26 or more) []  - 0 Hypo / Hyperglycemic Management (close monitor of Blood Glucose) []  - 0 Ankle / Brachial Index (ABI) - do not check if billed separately X- 1 5 Vital Signs Has the patient been seen at the hospital within the last three years: Yes Total Score: 105 Level Of Care: New/Established - Level 3 Electronic Signature(s) Signed: 11/07/2019 6:39:29 PM By: Julie Prince, BSN, Prince, CWS, Julie Prince, BSN Entered By: Julie Prince, BSN, Prince, CWS, Julie on 11/06/2019 18:08:53 [place], Julie Prince ([id number]) -------------------------------------------------------------------------------- Encounter Discharge Information Details Patient Name: Julie Prince Date of Service: 11/06/2019 8:00 AM Medical Record Number: [id number] Patient Account Number: [id number] Date of Birth/Sex: [birth year] (58 y.o. F) Treating Prince: Julie Prince Primary Care Alizay Bronkema: Julie Prince Other Clinician: Referring Basilio Meadow: Julie Prince Treating Ramonda Galyon/Extender: Julie Prince in Prince: 11 Encounter Discharge Information Items Discharge Condition: Stable Ambulatory Status: Ambulatory Discharge Destination: Home Transportation: Private Auto Accompanied By:  daughter Schedule Follow-up Appointment: Yes Clinical Summary of Care: Electronic Signature(s) Signed: 11/06/2019 6:10:13 PM By: Julie Prince, BSN, Prince, CWS, Julie Prince, BSN Entered By: Julie Prince, BSN, Prince, CWS, Julie on 11/06/2019 18:10:12 [place], Julie Prince ([id number]) -------------------------------------------------------------------------------- Lower Extremity Assessment Details Patient Name: Julie Prince Date of Service: 11/06/2019 8:00 AM Medical Record Number: [id number] Patient Account Number: [id number] Date of Birth/Sex: 21-Oct-1960 (58 y.o. F) Treating Prince: Julie Prince Primary Care Kadian Barcellos: Julie Prince Other Clinician: Referring Nathaly Dawkins: Julie Prince Treating Elanor Cale/Extender: Ricard Dillon Weeks in Prince: 11 Edema Assessment Assessed: [Left: Yes] [Right: No] Edema: [Left: Ye] [Right: s] Calf Left: Right: Point of Measurement: 33 cm From Medial Instep 35 cm cm Ankle Left: Right: Point of Measurement: 9 cm From Medial Instep 23 cm cm Vascular Assessment Pulses: Dorsalis Pedis Palpable: [Left:No] Doppler Audible: [Left:Yes] Posterior Tibial Doppler Audible: [Left:Yes] Electronic Signature(s) Signed: 11/06/2019 4:57:56 PM By: Julie Prince Entered By: Julie Prince on 11/06/2019 08:41:56 [place], Julie Prince ([id number]) -------------------------------------------------------------------------------- Multi Wound Chart Details Patient Name: Julie Prince Date of Service: 11/06/2019 8:00 AM Medical Record Number: [id number] Patient Account Number: [id number] Date of Birth/Sex: [birth year] (58 y.o. F) Treating Prince: Julie Prince Primary Care Iyana Topor: Julie Prince Other Clinician: Referring Arrick Dutton: Julie Prince Treating Arshiya Jakes/Extender: Julie Prince in Prince: 11 Vital Signs Height(in): 64 Pulse(bpm): 120 Weight(lbs): 210 Blood Pressure(mmHg): 134/74 Body Mass Index(BMI): 36 Temperature(F): 98.4 Respiratory Rate(breaths/min):  16 Photos: [N/A:N/A] Wound Location: Left Toe Fifth N/A N/A Wounding Event: Blister N/A N/A Primary Etiology: Diabetic Wound/Ulcer of the Lower N/A N/A Extremity Comorbid History: Hypertension, Type II Diabetes N/A N/A Date Acquired: 08/07/2019 N/A N/A Weeks of Prince: 11 N/A N/A Wound Status: Open N/A N/A Measurements L x W x D (cm) 0.8x0.5x0.3 N/A N/A Area (cm) : 0.314 N/A N/A Volume (cm) : 0.094 N/A N/A % Reduction in Area: 33.30% N/A N/A % Reduction in Volume: 0.00% N/A N/A Starting Position 1 (o'clock): 12 Ending Position 1 (o'clock): 12 Maximum Distance 1 (cm): 0.8 Undermining:  Yes N/A N/A Classification: Grade 1 N/A N/A Exudate Amount: Medium N/A N/A Exudate Type: Serous N/A N/A Exudate Color: amber N/A N/A Wound Margin: Flat and Intact N/A N/A Granulation Amount: Small (1-33%) N/A N/A Necrotic Amount: Medium (34-66%) N/A N/A Exposed Structures: Fat Layer (Subcutaneous Tissue) N/A N/A Exposed: Yes Fascia: No Tendon: No Muscle: No Joint: No Bone: No Epithelialization: None N/A N/A Prince Notes Electronic Signature(s) Signed: 11/06/2019 4:48:02 PM By: Linton Ham MD Entered By: Linton Ham on 11/06/2019 08:51:42 [place], Julie Prince ([id number]) [place], Julie Prince ([id number]) -------------------------------------------------------------------------------- Multi-Disciplinary Care Plan Details Patient Name: Julie Prince Date of Service: 11/06/2019 8:00 AM Medical Record Number: [id number] Patient Account Number: [id number] Date of Birth/Sex: 05-31-1960 (59 y.o. F) Treating Prince: Julie Prince Primary Care Tiant Peixoto: Julie Prince Other Clinician: Referring Anja Neuzil: Julie Prince Treating Harleigh Civello/Extender: Julie Prince in Prince: 11 Active Inactive Necrotic Tissue Nursing Diagnoses: Impaired tissue integrity related to necrotic/devitalized tissue Goals: Necrotic/devitalized tissue will be minimized in the wound bed Date Initiated:  08/21/2019 Target Resolution Date: 08/28/2019 Goal Status: Active Interventions: Assess patient pain level pre-, during and post procedure and prior to discharge Prince Activities: Apply topical anesthetic as ordered : 08/21/2019 Notes: Orientation to the Wound Care Program Nursing Diagnoses: Knowledge deficit related to the wound healing center program Goals: Patient/caregiver will verbalize understanding of the [place] Program Date Initiated: 08/21/2019 Target Resolution Date: 08/29/2019 Goal Status: Active Interventions: Provide education on orientation to the wound center Notes: Soft Tissue Infection Nursing Diagnoses: Impaired tissue integrity Goals: Patient will remain free of wound infection Date Initiated: 08/21/2019 Target Resolution Date: 08/28/2019 Goal Status: Active Interventions: Assess signs and symptoms of infection every visit Prince Activities: Systemic antibiotics : 08/21/2019 Notes: Tissue Oxygenation Nursing Diagnoses: Potential alteration in peripheral tissue perfusion (select prior to confirmation of diagnosis) [place], Julie Prince ([id number]) Goals: Patient/caregiver will verbalize understanding of disease process and disease management Date Initiated: 08/21/2019 Target Resolution Date: 08/28/2019 Goal Status: Active Interventions: Assess patient understanding of disease process and management upon diagnosis and as needed Assess peripheral arterial status upon admission and as needed Provide education on tissue oxygenation and ischemia Notes: Wound/Skin Impairment Nursing Diagnoses: Impaired tissue integrity Goals: Ulcer/skin breakdown will have a volume reduction of 30% by week 4 Date Initiated: 08/21/2019 Target Resolution Date: 08/28/2019 Goal Status: Active Interventions: Provide education on ulcer and skin care Notes: Electronic Signature(s) Signed: 11/06/2019 4:57:56 PM By: Julie Prince Entered By: Julie Prince on 11/06/2019  08:48:11 [place], Julie Prince ([id number]) -------------------------------------------------------------------------------- Pain Assessment Details Patient Name: Julie Prince Date of Service: 11/06/2019 8:00 AM Medical Record Number: [id number] Patient Account Number: [id number] Date of Birth/Sex: 1961/02/14 (59 y.o. F) Treating Prince: Julie Prince Primary Care Zakk Borgen: Julie Prince Other Clinician: Referring Dawnmarie Breon: Julie Prince Treating Aikam Vinje/Extender: Julie Prince in Prince: 11 Active Problems Location of Pain Severity and Description of Pain Patient Has Paino No Site Locations Pain Management and Medication Current Pain Management: Electronic Signature(s) Signed: 11/06/2019 4:57:56 PM By: Julie Prince Entered By: Julie Prince on 11/06/2019 08:23:29 [place], Julie Prince ([id number]) -------------------------------------------------------------------------------- Patient/Caregiver Education Details Patient Name: Julie Prince Date of Service: 11/06/2019 8:00 AM Medical Record Number: [id number] Patient Account Number: [id number] Date of Birth/Gender: December 17, 1960 (59 y.o. F) Treating Prince: Julie Prince Primary Care Physician: Julie Prince Other Clinician: Referring Physician: Lorrene Prince Treating Physician/Extender: Julie Prince in Prince: 11 Education Assessment Education Provided To: Patient Education Topics Provided Wound/Skin Impairment: Handouts: Caring for Your Ulcer Methods: Demonstration, Explain/Verbal Responses: State content correctly Electronic Signature(s) Signed: 11/07/2019 6:39:29 PM  By: Julie Prince, BSN, Prince, CWS, Julie Prince, BSN Entered By: Julie Prince, BSN, Prince, CWS, Julie on 11/06/2019 18:09:24 [place], Julie Prince ([id number]) -------------------------------------------------------------------------------- Wound Assessment Details Patient Name: Julie Prince Date of Service: 11/06/2019 8:00 AM Medical Record Number: [id number] Patient  Account Number: [id number] Date of Birth/Sex: November 26, 1960 (58 y.o. F) Treating Prince: Julie Prince Primary Care Dilia Alemany: Julie Prince Other Clinician: Referring Mckennah Kretchmer: Julie Prince Treating Betul Brisky/Extender: Julie Prince in Prince: 11 Wound Status Wound Number: 1 Primary Etiology: Diabetic Wound/Ulcer of the Lower Extremity Wound Location: Left Toe Fifth Wound Status: Open Wounding Event: Blister Comorbid History: Hypertension, Type II Diabetes Date Acquired: 08/07/2019 Weeks Of Prince: 11 Clustered Wound: No Photos Wound Measurements Length: (cm) 0.8 Width: (cm) 0.5 Depth: (cm) 0.3 Area: (cm) 0.314 Volume: (cm) 0.094 % Reduction in Area: 33.3% % Reduction in Volume: 0% Epithelialization: None Tunneling: No Undermining: Yes Starting Position (o'clock): 12 Ending Position (o'clock): 12 Maximum Distance: (cm) 0.8 Wound Description Classification: Grade 1 Wound Margin: Flat and Intact Exudate Amount: Medium Exudate Type: Serous Exudate Color: amber Foul Odor After Cleansing: No Slough/Fibrino Yes Wound Bed Granulation Amount: Small (1-33%) Exposed Structure Necrotic Amount: Medium (34-66%) Fascia Exposed: No Necrotic Quality: Adherent Slough Fat Layer (Subcutaneous Tissue) Exposed: Yes Tendon Exposed: No Muscle Exposed: No Joint Exposed: No Bone Exposed: No Electronic Signature(s) Signed: 11/06/2019 4:57:56 PM By: Julie Prince Entered By: Julie Prince on 11/06/2019 08:35:31 Julie Prince, Julie Prince ([id number]) -------------------------------------------------------------------------------- [place] Details Patient Name: Julie Prince Date of Service: 11/06/2019 8:00 AM Medical Record Number: [id number] Patient Account Number: [id number] Date of Birth/Sex: 07-26-1960 (59 y.o. F) Treating Prince: Julie Prince Primary Care Lurlie Wigen: Julie Prince Other Clinician: Referring Tayra Dawe: Julie Prince Treating Estefana Taylor/Extender: Julie Prince in Prince: 11 Vital Signs Time Taken: 08:19 Temperature (F): 98.4 Height (in): 64 Pulse (bpm): 120 Weight (lbs): 210 Respiratory Rate (breaths/min): 16 Body Mass Index (BMI): 36 Blood Pressure (mmHg): 134/74 Reference Range: 80 - 120 mg / dl Electronic Signature(s) Signed: 11/06/2019 4:57:56 PM By: Julie Prince Entered By: Julie Prince on 11/06/2019 17:61:60

## 2019-11-07 NOTE — Progress Notes (Signed)
[place], [address] [place]. [place] [address] Phone: [phone] Fax: [phone]      Your procedure is scheduled on August 24  Report to [facility] Main Entrance "A" at 0530 A.M., and check in at the Admitting office.  Call this number if you have problems the morning of surgery:  [phone]  Call [phone] if you have any questions prior to your surgery date Monday-Friday 8am-4pm    Remember:  Do not eat  Or drink after midnight the night before your surgery    Take these medicines the morning of surgery with A SIP OF WATER  metoprolol tartrate (LOPRESSOR)  As of today, STOP taking any Aspirin (unless otherwise instructed by your surgeon) Aleve, Naproxen, Ibuprofen, Motrin, Advil, Goody's, BC's, all herbal medications, fish oil, and all vitamins.   WHAT DO I DO ABOUT MY DIABETES MEDICATION?   Marland Kitchen Do not take oral diabetes medicines (pills) the morning of surgery.  . THE NIGHT BEFORE SURGERY, take _____25______ units of ____TRESIBA [place] _______insulin.   HOW TO MANAGE YOUR DIABETES BEFORE AND AFTER SURGERY  Why is it important to control my blood sugar before and after surgery? . Improving blood sugar levels before and after surgery helps healing and can limit problems. . A way of improving blood sugar control is eating a healthy diet by: o  Eating less sugar and carbohydrates o  Increasing activity/exercise o  Talking with your doctor about reaching your blood sugar goals . High blood sugars (greater than 180 mg/dL) can raise your risk of infections and slow your recovery, so you will need to focus on controlling your diabetes during the weeks before surgery. . Make sure that the doctor who takes care of your diabetes knows about your planned surgery including the date and location.  How do I manage my blood sugar before surgery? . Check your blood sugar at least 4 times a day, starting 2 days  before surgery, to make sure that the level is not too high or low. . Check your blood sugar the morning of your surgery when you wake up and every 2 hours until you get to the Short Stay unit. o If your blood sugar is less than 70 mg/dL, you will need to treat for low blood sugar: - Do not take insulin. - Treat a low blood sugar (less than 70 mg/dL) with  cup of clear juice (cranberry or apple), 4 glucose tablets, OR glucose gel. - Recheck blood sugar in 15 minutes after treatment (to make sure it is greater than 70 mg/dL). If your blood sugar is not greater than 70 mg/dL on recheck, call [phone] for further instructions. . Report your blood sugar to the short stay nurse when you get to Short Stay.  . If you are admitted to the hospital after surgery: o Your blood sugar will be checked by the staff and you will probably be given insulin after surgery (instead of oral diabetes medicines) to make sure you have good blood sugar levels. o The goal for blood sugar control after surgery is 80-180 mg/dL.                     Do not wear jewelry, make up, or nail polish            Do not wear lotions, powders, perfumes/colognes, or deodorant.            Do not shave 48  hours prior to surgery.  Men may shave face and neck.            Do not bring valuables to the hospital.            [facility] is not responsible for any belongings or valuables.  Do NOT Smoke (Tobacco/Vaping) or drink Alcohol 24 hours prior to your procedure If you use a CPAP at night, you may bring all equipment for your overnight stay.   Contacts, glasses, dentures or bridgework may not be worn into surgery.      For patients admitted to the hospital, discharge time will be determined by your treatment team.   Patients discharged the day of surgery will not be allowed to drive home, and someone needs to stay with them for 24 hours.    Special instructions:   [place]- Preparing For Surgery  Before surgery, you can  play an important role. Because skin is not sterile, your skin needs to be as free of germs as possible. You can reduce the number of germs on your skin by washing with CHG (chlorahexidine gluconate) Soap before surgery.  CHG is an antiseptic cleaner which kills germs and bonds with the skin to continue killing germs even after washing.    Oral Hygiene is also important to reduce your risk of infection.  Remember - BRUSH YOUR TEETH THE MORNING OF SURGERY WITH YOUR REGULAR TOOTHPASTE  Please do not use if you have an allergy to CHG or antibacterial soaps. If your skin becomes reddened/irritated stop using the CHG.  Do not shave (including legs and underarms) for at least 48 hours prior to first CHG shower. It is OK to shave your face.  Please follow these instructions carefully.   1. Shower the NIGHT BEFORE SURGERY and the MORNING OF SURGERY with CHG Soap.   2. If you chose to wash your hair, wash your hair first as usual with your normal shampoo.  3. After you shampoo, rinse your hair and body thoroughly to remove the shampoo.  4. Use CHG as you would any other liquid soap. You can apply CHG directly to the skin and wash gently with a scrungie or a clean washcloth.   5. Apply the CHG Soap to your body ONLY FROM THE NECK DOWN.  Do not use on open wounds or open sores. Avoid contact with your eyes, ears, mouth and genitals (private parts). Wash Face and genitals (private parts)  with your normal soap.   6. Wash thoroughly, paying special attention to the area where your surgery will be performed.  7. Thoroughly rinse your body with warm water from the neck down.  8. DO NOT shower/wash with your normal soap after using and rinsing off the CHG Soap.  9. Pat yourself dry with a CLEAN TOWEL.  10. Wear CLEAN PAJAMAS to bed the night before surgery  11. Place CLEAN SHEETS on your bed the night of your first shower and DO NOT SLEEP WITH PETS.   Day of Surgery: Wear Clean/Comfortable clothing  the morning of surgery Do not apply any deodorants/lotions.   Remember to brush your teeth WITH YOUR REGULAR TOOTHPASTE.   Please read over the following fact sheets that you were given.

## 2019-11-08 ENCOUNTER — Other Ambulatory Visit: Payer: Self-pay

## 2019-11-08 ENCOUNTER — Telehealth: Payer: Self-pay

## 2019-11-08 ENCOUNTER — Encounter (HOSPITAL_COMMUNITY): Payer: Self-pay

## 2019-11-08 ENCOUNTER — Encounter (HOSPITAL_COMMUNITY)
Admission: RE | Admit: 2019-11-08 | Discharge: 2019-11-08 | Disposition: A | Discharge status: Home / Self Care | Payer: 59 | Source: Ambulatory Visit | Attending: Vascular Surgery | Admitting: Vascular Surgery

## 2019-11-08 DIAGNOSIS — I440 Atrioventricular block, first degree: Secondary | ICD-10-CM | POA: Insufficient documentation

## 2019-11-08 DIAGNOSIS — Z01812 Encounter for preprocedural laboratory examination: Secondary | ICD-10-CM | POA: Insufficient documentation

## 2019-11-08 DIAGNOSIS — E785 Hyperlipidemia, unspecified: Secondary | ICD-10-CM | POA: Diagnosis not present

## 2019-11-08 DIAGNOSIS — R000 Tachycardia, unspecified: Secondary | ICD-10-CM | POA: Diagnosis not present

## 2019-11-08 DIAGNOSIS — Z9114 Patient's other noncompliance with medication regimen: Secondary | ICD-10-CM | POA: Insufficient documentation

## 2019-11-08 DIAGNOSIS — E1151 Type 2 diabetes mellitus with diabetic peripheral angiopathy without gangrene: Secondary | ICD-10-CM | POA: Insufficient documentation

## 2019-11-08 DIAGNOSIS — I10 Essential (primary) hypertension: Secondary | ICD-10-CM | POA: Insufficient documentation

## 2019-11-08 DIAGNOSIS — Z794 Long term (current) use of insulin: Secondary | ICD-10-CM | POA: Insufficient documentation

## 2019-11-08 HISTORY — DX: Other specified postprocedural states: R11.2

## 2019-11-08 HISTORY — DX: Other specified postprocedural states: Z98.890

## 2019-11-08 HISTORY — DX: Peripheral vascular disease, unspecified: I73.9

## 2019-11-08 LAB — URINALYSIS, ROUTINE W REFLEX MICROSCOPIC
Bilirubin Urine: NEGATIVE
Glucose, UA: >=500 mg/dL — AB
Hgb urine dipstick: NEGATIVE
Ketones, ur: NEGATIVE mg/dL
Nitrite: NEGATIVE
Protein, ur: NEGATIVE mg/dL
Specific Gravity, Urine: 1.025 (ref 1.005–1.030)
WBC, UA: >50 WBC/hpf — ABNORMAL HIGH (ref 0–5)
pH: 6 (ref 5.0–8.0)

## 2019-11-08 LAB — COMPREHENSIVE METABOLIC PANEL
ALT: 10 U/L (ref 0–44)
AST: 12 U/L — ABNORMAL LOW (ref 15–41)
Albumin: 3.1 g/dL — ABNORMAL LOW (ref 3.5–5.0)
Alkaline Phosphatase: 89 U/L (ref 38–126)
Anion gap: 11 (ref 5–15)
BUN: 13 mg/dL (ref 6–20)
CO2: 26 mmol/L (ref 22–32)
Calcium: 9.2 mg/dL (ref 8.9–10.3)
Chloride: 98 mmol/L (ref 98–111)
Creatinine, Ser: 1.33 mg/dL — ABNORMAL HIGH (ref 0.44–1.00)
GFR calc Af Amer: 51 mL/min — ABNORMAL LOW (ref >60)
GFR calc non Af Amer: 44 mL/min — ABNORMAL LOW (ref >60)
Glucose, Bld: 401 mg/dL — ABNORMAL HIGH (ref 70–99)
Potassium: 4.1 mmol/L (ref 3.5–5.1)
Sodium: 135 mmol/L (ref 135–145)
Total Bilirubin: 0.5 mg/dL (ref 0.3–1.2)
Total Protein: 7.5 g/dL (ref 6.5–8.1)

## 2019-11-08 LAB — TYPE AND SCREEN
ABO/RH(D): O POS
Antibody Screen: NEGATIVE

## 2019-11-08 LAB — CBC
HCT: 40.6 % (ref 36.0–46.0)
Hemoglobin: 13.3 g/dL (ref 12.0–15.0)
MCH: 31.7 pg (ref 26.0–34.0)
MCHC: 32.8 g/dL (ref 30.0–36.0)
MCV: 96.7 fL (ref 80.0–100.0)
Platelets: 299 10*3/uL (ref 150–400)
RBC: 4.2 MIL/uL (ref 3.87–5.11)
RDW: 11.9 % (ref 11.5–15.5)
WBC: 8.6 10*3/uL (ref 4.0–10.5)
nRBC: 0 % (ref 0.0–0.2)

## 2019-11-08 LAB — PROTIME-INR
INR: 1 (ref 0.8–1.2)
Prothrombin Time: 13.1 seconds (ref 11.4–15.2)

## 2019-11-08 LAB — HEMOGLOBIN A1C
Hgb A1c MFr Bld: 13.5 % — ABNORMAL HIGH (ref 4.8–5.6)
Mean Plasma Glucose: 340.75 mg/dL

## 2019-11-08 LAB — SURGICAL PCR SCREEN
MRSA, PCR: NEGATIVE
Staphylococcus aureus: POSITIVE — AB

## 2019-11-08 LAB — APTT: aPTT: 31 seconds (ref 24–36)

## 2019-11-08 LAB — GLUCOSE, CAPILLARY: Glucose-Capillary: 356 mg/dL — ABNORMAL HIGH (ref 70–99)

## 2019-11-08 NOTE — Telephone Encounter (Signed)
Karoline Caldwell with [facility] Pre-op department called stating patient was in today for her Pre-op appt and patient had Blood Glucose reading at 401 and A1C was 13.5. He wanted to know how Dr.Caine would like to proceed with this prior to surgery scheduled on 11/12/2019.   I spoke with Dr. Vella Redhead and informed him of patient's readings. Per Dr. Vella Redhead pt needs to go see PCP to discuss DM treatment and under better control before he is able to proceed with her surgery. He advised if patient comes into surgery and her number are still elevated the surgery will be canceled.  I contacted patient to inform her that she will need to follow up with her PCP today or Monday prior to surgery on Tuesday to discuss her Blood sugar levels and treatment. Patient states she will reach out to PCP to schedule an appt and will contact us to let us know the outcome. I advised I will inform surgery scheduling department so that everyone is aware. Patient and her daughter both voiced their understanding.

## 2019-11-08 NOTE — Progress Notes (Addendum)
PCP - Yvette Rack PA Cardiologist - [name]   Chest x-ray - na EKG - 09/10/19 Stress Test - > 5 yrs. ECHO - 04/18/18 Cardiac Cath - na  Sleep Study - na  Fasting Blood Sugar - 80 Checks Blood Sugar _____ times a day, ran out of [place]. Pt. Will call the doctor today  Notified Karoline Caldwell, Pa of pt's blood sugar 356. Pt. Stated she forgot to take her insulin. Pt. Will take it when she gets home. Pt's PCP manages her diabetes.  Blood Thinner Instructions:na Aspirin Instructions: continue    COVID TEST- 11/11/19   Anesthesia review: ekg/cbg,clearance from Dr. Fletcher Anon  Patient denies shortness of breath, fever, cough and chest pain at PAT appointment   All instructions explained to the patient, with a verbal understanding of the material. Patient agrees to go over the instructions while at home for a better understanding. Patient also instructed to self quarantine after being tested for COVID-19. The opportunity to ask questions was provided.

## 2019-11-11 ENCOUNTER — Ambulatory Visit (INDEPENDENT_AMBULATORY_CARE_PROVIDER_SITE_OTHER): Payer: 59 | Admitting: Physician Assistant

## 2019-11-11 ENCOUNTER — Other Ambulatory Visit: Payer: Self-pay

## 2019-11-11 ENCOUNTER — Encounter: Payer: Self-pay | Admitting: Physician Assistant

## 2019-11-11 ENCOUNTER — Other Ambulatory Visit (HOSPITAL_COMMUNITY)
Admission: RE | Admit: 2019-11-11 | Discharge: 2019-11-11 | Disposition: A | Discharge status: Home / Self Care | Payer: 59 | Source: Ambulatory Visit | Attending: Vascular Surgery | Admitting: Vascular Surgery

## 2019-11-11 VITALS — Ht 64.0 in | Wt 212.0 lb

## 2019-11-11 DIAGNOSIS — Z20822 Contact with and (suspected) exposure to covid-19: Secondary | ICD-10-CM | POA: Insufficient documentation

## 2019-11-11 DIAGNOSIS — E1165 Type 2 diabetes mellitus with hyperglycemia: Secondary | ICD-10-CM | POA: Diagnosis not present

## 2019-11-11 DIAGNOSIS — E1159 Type 2 diabetes mellitus with other circulatory complications: Secondary | ICD-10-CM | POA: Diagnosis not present

## 2019-11-11 DIAGNOSIS — I739 Peripheral vascular disease, unspecified: Secondary | ICD-10-CM

## 2019-11-11 DIAGNOSIS — I10 Essential (primary) hypertension: Secondary | ICD-10-CM | POA: Diagnosis not present

## 2019-11-11 DIAGNOSIS — Z01812 Encounter for preprocedural laboratory examination: Secondary | ICD-10-CM | POA: Insufficient documentation

## 2019-11-11 LAB — SARS CORONAVIRUS 2 (TAT 6-24 HRS): SARS Coronavirus 2: NEGATIVE

## 2019-11-11 MED ORDER — FREESTYLE LIBRE 14 DAY SENSOR MISC: 1.0000 | 3 refills | Status: DC

## 2019-11-11 MED ORDER — VANCOMYCIN HCL 1500 MG/300ML IV SOLN
1500.0000 mg | INTRAVENOUS | Status: DC
  Filled 2019-11-11: qty 300

## 2019-11-11 MED ORDER — OZEMPIC (0.25 OR 0.5 MG/DOSE) 2 MG/1.5ML ~~LOC~~ SOPN: PEN_INJECTOR | SUBCUTANEOUS | 3 refills | Status: DC

## 2019-11-11 NOTE — Progress Notes (Addendum)
Anesthesia Chart Review:  Patient follows with cardiology for history of peripheral artery disease, inappropriate sinus tachycardia, hyperlipidemia, hypertension.  Recently has been seen by Dr. Fletcher Anon for eval of nonhealing ulceration on left small toe was found to have significant peripheral vascular disease and was referred to vascular surgery for left femoropopliteal bypass.  He provided cardiac clearance in note 10/15/2019, "Preoperative cardiovascular evaluation for anticipated vascular surgery: The patient has no anginal symptoms and has good functional capacity.  Recent EKG in June showed no ischemic findings.  The patient does not require ischemic cardiac evaluation before surgery and she should be considered at an overall low risk."  Patient was found to have markedly uncontrolled IDDM 2 at preop visit.  Blood sugar was 401 and A1c 13.5.  She admitted noncompliance with insulin.  This result was called to Dr. Claretha Cooper office and he advised patient should follow with PCP but surgery will not be postponed at this time.  Patient understands that uncontrolled hyperglycemia on day of surgery could be cause for cancellation.  Ability to proceed will depend on day of surgery glycemic control.  Patient and surgeon aware.  EKG 09/10/2019: Sinus tach with first-degree AV block.  Rate 110.  LAD.  Low voltage QRS.  Inferior infarct, age undetermined.  Cannot rule out anterior infarct, age undetermined.  TTE 04/18/2018: 1. The left ventricle appears to be normal in size, have normal wall  thickness, with normal systolic function of 04-54%. Echo evidence of  normal in diastolic filling patterns.  2. Right ventricular systolic pressure is could not be assessed.  3. The right ventricle is normal in size, has normal wall thickness and  normal systolic function.  4. Normal left atrial size.  5. Normal right atrial size.  6. Mild mitral annular calcification.  7. The mitral valve normal in structure and  function.  8. Normal tricuspid valve.  9. Aortic valve normal.  10. The ascending aorta and aortic rootare normal is size and structure.  11. No atrial level shunt detected by color flow Doppler.    Wynonia Musty [facility] Short Stay Center/Anesthesiology Phone [phone] 11/11/2019 9:31 AM

## 2019-11-11 NOTE — Progress Notes (Signed)
Telehealth office visit note for Julie Reid, PA-C- at Primary Care at [facility]   I connected with current patient today by telephone and verified that I am speaking with the correct person   . Location of the patient: Home . Location of the provider: Office - This visit type was conducted due to national recommendations for restrictions regarding the COVID-19 Pandemic (e.g. social distancing) in an effort to limit this patient's exposure and mitigate transmission in our community.    - No physical exam could be performed with this format, beyond that communicated to [name] by the patient/ family members as noted.   - Additionally my office staff/ schedulers were to discuss with the patient that there may be a monetary charge related to this service, depending on their medical insurance.  My understanding is that patient understood and consented to proceed.     _________________________________________________________________________________   History of Present Illness: Pt calls in to discuss diabetes mellitus. Pt reports her fasting blood sugars have been running from 150-250.  At last office visit Tyler Aas was decreased from 50 units to 25 units because her A1c was at goal.  Patient states Ozempic was also discontinued. Patient denies any dietary changes.  Denies increased thirst or urination.  She is scheduled for a bypass graft of the femoral-popliteal artery tomorrow for PAD and recent A1c was 13.5.  Also requesting refill of libre style glucose monitoring.  HTN: Followed by cardiology pt denies new onset chest pain, palpitations, dizziness or lower extremity swelling. Taking medication as directed without side effects. Checks BP at home and readings range in 107-109/60-70. Pt follows a low salt diet.    GAD 7 : Generalized Anxiety Score 09/05/2018 08/28/2018  Nervous, Anxious, on Edge 0 0  Control/stop worrying 0 0  Worry too much - different things 0 0  Trouble relaxing 0 0   Restless 0 0  Easily annoyed or irritable 0 0  Afraid - awful might happen 0 0  Total GAD 7 Score 0 0  Anxiety Difficulty Not difficult at all Not difficult at all    Depression screen [facility] 2/9 11/11/2019 12/26/2018 09/05/2018 08/28/2018 05/28/2018  Decreased Interest 0 0 0 0 3  Down, Depressed, Hopeless 0 0 0 0 0  PHQ - 2 Score 0 0 0 0 3  Altered sleeping 0 0 0 0 0  Tired, decreased energy 0 0 0 0 2  Change in appetite 0 0 0 0 1  Feeling bad or failure about yourself  0 0 0 0 0  Trouble concentrating 0 0 0 0 0  Moving slowly or fidgety/restless 0 0 0 0 0  Suicidal thoughts 0 0 0 0 0  PHQ-9 Score 0 0 0 0 6  Difficult doing work/chores - Not difficult at all Not difficult at all Not difficult at all -  Some recent data might be hidden      Impression and Recommendations:     1. Uncontrolled type 2 diabetes mellitus with hyperglycemia ([place])   2. Hypertension associated with diabetes ([place])   3. PAD (peripheral artery disease) ([name])-  See's Cards- Dr Elaina Pattee / that group for this condition     Uncontrolled type 2 diabetes mellitus with hyperglycemia: -A1c increased from 7.0 to 13.5 so will increase [place] to 50 units and will restart Ozempic. -Continue ambulatory glucose monitoring, and advised to notify the clinic if FBS consistently >160 to make medication adjustments. -Follow low carbohydrate and glucose diet.  Hypertension associated  with diabetes: -Stable -Followed by cardiology. -Continue ambulatory BP and pulse monitoring. -Follow DASH diet.  PAD: -Followed by cardiology and vascular/vein specialist. -Patient is scheduled for bypass graft to help improve symptoms. -On Pletal 50 mg BID  - As part of my medical decision making, I reviewed the following data within the [place] History obtained from pt /family, CMA notes reviewed and incorporated if applicable, Labs reviewed, Radiograph/ tests reviewed if applicable and OV notes from prior OV's with  me, as well as any other specialists she/he has seen since seeing me last, were all reviewed and used in my medical decision making process today.    - Additionally, when appropriate, discussion had with patient regarding our treatment plan, and their biases/concerns about that plan were used in my medical decision making today.    - The patient agreed with the plan and demonstrated an understanding of the instructions.   No barriers to understanding were identified.     - The patient was advised to call back or seek an in-person evaluation if the symptoms worsen or if the condition fails to improve as anticipated.   Return in about 3 months (around 02/11/2020) for DM, HTN, HLD.    No orders of the defined types were placed in this encounter.   Meds ordered this encounter  Medications  . Semaglutide,0.25 or 0.5MG /DOS, (OZEMPIC, 0.25 OR 0.5 MG/DOSE,) 2 MG/1.5ML SOPN    Sig: Inject 0.25 mg [place] once weekly for 4 weeks. Then 0.5 mg [place] once weekly.    Dispense:  1.5 mL    Refill:  3    Order Specific Question:   Supervising Provider    Answer:   Beatrice Lecher D [[id number]]  . Continuous Blood Gluc Sensor (FREESTYLE LIBRE 14 DAY SENSOR) MISC    Sig: 1 each by Other route See admin instructions. Test daily as directed and change every 14 days.    Dispense:  1 each    Refill:  3    Order Specific Question:   Supervising Provider    Answer:   Beatrice Lecher D [[id number]]    Medications Discontinued During This Encounter  Medication Reason  . Continuous Blood Gluc Sensor (FREESTYLE LIBRE 14 DAY SENSOR) MISC Reorder       Time spent on visit including pre-visit chart review and post-visit care was 15 minutes.      The [place] was signed into law in 2016 which includes the topic of electronic health records.  This provides immediate access to information in MyChart.  This includes consultation notes, operative notes, office notes, lab results and pathology reports.  If  you have any questions about what you read please let us know at your next visit or call us at the office.  We are right here with you.  Note:  This note was prepared with assistance of Dragon voice recognition software. Occasional wrong-word or sound-a-like substitutions may have occurred due to the inherent limitations of voice recognition software.   __________________________________________________________________________________     Patient Care Team    Relationship Specialty Notifications Start End  Julie Prince, [place] PCP - General   07/21/19   Skeet Latch, MD PCP - Cardiology Cardiology Admissions 05/29/18   Pyrtle, Lajuan Lines, MD Consulting Physician Gastroenterology  07/13/17   Delrae Rend, MD Consulting Physician Endocrinology  07/13/17   Emily Filbert, MD Consulting Physician Obstetrics and Gynecology  07/13/17   Christella Hartigan, RD Dietitian Dietician  09/05/17      -  Vitals obtained; medications/ allergies reconciled;  personal medical, social, Sx etc.histories were updated by CMA, reviewed by me and are reflected in chart   Patient Active Problem List   Diagnosis Date Noted  . Stress due to illness of family member 09/05/2018  . PAD (peripheral artery disease) [facility])-  See's Cards- Dr Elaina Pattee [place]/ that group for this condition 05/28/2018  . Foot pain, left 04/20/2018  . Trigger ring finger 04/20/2018  . Diabetic retinopathy associated with diabetes mellitus due to underlying condition ([place]) 04/20/2018  . 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 Diabetes mellitus due to underlying condition with stage 3 chronic kidney disease, without long-term current use of insulin ([place]) 07/12/2017  . Mixed diabetic hyperlipidemia associated with type 2 diabetes mellitus ([place]) 07/12/2017  . Hypertension associated with diabetes ([place]) 07/12/2017  . Obesity, Class III, morbid obesity  ([place]) 07/12/2017  . H/O medication noncompliance 07/12/2017  . Barrett's esophagus 03/11/2016   . 1st degree AV block 03/01/2016  . Tachycardia with hypertension 03/01/2016  . Dysphagia 02/10/2016  . Vulvar abscess   . Sepsis ([place]) 06/21/2015  . GERD (gastroesophageal reflux disease) 07/31/2014  . Adenomatous polyp of colon 04/03/2014  . Allergic rhinitis 01/23/2014  . CTS (carpal tunnel syndrome) 10/03/2013  . Uncontrolled type 2 diabetes mellitus with hyperglycemia ([place]) 10/03/2013  . Low back pain 10/03/2013  . Lumbar arthropathy 10/03/2013  . Lumbar degenerative disc disease 10/03/2013  . Neuropathy 10/03/2013  . Osteoarthritis 10/03/2013  . 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 Tenosynovitis of foot 10/03/2013  . Currently controlled diabetes mellitus type 2 with complications ([place]) 16/12/9602  . Hyperlipidemia 09/12/2013  . Essential hypertension, benign 09/12/2013  . Diabetic peripheral neuropathy associated with type 2 diabetes mellitus ([place]) 09/12/2013     Current Meds  Medication Sig  . aspirin 81 MG chewable tablet Chew 81 mg by mouth daily.   Marland Kitchen atorvastatin (LIPITOR) 40 MG tablet Take 1 tablet (40 mg total) by mouth at bedtime.  . Cholecalciferol (VITAMIN D3) 50 MCG (2000 UT) TABS Take 2,000 Units by mouth daily.   . cilostazol (PLETAL) 50 MG tablet TAKE 1 TABLET BY MOUTH TWICE A DAY (Patient taking differently: Take 50 mg by mouth 2 (two) times daily. )  . metoprolol tartrate (LOPRESSOR) 50 MG tablet Take 1.5 tablets (75 mg total) by mouth 2 (two) times daily. (Patient taking differently: Take 50 mg by mouth 2 (two) times daily. )   Current Facility-Administered Medications for the 11/11/19 encounter (Office Visit) with Julie Reid, PA-C  Medication  . 0.9 %  sodium chloride infusion     Allergies:  Allergies  Allergen Reactions  . Penicillins Hives and Shortness Of Breath  . Metformin And Related Diarrhea     ROS:  See above HPI for pertinent positives and negatives   Objective:   Height 5\' 4"  (1.626 m), weight 212 lb (96.2 kg).  (if some  vitals are omitted, this means that patient was UNABLE to obtain them even though they were asked to get them prior to OV today.  They were asked to call us at their earliest convenience with these once obtained. ) General: A & O * 3; sounds in no acute distress; in usual state of health.  Respiratory: speaking in full sentences, no conversational dyspnea;  Psych: insight appears good, mood- appears full

## 2019-11-11 NOTE — Anesthesia Preprocedure Evaluation (Addendum)
Anesthesia Evaluation  Patient identified by MRN, date of birth, ID band Patient awake    Reviewed: Allergy & Precautions, NPO status , Patient's Chart, lab work & pertinent test results  History of Anesthesia Complications (+) PONV and history of anesthetic complications  Airway Mallampati: I  TM Distance: >3 FB Neck ROM: Full    Dental  (+) Edentulous Upper, Dental Advisory Given   Pulmonary neg pulmonary ROS, former smoker,    Pulmonary exam normal        Cardiovascular hypertension, Pt. on home beta blockers + Peripheral Vascular Disease  Normal cardiovascular exam     Neuro/Psych negative neurological ROS     GI/Hepatic Neg liver ROS, GERD  ,  Endo/Other  diabetes, Poorly Controlled, Type 2, Insulin Dependent  Renal/GU Renal InsufficiencyRenal disease     Musculoskeletal negative musculoskeletal ROS (+)   Abdominal   Peds  Hematology negative hematology ROS (+)   Anesthesia Other Findings   Reproductive/Obstetrics                         Anesthesia Physical Anesthesia Plan  ASA: III  Anesthesia Plan: General   Post-op Pain Management:    Induction: Intravenous  PONV Risk Score and Plan: 4 or greater and Ondansetron, Dexamethasone, Midazolam and Scopolamine patch - Pre-op  Airway Management Planned: Oral ETT  Additional Equipment:   Intra-op Plan:   Post-operative Plan: Extubation in OR  Informed Consent: I have reviewed the patients History and Physical, chart, labs and discussed the procedure including the risks, benefits and alternatives for the proposed anesthesia with the patient or authorized representative who has indicated his/her understanding and acceptance.     Dental advisory given  Plan Discussed with: Anesthesiologist, CRNA and Surgeon  Anesthesia Plan Comments: (PAT note by Karoline Caldwell, PA-C: Patient follows with cardiology for history of peripheral  artery disease, inappropriate sinus tachycardia, hyperlipidemia, hypertension.  Recently has been seen by Dr. Fletcher Anon for eval of nonhealing ulceration on left small toe was found to have significant peripheral vascular disease and was referred to vascular surgery for left femoropopliteal bypass.  He provided cardiac clearance in note 10/15/2019, "Preoperative cardiovascular evaluation for anticipated vascular surgery: The patient has no anginal symptoms and has good functional capacity.  Recent EKG in June showed no ischemic findings.  The patient does not require ischemic cardiac evaluation before surgery and she should be considered at an overall low risk."  Patient was found to have markedly uncontrolled IDDM 2 at preop visit.  Blood sugar was 401 and A1c 13.5.  She admitted noncompliance with insulin.  This result was called to Dr. Claretha Cooper office and he advised patient should follow with PCP but surgery will not be postponed at this time.  Patient understands that uncontrolled hyperglycemia on day of surgery could be cause for cancellation.  Ability to proceed will depend on day of surgery glycemic control.  Patient and surgeon aware.  EKG 09/10/2019: Sinus tach with first-degree AV block.  Rate 110.  LAD.  Low voltage QRS.  Inferior infarct, age undetermined.  Cannot rule out anterior infarct, age undetermined.  TTE 04/18/2018: 1. The left ventricle appears to be normal in size, have normal wall  thickness, with normal systolic function of 25-36%. Echo evidence of  normal in diastolic filling patterns.  2. Right ventricular systolic pressure is could not be assessed.  3. The right ventricle is normal in size, has normal wall thickness and  normal systolic function.  4. Normal left atrial size.  5. Normal right atrial size.  6. Mild mitral annular calcification.  7. The mitral valve normal in structure and function.  8. Normal tricuspid valve.  9. Aortic valve normal.  10. The ascending  aorta and aortic rootare normal is size and structure.  11. No atrial level shunt detected by color flow Doppler. )     Anesthesia Quick Evaluation

## 2019-11-12 ENCOUNTER — Encounter (HOSPITAL_COMMUNITY): Payer: Self-pay | Admitting: Vascular Surgery

## 2019-11-12 ENCOUNTER — Inpatient Hospital Stay (HOSPITAL_COMMUNITY): Payer: 59 | Admitting: Physician Assistant

## 2019-11-12 ENCOUNTER — Encounter (HOSPITAL_COMMUNITY)
Admission: RE | Disposition: A | Discharge status: Home / Self Care | Payer: Self-pay | Source: Home / Self Care | Attending: Vascular Surgery

## 2019-11-12 ENCOUNTER — Inpatient Hospital Stay (HOSPITAL_COMMUNITY): Payer: 59 | Admitting: Anesthesiology

## 2019-11-12 ENCOUNTER — Other Ambulatory Visit: Payer: Self-pay

## 2019-11-12 ENCOUNTER — Inpatient Hospital Stay (HOSPITAL_COMMUNITY)
Admission: RE | Admit: 2019-11-12 | Discharge: 2019-11-17 | DRG: 253 | Disposition: A | Discharge status: Home / Self Care | Payer: 59 | Attending: Vascular Surgery | Admitting: Vascular Surgery

## 2019-11-12 DIAGNOSIS — E1152 Type 2 diabetes mellitus with diabetic peripheral angiopathy with gangrene: Principal | ICD-10-CM | POA: Diagnosis present

## 2019-11-12 DIAGNOSIS — I10 Essential (primary) hypertension: Secondary | ICD-10-CM | POA: Diagnosis present

## 2019-11-12 DIAGNOSIS — Z91128 Patient's intentional underdosing of medication regimen for other reason: Secondary | ICD-10-CM

## 2019-11-12 DIAGNOSIS — Z90721 Acquired absence of ovaries, unilateral: Secondary | ICD-10-CM

## 2019-11-12 DIAGNOSIS — Z8249 Family history of ischemic heart disease and other diseases of the circulatory system: Secondary | ICD-10-CM

## 2019-11-12 DIAGNOSIS — Z20822 Contact with and (suspected) exposure to covid-19: Secondary | ICD-10-CM | POA: Diagnosis present

## 2019-11-12 DIAGNOSIS — T8131XA Disruption of external operation (surgical) wound, not elsewhere classified, initial encounter: Secondary | ICD-10-CM | POA: Diagnosis not present

## 2019-11-12 DIAGNOSIS — Z7982 Long term (current) use of aspirin: Secondary | ICD-10-CM

## 2019-11-12 DIAGNOSIS — I70262 Atherosclerosis of native arteries of extremities with gangrene, left leg: Secondary | ICD-10-CM

## 2019-11-12 DIAGNOSIS — T383X6A Underdosing of insulin and oral hypoglycemic [antidiabetic] drugs, initial encounter: Secondary | ICD-10-CM | POA: Diagnosis present

## 2019-11-12 DIAGNOSIS — Z9079 Acquired absence of other genital organ(s): Secondary | ICD-10-CM

## 2019-11-12 DIAGNOSIS — I70245 Atherosclerosis of native arteries of left leg with ulceration of other part of foot: Secondary | ICD-10-CM | POA: Diagnosis present

## 2019-11-12 DIAGNOSIS — K219 Gastro-esophageal reflux disease without esophagitis: Secondary | ICD-10-CM | POA: Diagnosis present

## 2019-11-12 DIAGNOSIS — Z6836 Body mass index (BMI) 36.0-36.9, adult: Secondary | ICD-10-CM

## 2019-11-12 DIAGNOSIS — E669 Obesity, unspecified: Secondary | ICD-10-CM | POA: Diagnosis present

## 2019-11-12 DIAGNOSIS — Z888 Allergy status to other drugs, medicaments and biological substances status: Secondary | ICD-10-CM

## 2019-11-12 DIAGNOSIS — E785 Hyperlipidemia, unspecified: Secondary | ICD-10-CM | POA: Diagnosis present

## 2019-11-12 DIAGNOSIS — Z87891 Personal history of nicotine dependence: Secondary | ICD-10-CM

## 2019-11-12 DIAGNOSIS — Z794 Long term (current) use of insulin: Secondary | ICD-10-CM | POA: Diagnosis not present

## 2019-11-12 DIAGNOSIS — I779 Disorder of arteries and arterioles, unspecified: Secondary | ICD-10-CM | POA: Diagnosis present

## 2019-11-12 DIAGNOSIS — Z79899 Other long term (current) drug therapy: Secondary | ICD-10-CM

## 2019-11-12 DIAGNOSIS — Y832 Surgical operation with anastomosis, bypass or graft as the cause of abnormal reaction of the patient, or of later complication, without mention of misadventure at the time of the procedure: Secondary | ICD-10-CM | POA: Diagnosis not present

## 2019-11-12 DIAGNOSIS — L97529 Non-pressure chronic ulcer of other part of left foot with unspecified severity: Secondary | ICD-10-CM | POA: Diagnosis present

## 2019-11-12 DIAGNOSIS — Z880 Allergy status to penicillin: Secondary | ICD-10-CM | POA: Diagnosis not present

## 2019-11-12 DIAGNOSIS — I959 Hypotension, unspecified: Secondary | ICD-10-CM | POA: Diagnosis not present

## 2019-11-12 HISTORY — PX: FEMORAL-POPLITEAL BYPASS GRAFT: SHX937

## 2019-11-12 LAB — AEROBIC CULTURE W GRAM STAIN (SUPERFICIAL SPECIMEN)

## 2019-11-12 LAB — GLUCOSE, CAPILLARY
Glucose-Capillary: 184 mg/dL — ABNORMAL HIGH (ref 70–99)
Glucose-Capillary: 171 mg/dL — ABNORMAL HIGH (ref 70–99)
Glucose-Capillary: 326 mg/dL — ABNORMAL HIGH (ref 70–99)
Glucose-Capillary: 219 mg/dL — ABNORMAL HIGH (ref 70–99)

## 2019-11-12 LAB — ABO/RH: ABO/RH(D): O POS

## 2019-11-12 SURGERY — BYPASS GRAFT FEMORAL-POPLITEAL ARTERY
Anesthesia: General | Laterality: Left

## 2019-11-12 MED ORDER — ONDANSETRON HCL 4 MG/2ML IJ SOLN
4.0000 mg | Freq: Four times a day (QID) | INTRAMUSCULAR | Status: DC | PRN
  Administered 2019-11-13: 4 mg via INTRAVENOUS
  Filled 2019-11-12: qty 2

## 2019-11-12 MED ORDER — POTASSIUM CHLORIDE CRYS ER 20 MEQ PO TBCR: 20.0000 meq | EXTENDED_RELEASE_TABLET | Freq: Every day | ORAL | Status: DC | PRN

## 2019-11-12 MED ORDER — LABETALOL HCL 5 MG/ML IV SOLN
10.0000 mg | INTRAVENOUS | Status: DC | PRN
  Administered 2019-11-12: 10 mg via INTRAVENOUS

## 2019-11-12 MED ORDER — ORAL CARE MOUTH RINSE: 15.0000 mL | Freq: Once | OROMUCOSAL | Status: AC

## 2019-11-12 MED ORDER — HEPARIN SODIUM (PORCINE) 1000 UNIT/ML IJ SOLN
INTRAMUSCULAR | Status: DC | PRN
  Administered 2019-11-12: 10000 [IU] via INTRAVENOUS

## 2019-11-12 MED ORDER — CHLORHEXIDINE GLUCONATE CLOTH 2 % EX PADS: 6.0000 | MEDICATED_PAD | Freq: Once | CUTANEOUS | Status: DC

## 2019-11-12 MED ORDER — DOCUSATE SODIUM 100 MG PO CAPS
100.0000 mg | ORAL_CAPSULE | Freq: Every day | ORAL | Status: DC
  Administered 2019-11-13 – 2019-11-17 (×5): 100 mg via ORAL
  Filled 2019-11-12 (×6): qty 1

## 2019-11-12 MED ORDER — VANCOMYCIN HCL IN DEXTROSE 1-5 GM/200ML-% IV SOLN
1000.0000 mg | Freq: Two times a day (BID) | INTRAVENOUS | Status: AC
  Administered 2019-11-12 – 2019-11-13 (×2): 1000 mg via INTRAVENOUS
  Filled 2019-11-12 (×3): qty 200

## 2019-11-12 MED ORDER — HEPARIN SODIUM (PORCINE) 1000 UNIT/ML IJ SOLN
INTRAMUSCULAR | Status: AC
  Filled 2019-11-12: qty 1

## 2019-11-12 MED ORDER — PAPAVERINE HCL 30 MG/ML IJ SOLN
INTRAMUSCULAR | Status: AC
  Filled 2019-11-12: qty 2

## 2019-11-12 MED ORDER — PHENYLEPHRINE HCL-NACL 10-0.9 MG/250ML-% IV SOLN
INTRAVENOUS | Status: DC | PRN
  Administered 2019-11-12: 25 ug/min via INTRAVENOUS

## 2019-11-12 MED ORDER — METOPROLOL TARTRATE 50 MG PO TABS
50.0000 mg | ORAL_TABLET | Freq: Two times a day (BID) | ORAL | Status: DC
  Administered 2019-11-12 – 2019-11-17 (×9): 50 mg via ORAL
  Filled 2019-11-12 (×9): qty 1

## 2019-11-12 MED ORDER — SODIUM CHLORIDE 0.9 % IV SOLN
500.0000 mL | Freq: Once | INTRAVENOUS | Status: AC | PRN
  Administered 2019-11-12: 500 mL via INTRAVENOUS

## 2019-11-12 MED ORDER — INSULIN GLARGINE 100 UNIT/ML ~~LOC~~ SOLN
50.0000 [IU] | Freq: Every day | SUBCUTANEOUS | Status: DC
  Administered 2019-11-12 – 2019-11-16 (×5): 50 [IU] via SUBCUTANEOUS
  Filled 2019-11-12 (×6): qty 0.5

## 2019-11-12 MED ORDER — PROMETHAZINE HCL 25 MG/ML IJ SOLN
INTRAMUSCULAR | Status: AC
  Filled 2019-11-12: qty 1

## 2019-11-12 MED ORDER — LIDOCAINE 2% (20 MG/ML) 5 ML SYRINGE
INTRAMUSCULAR | Status: DC | PRN
  Administered 2019-11-12: 100 mg via INTRAVENOUS

## 2019-11-12 MED ORDER — ACETAMINOPHEN 325 MG RE SUPP
325.0000 mg | RECTAL | Status: DC | PRN
  Filled 2019-11-12: qty 2

## 2019-11-12 MED ORDER — GUAIFENESIN-DM 100-10 MG/5ML PO SYRP: 15.0000 mL | ORAL_SOLUTION | ORAL | Status: DC | PRN

## 2019-11-12 MED ORDER — PROMETHAZINE HCL 25 MG/ML IJ SOLN
6.2500 mg | INTRAMUSCULAR | Status: DC | PRN
  Administered 2019-11-12: 12.5 mg via INTRAVENOUS

## 2019-11-12 MED ORDER — PROTAMINE SULFATE 10 MG/ML IV SOLN
INTRAVENOUS | Status: AC
  Filled 2019-11-12: qty 5

## 2019-11-12 MED ORDER — CELECOXIB 200 MG PO CAPS
200.0000 mg | ORAL_CAPSULE | Freq: Once | ORAL | Status: AC
  Administered 2019-11-12: 200 mg via ORAL
  Filled 2019-11-12: qty 1

## 2019-11-12 MED ORDER — OXYCODONE-ACETAMINOPHEN 5-325 MG PO TABS
ORAL_TABLET | ORAL | Status: AC
  Filled 2019-11-12: qty 1

## 2019-11-12 MED ORDER — CHLORHEXIDINE GLUCONATE 0.12 % MT SOLN
15.0000 mL | Freq: Once | OROMUCOSAL | Status: AC
  Administered 2019-11-12: 15 mL via OROMUCOSAL
  Filled 2019-11-12: qty 15

## 2019-11-12 MED ORDER — PHENOL 1.4 % MT LIQD: 1.0000 | OROMUCOSAL | Status: DC | PRN

## 2019-11-12 MED ORDER — ACETAMINOPHEN 500 MG PO TABS
1000.0000 mg | ORAL_TABLET | Freq: Once | ORAL | Status: AC
  Administered 2019-11-12: 1000 mg via ORAL
  Filled 2019-11-12: qty 2

## 2019-11-12 MED ORDER — OXYCODONE-ACETAMINOPHEN 5-325 MG PO TABS
1.0000 | ORAL_TABLET | ORAL | Status: DC | PRN
  Administered 2019-11-12: 1 via ORAL
  Administered 2019-11-13 – 2019-11-16 (×4): 2 via ORAL
  Filled 2019-11-12 (×4): qty 2

## 2019-11-12 MED ORDER — SCOPOLAMINE 1 MG/3DAYS TD PT72
1.0000 | MEDICATED_PATCH | TRANSDERMAL | Status: DC
  Administered 2019-11-12: 1.5 mg via TRANSDERMAL
  Filled 2019-11-12: qty 1

## 2019-11-12 MED ORDER — SODIUM CHLORIDE 0.9 % IV SOLN
INTRAVENOUS | Status: AC
  Filled 2019-11-12: qty 1.2

## 2019-11-12 MED ORDER — DEXAMETHASONE SODIUM PHOSPHATE 10 MG/ML IJ SOLN
INTRAMUSCULAR | Status: AC
  Filled 2019-11-12: qty 1

## 2019-11-12 MED ORDER — PHENYLEPHRINE 40 MCG/ML (10ML) SYRINGE FOR IV PUSH (FOR BLOOD PRESSURE SUPPORT)
PREFILLED_SYRINGE | INTRAVENOUS | Status: DC | PRN
  Administered 2019-11-12 (×5): 80 ug via INTRAVENOUS

## 2019-11-12 MED ORDER — ONDANSETRON HCL 4 MG/2ML IJ SOLN
INTRAMUSCULAR | Status: DC | PRN
  Administered 2019-11-12: 4 mg via INTRAVENOUS

## 2019-11-12 MED ORDER — INSULIN ASPART 100 UNIT/ML ~~LOC~~ SOLN
0.0000 [IU] | Freq: Three times a day (TID) | SUBCUTANEOUS | Status: DC
  Administered 2019-11-12: 11 [IU] via SUBCUTANEOUS
  Administered 2019-11-13 – 2019-11-15 (×2): 2 [IU] via SUBCUTANEOUS

## 2019-11-12 MED ORDER — FENTANYL CITRATE (PF) 100 MCG/2ML IJ SOLN: 25.0000 ug | INTRAMUSCULAR | Status: DC | PRN

## 2019-11-12 MED ORDER — FREESTYLE LIBRE 14 DAY SENSOR MISC: 1.0000 | Status: DC

## 2019-11-12 MED ORDER — METOPROLOL TARTRATE 5 MG/5ML IV SOLN: 2.0000 mg | INTRAVENOUS | Status: DC | PRN

## 2019-11-12 MED ORDER — HYDRALAZINE HCL 20 MG/ML IJ SOLN: 5.0000 mg | INTRAMUSCULAR | Status: DC | PRN

## 2019-11-12 MED ORDER — MIDAZOLAM HCL 5 MG/5ML IJ SOLN
INTRAMUSCULAR | Status: DC | PRN
  Administered 2019-11-12: 2 mg via INTRAVENOUS

## 2019-11-12 MED ORDER — SENNOSIDES-DOCUSATE SODIUM 8.6-50 MG PO TABS: 1.0000 | ORAL_TABLET | Freq: Every evening | ORAL | Status: DC | PRN

## 2019-11-12 MED ORDER — LACTATED RINGERS IV SOLN: INTRAVENOUS | Status: DC

## 2019-11-12 MED ORDER — ALUM & MAG HYDROXIDE-SIMETH 200-200-20 MG/5ML PO SUSP: 15.0000 mL | ORAL | Status: DC | PRN

## 2019-11-12 MED ORDER — PANTOPRAZOLE SODIUM 40 MG PO TBEC
40.0000 mg | DELAYED_RELEASE_TABLET | Freq: Every day | ORAL | Status: DC
  Administered 2019-11-12 – 2019-11-17 (×6): 40 mg via ORAL
  Filled 2019-11-12 (×7): qty 1

## 2019-11-12 MED ORDER — DEXAMETHASONE SODIUM PHOSPHATE 4 MG/ML IJ SOLN
INTRAMUSCULAR | Status: DC | PRN
  Administered 2019-11-12: 4 mg via INTRAVENOUS

## 2019-11-12 MED ORDER — INSULIN DEGLUDEC 100 UNIT/ML ~~LOC~~ SOPN
50.0000 [IU] | PEN_INJECTOR | Freq: Every day | SUBCUTANEOUS | Status: DC
  Filled 2019-11-12: qty 3

## 2019-11-12 MED ORDER — ONDANSETRON HCL 4 MG/2ML IJ SOLN
INTRAMUSCULAR | Status: AC
  Filled 2019-11-12: qty 2

## 2019-11-12 MED ORDER — CILOSTAZOL 50 MG PO TABS
50.0000 mg | ORAL_TABLET | Freq: Two times a day (BID) | ORAL | Status: DC
  Administered 2019-11-12 – 2019-11-17 (×10): 50 mg via ORAL
  Filled 2019-11-12 (×11): qty 1

## 2019-11-12 MED ORDER — PROPOFOL 10 MG/ML IV BOLUS
INTRAVENOUS | Status: AC
  Filled 2019-11-12: qty 20

## 2019-11-12 MED ORDER — ACETAMINOPHEN 325 MG PO TABS
325.0000 mg | ORAL_TABLET | ORAL | Status: DC | PRN
  Administered 2019-11-14 (×2): 650 mg via ORAL
  Filled 2019-11-12 (×3): qty 2

## 2019-11-12 MED ORDER — LIDOCAINE 2% (20 MG/ML) 5 ML SYRINGE
INTRAMUSCULAR | Status: AC
  Filled 2019-11-12: qty 5

## 2019-11-12 MED ORDER — HEPARIN SODIUM (PORCINE) 5000 UNIT/ML IJ SOLN
5000.0000 [IU] | Freq: Three times a day (TID) | INTRAMUSCULAR | Status: DC
  Administered 2019-11-13 – 2019-11-17 (×13): 5000 [IU] via SUBCUTANEOUS
  Filled 2019-11-12 (×14): qty 1

## 2019-11-12 MED ORDER — VANCOMYCIN HCL IN DEXTROSE 1-5 GM/200ML-% IV SOLN
INTRAVENOUS | Status: AC
  Administered 2019-11-12: 5 mg
  Filled 2019-11-12: qty 200

## 2019-11-12 MED ORDER — MORPHINE SULFATE (PF) 2 MG/ML IV SOLN: 2.0000 mg | INTRAVENOUS | Status: DC | PRN

## 2019-11-12 MED ORDER — OXYCODONE-ACETAMINOPHEN 5-325 MG PO TABS
ORAL_TABLET | ORAL | Status: AC
  Administered 2019-11-12: 1
  Filled 2019-11-12: qty 2

## 2019-11-12 MED ORDER — VANCOMYCIN HCL 1000 MG IV SOLR
INTRAVENOUS | Status: DC | PRN
  Administered 2019-11-12: 1000 mg via INTRAVENOUS

## 2019-11-12 MED ORDER — FENTANYL CITRATE (PF) 250 MCG/5ML IJ SOLN
INTRAMUSCULAR | Status: AC
  Filled 2019-11-12: qty 5

## 2019-11-12 MED ORDER — SUGAMMADEX SODIUM 200 MG/2ML IV SOLN
INTRAVENOUS | Status: DC | PRN
  Administered 2019-11-12: 200 mg via INTRAVENOUS

## 2019-11-12 MED ORDER — ASPIRIN 81 MG PO CHEW
81.0000 mg | CHEWABLE_TABLET | Freq: Every day | ORAL | Status: DC
  Administered 2019-11-12 – 2019-11-17 (×6): 81 mg via ORAL
  Filled 2019-11-12 (×7): qty 1

## 2019-11-12 MED ORDER — ROCURONIUM BROMIDE 10 MG/ML (PF) SYRINGE
PREFILLED_SYRINGE | INTRAVENOUS | Status: AC
  Filled 2019-11-12: qty 10

## 2019-11-12 MED ORDER — INSULIN ASPART 100 UNIT/ML ~~LOC~~ SOLN
SUBCUTANEOUS | Status: AC
  Filled 2019-11-12: qty 1

## 2019-11-12 MED ORDER — MAGNESIUM SULFATE 2 GM/50ML IV SOLN: 2.0000 g | Freq: Every day | INTRAVENOUS | Status: DC | PRN

## 2019-11-12 MED ORDER — HEMOSTATIC AGENTS (NO CHARGE) OPTIME
TOPICAL | Status: DC | PRN
  Administered 2019-11-12: 1 via TOPICAL

## 2019-11-12 MED ORDER — SODIUM CHLORIDE 0.9 % IV SOLN: INTRAVENOUS | Status: DC

## 2019-11-12 MED ORDER — PROPOFOL 10 MG/ML IV BOLUS
INTRAVENOUS | Status: DC | PRN
  Administered 2019-11-12: 100 mg via INTRAVENOUS
  Administered 2019-11-12: 30 mg via INTRAVENOUS

## 2019-11-12 MED ORDER — PROTAMINE SULFATE 10 MG/ML IV SOLN
INTRAVENOUS | Status: DC | PRN
  Administered 2019-11-12 (×5): 10 mg via INTRAVENOUS

## 2019-11-12 MED ORDER — 0.9 % SODIUM CHLORIDE (POUR BTL) OPTIME
TOPICAL | Status: DC | PRN
  Administered 2019-11-12: 1000 mL

## 2019-11-12 MED ORDER — LABETALOL HCL 5 MG/ML IV SOLN
INTRAVENOUS | Status: AC
  Filled 2019-11-12: qty 4

## 2019-11-12 MED ORDER — PHENYLEPHRINE 40 MCG/ML (10ML) SYRINGE FOR IV PUSH (FOR BLOOD PRESSURE SUPPORT)
PREFILLED_SYRINGE | INTRAVENOUS | Status: AC
  Filled 2019-11-12: qty 10

## 2019-11-12 MED ORDER — MIDAZOLAM HCL 2 MG/2ML IJ SOLN
INTRAMUSCULAR | Status: AC
  Filled 2019-11-12: qty 2

## 2019-11-12 MED ORDER — FENTANYL CITRATE (PF) 100 MCG/2ML IJ SOLN
INTRAMUSCULAR | Status: DC | PRN
Start: 2019-11-12 — End: 2019-11-12
  Administered 2019-11-12: 150 ug via INTRAVENOUS
  Administered 2019-11-12: 50 ug via INTRAVENOUS

## 2019-11-12 MED ORDER — ROCURONIUM BROMIDE 10 MG/ML (PF) SYRINGE
PREFILLED_SYRINGE | INTRAVENOUS | Status: DC | PRN
  Administered 2019-11-12: 100 mg via INTRAVENOUS

## 2019-11-12 MED ORDER — ATORVASTATIN CALCIUM 40 MG PO TABS
40.0000 mg | ORAL_TABLET | Freq: Every day | ORAL | Status: DC
  Administered 2019-11-12 – 2019-11-16 (×5): 40 mg via ORAL
  Filled 2019-11-12 (×5): qty 1

## 2019-11-12 MED ORDER — SODIUM CHLORIDE 0.9 % IV SOLN: INTRAVENOUS | Status: DC | PRN

## 2019-11-12 SURGICAL SUPPLY — 62 items
ADH SKN CLS APL DERMABOND .7 (GAUZE/BANDAGES/DRESSINGS) ×2
BANDAGE ESMARK 6X9 LF (GAUZE/BANDAGES/DRESSINGS) IMPLANT
BNDG CMPR 9X6 STRL LF SNTH (GAUZE/BANDAGES/DRESSINGS)
BNDG ESMARK 6X9 LF (GAUZE/BANDAGES/DRESSINGS)
CANISTER SUCT 3000ML PPV (MISCELLANEOUS) ×2 IMPLANT
CANNULA VESSEL 3MM 2 BLNT TIP (CANNULA) ×2 IMPLANT
CATH EMB 4FR 40CM (CATHETERS) ×2 IMPLANT
CLIP VESOCCLUDE MED 24/CT (CLIP) ×2 IMPLANT
CLIP VESOCCLUDE SM WIDE 24/CT (CLIP) ×4 IMPLANT
COVER WAND RF STERILE (DRAPES) IMPLANT
CUFF TOURN SGL QUICK 24 (TOURNIQUET CUFF)
CUFF TOURN SGL QUICK 34 (TOURNIQUET CUFF)
CUFF TOURN SGL QUICK 42 (TOURNIQUET CUFF) IMPLANT
CUFF TRNQT CYL 24X4X16.5-23 (TOURNIQUET CUFF) IMPLANT
CUFF TRNQT CYL 34X4.125X (TOURNIQUET CUFF) IMPLANT
DERMABOND ADVANCED (GAUZE/BANDAGES/DRESSINGS) ×2
DERMABOND ADVANCED .7 DNX12 (GAUZE/BANDAGES/DRESSINGS) ×2 IMPLANT
DRAIN CHANNEL 15F RND FF W/TCR (WOUND CARE) IMPLANT
DRAPE C-ARM 42X72 X-RAY (DRAPES) IMPLANT
DRAPE HALF SHEET 40X57 (DRAPES) IMPLANT
DRAPE X-RAY CASS 24X20 (DRAPES) IMPLANT
ELECT REM PT RETURN 9FT ADLT (ELECTROSURGICAL) ×2
ELECTRODE REM PT RTRN 9FT ADLT (ELECTROSURGICAL) ×1 IMPLANT
EVACUATOR SILICONE 100CC (DRAIN) IMPLANT
GLOVE BIO SURGEON STRL SZ 6.5 (GLOVE) ×6 IMPLANT
GLOVE BIO SURGEON STRL SZ7.5 (GLOVE) ×2 IMPLANT
GLOVE BIOGEL PI IND STRL 6.5 (GLOVE) ×5 IMPLANT
GLOVE BIOGEL PI IND STRL 7.0 (GLOVE) ×1 IMPLANT
GLOVE BIOGEL PI INDICATOR 6.5 (GLOVE) ×5
GLOVE BIOGEL PI INDICATOR 7.0 (GLOVE) ×1
GOWN STRL REUS W/ TWL LRG LVL3 (GOWN DISPOSABLE) ×3 IMPLANT
GOWN STRL REUS W/ TWL XL LVL3 (GOWN DISPOSABLE) ×1 IMPLANT
GOWN STRL REUS W/TWL LRG LVL3 (GOWN DISPOSABLE) ×6
GOWN STRL REUS W/TWL XL LVL3 (GOWN DISPOSABLE) ×2
HEMOSTAT SNOW SURGICEL 2X4 (HEMOSTASIS) IMPLANT
INSERT FOGARTY SM (MISCELLANEOUS) IMPLANT
KIT BASIN OR (CUSTOM PROCEDURE TRAY) ×2 IMPLANT
KIT TURNOVER KIT B (KITS) ×2 IMPLANT
MARKER GRAFT CORONARY BYPASS (MISCELLANEOUS) IMPLANT
NS IRRIG 1000ML POUR BTL (IV SOLUTION) ×4 IMPLANT
PACK PERIPHERAL VASCULAR (CUSTOM PROCEDURE TRAY) ×2 IMPLANT
PAD ARMBOARD 7.5X6 YLW CONV (MISCELLANEOUS) ×4 IMPLANT
SET COLLECT BLD 21X3/4 12 (NEEDLE) IMPLANT
SPONGE LAP 18X18 X RAY DECT (DISPOSABLE) ×2 IMPLANT
STOPCOCK 4 WAY LG BORE MALE ST (IV SETS) IMPLANT
SUT ETHILON 3 0 PS 1 (SUTURE) IMPLANT
SUT MNCRL AB 4-0 PS2 18 (SUTURE) ×6 IMPLANT
SUT PROLENE 5 0 C 1 24 (SUTURE) ×4 IMPLANT
SUT PROLENE 6 0 BV (SUTURE) ×8 IMPLANT
SUT PROLENE 7 0 BV 1 (SUTURE) IMPLANT
SUT SILK 2 0 SH (SUTURE) ×2 IMPLANT
SUT SILK 3 0 (SUTURE) ×2
SUT SILK 3-0 18XBRD TIE 12 (SUTURE) ×1 IMPLANT
SUT VIC AB 2-0 CT1 27 (SUTURE) ×4
SUT VIC AB 2-0 CT1 TAPERPNT 27 (SUTURE) ×2 IMPLANT
SUT VIC AB 3-0 SH 27 (SUTURE) ×10
SUT VIC AB 3-0 SH 27X BRD (SUTURE) ×5 IMPLANT
SYR 3ML LL SCALE MARK (SYRINGE) ×2 IMPLANT
TOWEL GREEN STERILE (TOWEL DISPOSABLE) ×2 IMPLANT
TRAY FOLEY MTR SLVR 16FR STAT (SET/KITS/TRAYS/PACK) ×2 IMPLANT
UNDERPAD 30X36 HEAVY ABSORB (UNDERPADS AND DIAPERS) ×2 IMPLANT
WATER STERILE IRR 1000ML POUR (IV SOLUTION) ×2 IMPLANT

## 2019-11-12 NOTE — Anesthesia Postprocedure Evaluation (Signed)
Anesthesia Post Note  Patient: Geologist, engineering  Procedure(s) Performed: BYPASS GRAFT FEMORAL-ABOVE KNEE POPLITEAL ARTERY (Left )     Patient location during evaluation: PACU Anesthesia Type: General Level of consciousness: sedated Pain management: pain level controlled Vital Signs Assessment: post-procedure vital signs reviewed and stable Respiratory status: spontaneous breathing and respiratory function stable Cardiovascular status: stable Postop Assessment: no apparent nausea or vomiting Anesthetic complications: yes   Encounter Complications  Complication Outcome Phase Comment  Vomiting N/A Intraprocedure PONV treated in PACU    Last Vitals:  Vitals:   11/12/19 1155 11/12/19 1210  BP: (!) 143/73 (!) 158/76  Pulse: 86 86  Resp: 13 11  Temp:    SpO2: 100% 100%    Last Pain:  Vitals:   11/12/19 1210  TempSrc:   PainSc: Asleep                 Lanah Steines DANIEL

## 2019-11-12 NOTE — Progress Notes (Signed)
Patient was transferred from PACU to [place] at 2312. Pt A&Ox4 and afebrile. BP WNL, HR 120's, SPO2 97%. CCMD called with second person to verify tachycardia on the monitor. CHG bath given. Left Groin no hematoma and left leg surgical skin site has skin glue, clean dry and intact. Detected dorsalis pedal pulse, anterior pedal pulse with doppler. Patient given call bell. Will continue to monitor.

## 2019-11-12 NOTE — Progress Notes (Signed)
  Progress Note    11/12/2019 4:16 PM Day of Surgery  Subjective:  No major complaints   Vitals:   11/12/19 1445 11/12/19 1500  BP:  (!) 130/59  Pulse: 90 91  Resp: (!) 22 12  Temp:  98.6 F (37 C)  SpO2: 98% 97%   Physical Exam: Cardiac: regular Lungs:  Non labored Incisions:  Let leg incisions clean, dry and intact without swelling or hematoma Extremities:  Bilateral lower extremities well perfused and warm. Doppler DP, PT signals left foot Neurologic: alert and oriented  CBC    Component Value Date/Time   WBC 8.6 11/08/2019 1144   RBC 4.20 11/08/2019 1144   HGB 13.3 11/08/2019 1144   HGB 14.4 09/10/2019 1144   HCT 40.6 11/08/2019 1144   HCT 41.8 09/10/2019 1144   PLT 299 11/08/2019 1144   PLT 247 09/10/2019 1144   MCV 96.7 11/08/2019 1144   MCV 94 09/10/2019 1144   MCH 31.7 11/08/2019 1144   MCHC 32.8 11/08/2019 1144   RDW 11.9 11/08/2019 1144   RDW 11.8 09/10/2019 1144   LYMPHSABS 1.9 08/16/2019 0753   LYMPHSABS 2.3 12/26/2018 0941   MONOABS 0.9 08/16/2019 0753   EOSABS 0.2 08/16/2019 0753   EOSABS 0.2 12/26/2018 0941   BASOSABS 0.0 08/16/2019 0753   BASOSABS 0.0 12/26/2018 0941    BMET    Component Value Date/Time   NA 135 11/08/2019 1144   NA 139 09/10/2019 1144   K 4.1 11/08/2019 1144   CL 98 11/08/2019 1144   CO2 26 11/08/2019 1144   GLUCOSE 401 (H) 11/08/2019 1144   BUN 13 11/08/2019 1144   BUN 10 09/10/2019 1144   CREATININE 1.33 (H) 11/08/2019 1144   CREATININE 1.07 (H) 04/26/2016 1412   CALCIUM 9.2 11/08/2019 1144   GFRNONAA 44 (L) 11/08/2019 1144   GFRNONAA 59 (L) 04/26/2016 1412   GFRAA 51 (L) 11/08/2019 1144   GFRAA 68 04/26/2016 1412    INR    Component Value Date/Time   INR 1.0 11/08/2019 1144     Intake/Output Summary (Last 24 hours) at 11/12/2019 1616 Last data filed at 11/12/2019 1105 Gross per 24 hour  Intake 1650 ml  Output 425 ml  Net 1225 ml     Assessment/Plan:  59 y.o. female is s/p Harvest of left greater  saphenous vein, left common femoral, SFA, profundofemoral endarterectomy, left common femoral to above knee popliteal artery bypass with ipsilateral, translocated, non reversed greater saphenous vein Day of Surgery. Doing well post op. Pain well controlled. Left lower extremity well perfused and warm. Doppler DP and PT signals. Left foot warm. Incisions c/d/i.  To the floor later this evening   Karoline Caldwell, [place] [facility] [phone] 11/12/2019 4:16 PM

## 2019-11-12 NOTE — Interval H&P Note (Signed)
History and Physical Interval Note:  11/12/2019 7:10 AM  Julie Prince  has presented today for surgery, with the diagnosis of PERIPHERAL ARTERY DISEASE.  The various methods of treatment have been discussed with the patient and family. After consideration of risks, benefits and other options for treatment, the patient has consented to  Procedure(s): BYPASS GRAFT FEMORAL-POPLITEAL ARTERY (Left) as a surgical intervention.  The patient's history has been reviewed, patient examined, no change in status, stable for surgery.  I have reviewed the patient's chart and labs.  Questions were answered to the patient's satisfaction.     Servando Snare

## 2019-11-12 NOTE — Progress Notes (Signed)
Pt's BP 83/58,HR 123 at 2123. 500 cc bolus NS given per order for SBP <100.

## 2019-11-12 NOTE — Progress Notes (Signed)
Admission meds for patient requested from pharmacy

## 2019-11-12 NOTE — Anesthesia Procedure Notes (Signed)
Procedure Name: Intubation Date/Time: 11/12/2019 7:44 AM Performed by: Jenne Campus, CRNA Pre-anesthesia Checklist: Patient identified, Emergency Drugs available, Suction available and Patient being monitored Patient Re-evaluated:Patient Re-evaluated prior to induction Oxygen Delivery Method: Circle System Utilized Preoxygenation: Pre-oxygenation with 100% oxygen Induction Type: IV induction Ventilation: Mask ventilation without difficulty Laryngoscope Size: Miller and 2 Grade View: Grade I Tube type: Oral Tube size: 7.5 mm Number of attempts: 1 Airway Equipment and Method: Stylet and Oral airway Placement Confirmation: ETT inserted through vocal cords under direct vision,  positive ETCO2 and breath sounds checked- equal and bilateral Secured at: 21 cm Tube secured with: Tape Dental Injury: Teeth and Oropharynx as per pre-operative assessment

## 2019-11-12 NOTE — Progress Notes (Addendum)
Inpatient Diabetes Program Recommendations  AACE/ADA: New Consensus Statement on Inpatient Glycemic Control (2015)  Target Ranges:  Prepandial:   less than 140 mg/dL      Peak postprandial:   less than 180 mg/dL (1-2 hours)      Critically ill patients:  140 - 180 mg/dL   Lab Results  Component Value Date   GLUCAP 171 (H) 11/12/2019   HGBA1C 13.5 (H) 11/08/2019    Review of Glycemic Control Results for JERALDEAN, WECHTER (MRN [id number]) as of 11/12/2019 13:26  Ref. Range 11/08/2019 10:59 11/12/2019 05:39 11/12/2019 11:16  Glucose-Capillary Latest Ref Range: 70 - 99 mg/dL 356 (H) 184 (H) 171 (H)   Diabetes history: DM 2 Outpatient Diabetes medications:  Ozempic 0.25-0.5 mg weekly, Tresiba 50 units q HS  Current orders for Inpatient glycemic control:  Novolog moderate tid with meals  Tresiba 50 units q HS Inpatient Diabetes Program Recommendations:    Note that Tyler Aas not on formulary- May need to switch to Lantus 50 units q HS while in the hospital.  Also note elevated A1C.  Will have diabetes coordinator see patient on 11/13/19 to discuss outpatient glycemic control.   Thanks  Adah Perl, RN, BC-ADM Inpatient Diabetes Coordinator Pager [phone] (8a-5p)

## 2019-11-12 NOTE — Transfer of Care (Signed)
Immediate Anesthesia Transfer of Care Note  Patient: [facility]  Procedure(s) Performed: BYPASS GRAFT FEMORAL-ABOVE KNEE POPLITEAL ARTERY (Left )  Patient Location: PACU  Anesthesia Type:General  Level of Consciousness: oriented, drowsy and patient cooperative  Airway & Oxygen Therapy: Patient Spontanous Breathing and Patient connected to nasal cannula oxygen  Post-op Assessment: Report given to RN and Post -op Vital signs reviewed and stable  Post vital signs: Reviewed  Last Vitals:  Vitals Value Taken Time  BP 148/78 11/12/19 1110  Temp    Pulse 82 11/12/19 1113  Resp 15 11/12/19 1113  SpO2 100 % 11/12/19 1113  Vitals shown include unvalidated device data.  Last Pain:  Vitals:   11/12/19 0636  TempSrc:   PainSc: 0-No pain      Patients Stated Pain Goal: 4 (38/18/40 3754)  Complications: No complications documented.

## 2019-11-12 NOTE — Progress Notes (Signed)
Pt's BP 111/58,HR 119 after 500 cc bolus. Pt A&Ox4. Scheduled 50mg  PO Metoprolol administered.

## 2019-11-12 NOTE — Op Note (Signed)
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 Donzetta Matters, MD Assistant: Paul Half, PA Procedure Performed: 1.  Harvest left greater saphenous vein 2.  Left common femoral, SFA, profundofemoral endarterectomy 3.  Left common femoral to above-knee popliteal artery bypass with ipsilateral, translocated, nonreversed greater saphenous vein  Indications: 59 year old female with left small toe gangrenous changes was found to have occluded SFA by angiogram was indicated for the above-noted procedure  An assistant was necessary for suction, retraction, vein preparation and assistance with anastomosis and wound closure.  Findings: Common femoral artery was heavily calcified.  [name].  We extended endarterectomy down to the SFA did not have any significant backbleeding from there but did have brisk backbleeding from the profunda.  Greater saphenous vein in the groin was approximately 4 mm and did branch in the mid thigh or distal to this it was much smaller probably only approximately 3 mm.  Below the knee was quite thin-walled was injured there.  Only appeared suitable in the above-the-knee segment.  The above-the-knee popliteal artery was calcified but we perform limited endarterectomy and then femoropopliteal bypass.  At completion we did have a palpable anterior tibial pulse at the ankle.  Signal in the popliteal artery distal was dependent on the graft.   Procedure:  The patient was identified in the holding area and taken to the operating room where she is placed upon operative general anesthesia was induced.  She was sterilely prepped and draped in the left lower extremity usual fashion antibiotics were administered and a timeout was called.  We began using ultrasound to identify the common femoral artery  bifurcation just above the groin crease.  We also identified the saphenous vein marked this on the skin all the way to the below-knee area.  We then began with transverse incision in the groin.  We dissected down through the skin subcutaneous tissue to identify the heavily calcified common femoral artery.  We dissected up onto the inguinal ligament placed Vesseloops around the external iliac artery where it was soft and around the profunda.  The entire common femoral artery was heavily calcified as was the SFA which was noted to be occluded.  Through the same incision we identified the greater saphenous vein.  There are multiple branches in the groin that were divided between ties.  We then made 2 skip incisions above the knee dissecting out the vein dividing branches between clips and ties.  There was a large branch in the mid thigh each with equivalent size of.  We had previously traced the more medial branch to the below-knee space.  We traced this branch down divided the other large branch.  Unfortunately in this area the vein did taper from where it was previously 4 mm to likely less than 3 mm.  We extended with another incision below the knee.  Below the knee it was quite varicose we did injure it 1 area.   We then tied it off distally.  We brought her back through all of our incisions after all branches have been divided we placed a side-biting clamp on the saphenofemoral junction and divided the vein.  We then oversewed the saphenofemoral junction with 5-0 Prolene suture in a running mattress fashion.  This was then passed off the table and prepared by the PA.  I then turned my attention to dissecting out  the above-knee popliteal artery.  I dissected down this was somewhat calcified although with Doppler there was a good monophasic signal and there were 2 areas soft enough for clamping.  When the vein was prepared we then placed the tunneler between the above the knee and groin incisions and a subfascial  plane.  Patient was fully heparinized.  I then clamped the profunda followed by the external leg artery.  I opened the vessel longitudinally performed endarterectomy of the entire common femoral artery extending up under the inguinal ligament to the external iliac artery down to the SFA and eversion of the profunda.  Very strong backbleeding from the profunda.  We had very strong inflow from the external leg artery.  We then prepared our vein opening up through the first valve and spatulated.  We then sewed end-to-side with 5-0 Prolene suture.  Upon completion we released our clamps.  We then had flow into the initial stages of the vein.  I used a valvulotome lysed all the valves and had pulsatile flow.  It was clamped distally more for orientation and tunneled in a subsartorial plane.  We then clamped the above-knee popliteal artery distally and proximally.  We opened it longitudinally.  I did have to perform limited endarterectomy.  I think was very good backbleeding.  There is minimal antegrade bleeding it was reclamped flushed with heparinized saline.  The leg was straightened the vein was marked and trimmed to size.  The vein was then spatulated sewn into side with 6-0 Prolene suture.  Prior completion without flushing all directions.  Upon completion there was very good pulsatility distally in the wound.  There was a good Doppler signal that was graft dependent.  There was also a good anterior tibial artery signal at the ankle I was graft dependent.  We were ultimately able to palpate the anterior tibial artery at the ankle.  We administered 50 mg of protamine.  We irrigated all of our wounds obtain hemostasis and closed in layers with Vicryl and Monocryl.  Dermabond was placed to the level of the skin.  She was awakened from anesthesia having tolerated procedure without any complication.  All counts were correct at completion.  EBL: 300 cc    Lovene Maret C. Donzetta Matters, MD [facility]  [place] Office: [phone] Pager: [phone]

## 2019-11-13 ENCOUNTER — Ambulatory Visit: Payer: 59 | Admitting: Internal Medicine

## 2019-11-13 ENCOUNTER — Telehealth: Payer: Self-pay | Admitting: Physician Assistant

## 2019-11-13 ENCOUNTER — Encounter (HOSPITAL_COMMUNITY): Payer: Self-pay | Admitting: Vascular Surgery

## 2019-11-13 LAB — CBC
HCT: 28.7 % — ABNORMAL LOW (ref 36.0–46.0)
Hemoglobin: 9.3 g/dL — ABNORMAL LOW (ref 12.0–15.0)
MCH: 30.8 pg (ref 26.0–34.0)
MCHC: 32.4 g/dL (ref 30.0–36.0)
MCV: 95 fL (ref 80.0–100.0)
Platelets: 266 10*3/uL (ref 150–400)
RBC: 3.02 MIL/uL — ABNORMAL LOW (ref 3.87–5.11)
RDW: 11.9 % (ref 11.5–15.5)
WBC: 9.4 10*3/uL (ref 4.0–10.5)
nRBC: 0 % (ref 0.0–0.2)

## 2019-11-13 LAB — BASIC METABOLIC PANEL
Anion gap: 7 (ref 5–15)
BUN: 14 mg/dL (ref 6–20)
CO2: 25 mmol/L (ref 22–32)
Calcium: 8 mg/dL — ABNORMAL LOW (ref 8.9–10.3)
Chloride: 100 mmol/L (ref 98–111)
Creatinine, Ser: 1.01 mg/dL — ABNORMAL HIGH (ref 0.44–1.00)
GFR calc Af Amer: >60 mL/min (ref >60)
GFR calc non Af Amer: >60 mL/min (ref >60)
Glucose, Bld: 214 mg/dL — ABNORMAL HIGH (ref 70–99)
Potassium: 4.4 mmol/L (ref 3.5–5.1)
Sodium: 132 mmol/L — ABNORMAL LOW (ref 135–145)

## 2019-11-13 LAB — GLUCOSE, CAPILLARY
Glucose-Capillary: 148 mg/dL — ABNORMAL HIGH (ref 70–99)
Glucose-Capillary: 117 mg/dL — ABNORMAL HIGH (ref 70–99)
Glucose-Capillary: 119 mg/dL — ABNORMAL HIGH (ref 70–99)
Glucose-Capillary: 100 mg/dL — ABNORMAL HIGH (ref 70–99)

## 2019-11-13 LAB — LIPID PANEL
Cholesterol: 104 mg/dL (ref 0–200)
HDL: 26 mg/dL — ABNORMAL LOW (ref >40)
LDL Cholesterol: 60 mg/dL (ref 0–99)
Total CHOL/HDL Ratio: 4 RATIO
Triglycerides: 88 mg/dL (ref <150)
VLDL: 18 mg/dL (ref 0–40)

## 2019-11-13 MED ORDER — TRESIBA FLEXTOUCH 100 UNIT/ML ~~LOC~~ SOPN: PEN_INJECTOR | SUBCUTANEOUS | 1 refills | Status: DC

## 2019-11-13 NOTE — Progress Notes (Signed)
PHARMACIST LIPID MONITORING   Julie Prince is a 59 y.o. female admitted on 11/12/2019 s/p vascular surgery.  Pharmacy has been consulted to optimize lipid-lowering therapy with the indication of secondary prevention for clinical ASCVD.  Recent Labs:  Lipid Panel (last 6 months):   Lab Results  Component Value Date   CHOL 104 11/13/2019   TRIG 88 11/13/2019   HDL 26 (L) 11/13/2019   CHOLHDL 4.0 11/13/2019   VLDL 18 11/13/2019   LDLCALC 60 11/13/2019    Hepatic function panel (last 6 months):   Lab Results  Component Value Date   AST 12 (L) 11/08/2019   ALT 10 11/08/2019   ALKPHOS 89 11/08/2019   BILITOT 0.5 11/08/2019    SCr (since admission):   Serum creatinine: 1.01 mg/dL (H) 11/13/19 0138 Estimated creatinine clearance: 68.3 mL/min (A)  Current lipid-lowering therapy: Lipitor 40 mg  Follow-up labs after discharge:    Liver function panel and lipid panel in 8-12 weeks then annually  Plan: No changes in lipid lowering therapy  Tad Moore, PharmD 11/13/2019, 7:56 AM

## 2019-11-13 NOTE — Discharge Instructions (Signed)
 [facility] [place]  Discharge instructions  Lower Extremity Bypass Surgery  Please refer to the following instruction for your post-procedure care. Your surgeon or physician assistant will discuss any changes with you.  Activity  You are encouraged to walk as much as you can. You can slowly return to normal activities during the month after your surgery. Avoid strenuous activity and heavy lifting until your doctor tells you it's OK. Avoid activities such as vacuuming or swinging a golf club. Do not drive until your doctor give the OK and you are no longer taking prescription pain medications. It is also normal to have difficulty with sleep habits, eating and bowel movement after surgery. These will go away with time.  Bathing/Showering  Shower daily after you go home. Do not soak in a bathtub, hot tub, or swim until the incision heals completely.  Incision Care  Clean your incision with mild soap and water. Shower every day. Pat the area dry with a clean towel. You do not need a bandage unless otherwise instructed. Do not apply any ointments or creams to your incision. If you have open wounds you will be instructed how to care for them or a visiting nurse may be arranged for you. If you have staples or sutures along your incision they will be removed at your post-op appointment. You may have skin glue on your incision. Do not peel it off. It will come off on its own in about one week.  Wash the groin wound with soap and water daily and pat dry. (No tub bath-only shower)  Then put a dry gauze or washcloth in the groin to keep this area dry to help prevent wound infection.  Do this daily and as needed.  Do not use Vaseline or neosporin on your incisions.  Only use soap and water on your incisions and then protect and keep dry.  Diet  Resume your normal diet. There are no special food restrictions following this procedure. A low fat/ low cholesterol diet is  recommended for all patients with vascular disease. In order to heal from your surgery, it is CRITICAL to get adequate nutrition. Your body requires vitamins, minerals, and protein. Vegetables are the best source of vitamins and minerals. Vegetables also provide the perfect balance of protein. Processed food has little nutritional value, so try to avoid this.  Medications  Resume taking all your medications unless your doctor or physician assistant tells you not to. If your incision is causing pain, you may take over-the-counter pain relievers such as acetaminophen (Tylenol). If you were prescribed a stronger pain medication, please aware these medication can cause nausea and constipation. Prevent nausea by taking the medication with a snack or meal. Avoid constipation by drinking plenty of fluids and eating foods with high amount of fiber, such as fruits, vegetables, and grains. Take Colace 100 mg (an over-the-counter stool softener) twice a day as needed for constipation.  Do not take Tylenol if you are taking prescription pain medications.  Follow Up  Our office will schedule a follow up appointment 2-3 weeks following discharge.  Please call us immediately for any of the following conditions  Severe or worsening pain in your legs or feet while at rest or while walking Increase pain, redness, warmth, or drainage (pus) from your incision site(s) Fever of 101 degree or higher The swelling in your leg with the bypass suddenly worsens and becomes more painful than when you were in the hospital If you have   been instructed to feel your graft pulse then you should do so every day. If you can no longer feel this pulse, call the office immediately. Not all patients are given this instruction.  Leg swelling is common after leg bypass surgery.  The swelling should improve over a few months following surgery. To improve the swelling, you may elevate your legs above the level of your heart while you are  sitting or resting. Your surgeon or physician assistant may ask you to apply an ACE wrap or wear compression (TED) stockings to help to reduce swelling.  Reduce your risk of vascular disease  Stop smoking. If you would like help call [facility] at 1-800-QUIT-NOW (1-800-784-8669) or [place] at 336-586-4000.  Manage your cholesterol Maintain a desired weight Control your diabetes weight Control your diabetes Keep your blood pressure down  If you have any questions, please call the office at [phone]  

## 2019-11-13 NOTE — Telephone Encounter (Signed)
Patient's [facility] pharm called asking for a new prescription for patient's Tresiba. The [facility] [place] has been trying to get this order transferred but cannot get [facility] to cooperate. They are asking for a new order to be sent to them if possible.

## 2019-11-13 NOTE — Evaluation (Signed)
Physical Therapy Evaluation Patient Details Name: Julie Prince MRN: [id number] DOB: 1960/08/01 Today's Date: 11/13/2019   History of Present Illness  Pt is a 59 y.o. female now s/p Harvest of left greater saphenous vein; left common femoral, SFA, profundofemoral endarterectomy; left common femoral to above knee popliteal artery bypass on 8/24. PMHx includes HTN, PVD, DM, hx abdominal aortogram (most recent 08/2019).   Clinical Impression  Pt was seen for mobility on RW with minimal distance due to BP.  Supine value was 127/63, sitting 112/64, standing 88/60 and post gait on side of bed was 90/55.  Talked with patient about monitoring closely to make sure she was not going to get light headed and unsteady with walks away from side of bed.  Follow acutely for her goals of therapy and focus on monitoring orthostatics.    Follow Up Recommendations Home health PT;Supervision for mobility/OOB    Equipment Recommendations  Rolling walker with 5" wheels    Recommendations for Other Services       Precautions / Restrictions Precautions Precautions: Fall Precaution Comments: watch BP Restrictions Weight Bearing Restrictions: No LLE Weight Bearing: Weight bearing as tolerated      Mobility  Bed Mobility Overal bed mobility: Needs Assistance Bed Mobility: Supine to Sit     Supine to sit: Min assist Sit to supine: Min assist   General bed mobility comments: assist to move to sit up on trunk and LLE  Transfers Overall transfer level: Needs assistance Equipment used: Rolling walker (2 wheeled) Transfers: Sit to/from Stand Sit to Stand: Min assist         General transfer comment: remidners for sequence  Ambulation/Gait Ambulation/Gait assistance: Min guard;Min assist Gait Distance (Feet): 8 Feet Assistive device: Rolling walker (2 wheeled);1 person hand held assist Gait Pattern/deviations: Decreased stride length;Step-to pattern;Wide base of support Gait velocity:  reduced Gait velocity interpretation: <1.31 ft/sec, indicative of household ambulator General Gait Details: sidesteps side of bed due to BP's  Stairs            Wheelchair Mobility    Modified Rankin (Stroke Patients Only)       Balance Overall balance assessment: Needs assistance Sitting-balance support: Feet supported Sitting balance-Leahy Scale: Good     Standing balance support: Bilateral upper extremity supported Standing balance-Leahy Scale: Fair Standing balance comment: less than fair dynamically                             Pertinent Vitals/Pain Pain Assessment: Faces Faces Pain Scale: Hurts little more Pain Location: L leg incision Pain Descriptors / Indicators: Operative site guarding;Guarding;Grimacing Pain Intervention(s): Limited activity within patient's tolerance;Monitored during session;Premedicated before session;Repositioned    Home Living Family/patient expects to be discharged to:: Private residence Living Arrangements: Children Available Help at Discharge: Family;Available 24 hours/day Type of Home: House Home Access: Stairs to enter   [employer] of Steps: 1 Home Layout: One level Home Equipment: None      Prior Function Level of Independence: Independent               Hand Dominance        Extremity/Trunk Assessment   Upper Extremity Assessment Upper Extremity Assessment: Overall WFL for tasks assessed    Lower Extremity Assessment Lower Extremity Assessment: LLE deficits/detail LLE Deficits / Details: weakness with some joint stiffnes LLE Coordination: decreased gross motor    Cervical / Trunk Assessment Cervical / Trunk Assessment: Normal  Communication   Communication:  No difficulties  Cognition Arousal/Alertness: Awake/alert Behavior During Therapy: WFL for tasks assessed/performed Overall Cognitive Status: Within Functional Limits for tasks assessed                                         General Comments General comments (skin integrity, edema, etc.): pt is improved from first therapy with OT and motivated, but her BP is dropping with mobility    Exercises     Assessment/Plan    PT Assessment Patient needs continued PT services  PT Problem List Decreased strength;Decreased balance;Decreased range of motion;Decreased coordination;Cardiopulmonary status limiting activity;Decreased skin integrity;Pain       PT Treatment Interventions DME instruction;Gait training;Stair training;Functional mobility training;Therapeutic activities;Therapeutic exercise;Balance training;Neuromuscular re-education;Patient/family education    PT Goals (Current goals can be found in the Care Plan section)  Acute Rehab PT Goals Patient Stated Goal: to go home with her daughter PT Goal Formulation: With patient Time For Goal Achievement: 11/27/19 Potential to Achieve Goals: Good    Frequency Min 3X/week   Barriers to discharge Inaccessible home environment has one step to enter house    Co-evaluation               AM-PAC PT "6 Clicks" Mobility  Outcome Measure Help needed turning from your back to your side while in a flat bed without using bedrails?: A Little Help needed moving from lying on your back to sitting on the side of a flat bed without using bedrails?: A Little Help needed moving to and from a bed to a chair (including a wheelchair)?: A Little Help needed standing up from a chair using your arms (e.g., wheelchair or bedside chair)?: A Little Help needed to walk in hospital room?: A Little Help needed climbing 3-5 steps with a railing? : A Lot 6 Click Score: 17    End of Session Equipment Utilized During Treatment: Gait belt Activity Tolerance: Patient tolerated treatment well;Treatment limited secondary to medical complications (Comment) Patient left: in bed;with call bell/phone within reach;with bed alarm set Nurse Communication: Mobility  status PT Visit Diagnosis: Unsteadiness on feet (R26.81);Muscle weakness (generalized) (M62.81);Pain Pain - Right/Left: Left Pain - part of body: Knee;Leg;Ankle and joints of foot    Time: 7711-6579 PT Time Calculation (min) (ACUTE ONLY): 34 min   Charges:   PT Evaluation $PT Eval Moderate Complexity: 1 Mod PT Treatments $Gait Training: 8-22 mins       Ramond Dial 11/13/2019, 4:52 PM  Mee Hives, PT MS Acute Rehab Dept. Number: [place] and [place]

## 2019-11-13 NOTE — Progress Notes (Addendum)
  Progress Note    11/13/2019 7:18 AM 1 Day Post-Op  Subjective:  Smiling this morning and says she feels pretty good  Afebrile HR 80's-110's NSR/ST 13'Y-865'H systolic 84% RA  Vitals:   11/13/19 0416 11/13/19 0439  BP: 111/76   Pulse: (!) 113 85  Resp: 20 16  Temp: 98 F (36.7 C)   SpO2: 99% 99%    Physical Exam: Cardiac:  regular Lungs:  Non labored Incisions:  All incisions look fine Extremities:  +doppler signal left DP/PT   CBC    Component Value Date/Time   WBC 9.4 11/13/2019 0138   RBC 3.02 (L) 11/13/2019 0138   HGB 9.3 (L) 11/13/2019 0138   HGB 14.4 09/10/2019 1144   HCT 28.7 (L) 11/13/2019 0138   HCT 41.8 09/10/2019 1144   PLT 266 11/13/2019 0138   PLT 247 09/10/2019 1144   MCV 95.0 11/13/2019 0138   MCV 94 09/10/2019 1144   MCH 30.8 11/13/2019 0138   MCHC 32.4 11/13/2019 0138   RDW 11.9 11/13/2019 0138   RDW 11.8 09/10/2019 1144   LYMPHSABS 1.9 08/16/2019 0753   LYMPHSABS 2.3 12/26/2018 0941   MONOABS 0.9 08/16/2019 0753   EOSABS 0.2 08/16/2019 0753   EOSABS 0.2 12/26/2018 0941   BASOSABS 0.0 08/16/2019 0753   BASOSABS 0.0 12/26/2018 0941    BMET    Component Value Date/Time   NA 132 (L) 11/13/2019 0138   NA 139 09/10/2019 1144   K 4.4 11/13/2019 0138   CL 100 11/13/2019 0138   CO2 25 11/13/2019 0138   GLUCOSE 214 (H) 11/13/2019 0138   BUN 14 11/13/2019 0138   BUN 10 09/10/2019 1144   CREATININE 1.01 (H) 11/13/2019 0138   CREATININE 1.07 (H) 04/26/2016 1412   CALCIUM 8.0 (L) 11/13/2019 0138   GFRNONAA >60 11/13/2019 0138   GFRNONAA 59 (L) 04/26/2016 1412   GFRAA >60 11/13/2019 0138   GFRAA 68 04/26/2016 1412    INR    Component Value Date/Time   INR 1.0 11/08/2019 1144     Intake/Output Summary (Last 24 hours) at 11/13/2019 0718 Last data filed at 11/13/2019 0600 Gross per 24 hour  Intake 2333.37 ml  Output 1125 ml  Net 1208.37 ml     Assessment:  59 y.o. female is s/p:  1.  Harvest left greater saphenous vein 2.   Left common femoral, SFA, profundofemoral endarterectomy 3.  Left common femoral to above-knee popliteal artery bypass with ipsilateral, translocated, nonreversed greater saphenous vein  1 Day Post-Op  Plan: -pt doing well this morning with +doppler signals left DP/PT -mild hypotension yesterday-required fluid bolus.  BP improved this am -all incisions look fine-discussed groin wound care with pt and she expressed understanding -left 5th toe with wound-will need to demarcate -will start mobilizing pt today -DVT prophylaxis:  Sq heparin   Leontine Locket, PA-C [facility] [phone] 11/13/2019 7:18 AM  I have independently interviewed and examined patient and agree with PA assessment and plan above.   Kodi Steil C. Donzetta Matters, MD [facility] [place] Office: [phone] Pager: [phone]

## 2019-11-13 NOTE — Addendum Note (Signed)
Addended by: Mickel Crow on: 11/13/2019 03:08 PM   Modules accepted: Orders

## 2019-11-13 NOTE — Telephone Encounter (Signed)
Refill sent to requested pharmacy. AS, CMA 

## 2019-11-13 NOTE — Progress Notes (Signed)
Mobility Specialist: Progress Note    11/13/19 1429  Mobility  Activity Ambulated in room  Level of Assistance Minimal assist, patient does 75% or more  Assistive Device Front wheel walker  Distance Ambulated (ft) 25 ft  Mobility Response Tolerated fair  Mobility performed by Mobility specialist  Bed Position High-fowlers  $Mobility charge 1 Mobility   Pre-Mobility:   Lying: 100 HR, 100/65 BP, 98% SpO2  Sitting: 109/81   Standing: 89/56  Standing: 89/58 Post-Mobility:   Sitting: 91 HR, 131/60 BP, 98% SpO2  Lying: 135/60 BP  Pt had no c/o of feeling light headed or dizzy upon sitting on EOB and standing. Pt's BP dropped upon standing (see above) but said she didn't feel dizzy or light headed and felt like she could walk. Pt walked out to the hallway and went back to room. After returning to bed, pt c/o feeling "nauseous and a little whoozy," RN notified.   [facility] Julie Prince Mobility Specialist

## 2019-11-13 NOTE — Evaluation (Signed)
Occupational Therapy Evaluation Patient Details Name: Julie Prince MRN: [id number] DOB: 1960-11-22 Today's Date: 11/13/2019    History of Present Illness Pt is a 59 y.o. female now s/p Harvest of left greater saphenous vein; left common femoral, SFA, profundofemoral endarterectomy; left common femoral to above knee popliteal artery bypass on 8/24. PMHx includes HTN, PVD, DM, hx abdominal aortogram (most recent 08/2019).    Clinical Impression   This 59 y/o female presents with the above. PTA pt reports being independent with ADL and functional mobility, living with her daughter. Pt very pleasant and willing to pariticpate in therapy session. Session limited due to decreased BP in sitting and again in standing (see below). Overall pt requiring minA for bed mobility, modA for sit<>stand and minguard assist for static standing balance EOB using RW. She requires up to [facility] for LB ADL. Anticipate pt to progress well as BP improves and pt able to mobilize further (she is eager to do so). She reports plans to return home with daughter's assist with ADL/iADL PRN. Pt to benefit from continued acute OT services and currently require follow up [facility] services after discharge.   BP start of session: 109/70s sitting EOB: 73/49 (59) - pt asymptomatic Sitting EOB x4 min: 109/64 (77) Standing: 79/61 (68) - mild dizziness Standing x2 min: 75/45 (55)  Return to sitting: 79/36 (51)  After return to supine: 111/62 (78), 117/60 (77) end of session     Follow Up Recommendations  Home health OT;Supervision/Assistance - 24 hour    Equipment Recommendations  Tub/shower seat           Precautions / Restrictions Precautions Precautions: Fall Precaution Comments: watch BP Restrictions Weight Bearing Restrictions: No LLE Weight Bearing: Weight bearing as tolerated      Mobility Bed Mobility Overal bed mobility: Needs Assistance Bed Mobility: Supine to Sit;Sit to Supine     Supine to sit: Min  assist Sit to supine: Min assist   General bed mobility comments: assist for trunk elevation, increased time/effort for moving LEs to/from EOB  Transfers Overall transfer level: Needs assistance Equipment used: Rolling walker (2 wheeled) Transfers: Sit to/from Stand Sit to Stand: Mod assist         General transfer comment: VCs for safe hand placement, boosting assist to rise from EOB; pt also performing lateral scoot towards HOB with minA     Balance Overall balance assessment: Needs assistance Sitting-balance support: Feet supported Sitting balance-Leahy Scale: Good     Standing balance support: Bilateral upper extremity supported Standing balance-Leahy Scale: Poor                             ADL either performed or assessed with clinical judgement   ADL Overall ADL's : Needs assistance/impaired Eating/Feeding: Modified independent;Sitting Eating/Feeding Details (indicate cue type and reason): finishing breakfast upon arrival  Grooming: Wash/dry face;Supervision/safety;Sitting Grooming Details (indicate cue type and reason): seated EOB Upper Body Bathing: Min guard;Sitting   Lower Body Bathing: Moderate assistance;Sit to/from stand   Upper Body Dressing : Set up;Sitting   Lower Body Dressing: Moderate assistance;Sit to/from stand               Functional mobility during ADLs: Moderate assistance;Rolling walker (sit<>stand) General ADL Comments: pt limited today given hypotension with sitting/standing      Vision         Perception     Praxis      Pertinent Vitals/Pain Pain Assessment: Faces Faces  Pain Scale: Hurts a little bit Pain Location: mild discomfort at incision site  Pain Descriptors / Indicators: Discomfort;Grimacing;Sore Pain Intervention(s): Limited activity within patient's tolerance;Monitored during session;Repositioned     Hand Dominance     Extremity/Trunk Assessment Upper Extremity Assessment Upper Extremity  Assessment: Overall WFL for tasks assessed   Lower Extremity Assessment Lower Extremity Assessment: Defer to PT evaluation       Communication Communication Communication: No difficulties   Cognition Arousal/Alertness: Awake/alert Behavior During Therapy: WFL for tasks assessed/performed Overall Cognitive Status: Within Functional Limits for tasks assessed                                     General Comments       Exercises     Shoulder Instructions      Home Living Family/patient expects to be discharged to:: Private residence Living Arrangements: Children (daughter taking 2 weeks off) Available Help at Discharge: Family;Available 24 hours/day Type of Home: House Home Access: Stairs to enter [employer] of Steps: 1   Home Layout: One level     Bathroom Shower/Tub: Teacher, early years/pre: Handicapped height     Home Equipment: None          Prior Functioning/Environment Level of Independence: Independent                 OT Problem List: Decreased strength;Decreased range of motion;Decreased activity tolerance;Decreased knowledge of use of DME or AE;Decreased knowledge of precautions;Pain;Cardiopulmonary status limiting activity;Impaired balance (sitting and/or standing)      OT Treatment/Interventions: Self-care/ADL training;Therapeutic exercise;Energy conservation;DME and/or AE instruction;Therapeutic activities;Patient/family education;Balance training    OT Goals(Current goals can be found in the care plan section) Acute Rehab OT Goals Patient Stated Goal: home when able, to her grandkids OT Goal Formulation: With patient Time For Goal Achievement: 11/27/19 Potential to Achieve Goals: Good  OT Frequency: Min 2X/week   Barriers to D/C:            Co-evaluation              AM-PAC OT "6 Clicks" Daily Activity     Outcome Measure Help from another person eating meals?: None Help from another person  taking care of personal grooming?: A Little Help from another person toileting, which includes using toliet, bedpan, or urinal?: A Lot Help from another person bathing (including washing, rinsing, drying)?: A Lot Help from another person to put on and taking off regular upper body clothing?: A Little Help from another person to put on and taking off regular lower body clothing?: A Lot 6 Click Score: 16   End of Session Equipment Utilized During Treatment: Gait belt;Rolling walker Nurse Communication: Mobility status;Other (comment) (drop in BP)  Activity Tolerance: Patient tolerated treatment well;Treatment limited secondary to medical complications (Comment) (limited due to hypotension) Patient left: in bed;with call bell/phone within reach  OT Visit Diagnosis: Other abnormalities of gait and mobility (R26.89);Pain Pain - Right/Left: Left Pain - part of body: Leg                Time: 0832-0907 OT Time Calculation (min): 35 min Charges:  OT General Charges $OT Visit: 1 Visit OT Evaluation $OT Eval Moderate Complexity: 1 Mod OT Treatments $Self Care/Home Management : 8-22 mins  Lou Cal, OT Acute Rehabilitation Services Pager [phone] Office [phone]  Raymondo Band 11/13/2019, 11:10 AM

## 2019-11-13 NOTE — Progress Notes (Signed)
Inpatient Diabetes Program Recommendations  AACE/ADA: New Consensus Statement on Inpatient Glycemic Control (2015)  Target Ranges:  Prepandial:   less than 140 mg/dL      Peak postprandial:   less than 180 mg/dL (1-2 hours)      Critically ill patients:  140 - 180 mg/dL   Lab Results  Component Value Date   GLUCAP 117 (H) 11/13/2019   HGBA1C 13.5 (H) 11/08/2019    Diabetes history:  DM2  Outpatient Diabetes medications:  Ozempic Tresbia 50 units daily  Current orders for Inpatient glycemic control:  Lantus 50 units daily  Novolog 0-15 units tid  Note:  Diabetes consult for poorly controlled diabetes.  Spoke with patient at bedside.  Reviewed patient's current A1c of 13.5%. Explained what a A1c is and what it measures. Also reviewed goal A1c with patient, importance of good glucose control @ home, and blood sugar goals.  She states she has been without her diabetes medications for almost 1 year as she did not have insurance.  She states she has her medications at home now as she recently became insured.  She states she can now afford her medications.  When she administers her [facility] she rotates sites appropriately.  She also has the [facility] and is aware to check her blood sugar 3-4 times daily.    She does not drink any beverages with sugar.  She does watch her CHO intake.  She is aware of hypoglycemia, symptoms and treatments.    Will continue to follow while inpatient.  Thank you, Reche Dixon, RN, BSN Diabetes Coordinator Inpatient Diabetes Program [phone] (team pager from 8a-5p)

## 2019-11-14 LAB — GLUCOSE, CAPILLARY
Glucose-Capillary: 100 mg/dL — ABNORMAL HIGH (ref 70–99)
Glucose-Capillary: 80 mg/dL (ref 70–99)
Glucose-Capillary: 98 mg/dL (ref 70–99)
Glucose-Capillary: 108 mg/dL — ABNORMAL HIGH (ref 70–99)

## 2019-11-14 NOTE — Progress Notes (Addendum)
[facility] [place]  Subjective  - Feels fine, toes on the left foot tender.   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 female is s/p:  1.Harvest left greater saphenous vein 2.Left common femoral, SFA, profundofemoral endarterectomy 3.Left common femoral to above-knee popliteal artery bypass with ipsilateral, translocated, nonreversed greater saphenous vein   Patent bypass with good doppler signals Will cont. To watch the left 5th toe Tm 101.4 this am tylenol given this am, IS at bedside patient demonstrated use. WBC 9.4 BB held HGB 9.3 surgical blood loss 300 ml Will watch for now and make adjustments as needed.   Roxy Horseman 11/14/2019 7:24 AM --  Laboratory Lab Results: Recent Labs    11/13/19 0138  WBC 9.4  HGB 9.3*  HCT 28.7*  PLT 266   BMET Recent Labs    11/13/19 0138  NA 132*  K 4.4  CL 100  CO2 25  GLUCOSE 214*  BUN 14  CREATININE 1.01*  CALCIUM 8.0*    COAG Lab Results  Component Value Date   INR 1.0 11/08/2019   No results found for: PTT   I have independently interviewed and examined patient and agree with PA assessment and plan above. Strong signals. Having some episodes of lightheadedness and relative hypotension with activity. Labs are stable. Allowing left small toe to demarcate.  Jeslyn Amsler C. Donzetta Matters, MD [facility] [place] Office: [phone] Pager: [phone]

## 2019-11-14 NOTE — Progress Notes (Signed)
Physical Therapy Treatment Patient Details Name: Julie Prince MRN: [id number] DOB: Jul 20, 1960 Today's Date: 11/14/2019    History of Present Illness Pt is a 59 y.o. female now s/p Harvest of left greater saphenous vein; left common femoral, SFA, profundofemoral endarterectomy; left common femoral to above knee popliteal artery bypass on 8/24. PMHx includes HTN, PVD, DM, hx abdominal aortogram (most recent 08/2019).     PT Comments    Pt making steady progress. BP remains soft with pt reporting slight lightheadedness. BP 88/52 sitting BSC. BP sitting in chair after amb 96/53.    Follow Up Recommendations  Home health PT;Supervision for mobility/OOB     Equipment Recommendations  Rolling walker with 5" wheels    Recommendations for Other Services       Precautions / Restrictions Precautions Precautions: Fall Precaution Comments: watch BP    Mobility  Bed Mobility               General bed mobility comments: Pt sitting EOB  Transfers Overall transfer level: Needs assistance Equipment used: Rolling walker (2 wheeled) Transfers: Sit to/from [employer] Sit to Stand: Min guard Stand pivot transfers: Min guard       General transfer comment: Assist for safety. Bed to bsc to bed with rolling walker  Ambulation/Gait Ambulation/Gait assistance: Min guard Gait Distance (Feet): 60 Feet Assistive device: Rolling walker (2 wheeled) Gait Pattern/deviations: Decreased stride length;Wide base of support;Decreased stance time - left;Step-through pattern Gait velocity: decr Gait velocity interpretation: <1.31 ft/sec, indicative of household ambulator General Gait Details: Assist for safety   Stairs             Wheelchair Mobility    Modified Rankin (Stroke Patients Only)       Balance Overall balance assessment: Needs assistance Sitting-balance support: Feet supported Sitting balance-Leahy Scale: Good     Standing balance support: Single  extremity supported Standing balance-Leahy Scale: Poor Standing balance comment: UE support                            Cognition Arousal/Alertness: Awake/alert Behavior During Therapy: WFL for tasks assessed/performed Overall Cognitive Status: Within Functional Limits for tasks assessed                                        Exercises      General Comments General comments (skin integrity, edema, etc.): BP is soft. 88/52 sitting on bsc. 96/53 sitting after amb. Pt reports slight lightheadedness.       Pertinent Vitals/Pain Pain Assessment: Faces Faces Pain Scale: Hurts little more Pain Location: L leg incision Pain Descriptors / Indicators: Operative site guarding;Grimacing;Sore Pain Intervention(s): Limited activity within patient's tolerance    Home Living                      Prior Function            PT Goals (current goals can now be found in the care plan section) Acute Rehab PT Goals Patient Stated Goal: to go home with her daughter Progress towards PT goals: Progressing toward goals    Frequency    Min 3X/week      PT Plan Current plan remains appropriate    Co-evaluation              AM-PAC PT "6 Clicks" Mobility   Outcome  Measure  Help needed turning from your back to your side while in a flat bed without using bedrails?: A Little Help needed moving from lying on your back to sitting on the side of a flat bed without using bedrails?: A Little Help needed moving to and from a bed to a chair (including a wheelchair)?: A Little Help needed standing up from a chair using your arms (e.g., wheelchair or bedside chair)?: A Little Help needed to walk in hospital room?: A Little Help needed climbing 3-5 steps with a railing? : A Lot 6 Click Score: 17    End of Session Equipment Utilized During Treatment: Gait belt Activity Tolerance: Patient tolerated treatment well Patient left: in bed;with call bell/phone  within reach;with bed alarm set Nurse Communication: Mobility status PT Visit Diagnosis: Unsteadiness on feet (R26.81);Muscle weakness (generalized) (M62.81);Pain Pain - Right/Left: Left Pain - part of body: Leg     Time: 0945-1010 PT Time Calculation (min) (ACUTE ONLY): 25 min  Charges:  $Gait Training: 23-37 mins                     [place] Pager [phone] Office [place] 11/14/2019, 10:40 AM

## 2019-11-14 NOTE — Progress Notes (Signed)
Mobility Specialist - Progress Note   11/14/19 1443  Mobility  Activity Ambulated in hall  Level of Assistance Modified independent, requires aide device or extra time  Assistive Device Front wheel walker  Distance Ambulated (ft) 80 ft  Mobility Response Tolerated well  Mobility performed by Mobility specialist  $Mobility charge 1 Mobility    Pre-mobility: 95 HR, 128/70 BP, 97% SpO2 Post-mobility: 95 HR, 114/58 BP, 99% SpO2  Pt was able to stand from chair and lay down in bed w/o any assistance. She denied any pain, dizziness, or lightheadedness while ambulating.   Pricilla Handler Mobility Specialist Mobility Specialist Phone: [phone]

## 2019-11-15 LAB — GLUCOSE, CAPILLARY
Glucose-Capillary: 129 mg/dL — ABNORMAL HIGH (ref 70–99)
Glucose-Capillary: 83 mg/dL (ref 70–99)
Glucose-Capillary: 108 mg/dL — ABNORMAL HIGH (ref 70–99)
Glucose-Capillary: 116 mg/dL — ABNORMAL HIGH (ref 70–99)

## 2019-11-15 NOTE — Progress Notes (Signed)
Mobility Specialist - Progress Note   11/15/19 1302  Mobility  Activity Ambulated in hall  Level of Assistance Modified independent, requires aide device or extra time  Assistive Device Front wheel walker  Distance Ambulated (ft) 220 ft  Mobility Response Tolerated well  Mobility performed by Mobility specialist  $Mobility charge 1 Mobility    Pre-mobility: 86 HR During mobility: 91 HR Post-mobility: 88 HR  Pt states her soreness is improving.   Pricilla Handler Mobility Specialist Mobility Specialist Phone: [phone]

## 2019-11-15 NOTE — Progress Notes (Signed)
Physical Therapy Treatment Patient Details Name: Julie Prince MRN: [id number] DOB: 29-Mar-1960 Today's Date: 11/15/2019    History of Present Illness Pt is a 59 y.o. female now s/p Harvest of left greater saphenous vein; left common femoral, SFA, profundofemoral endarterectomy; left common femoral to above knee popliteal artery bypass on 8/24. PMHx includes HTN, PVD, DM, hx abdominal aortogram (most recent 08/2019).     PT Comments    Patient progressing well towards PT goals. Improved ambulation distance with supervision for safety and use of RW. Pain is more controlled today. Able to perform toileting and ADL task at sink without difficulty. Encouraged increasing activity/mobility and walking to bathroom instead of using BSC during hospitalization. Continues to be limited mainly by pain and decreased endurance/fatigue. BP stable today and no lightheadedness. Will follow.   Follow Up Recommendations  No PT follow up;Supervision for mobility/OOB     Equipment Recommendations  Rolling walker with 5" wheels    Recommendations for Other Services       Precautions / Restrictions Precautions Precautions: Fall Precaution Comments: watch BP Restrictions Weight Bearing Restrictions: Yes LLE Weight Bearing: Weight bearing as tolerated    Mobility  Bed Mobility Overal bed mobility: Needs Assistance Bed Mobility: Supine to Sit     Supine to sit: Supervision;HOB elevated     General bed mobility comments: increased time and use of rails to get to EOB, no physical assist.  Transfers Overall transfer level: Needs assistance Equipment used: Rolling walker (2 wheeled) Transfers: Sit to/from Stand Sit to Stand: Supervision         General transfer comment: Supervision for safety. Stood from [employer], from toilet x1, transferred to chair post ambulation.  Ambulation/Gait Ambulation/Gait assistance: Supervision Gait Distance (Feet): 150 Feet Assistive device: Rolling walker (2  wheeled) Gait Pattern/deviations: Wide base of support;Decreased stance time - left;Step-through pattern;Antalgic Gait velocity: decreased Gait velocity interpretation: <1.31 ft/sec, indicative of household ambulator General Gait Details: Slow, mostly steady gait with decreased stance time LLE due to pain, able to place heel on ground, cues for heel to toe pattern. 1 standing rest break.   Stairs             Wheelchair Mobility    Modified Rankin (Stroke Patients Only)       Balance Overall balance assessment: Needs assistance Sitting-balance support: Feet supported;No upper extremity supported Sitting balance-Leahy Scale: Good     Standing balance support: During functional activity Standing balance-Leahy Scale: Fair Standing balance comment: Able to stand at sink and wash hands without UE support, does better with UEs support for walking.                            Cognition Arousal/Alertness: Awake/alert Behavior During Therapy: WFL for tasks assessed/performed Overall Cognitive Status: Within Functional Limits for tasks assessed                                        Exercises      General Comments General comments (skin integrity, edema, etc.): Sitting BP 119/65, sitting BP post walking 138/67, asymptomatic.      Pertinent Vitals/Pain Pain Assessment: 0-10 Pain Score: 5  Pain Location: L leg incision Pain Descriptors / Indicators: Operative site guarding;Sore Pain Intervention(s): Monitored during session;Repositioned    Home Living  Prior Function            PT Goals (current goals can now be found in the care plan section) Progress towards PT goals: Progressing toward goals    Frequency    Min 3X/week      PT Plan Discharge plan needs to be updated    Co-evaluation              AM-PAC PT "6 Clicks" Mobility   Outcome Measure  Help needed turning from your back to your  side while in a flat bed without using bedrails?: None Help needed moving from lying on your back to sitting on the side of a flat bed without using bedrails?: A Little Help needed moving to and from a bed to a chair (including a wheelchair)?: None Help needed standing up from a chair using your arms (e.g., wheelchair or bedside chair)?: None Help needed to walk in hospital room?: None Help needed climbing 3-5 steps with a railing? : A Little 6 Click Score: 22    End of Session Equipment Utilized During Treatment: Gait belt Activity Tolerance: Patient tolerated treatment well Patient left: in chair;with call bell/phone within reach Nurse Communication: Mobility status PT Visit Diagnosis: Unsteadiness on feet (R26.81);Muscle weakness (generalized) (M62.81);Pain Pain - Right/Left: Left Pain - part of body: Leg     Time: 9476-5465 PT Time Calculation (min) (ACUTE ONLY): 24 min  Charges:  $Gait Training: 8-22 mins $Therapeutic Activity: 8-22 mins                     Marisa Severin, PT, DPT Acute Rehabilitation Services Pager [phone] Office [place] 11/15/2019, 8:23 AM

## 2019-11-15 NOTE — TOC Initial Note (Signed)
Transition of Care [facility]) - Initial/Assessment Note    Patient Details  Name: Julie Prince MRN: [id number] Date of Birth: January 10, 1961  Transition of Care [facility]) CM/SW Contact:    Curlene Labrum, RN Phone Number: 11/15/2019, 11:08 AM  Clinical Narrative:                 Case Management met with the patient who lives at home alone.  The patient is planning to discharge home with her daughter, Julie Prince to [place]. [place], [place].  PT note from today did not recommend PT follow up.  Spoke with the patient and RW was recommended by PT - Called [employer] and RW to be delivered to the room.  Patient plans to have the daughter drive her home for discharge.  Expected Discharge Plan: Home/Self Care Barriers to Discharge: No Barriers Identified ([employer] called to deliver a RW)   Patient Goals and CMS Choice Patient states their goals for this hospitalization and ongoing recovery are:: Plans to discharge home with daughter to [facility] address CMS Medicare.gov Compare Post Acute Care list provided to:: Patient Choice offered to / list presented to : Patient  Expected Discharge Plan and Services Expected Discharge Plan: Home/Self Care   Discharge Planning Services: CM Consult Post Acute Care Choice: Durable Medical Equipment Living arrangements for the past 2 months: Single Family Home                 DME Arranged: Walker rolling DME Agency: [employer] Date DME Agency Contacted: 11/15/19 Time DME Agency Contacted: 1107 Representative spoke with at DME Agency: [place]            Prior Living Arrangements/Services Living arrangements for the past 2 months: [place] Lives with:: Adult Children (discharging home with daughter Julie Prince) Patient language and need for interpreter reviewed:: Yes Do you feel safe going back to the place where you live?: Yes      Need for Family Participation in Patient Care: Yes (Comment) Care giver support system in place?: Yes  (comment)   Criminal Activity/Legal Involvement Pertinent to Current Situation/Hospitalization: No - Comment as needed  Activities of Daily Living      Permission Sought/Granted Permission sought to share information with : Case Manager Permission granted to share information with : Yes, Verbal Permission Granted     Permission granted to share info w AGENCY: [employer] for dme  Permission granted to share info w Relationship: daughter     Emotional Assessment Appearance:: Appears stated age Attitude/Demeanor/Rapport: Gracious Affect (typically observed): Accepting Orientation: : Oriented to Self, Oriented to Place, Oriented to  Time, Oriented to Situation Alcohol / Substance Use: Not Applicable Psych Involvement: No (comment)  Admission diagnosis:  Peripheral arterial occlusive disease ([place]) [I77.9] Patient Active Problem List   Diagnosis Date Noted  . Peripheral arterial occlusive disease ([place]) 11/12/2019  . Stress due to illness of family member 09/05/2018  . PAD (peripheral artery disease) [facility])-  See's Cards- Dr Elaina Pattee Parkman/ that group for this condition 05/28/2018  . Foot pain, left 04/20/2018  . Trigger ring finger 04/20/2018  . Diabetic retinopathy associated with diabetes mellitus due to underlying condition ([place]) 04/20/2018  . 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 Diabetes mellitus due to underlying condition with stage 3 chronic kidney disease, without long-term current use of insulin ([place]) 07/12/2017  . Mixed diabetic hyperlipidemia associated with type 2 diabetes mellitus ([place]) 07/12/2017  . Hypertension associated with diabetes ([place]) 07/12/2017  . Obesity, Class III, morbid  obesity  ([place]) 07/12/2017  . H/O medication noncompliance 07/12/2017  . Barrett's esophagus 03/11/2016  . 1st degree AV block 03/01/2016  . Tachycardia with hypertension 03/01/2016  . Dysphagia 02/10/2016  . Vulvar abscess   . Sepsis ([place]) 06/21/2015  . GERD (gastroesophageal reflux  disease) 07/31/2014  . Adenomatous polyp of colon 04/03/2014  . Allergic rhinitis 01/23/2014  . CTS (carpal tunnel syndrome) 10/03/2013  . Uncontrolled type 2 diabetes mellitus with hyperglycemia ([place]) 10/03/2013  . Low back pain 10/03/2013  . Lumbar arthropathy 10/03/2013  . Lumbar degenerative disc disease 10/03/2013  . Neuropathy 10/03/2013  . Osteoarthritis 10/03/2013  . 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 Tenosynovitis of foot 10/03/2013  . Currently controlled diabetes mellitus type 2 with complications ([place]) 11/00/3496  . Hyperlipidemia 09/12/2013  . Essential hypertension, benign 09/12/2013  . Diabetic peripheral neuropathy associated with type 2 diabetes mellitus ([place]) 09/12/2013   PCP:  [name], PA-C Pharmacy:   [place], [address] [place] [address] Phone: [phone] Fax: [phone]     Social Determinants of Health (SDOH) Interventions    Readmission Risk Interventions Readmission Risk Prevention Plan 11/15/2019  Transportation Screening Complete  PCP or Specialist Appt within 5-7 Days Complete  Home Care Screening Complete  Medication Review (RN CM) Complete  Some recent data might be hidden

## 2019-11-15 NOTE — Progress Notes (Signed)
Occupational Therapy Treatment Patient Details Name: Julie Prince MRN: [id number] DOB: [birth year] Today's Date: 11/15/2019    History of present illness Pt is a 59 y.o. female now s/p Harvest of left greater saphenous vein; left common femoral, SFA, profundofemoral endarterectomy; left common femoral to above knee popliteal artery bypass on 8/24. PMHx includes HTN, PVD, DM, hx abdominal aortogram (most recent 08/2019).    OT comments  Pt progressing well this session to sink level grooming supervision level. Pt educated on washing around incision site with clean fresh linen. Pt at adequate level to d/c home with family.   Follow Up Recommendations  Home health OT;Supervision/Assistance - 24 hour    Equipment Recommendations  Tub/shower seat    Recommendations for Other Services      Precautions / Restrictions Precautions Precautions: Fall Precaution Comments: watch BP       Mobility Bed Mobility               General bed mobility comments: oob in chair  Transfers Overall transfer level: Needs assistance   Transfers: Sit to/from Stand Sit to Stand: Supervision              Balance                                           ADL either performed or assessed with clinical judgement   ADL Overall ADL's : Needs assistance/impaired Eating/Feeding: Modified independent;Sitting   Grooming: Wash/dry face;Oral care;Supervision/safety;Standing               Lower Body Dressing: Moderate assistance;Sit to/from stand Lower Body Dressing Details (indicate cue type and reason): unable to cross surgerical leg at this time. plans to have family (A)  Toilet Transfer: Supervision/safety           Functional mobility during ADLs: Supervision/safety       Vision       Perception     Praxis      Cognition Arousal/Alertness: Awake/alert Behavior During Therapy: WFL for tasks assessed/performed Overall Cognitive Status: Within  Functional Limits for tasks assessed                                          Exercises     Shoulder Instructions       General Comments VSS    Pertinent Vitals/ Pain       Pain Assessment: No/denies pain  Home Living                                          Prior Functioning/Environment              Frequency  Min 2X/week        Progress Toward Goals  OT Goals(current goals can now be found in the care plan section)  Progress towards OT goals: Progressing toward goals  Acute Rehab OT Goals Patient Stated Goal: to go home with her daughter OT Goal Formulation: With patient Time For Goal Achievement: 11/27/19 Potential to Achieve Goals: Good ADL Goals Pt Will Perform Grooming: with supervision;standing Pt Will Perform Lower Body Bathing: with min guard assist;sitting/lateral leans;sit to/from stand Pt Will Perform Lower Body Dressing:  with min guard assist;sit to/from stand;sitting/lateral leans Pt Will Transfer to Toilet: with min guard assist;ambulating Pt Will Perform Toileting - Clothing Manipulation and hygiene: with min guard assist;sit to/from stand;sitting/lateral leans  Plan Discharge plan remains appropriate    Co-evaluation                 AM-PAC OT "6 Clicks" Daily Activity     Outcome Measure   Help from another person eating meals?: None Help from another person taking care of personal grooming?: A Little Help from another person toileting, which includes using toliet, bedpan, or urinal?: A Lot Help from another person bathing (including washing, rinsing, drying)?: A Lot Help from another person to put on and taking off regular upper body clothing?: A Little Help from another person to put on and taking off regular lower body clothing?: A Lot 6 Click Score: 16    End of Session Equipment Utilized During Treatment: Gait belt;Rolling walker  OT Visit Diagnosis: Other abnormalities of gait and  mobility (R26.89);Pain Pain - Right/Left: Left Pain - part of body: Leg   Activity Tolerance Patient tolerated treatment well   Patient Left in chair;with call bell/phone within reach   Nurse Communication Mobility status;Other (comment)        Time: 1427-6701 OT Time Calculation (min): 17 min  Charges: OT General Charges $OT Visit: 1 Visit OT Treatments $Self Care/Home Management : 8-22 mins   Brynn, OTR/L  Acute Rehabilitation Services Pager: [phone] Office: [phone] .    Jeri Modena 11/15/2019, 2:35 PM

## 2019-11-15 NOTE — Progress Notes (Addendum)
[facility] [place]  Subjective  - doing better each day.   Objective 126/60 94 98.8 F (37.1 C) (Oral) 14 97%  Intake/Output Summary (Last 24 hours) at 11/15/2019 0740 Last data filed at 11/14/2019 1000 Gross per 24 hour  Intake --  Output 700 ml  Net -700 ml    Left groin soft, 4 x 4 covering the incision Leg incisions healing well Doppler signals PT/DP intact, no change in 5th toe dry gangrene Heart RRR  Lungs non labored breathing, no fever since 11/14/19 am  Assessment/Planning: POD # 3 1.Harvest left greater saphenous vein 2.Left common femoral, SFA, profundofemoral endarterectomy 3.Left common femoral to above-knee popliteal artery bypass with ipsilateral, translocated, nonreversed greater saphenous vein  Patent flow with good doppler signals Will cont. To observe 5th toe for demarcation Encouraged ambulation BP stable and afebrile D/C plan pending mobility and am labs HH and rolling walker ordered   Roxy Horseman 11/15/2019 7:40 AM --  Laboratory Lab Results: Recent Labs    11/13/19 0138  WBC 9.4  HGB 9.3*  HCT 28.7*  PLT 266   BMET Recent Labs    11/13/19 0138  NA 132*  K 4.4  CL 100  CO2 25  GLUCOSE 214*  BUN 14  CREATININE 1.01*  CALCIUM 8.0*    COAG Lab Results  Component Value Date   INR 1.0 11/08/2019   No results found for: PTT  I have independently interviewed and examined patient and agree with PA assessment and plan above.  Cephalad vein harvest incision has opening approximately 1 cm.  There is no active extravasation from the site should be okay with dry dressing.  Foot is well-perfused.  We again discussed left small toe amputation patient would rather give this some time.  Strong signals at the foot consistent with bypass patency.  Hopeful for discharge in the next 2 to 3 days.  Saxton Chain C. Donzetta Matters, MD [facility] [place] Office: [phone] Pager:  [phone]

## 2019-11-16 LAB — CBC
HCT: 25 % — ABNORMAL LOW (ref 36.0–46.0)
Hemoglobin: 8 g/dL — ABNORMAL LOW (ref 12.0–15.0)
MCH: 30.8 pg (ref 26.0–34.0)
MCHC: 32 g/dL (ref 30.0–36.0)
MCV: 96.2 fL (ref 80.0–100.0)
Platelets: 263 10*3/uL (ref 150–400)
RBC: 2.6 MIL/uL — ABNORMAL LOW (ref 3.87–5.11)
RDW: 12.2 % (ref 11.5–15.5)
WBC: 7.9 10*3/uL (ref 4.0–10.5)
nRBC: 0 % (ref 0.0–0.2)

## 2019-11-16 LAB — BASIC METABOLIC PANEL
Anion gap: 9 (ref 5–15)
BUN: <5 mg/dL — ABNORMAL LOW (ref 6–20)
CO2: 25 mmol/L (ref 22–32)
Calcium: 8.2 mg/dL — ABNORMAL LOW (ref 8.9–10.3)
Chloride: 107 mmol/L (ref 98–111)
Creatinine, Ser: 0.82 mg/dL (ref 0.44–1.00)
GFR calc Af Amer: >60 mL/min (ref >60)
GFR calc non Af Amer: >60 mL/min (ref >60)
Glucose, Bld: 72 mg/dL (ref 70–99)
Potassium: 3.2 mmol/L — ABNORMAL LOW (ref 3.5–5.1)
Sodium: 141 mmol/L (ref 135–145)

## 2019-11-16 LAB — GLUCOSE, CAPILLARY
Glucose-Capillary: 76 mg/dL (ref 70–99)
Glucose-Capillary: 61 mg/dL — ABNORMAL LOW (ref 70–99)
Glucose-Capillary: 73 mg/dL (ref 70–99)
Glucose-Capillary: 123 mg/dL — ABNORMAL HIGH (ref 70–99)
Glucose-Capillary: 151 mg/dL — ABNORMAL HIGH (ref 70–99)

## 2019-11-16 NOTE — Progress Notes (Signed)
Occupational Therapy Treatment Patient Details Name: Julie Prince MRN: [id number] DOB: 07/10/1960 Today's Date: 11/16/2019    History of present illness Pt is a 58 y.o. female now s/p Harvest of left greater saphenous vein; left common femoral, SFA, profundofemoral endarterectomy; left common femoral to above knee popliteal artery bypass on 8/24. PMHx includes HTN, PVD, DM, hx abdominal aortogram (most recent 08/2019).    OT comments  Patient continues to make steady progress towards goals in skilled OT session. Patient's session encompassed ADLs at the sink and functional mobility in order to increase overall activity tolerance and endurance. Pt continues to make gains in endurance, and able to listen to her body to appropriately acknowledge when to turn around to complete ADLs prior to fatiguing. Pt educated on various energy conservation techniques (teach back noted) and pt left with all needs met. Discharge remains appropriate; will continue to follow acutely.    Follow Up Recommendations  Home health OT;Supervision/Assistance - 24 hour    Equipment Recommendations  Tub/shower seat    Recommendations for Other Services      Precautions / Restrictions Precautions Precautions: Fall Precaution Comments: watch BP Restrictions Weight Bearing Restrictions: No LLE Weight Bearing: Weight bearing as tolerated       Mobility Bed Mobility Overal bed mobility: Needs Assistance Bed Mobility: Supine to Sit     Supine to sit: Supervision;HOB elevated        Transfers Overall transfer level: Needs assistance Equipment used: Rolling walker (2 wheeled) Transfers: Sit to/from Stand Sit to Stand: Supervision         General transfer comment: Supervision for safety, ambulated to the sink to complete ADLs, then into hallway for further ambulation, turning around halfway down the hall and walking to recliner at end of session    Balance Overall balance assessment: Needs  assistance Sitting-balance support: Feet supported;No upper extremity supported Sitting balance-Leahy Scale: Good     Standing balance support: During functional activity Standing balance-Leahy Scale: Fair Standing balance comment: Able to stand at sink and wash hands without UE support, does better with UEs support for walking.                           ADL either performed or assessed with clinical judgement   ADL Overall ADL's : Needs assistance/impaired     Grooming: Wash/dry face;Oral care;Supervision/safety;Standing;Wash/dry hands Grooming Details (indicate cue type and reason): Standing at sink                             Functional mobility during ADLs: Supervision/safety General ADL Comments: pt conitnues to progress     Vision       Perception     Praxis      Cognition Arousal/Alertness: Awake/alert Behavior During Therapy: WFL for tasks assessed/performed Overall Cognitive Status: Within Functional Limits for tasks assessed                                          Exercises     Shoulder Instructions       General Comments      Pertinent Vitals/ Pain       Pain Assessment: No/denies pain  Home Living                                            Prior Functioning/Environment              Frequency  Min 2X/week        Progress Toward Goals  OT Goals(current goals can now be found in the care plan section)  Progress towards OT goals: Progressing toward goals  Acute Rehab OT Goals Patient Stated Goal: to go home with her daughter OT Goal Formulation: With patient Time For Goal Achievement: 11/27/19 Potential to Achieve Goals: Good  Plan Discharge plan remains appropriate    Co-evaluation                 AM-PAC OT "6 Clicks" Daily Activity     Outcome Measure   Help from another person eating meals?: None Help from another person taking care of personal grooming?:  None Help from another person toileting, which includes using toliet, bedpan, or urinal?: A Little Help from another person bathing (including washing, rinsing, drying)?: A Little Help from another person to put on and taking off regular upper body clothing?: A Little Help from another person to put on and taking off regular lower body clothing?: A Little 6 Click Score: 20    End of Session Equipment Utilized During Treatment: Gait belt;Rolling walker  OT Visit Diagnosis: Other abnormalities of gait and mobility (R26.89);Pain   Activity Tolerance Patient tolerated treatment well   Patient Left in chair;with call bell/phone within reach   Nurse Communication Mobility status        Time: 1124-1138 OT Time Calculation (min): 14 min  Charges: OT General Charges $OT Visit: 1 Visit OT Treatments $Self Care/Home Management : 8-22 mins  Mary Kate E. Young, COTA/L Acute Rehabilitation Services [phone]   Mary Kate Young 11/16/2019, 3:15 PM    

## 2019-11-16 NOTE — Progress Notes (Signed)
Mobility Specialist - Progress Note   11/16/19 1026  Mobility  Activity Refused mobility   Pt refused mobility as she is not feeling well, will f/u if able.   Pricilla Handler Mobility Specialist Mobility Specialist Phone: [phone]

## 2019-11-16 NOTE — Plan of Care (Signed)
Poc progressing.  

## 2019-11-16 NOTE — Progress Notes (Signed)
Mobility Specialist - Progress Note   11/16/19 1330  Mobility  Activity Transferred:  Chair to bed  Level of Assistance Standby assist, set-up cues, supervision of patient - no hands on  Assistive Device None  Mobility Response Tolerated well  Mobility performed by Mobility specialist  $Mobility charge 1 Mobility    Pt c/o feeling "whoozy" all day and that her blood sugar has been low, reporting a recent check had her in the 70s. Pt was left in bed and ordered her lunch, call bell left at her side.    Pricilla Handler Mobility Specialist Mobility Specialist Phone: [phone]

## 2019-11-16 NOTE — Progress Notes (Signed)
OT Cancellation Note  Patient Details Name: Sible Straley MRN: [id number] DOB: 04/11/1960   Cancelled Treatment:    Reason Eval/Treat Not Completed: Patient declined, no reason specified Pt feeling a little weak this morning due to low blood sugar, asking therapist to return in about an hour when she feels a little stronger. Will return as time permits.   Corinne Ports E. Leonard, [place] Acute Rehabilitation Services [place] 11/16/2019, 10:53 AM

## 2019-11-16 NOTE — Progress Notes (Signed)
  Progress Note    11/16/2019 11:40 AM 4 Days Post-Op  Subjective:  No overnight issues, foot is warm  Vitals:   11/16/19 0933 11/16/19 1037  BP: (!) 120/55 (!) 120/55  Pulse: 84 84  Resp: 16 16  Temp: 98.1 F (36.7 C) 98.1 F (36.7 C)  SpO2: 97%     Physical Exam: aaox3 Non labored respirations Left groin incision cdi High thigh incision superficial dehiscence Strong AT/DP signals   CBC    Component Value Date/Time   WBC 7.9 11/16/2019 0202   RBC 2.60 (L) 11/16/2019 0202   HGB 8.0 (L) 11/16/2019 0202   HGB 14.4 09/10/2019 1144   HCT 25.0 (L) 11/16/2019 0202   HCT 41.8 09/10/2019 1144   PLT 263 11/16/2019 0202   PLT 247 09/10/2019 1144   MCV 96.2 11/16/2019 0202   MCV 94 09/10/2019 1144   MCH 30.8 11/16/2019 0202   MCHC 32.0 11/16/2019 0202   RDW 12.2 11/16/2019 0202   RDW 11.8 09/10/2019 1144   LYMPHSABS 1.9 08/16/2019 0753   LYMPHSABS 2.3 12/26/2018 0941   MONOABS 0.9 08/16/2019 0753   EOSABS 0.2 08/16/2019 0753   EOSABS 0.2 12/26/2018 0941   BASOSABS 0.0 08/16/2019 0753   BASOSABS 0.0 12/26/2018 0941    BMET    Component Value Date/Time   NA 141 11/16/2019 0202   NA 139 09/10/2019 1144   K 3.2 (L) 11/16/2019 0202   CL 107 11/16/2019 0202   CO2 25 11/16/2019 0202   GLUCOSE 72 11/16/2019 0202   BUN <5 (L) 11/16/2019 0202   BUN 10 09/10/2019 1144   CREATININE 0.82 11/16/2019 0202   CREATININE 1.07 (H) 04/26/2016 1412   CALCIUM 8.2 (L) 11/16/2019 0202   GFRNONAA >60 11/16/2019 0202   GFRNONAA 59 (L) 04/26/2016 1412   GFRAA >60 11/16/2019 0202   GFRAA 68 04/26/2016 1412    INR    Component Value Date/Time   INR 1.0 11/08/2019 1144     Intake/Output Summary (Last 24 hours) at 11/16/2019 1140 Last data filed at 11/16/2019 0731 Gross per 24 hour  Intake 360 ml  Output 3600 ml  Net -3240 ml     Assessment:  59 y.o. female is s/p left fem-pop for gangrene of small toe Plan: Does not want toe amputation at this time Wet to dry left  medial thigh incision Continue to mobilize Asa/statin subq heparin dvt ppx   Ole Lafon C. Donzetta Matters, MD [facility] [place] Office: [phone] Pager: [phone]  11/16/2019 11:40 AM

## 2019-11-16 NOTE — Progress Notes (Signed)
Left leg wound dressing changed per MD order without difficulty.  Pt tolerated dressing change well.  Will continue to monitor.

## 2019-11-17 LAB — GLUCOSE, CAPILLARY
Glucose-Capillary: 54 mg/dL — ABNORMAL LOW (ref 70–99)
Glucose-Capillary: 137 mg/dL — ABNORMAL HIGH (ref 70–99)
Glucose-Capillary: 92 mg/dL (ref 70–99)
Glucose-Capillary: 66 mg/dL — ABNORMAL LOW (ref 70–99)

## 2019-11-17 MED ORDER — OXYCODONE-ACETAMINOPHEN 5-325 MG PO TABS: 1.0000 | ORAL_TABLET | ORAL | 0 refills | Status: DC | PRN

## 2019-11-17 NOTE — Care Management (Signed)
1140 11-17-19 Shower chair to be delivered to the patient's room prior to transition home. Patient states she will be with daughter for six weeks. No further needs from Case Manager at this time. Bethena Roys, RN,BSN Case Manager

## 2019-11-17 NOTE — Progress Notes (Signed)
Mobility Specialist - Progress Note   11/17/19 1130  Mobility  Activity Ambulated in hall  Level of Assistance Modified independent, requires aide device or extra time  Assistive Device Front wheel walker  Distance Ambulated (ft) 115 ft  Mobility Response Tolerated well  Mobility performed by Mobility specialist  $Mobility charge 1 Mobility    Pre-mobility: 90 HR During mobility: 93 HR Post-mobility: 92 HR  Pt stated she felt fatigued while ambulating as she was still waking up.   Pricilla Handler Mobility Specialist Mobility Specialist Phone: [phone]

## 2019-11-17 NOTE — Progress Notes (Addendum)
Progress Note    11/17/2019 7:49 AM 5 Days Post-Op  Subjective:  States she is doing well. Some surgical site pain but overall no complaints   Vitals:   11/16/19 2351 11/17/19 0403  BP: 123/66 (!) 114/55  Pulse: 80 88  Resp:    Temp: 98.6 F (37 C) 98.1 F (36.7 C)  SpO2: 96% 96%   Physical Exam: Cardiac: regular rate and rhythm Lungs: non labored Incisions:  Left saphenectomy site superficial dehiscence. Pink granulation tissue in wound bed. No drainage. Wet to dry dressings applied. Left groin, thigh and popliteal incisions intact and healing well Extremities: left lower extremity well perfused and warm.  Brisk DP/ PT signals. 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 Abdomen: obese, soft  Non tender Neurologic:alert and oriented  CBC    Component Value Date/Time   WBC 7.9 11/16/2019 0202   RBC 2.60 (L) 11/16/2019 0202   HGB 8.0 (L) 11/16/2019 0202   HGB 14.4 09/10/2019 1144   HCT 25.0 (L) 11/16/2019 0202   HCT 41.8 09/10/2019 1144   PLT 263 11/16/2019 0202   PLT 247 09/10/2019 1144   MCV 96.2 11/16/2019 0202   MCV 94 09/10/2019 1144   MCH 30.8 11/16/2019 0202   MCHC 32.0 11/16/2019 0202   RDW 12.2 11/16/2019 0202   RDW 11.8 09/10/2019 1144   LYMPHSABS 1.9 08/16/2019 0753   LYMPHSABS 2.3 12/26/2018 0941   MONOABS 0.9 08/16/2019 0753   EOSABS 0.2 08/16/2019 0753   EOSABS 0.2 12/26/2018 0941   BASOSABS 0.0 08/16/2019 0753   BASOSABS 0.0 12/26/2018 0941    BMET    Component Value Date/Time   NA 141 11/16/2019 0202   NA 139 09/10/2019 1144   K 3.2 (L) 11/16/2019 0202   CL 107 11/16/2019 0202   CO2 25 11/16/2019 0202   GLUCOSE 72 11/16/2019 0202   BUN <5 (L) 11/16/2019 0202   BUN 10 09/10/2019 1144   CREATININE 0.82 11/16/2019 0202   CREATININE 1.07 (H) 04/26/2016 1412   CALCIUM 8.2 (L) 11/16/2019 0202   GFRNONAA >60 11/16/2019 0202   GFRNONAA 59 (L) 04/26/2016 1412   GFRAA >60 11/16/2019 0202   GFRAA 68 04/26/2016 1412    INR    Component Value Date/Time   INR  1.0 11/08/2019 1144     Intake/Output Summary (Last 24 hours) at 11/17/2019 0749 Last data filed at 11/17/2019 0500 Gross per 24 hour  Intake 200 ml  Output 450 ml  Net -250 ml     Assessment/Plan:  59 y.o. female is s/p left fem- pop bypass with ipsilateral translocated non reversed GSV 5 Days Post-Op. Patent left lower extremity bypass with brisk doppler signals. Left foot warm. Will continue to allow 5th toe to demarcate. Left thigh superficial dehiscence continue wet to dry dressing changes twice daily. Dry gauze to left groin. Hemodynamically stable. Hgb is down slightly to 8 from 9.3. Has tolerated ambulating. Tolerating diet and voiding without difficulty. No PT follow up recommended. Tub/ shower seat ordered for home. Patient already has DME home rolling walker. Patient otherwise stable for discharge. She will continue her Aspirin, Statin and Pletal. PDMP was reviewed and prescription for pain medication sent to patients pharmacy. She will follow up with Dr. Donzetta Matters in 3 weeks   DVT prophylaxis:  Sq heparin   Karoline Caldwell, PA-C [facility] [phone] 11/17/2019 7:49 AM   I have independently interviewed and examined patient and agree with PA assessment and plan above.   Oasis Goehring C. Donzetta Matters, MD  [facility] [place] Office: [phone] Pager: [phone]

## 2019-11-17 NOTE — Discharge Summary (Signed)
Bypass Discharge Summary Patient ID: Julie Prince [id number] 59 y.o. Aug 26, 1960  Admit date: 11/12/2019  Discharge date and time: 11/17/2019  Admitting Physician: Waynetta Sandy, MD   Discharge Physician: Waynetta Sandy, MD  Admission Diagnoses: Peripheral arterial occlusive disease [facility]) [I77.9]  Discharge Diagnoses: Peripheral arterial occlusive disease  Admission Condition: fair  Discharged Condition: good  Indication for Admission: arterial occlusive disease with left 5th toe ulceration  Hospital Course: 59 year old female admitted on 11/12/19 and underwent harvest of left greater saphenous vein, left common femoral, SFA and profundofemoral endarterectomy, left common femoral to above knee popliteal artery bypass with ipsilateral, translocated, non reversed greater saphenous vein. She tolerated the procedure well and was transferred to the recovery room in stable condition.  She was seen in the recovery room in stable condition. Left lower extremity bypass patent with doppler DP/PT signals. Pain well controlled. She was later transferred to the floor. Had some mild hypotension that was improved with fluid bolus otherwise remained stable overnight  POD#1 doing well post op. Hemodynamically stable. Afebrile.Left lower extremity incisions intact. Doppler DP/PT signals in left foot. PT/OT evaluated and recommended HH OT/PT. Rolling walker and shower stool ordered.  POD#2 some sinus tachycardia and hypotension. Mildly symptomatic. Low grade fever. Left lower extremity incisions intact. Doppler signals in left foot. Hemodynamically stable. Pain well controlled. Ambulated with mobility specialist  POD#3 left groin intact and leg incisions healing well. Left lower extremity bypass patent. Doppler PT/ Dp signals left foot. Afebrile. Blood pressure stable. Encouraged increased mobilization. Small opening at saphenectomy site in proximal left thigh. No drainage  POD#  lower extremity bypass patent. Brisk doppler DP/PT signals. Pain minimal. Left thigh saphenectomy site dehiscence. Wet to dry dressings applied. Continuing to allow left 5th toe to demarcate. Afebrile. Hemodynamically stable  POD# 5 bypass remains patent and brisk DP/ PT signals on left foot. Tolerating ambulation. Tolerating diet and voiding. Will continue to allow 5th toe to demarcate. Left thigh superficial dehiscence continue wet to dry dressing changes twice daily. Dry gauze to left groin. Hemodynamically stable. Hgb is down slightly to 8 from 9.3. Has tolerated ambulating. Tolerating diet and voiding without difficulty. No PT follow up recommended. Tub/ shower seat ordered for home. Patient already has DME home rolling walker. Patient otherwise stable for discharge. She will continue her Aspirin, Statin and Pletal. PDMP was reviewed and prescription for pain medication sent to patients pharmacy. She will follow up with Dr. Donzetta Matters in 3 weeks  Consults: None  Treatments: therapies: PT and OT and surgery: left common femoral, SFA and profundofemoral endarterectomy, left GSV harvest, Left common femoral to below knee bypass with ipsilateral, translocated, non reversed greater saphenous vein   Disposition: Discharge disposition: 01-Home or Self Care       - For [facility] Registry use ---  Post-op:  Wound infection: No  Graft infection: No  Transfusion: No  If yes, 0 units given New Arrhythmia: No Patency judged by: [ ]  Dopper only, [ ]  Palpable graft pulse, [ ]  Palpable distal pulse, [ ]  ABI inc. > 0.15, [ ]  Duplex D/C Ambulatory Status: Ambulatory with Assistance  Complications: MI: [ ]  No, [ ]  Troponin only, [ ]  EKG or Clinical CHF: No Resp failure: [ ]  none, [ ]  Pneumonia, [ ]  Ventilator Chg in renal function: [ ]  none, [ ]  Inc. Cr > 0.5, [ ]  Temp. Dialysis, [ ]  Permanent dialysis Stroke: [ ]  None, [ ]  Minor, [ ]  Major Return to OR: No  Reason for return to OR: [ ]  Bleeding, [ ]   Infection, [ ]  Thrombosis, [ ]  Revision  Discharge medications: Statin use:  Yes ASA use:  Yes Plavix use:  No  for medical reason not indicated Beta blocker use: Yes Coumadin use: No  for medical reason not indicated    Patient Instructions:  Allergies as of 11/17/2019      Reactions   Penicillins Hives, Shortness Of Breath   Metformin And Related Diarrhea      Medication List    TAKE these medications   aspirin 81 MG chewable tablet Chew 81 mg by mouth daily.   atorvastatin 40 MG tablet Commonly known as: LIPITOR Take 1 tablet (40 mg total) by mouth at bedtime.   cilostazol 50 MG tablet Commonly known as: PLETAL TAKE 1 TABLET BY MOUTH TWICE A DAY   FreeStyle Libre 14 Day Sensor Misc 1 each by Other route See admin instructions. Test daily as directed and change every 14 days.   metoprolol tartrate 50 MG tablet Commonly known as: LOPRESSOR Take 1.5 tablets (75 mg total) by mouth 2 (two) times daily. What changed: how much to take   oxyCODONE-acetaminophen 5-325 MG tablet Commonly known as: PERCOCET/ROXICET Take 1-2 tablets by mouth every 4 (four) hours as needed for moderate pain.   Ozempic (0.25 or 0.5 MG/DOSE) 2 MG/1.5ML Sopn Generic drug: Semaglutide(0.25 or 0.5MG /DOS) Inject 0.25 mg [place] once weekly for 4 weeks. Then 0.5 mg [place] once weekly.   Tyler Aas FlexTouch 100 UNIT/ML FlexTouch Pen Generic drug: insulin degludec INJECT 50 UNITS SUBCUTANEOUS AT BEDTIME What changed: See the new instructions.   Vitamin D3 50 MCG (2000 UT) Tabs Take 2,000 Units by mouth daily.            Durable Medical Equipment  (From admission, onward)         Start     Ordered   11/17/19 0752  For home use only DME Shower stool  Once        11/17/19 0752   11/15/19 0751  For home use only DME Walker rolling  Once       Question Answer Comment  Walker: With [place] Wheels   Patient needs a walker to treat with the following condition Post-operative state      11/15/19 0750            Discharge Care Instructions  (From admission, onward)         Start     Ordered   11/17/19 0000  Discharge wound care:       Comments: Twice daily wet to dry dressing changes to left thigh. Dry gauze to left groin incision   11/17/19 0834         Activity: activity as tolerated and no driving while on analgesics Diet: diabetic diet Wound Care: keep wound clean and dry  Follow-up with Dr. Donzetta Matters in 2 weeks. She has a scheduled appointment on 12/06/2019  Signed: Karoline Caldwell 11/17/2019 8:35 AM

## 2019-11-18 ENCOUNTER — Emergency Department (HOSPITAL_COMMUNITY)
Admission: EM | Admit: 2019-11-18 | Discharge: 2019-11-18 | Disposition: A | Discharge status: Home / Self Care | Payer: 59 | Attending: Emergency Medicine | Admitting: Emergency Medicine

## 2019-11-18 ENCOUNTER — Telehealth: Payer: Self-pay

## 2019-11-18 ENCOUNTER — Other Ambulatory Visit: Payer: Self-pay

## 2019-11-18 ENCOUNTER — Encounter (HOSPITAL_COMMUNITY): Payer: Self-pay | Admitting: Emergency Medicine

## 2019-11-18 DIAGNOSIS — Z87891 Personal history of nicotine dependence: Secondary | ICD-10-CM | POA: Insufficient documentation

## 2019-11-18 DIAGNOSIS — E1122 Type 2 diabetes mellitus with diabetic chronic kidney disease: Secondary | ICD-10-CM | POA: Diagnosis not present

## 2019-11-18 DIAGNOSIS — T8131XA Disruption of external operation (surgical) wound, not elsewhere classified, initial encounter: Secondary | ICD-10-CM | POA: Diagnosis not present

## 2019-11-18 DIAGNOSIS — I129 Hypertensive chronic kidney disease with stage 1 through stage 4 chronic kidney disease, or unspecified chronic kidney disease: Secondary | ICD-10-CM | POA: Insufficient documentation

## 2019-11-18 DIAGNOSIS — N183 Chronic kidney disease, stage 3 unspecified: Secondary | ICD-10-CM | POA: Diagnosis not present

## 2019-11-18 DIAGNOSIS — Z7982 Long term (current) use of aspirin: Secondary | ICD-10-CM | POA: Insufficient documentation

## 2019-11-18 DIAGNOSIS — T8130XA Disruption of wound, unspecified, initial encounter: Secondary | ICD-10-CM

## 2019-11-18 DIAGNOSIS — Z794 Long term (current) use of insulin: Secondary | ICD-10-CM | POA: Insufficient documentation

## 2019-11-18 DIAGNOSIS — T82898A Other specified complication of vascular prosthetic devices, implants and grafts, initial encounter: Secondary | ICD-10-CM | POA: Insufficient documentation

## 2019-11-18 DIAGNOSIS — Z79899 Other long term (current) drug therapy: Secondary | ICD-10-CM | POA: Insufficient documentation

## 2019-11-18 NOTE — ED Triage Notes (Signed)
Had fem-pop bypass graft left leg - discharged yesterday, incision opened up at top of thigh-- gaping, no drainage noted,  Dr. Donzetta Matters did surgery.

## 2019-11-18 NOTE — ED Notes (Signed)
Redressed incision. Inner groin incision redressed with abd pad. Open incision to right upper thigh redressed with wet to dry dressing.

## 2019-11-18 NOTE — ED Notes (Signed)
Discharge instructions discussed with pt. Pt verbalized understanding with no questions at this time. Pt to go home with family member at bedside

## 2019-11-18 NOTE — Progress Notes (Signed)
Pt seen in ER.  Saphenectomy incision open high portion left medial thigh.  Groin incision intact.  Left foot warm.  Wound is 5 cm x 3 cm 2 mm depth. Counseled pt and her daughter on how to continue local wound care She has follow up with DR Donzetta Matters in a few weeks Can d/c home  Ruta Hinds, MD [facility]: [phone]

## 2019-11-18 NOTE — ED Provider Notes (Signed)
[facility] EMERGENCY DEPARTMENT Provider Note   CSN: [id number] Arrival date & time: 11/18/19  1231     History Chief Complaint  Patient presents with  . Post-op Problem    Janeece Blok is a 59 y.o. female.  Quetzal Meany is a 59 y.o. female with a history of hypertension, hyperlipidemia, diabetes, peripheral vascular disease, s/p recent femoropopliteal bypass on 8/24, who presents to the ED for evaluation of opening of wound on the left upper thigh.  She was discharged from the hospital yesterday and states at that point it was open and a small area at the top of the wound and she was instructed to continue to do wet-to-dry dressings in this area.  When her daughter came over today to help her shower and change her dressing they noticed that the wound head open significantly more and was now open all the way to the bottom of the small wound on the right upper thigh, she became very worried and decided to come in for evaluation, they had called Dr. Donzetta Matters, her vascular surgeon, but presented to the ED prior to hearing back from them.  Daughter noted some clear pink-brown drainage from the open wound, no bleeding or purulent drainage.  Patient denies any worsening pain.  The other wounds on her inner lower thigh and calf as well as at her groin have remained intact without surrounding erythema or any dehiscence or opening.  She denies any fevers or chills.  No numbness tingling or weakness in the leg, no discoloration.  No other aggravating or alleviating factors.        Past Medical History:  Diagnosis Date  . Allergy   . Barrett esophagus   . Cataract    left cataract removal  . Essential hypertension, benign 09/12/2013  . GERD (gastroesophageal reflux disease)   . Hyperlipidemia 09/12/2013  . 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 Peripheral vascular disease ([place])   . PONV (postoperative nausea and vomiting)   . Type 2 diabetes mellitus ([place]) 09/12/2013    Dr. Posey Pronto at [facility]     Patient Active Problem List   Diagnosis Date Noted  . Peripheral arterial occlusive disease ([place]) 11/12/2019  . Stress due to illness of family member 09/05/2018  . PAD (peripheral artery disease) [facility])-  See's Cards- Dr Elaina Pattee [place]/ that group for this condition 05/28/2018  . Foot pain, left 04/20/2018  . Trigger ring finger 04/20/2018  . Diabetic retinopathy associated with diabetes mellitus due to underlying condition ([place]) 04/20/2018  . 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 Diabetes mellitus due to underlying condition with stage 3 chronic kidney disease, without long-term current use of insulin ([place]) 07/12/2017  . Mixed diabetic hyperlipidemia associated with type 2 diabetes mellitus ([place]) 07/12/2017  . Hypertension associated with diabetes ([place]) 07/12/2017  . Obesity, Class III, morbid obesity  ([place]) 07/12/2017  . H/O medication noncompliance 07/12/2017  . Barrett's esophagus 03/11/2016  . 1st degree AV block 03/01/2016  . Tachycardia with hypertension 03/01/2016  . Dysphagia 02/10/2016  . Vulvar abscess   . Sepsis ([place]) 06/21/2015  . GERD (gastroesophageal reflux disease) 07/31/2014  . Adenomatous polyp of colon 04/03/2014  . Allergic rhinitis 01/23/2014  . CTS (carpal tunnel syndrome) 10/03/2013  . Uncontrolled type 2 diabetes mellitus with hyperglycemia ([place]) 10/03/2013  . Low back pain 10/03/2013  . Lumbar arthropathy 10/03/2013  . Lumbar degenerative disc disease 10/03/2013  . Neuropathy 10/03/2013  . Osteoarthritis 10/03/2013  . 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 Tenosynovitis  of foot 10/03/2013  . Currently controlled diabetes mellitus type 2 with complications ([place]) 82/99/3716  . Hyperlipidemia 09/12/2013  . Essential hypertension, benign 09/12/2013  . Diabetic peripheral neuropathy associated with type 2 diabetes mellitus ([place]) 09/12/2013    Past Surgical History:  Procedure Laterality Date  . ABDOMINAL  AORTOGRAM W/LOWER EXTREMITY N/A 06/06/2018   Procedure: ABDOMINAL AORTOGRAM W/LOWER EXTREMITY;  Surgeon: Wellington Hampshire, MD;  Location: [facility];  Service: Cardiovascular;  Laterality: N/A;  . ABDOMINAL AORTOGRAM W/LOWER EXTREMITY N/A 09/18/2019   Procedure: ABDOMINAL AORTOGRAM W/LOWER EXTREMITY;  Surgeon: Wellington Hampshire, MD;  Location: [facility];  Service: Cardiovascular;  Laterality: N/A;  . APPENDECTOMY    . CATARACT EXTRACTION Left   . CESAREAN SECTION  1990  . COLONOSCOPY    . FEMORAL-POPLITEAL BYPASS GRAFT Left 11/12/2019   Procedure: BYPASS GRAFT FEMORAL-ABOVE KNEE POPLITEAL ARTERY;  Surgeon: Waynetta Sandy, MD;  Location: [facility];  Service: Vascular;  Laterality: Left;  . right tube and ovary removed    . WISDOM TOOTH EXTRACTION       OB History    Gravida  4   Para  3   Term  2   Preterm  1   AB  1   Living  3     SAB  1   TAB  0   Ectopic  0   Multiple  0   Live Births  3           Family History  Problem Relation Age of Onset  . Lung cancer Mother   . Stroke Father   . Heart disease Maternal Aunt   . Fibromyalgia Sister   . Alzheimer's disease Maternal Grandmother   . Colon cancer Neg Hx   . Esophageal cancer Neg Hx   . Rectal cancer Neg Hx   . Stomach cancer Neg Hx   . Pancreatic cancer Neg Hx     Social History   Tobacco Use  . Smoking status: Former Smoker    Packs/day: 0.50    Quit date: 11/07/2000    Years since quitting: 19.0  . Smokeless tobacco: Never Used  . Tobacco comment: quit 30 plus years ago.  Vaping Use  . Vaping Use: Never used  Substance Use Topics  . Alcohol use: No    Alcohol/week: 0.0 standard drinks  . Drug use: No    Home Medications Prior to Admission medications   Medication Sig Start Date End Date Taking? Authorizing Provider  aspirin 81 MG chewable tablet Chew 81 mg by mouth daily.     [provider]  atorvastatin (LIPITOR) 40 MG tablet Take 1 tablet (40 mg  total) by mouth at bedtime. 09/10/19   Wellington Hampshire, MD  Cholecalciferol (VITAMIN D3) 50 MCG (2000 UT) TABS Take 2,000 Units by mouth daily.     [provider]  cilostazol (PLETAL) 50 MG tablet TAKE 1 TABLET BY MOUTH TWICE A DAY Patient taking differently: Take 50 mg by mouth 2 (two) times daily.  12/26/18   Wellington Hampshire, MD  Continuous Blood Gluc Sensor (FREESTYLE LIBRE 14 DAY SENSOR) MISC 1 each by Other route See admin instructions. Test daily as directed and change every 14 days. 11/11/19   [name], PA-C  insulin degludec (TRESIBA FLEXTOUCH) 100 UNIT/ML FlexTouch Pen INJECT 50 UNITS SUBCUTANEOUS AT BEDTIME 11/13/19   Abonza, Maritza, PA-C  metoprolol tartrate (LOPRESSOR) 50 MG tablet Take 1.5 tablets (75 mg total) by mouth  2 (two) times daily. Patient taking differently: Take 50 mg by mouth 2 (two) times daily.  12/18/18   Wellington Hampshire, MD  oxyCODONE-acetaminophen (PERCOCET/ROXICET) 5-325 MG tablet Take 1-2 tablets by mouth every 4 (four) hours as needed for moderate pain. 11/17/19   Baglia, Corrina, PA-C  Semaglutide,0.25 or 0.5MG /DOS, (OZEMPIC, 0.25 OR 0.5 MG/DOSE,) 2 MG/1.5ML SOPN Inject 0.25 mg [place] once weekly for 4 weeks. Then 0.5 mg [place] once weekly. 11/11/19   [name], PA-C    Allergies    Penicillins and Metformin and related  Review of Systems   Review of Systems  Constitutional: Negative for chills and fever.  Respiratory: Negative for shortness of breath.   Cardiovascular: Negative for chest pain.  Genitourinary: Negative for dysuria and frequency.  Musculoskeletal: Negative for arthralgias and myalgias.  Skin: Positive for wound.  Neurological: Negative for weakness and numbness.  All other systems reviewed and are negative.   Physical Exam Updated Vital Signs BP (!) 173/83 (BP Location: Right Arm)   Pulse 92   Temp 99.3 F (37.4 C) (Oral)   Resp 17   SpO2 100%   Physical Exam Vitals and nursing note reviewed.  Constitutional:       General: She is not in acute distress.    Appearance: Normal appearance. She is well-developed. She is obese. She is not ill-appearing or diaphoretic.     Comments: Well-appearing and in no distress  HENT:     Head: Normocephalic and atraumatic.  Eyes:     General:        Right eye: No discharge.        Left eye: No discharge.  Cardiovascular:     Rate and Rhythm: Normal rate and regular rhythm.     Pulses:          Dorsalis pedis pulses are detected w/ Doppler on the right side and detected w/ Doppler on the left side.       Posterior tibial pulses are detected w/ Doppler on the right side and detected w/ Doppler on the left side.     Heart sounds: Normal heart sounds.     Comments: Strong pulses detected with Doppler bilaterally Pulmonary:     Effort: Pulmonary effort is normal. No respiratory distress.     Comments: Respirations equal and unlabored, patient able to speak in full sentences, lungs clear to auscultation bilaterally Musculoskeletal:     Comments: Wound to the left upper thigh open and dehisced with small amount of clear pink drainage, no bleeding or purulence, subcutaneous tissue visible, no muscle. Wound to the inner lower left thigh and calf without dehiscence, or significant surrounding erythema, no drainage noted Wound to the left groin is intact as well. See photos below  Skin:    General: Skin is warm and dry.  Neurological:     Mental Status: She is alert and oriented to person, place, and time.     Coordination: Coordination normal.  Psychiatric:        Mood and Affect: Mood normal.        Behavior: Behavior normal.           ED Results / Procedures / Treatments   Labs (all labs ordered are listed, but only abnormal results are displayed) Labs Reviewed - No data to display  EKG None  Radiology No results found.  Procedures Procedures (including critical care time)  Medications Ordered in ED Medications - No data to display  ED Course  I have reviewed the triage vital signs and the nursing notes.  Pertinent labs & imaging results that were available during my care of the patient were reviewed by me and considered in my medical decision making (see chart for details).    MDM Rules/Calculators/A&P                         59 year old female presents with worsening wound dehiscence of left upper thigh wound after recent femoropopliteal bypass with Dr. Donzetta Matters on 8/24.  States prior to discharge she had a small area of this wound opening and had been instructed to use wet-to-dry dressings but today when her daughter went to help her change her dressing and noted that this had worsened.  On exam the left upper thigh wound is open along the entire length of the incision, subcutaneous tissue visible, no purulent drainage or bleeding.  Bypass graft is patent with strong dopplerable pulses distally.  Will consult vascular surgery.  Case discussed with Dr. Oneida Alar who has been down to see the patient, has recommended continued wet-to-dry dressings, no further intervention needed today, he discussed these recommendations and instructions with patient and family who expressed understanding and agreement.  At this time the patient is stable for discharge home with close follow-up with her vascular surgeon as planned.  Final Clinical Impression(s) / ED Diagnoses Final diagnoses:  Wound dehiscence    Rx / DC Orders ED Discharge Orders    None       Janet Berlin 11/18/19 2037    Carmin Muskrat, MD 11/19/19 2312

## 2019-11-18 NOTE — Telephone Encounter (Signed)
Pt's daughter called triage with c/o incision opening up. Returned the call and pt is in the ED. She will call us back if we can be of assistance.

## 2019-11-18 NOTE — Discharge Instructions (Signed)
Please continue with wet-to-dry dressings as directed by Dr. Oneida Alar today and follow-up with Dr. Donzetta Matters as planned.  If you note that the area is getting larger, or you note surrounding erythema, puslike drainage, increasing pain, please call your surgeon's office, or return to the ED if needed

## 2019-11-20 ENCOUNTER — Telehealth: Payer: Self-pay | Admitting: Physician Assistant

## 2019-11-20 MED ORDER — FLUCONAZOLE 150 MG PO TABS: 150.0000 mg | ORAL_TABLET | Freq: Once | ORAL | 0 refills | Status: AC

## 2019-11-20 NOTE — Telephone Encounter (Signed)
Patient called and states she has a mild yeast infection with no complications and is wondering if we can send in Diflucan for her to her [place]. Please advise

## 2019-11-20 NOTE — Telephone Encounter (Signed)
Ok to send in [place]?

## 2019-11-20 NOTE — Addendum Note (Signed)
Addended by: Mickel Crow on: 11/20/2019 04:41 PM   Modules accepted: Orders

## 2019-11-20 NOTE — Telephone Encounter (Signed)
Patient is aware med sent to pharmacy. AS< CMA

## 2019-12-06 ENCOUNTER — Other Ambulatory Visit: Payer: Self-pay

## 2019-12-06 ENCOUNTER — Ambulatory Visit (INDEPENDENT_AMBULATORY_CARE_PROVIDER_SITE_OTHER): Payer: Self-pay | Admitting: Vascular Surgery

## 2019-12-06 ENCOUNTER — Encounter: Payer: Self-pay | Admitting: Vascular Surgery

## 2019-12-06 VITALS — BP 165/89 | HR 90 | Temp 98.2°F | Resp 20 | Ht 64.0 in | Wt 214.8 lb

## 2019-12-06 DIAGNOSIS — I739 Peripheral vascular disease, unspecified: Secondary | ICD-10-CM

## 2019-12-06 NOTE — Progress Notes (Signed)
    Subjective:     Patient ID: Julie Prince, female   DOB: January 14, 1961, 59 y.o.   MRN: [id number]  HPI 59 year old female status post left common femoral to above-knee popliteal artery bypass with vein.  This was performed for gangrene of left small toe.  She still has gangrenous changes the left small toe does not want to have amputation is keeping it dry.  She also had a saphenectomy site wound which is healing well.  She is walking with the help of a walker states that she is ready to return to full activity.  She had initially mild to moderate leg swelling this is now trace at best.  She has no right lower extremity symptoms.   Review of Systems Left saphenectomy site healing well, left small toe gangrenous changes are stable    Objective:   Physical Exam Vitals:   12/06/19 0818  BP: (!) 165/89  Pulse: 90  Resp: 20  Temp: 98.2 F (36.8 C)  SpO2: 96%   Awake alert oriented Nonlabored respirations Left groin incision well-healed Left medial thigh saphenectomy site has granulated to the level of the skin Other incisions healing well Palpable anterior tibial pulse at the ankle very strong posterior tibial signal and dorsalis pedis signal with Doppler Left small toe with gangrenous changes     Assessment:     59 year old female status post left common femoral to above-knee popliteal artery bypass with vein for gangrenous changes of left small toe.  Patient continues to not want any further surgery particularly to amputation.  We have discussed the signs and symptoms of infection which would necessitate amputation at this time she is unwilling to proceed.    Plan:     Follow-up in 3 to 4 weeks for wound check If patient is still unwilling to proceed with amputation we will get her out to 9 months from surgery to check left lower extremity bypass graft duplex and ABIs If she needs further intervention on the right lower extremity this could be done with Dr. Fletcher Anon  endovascularly.  Rodel Glaspy C. Donzetta Matters, MD [facility] Office: [phone] Pager: [phone]

## 2019-12-09 ENCOUNTER — Other Ambulatory Visit: Payer: Self-pay | Admitting: Cardiovascular Disease

## 2019-12-17 ENCOUNTER — Ambulatory Visit (INDEPENDENT_AMBULATORY_CARE_PROVIDER_SITE_OTHER): Payer: 59 | Admitting: Cardiovascular Disease

## 2019-12-17 ENCOUNTER — Encounter: Payer: Self-pay | Admitting: Cardiovascular Disease

## 2019-12-17 ENCOUNTER — Other Ambulatory Visit: Payer: Self-pay

## 2019-12-17 VITALS — BP 122/70 | HR 90 | Ht 64.0 in | Wt 216.0 lb

## 2019-12-17 DIAGNOSIS — I739 Peripheral vascular disease, unspecified: Secondary | ICD-10-CM

## 2019-12-17 DIAGNOSIS — E785 Hyperlipidemia, unspecified: Secondary | ICD-10-CM

## 2019-12-17 DIAGNOSIS — I10 Essential (primary) hypertension: Secondary | ICD-10-CM | POA: Diagnosis not present

## 2019-12-17 MED ORDER — CILOSTAZOL 50 MG PO TABS
50.0000 mg | ORAL_TABLET | Freq: Two times a day (BID) | ORAL | 3 refills | Status: DC
Start: 2019-12-17 — End: 2020-12-28

## 2019-12-17 NOTE — Patient Instructions (Signed)
Medication Instructions:  Your physician recommends that you continue on your current medications as directed. Please refer to the Current Medication list given to you today.  *If you need a refill on your cardiac medications before your next appointment, please call your pharmacy*  Lab Work: NONE   Testing/Procedures: NONE  Follow-Up: At [facility], you and your health needs are our priority.  As part of our continuing mission to provide you with exceptional heart care, we have created designated Provider Care Teams.  These Care Teams include your primary Cardiologist (physician) and Advanced Practice Providers (APPs -  Physician Assistants and Nurse Practitioners) who all work together to provide you with the care you need, when you need it.  We recommend signing up for the patient portal called "MyChart".  Sign up information is provided on this After Visit Summary.  MyChart is used to connect with patients for Virtual Visits (Telemedicine).  Patients are able to view lab/test results, encounter notes, upcoming appointments, etc.  Non-urgent messages can be sent to your provider as well.   To learn more about what you can do with MyChart, go to [email or web address].    Your next appointment:   6 month(s)  The format for your next appointment:   In Person  Provider:   [name], MD

## 2019-12-17 NOTE — Progress Notes (Signed)
Cardiology Office Note   Date:  12/17/2019   ID:  [name] Verrilli, DOB 07/03/1960, MRN [id number]  PCP:  [name], PA-C  Cardiologist: Dr. Oval Linsey  No chief complaint on file.     History of Present Illness: Julie Prince is a 59 y.o. female who is here today for follow-up visit regarding peripheral arterial disease.   She has history of inappropriate sinus tachycardia, diabetes and hyperlipidemia. She is a previous smoker and quit more than 20 years ago. She has known history of peripheral arterial disease. Angiography in March of 2020 showed no significant aortoiliac disease, on the left, there was flush occlusion of the SFA with reconstitution via extensive collaterals from the profunda with two-vessel runoff below the knee.  On the right side, there was diffuse disease in the proximal portion of the SFA with total occlusion in the midsegment with reconstitution distally via extensive collaterals and one-vessel runoff below the knee.  She was started on small dose cilostazol.  At that time, she had no severe claudication or lower extremity ulceration and thus medical therapy was recommended.  However, she developed gangrenous left small toe and that repeat angiography was performed which showed similar findings.  She underwent left common femoral to above-knee popliteal artery bypass with vein by Dr. Donzetta Matters in August.  She reports resolution of left calf claudication.  She continues to have the wound on the left small toe which might require amputation but the patient prefers to continue with wound care for now. She denies right leg claudication.   Past Medical History:  Diagnosis Date  . Allergy   . Barrett esophagus   . Cataract    left cataract removal  . Essential hypertension, benign 09/12/2013  . GERD (gastroesophageal reflux disease)   . Hyperlipidemia 09/12/2013  . 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 Peripheral vascular disease ([place])   . PONV (postoperative  nausea and vomiting)   . Type 2 diabetes mellitus ([place]) 09/12/2013   Dr. Posey Pronto at [facility]     Past Surgical History:  Procedure Laterality Date  . ABDOMINAL AORTOGRAM W/LOWER EXTREMITY N/A 06/06/2018   Procedure: ABDOMINAL AORTOGRAM W/LOWER EXTREMITY;  Surgeon: Wellington Hampshire, MD;  Location: [place] [facility];  Service: Cardiovascular;  Laterality: N/A;  . ABDOMINAL AORTOGRAM W/LOWER EXTREMITY N/A 09/18/2019   Procedure: ABDOMINAL AORTOGRAM W/LOWER EXTREMITY;  Surgeon: Wellington Hampshire, MD;  Location: [facility];  Service: Cardiovascular;  Laterality: N/A;  . APPENDECTOMY    . CATARACT EXTRACTION Left   . CESAREAN SECTION  1990  . COLONOSCOPY    . FEMORAL-POPLITEAL BYPASS GRAFT Left 11/12/2019   Procedure: BYPASS GRAFT FEMORAL-ABOVE KNEE POPLITEAL ARTERY;  Surgeon: Waynetta Sandy, MD;  Location: [facility];  Service: Vascular;  Laterality: Left;  . right tube and ovary removed    . WISDOM TOOTH EXTRACTION       Current Outpatient Medications  Medication Sig Dispense Refill  . aspirin EC 81 MG tablet Take 81 mg by mouth in the morning. Swallow whole.    Marland Kitchen atorvastatin (LIPITOR) 40 MG tablet Take 1 tablet (40 mg total) by mouth at bedtime. 90 tablet 1  . Cholecalciferol (VITAMIN D3) 50 MCG (2000 UT) TABS Take 2,000 Units by mouth daily.     . cilostazol (PLETAL) 50 MG tablet Take 1 tablet (50 mg total) by mouth 2 (two) times daily. 180 tablet 3  . Continuous Blood Gluc Sensor (FREESTYLE LIBRE 14 DAY SENSOR) MISC 1 each by Other  route See admin instructions. Test daily as directed and change every 14 days. (Patient taking differently: Inject 1 each into the skin every 14 (fourteen) days. ) 1 each 3  . insulin degludec (TRESIBA FLEXTOUCH) 100 UNIT/ML FlexTouch Pen INJECT 50 UNITS SUBCUTANEOUS AT BEDTIME (Patient taking differently: Inject 50 Units into the skin at bedtime. ) 3 mL 1  . metoprolol tartrate (LOPRESSOR) 50 MG tablet Take 1.5 tablets (75 mg total) by  mouth 2 (two) times daily. (Patient taking differently: Take 50 mg by mouth 2 (two) times daily. ) 270 tablet 1  . oxyCODONE-acetaminophen (PERCOCET/ROXICET) 5-325 MG tablet Take 1-2 tablets by mouth every 4 (four) hours as needed for moderate pain. 30 tablet 0  . Semaglutide,0.25 or 0.5MG /DOS, (OZEMPIC, 0.25 OR 0.5 MG/DOSE,) 2 MG/1.5ML SOPN Inject 0.25 mg [place] once weekly for 4 weeks. Then 0.5 mg [place] once weekly. (Patient taking differently: Inject 0.25-0.5 mg into the skin See admin instructions. Inject 0.25 mg into the skin once weekly for 4 weeks, then increase to 0.5 mg once weekly) 1.5 mL 3   Current Facility-Administered Medications  Medication Dose Route Frequency Provider Last Rate Last Admin  . 0.9 %  sodium chloride infusion  500 mL Intravenous Once Pyrtle, Lajuan Lines, MD        Allergies:   Penicillins and Metformin and related    Social History:  The patient  reports that she quit smoking about 19 years ago. She smoked 0.50 packs per day. She has never used smokeless tobacco. She reports that she does not drink alcohol and does not use drugs.   Family History:  The patient's family history includes Alzheimer's disease in her maternal grandmother; Fibromyalgia in her sister; Heart disease in her maternal aunt; Lung cancer in her mother; Stroke in her father.    ROS:  Please see the history of present illness.   Otherwise, review of systems are positive for none.   All other systems are reviewed and negative.    PHYSICAL EXAM: VS:  BP 122/70   Pulse 90   Ht 5\' 4"  (1.626 m)   Wt 216 lb (98 kg)   SpO2 97%   BMI 37.08 kg/m  , BMI Body mass index is 37.08 kg/m. GEN: Well nourished, well developed, in no acute distress  HEENT: normal  Neck: no JVD, carotid bruits, or masses Cardiac: RRR; no murmurs, rubs, or gallops,no edema  Respiratory:  clear to auscultation bilaterally, normal work of breathing GI: soft, nontender, nondistended, + BS MS: no deformity or atrophy  Skin: warm and  dry, no rash Neuro:  Strength and sensation are intact Psych: euthymic mood, full affect Vascular: Distal pulses are not palpable.  Gangrenous left small toe.  I have probably eat that now is not left third good thank you     EKG:  EKG is not ordered today.  Recent Labs: 12/26/2018: TSH 1.790 11/08/2019: ALT 10 11/16/2019: BUN <5; Creatinine, Ser 0.82; Hemoglobin 8.0; Platelets 263; Potassium 3.2; Sodium 141    Lipid Panel    Component Value Date/Time   CHOL 104 11/13/2019 0138   CHOL 208 (H) 12/26/2018 0941   TRIG 88 11/13/2019 0138   HDL 26 (L) 11/13/2019 0138   HDL 46 12/26/2018 0941   CHOLHDL 4.0 11/13/2019 0138   VLDL 18 11/13/2019 0138   LDLCALC 60 11/13/2019 0138   LDLCALC 145 (H) 12/26/2018 0941      Wt Readings from Last 3 Encounters:  12/17/19 216 lb (98 kg)  12/06/19  214 lb 12.8 oz (97.4 kg)  11/16/19 211 lb 15.9 oz (96.2 kg)        PAD Screen 05/22/2018  Previous PAD dx? No  Previous surgical procedure? No  Pain with walking? Yes  Subsides with rest? No  Feet/toe relief with dangling? No  Painful, non-healing ulcers? No  Extremities discolored? No      ASSESSMENT AND PLAN:  1.  Peripheral arterial disease: Status post recent left common femoral to above-knee popliteal bypass using vein for gangrenous left small toe.  She seems to be healing nicely.  She might require amputation of the left small toe but she wants to continue with wound care for now.  I refilled her cilostazol and encouraged her to walk at least 30 minutes daily.  She does have significant disease affecting the right SFA but currently with no symptoms on the right side and thus we will continue medical therapy.  2.  Essential hypertension: Continue metoprolol and losartan  3.  Hyperlipidemia: Continue treatment with atorvastatin with a target LDL of less than 70.    Recent lipid profile showed an LDL of 60.  4.  Diabetes mellitus: Most recent hemoglobin A1c improved significantly to  5.8 from 10.  5.  Inappropriate sinus tachycardia versus ectopic atrial tachycardia: Improved with metoprolol and currently she takes 50 mg twice daily.   Disposition:   FU with me in 6 months  Signed,  [name], MD  12/17/2019 10:48 AM    [place]

## 2020-01-03 ENCOUNTER — Ambulatory Visit: Payer: 59

## 2020-01-10 ENCOUNTER — Ambulatory Visit (INDEPENDENT_AMBULATORY_CARE_PROVIDER_SITE_OTHER): Payer: Self-pay | Admitting: Physician Assistant

## 2020-01-10 ENCOUNTER — Other Ambulatory Visit: Payer: Self-pay

## 2020-01-10 VITALS — BP 142/77 | HR 95 | Temp 98.0°F | Resp 20 | Ht 64.0 in | Wt 219.5 lb

## 2020-01-10 DIAGNOSIS — L97529 Non-pressure chronic ulcer of other part of left foot with unspecified severity: Secondary | ICD-10-CM

## 2020-01-10 DIAGNOSIS — I739 Peripheral vascular disease, unspecified: Secondary | ICD-10-CM

## 2020-01-10 MED ORDER — SULFAMETHOXAZOLE-TRIMETHOPRIM 400-80 MG PO TABS: 1.0000 | ORAL_TABLET | Freq: Two times a day (BID) | ORAL | 0 refills | Status: DC

## 2020-01-10 NOTE — H&P (View-Only) (Signed)
Established Previous Bypass   History of Present Illness   Julie Prince is a 59 y.o. (10-04-1960) female who presents with chief complaint: infected left 5th toe.  She is status post left common femoral to above-the-knee popliteal artery bypass with vein by Dr. Donzetta Matters on 11/12/2019.  This was performed due to left fifth toe ulcer.  Saphenectomy incision of proximal thigh had some dehiscence postoperatively however she states all of her incisions are well-healed.  She is walking about 30 minutes a day and denies any claudication symptoms of bilateral lower extremities.  She was last seen by Dr. Donzetta Matters about 1 month ago and was refusing to have left fifth toe amputated.  She states she has thought more about it and is willing to undergo left fifth toe amputation now.  She also states she has purulent drainage from the left fifth toe ulceration and is worried about infection.  She denies any fevers, chills, nausea/vomiting.  She is taking an aspirin and statin daily.  She is also followed by Dr. Fletcher Anon for PAD.  She has known multilevel disease of right lower extremity however denies claudication, rest pain, and tissue changes.   Current Outpatient Medications  Medication Sig Dispense Refill  . aspirin EC 81 MG tablet Take 81 mg by mouth in the morning. Swallow whole.    Marland Kitchen atorvastatin (LIPITOR) 40 MG tablet Take 1 tablet (40 mg total) by mouth at bedtime. 90 tablet 1  . Cholecalciferol (VITAMIN D3) 50 MCG (2000 UT) TABS Take 2,000 Units by mouth daily.     . cilostazol (PLETAL) 50 MG tablet Take 1 tablet (50 mg total) by mouth 2 (two) times daily. 180 tablet 3  . Continuous Blood Gluc Sensor (FREESTYLE LIBRE 14 DAY SENSOR) MISC 1 each by Other route See admin instructions. Test daily as directed and change every 14 days. (Patient taking differently: Inject 1 each into the skin every 14 (fourteen) days. ) 1 each 3  . insulin degludec (TRESIBA FLEXTOUCH) 100 UNIT/ML FlexTouch Pen INJECT 50 UNITS  SUBCUTANEOUS AT BEDTIME (Patient taking differently: Inject 50 Units into the skin at bedtime. ) 3 mL 1  . metoprolol tartrate (LOPRESSOR) 50 MG tablet Take 1.5 tablets (75 mg total) by mouth 2 (two) times daily. (Patient taking differently: Take 50 mg by mouth 2 (two) times daily. ) 270 tablet 1  . oxyCODONE-acetaminophen (PERCOCET/ROXICET) 5-325 MG tablet Take 1-2 tablets by mouth every 4 (four) hours as needed for moderate pain. 30 tablet 0  . Semaglutide,0.25 or 0.5MG /DOS, (OZEMPIC, 0.25 OR 0.5 MG/DOSE,) 2 MG/1.5ML SOPN Inject 0.25 mg [place] once weekly for 4 weeks. Then 0.5 mg [place] once weekly. (Patient taking differently: Inject 0.25-0.5 mg into the skin See admin instructions. Inject 0.25 mg into the skin once weekly for 4 weeks, then increase to 0.5 mg once weekly) 1.5 mL 3  . sulfamethoxazole-trimethoprim (BACTRIM) 400-80 MG tablet Take 1 tablet by mouth 2 (two) times daily. 20 tablet 0   Current Facility-Administered Medications  Medication Dose Route Frequency Provider Last Rate Last Admin  . 0.9 %  sodium chloride infusion  500 mL Intravenous Once Pyrtle, Lajuan Lines, MD        REVIEW OF SYSTEMS (negative unless checked):   Cardiac:  []  Chest pain or chest pressure? []  Shortness of breath upon activity? []  Shortness of breath when lying flat? []  Irregular heart rhythm?  Vascular:  []  Pain in calf, thigh, or hip brought on by walking? []  Pain in feet at night  that wakes you up from your sleep? []  Blood clot in your veins? []  Leg swelling?  Pulmonary:  []  Oxygen at home? []  Productive cough? []  Wheezing?  Neurologic:  []  Sudden weakness in arms or legs? []  Sudden numbness in arms or legs? []  Sudden onset of difficult speaking or slurred speech? []  Temporary loss of vision in one eye? []  Problems with dizziness?  Gastrointestinal:  []  Blood in stool? []  Vomited blood?  Genitourinary:  []  Burning when urinating? []  Blood in urine?  Psychiatric:  []  Major  depression  Hematologic:  []  Bleeding problems? []  Problems with blood clotting?  Dermatologic:  []  Rashes or ulcers?  Constitutional:  []  Fever or chills?  Ear/Nose/Throat:  []  Change in hearing? []  Nose bleeds? []  Sore throat?  Musculoskeletal:  []  Back pain? []  Joint pain? []  Muscle pain?   Physical Examination   Vitals:   01/10/20 0950  BP: (!) 142/77  Pulse: 95  Resp: 20  Temp: 98 F (36.7 C)  TempSrc: Temporal  SpO2: 99%  Weight: 219 lb 8 oz (99.6 kg)  Height: 5\' 4"  (1.626 m)   Body mass index is 37.68 kg/m.  General:  WDWN in NAD; vital signs documented above Gait: Not observed HENT: WNL, normocephalic Pulmonary: normal non-labored breathing Cardiac: regular HR Abdomen: soft, NT, no masses Skin: without rashes Vascular Exam/Pulses:brisk L DP doppler signal; R foot warm with good cap refill; no palpable pulses R foot Extremities: purulent drainage from L 5th toe ulcer Musculoskeletal: no muscle wasting or atrophy  Neurologic: A&O X 3;  No focal weakness or paresthesias are detected Psychiatric:  The pt has Normal affect.    Medical Decision Making   Julie Prince is a 59 y.o. female who presents s/p L femoral to AK popliteal bypass with vein and infected L 5th toe   Patent L fem-AK pop with brisk pedal doppler signals  Patient previously had been refusing further surgery.  She is now willing to proceed with L 5th toe amputation  5th toe is also now edematous and erythematous with purulent drainage from ulcer; I have written for Bactrim to bridge patient to surgery  Dr. Donzetta Matters is off next week however I believe amputation should be performed sooner rather than later based on the appearance and purulent drainage.  She will be scheduled next week with one of Dr. Claretha Cooper partners.  Patient is willing to proceed  Julie Ligas PA-C [facility] Office: [phone]  Clinic MD: Donzetta Matters

## 2020-01-10 NOTE — Progress Notes (Signed)
Established Previous Bypass   History of Present Illness   Julie Prince is a 59 y.o. (November 22, 1960) female who presents with chief complaint: infected left 5th toe.  She is status post left common femoral to above-the-knee popliteal artery bypass with vein by Dr. Donzetta Matters on 11/12/2019.  This was performed due to left fifth toe ulcer.  Saphenectomy incision of proximal thigh had some dehiscence postoperatively however she states all of her incisions are well-healed.  She is walking about 30 minutes a day and denies any claudication symptoms of bilateral lower extremities.  She was last seen by Dr. Donzetta Matters about 1 month ago and was refusing to have left fifth toe amputated.  She states she has thought more about it and is willing to undergo left fifth toe amputation now.  She also states she has purulent drainage from the left fifth toe ulceration and is worried about infection.  She denies any fevers, chills, nausea/vomiting.  She is taking an aspirin and statin daily.  She is also followed by Dr. Fletcher Anon for PAD.  She has known multilevel disease of right lower extremity however denies claudication, rest pain, and tissue changes.   Current Outpatient Medications  Medication Sig Dispense Refill  . aspirin EC 81 MG tablet Take 81 mg by mouth in the morning. Swallow whole.    Marland Kitchen atorvastatin (LIPITOR) 40 MG tablet Take 1 tablet (40 mg total) by mouth at bedtime. 90 tablet 1  . Cholecalciferol (VITAMIN D3) 50 MCG (2000 UT) TABS Take 2,000 Units by mouth daily.     . cilostazol (PLETAL) 50 MG tablet Take 1 tablet (50 mg total) by mouth 2 (two) times daily. 180 tablet 3  . Continuous Blood Gluc Sensor (FREESTYLE LIBRE 14 DAY SENSOR) MISC 1 each by Other route See admin instructions. Test daily as directed and change every 14 days. (Patient taking differently: Inject 1 each into the skin every 14 (fourteen) days. ) 1 each 3  . insulin degludec (TRESIBA FLEXTOUCH) 100 UNIT/ML FlexTouch Pen INJECT 50 UNITS  SUBCUTANEOUS AT BEDTIME (Patient taking differently: Inject 50 Units into the skin at bedtime. ) 3 mL 1  . metoprolol tartrate (LOPRESSOR) 50 MG tablet Take 1.5 tablets (75 mg total) by mouth 2 (two) times daily. (Patient taking differently: Take 50 mg by mouth 2 (two) times daily. ) 270 tablet 1  . oxyCODONE-acetaminophen (PERCOCET/ROXICET) 5-325 MG tablet Take 1-2 tablets by mouth every 4 (four) hours as needed for moderate pain. 30 tablet 0  . Semaglutide,0.25 or 0.5MG /DOS, (OZEMPIC, 0.25 OR 0.5 MG/DOSE,) 2 MG/1.5ML SOPN Inject 0.25 mg [place] once weekly for 4 weeks. Then 0.5 mg [place] once weekly. (Patient taking differently: Inject 0.25-0.5 mg into the skin See admin instructions. Inject 0.25 mg into the skin once weekly for 4 weeks, then increase to 0.5 mg once weekly) 1.5 mL 3  . sulfamethoxazole-trimethoprim (BACTRIM) 400-80 MG tablet Take 1 tablet by mouth 2 (two) times daily. 20 tablet 0   Current Facility-Administered Medications  Medication Dose Route Frequency Provider Last Rate Last Admin  . 0.9 %  sodium chloride infusion  500 mL Intravenous Once Pyrtle, Lajuan Lines, MD        REVIEW OF SYSTEMS (negative unless checked):   Cardiac:  []  Chest pain or chest pressure? []  Shortness of breath upon activity? []  Shortness of breath when lying flat? []  Irregular heart rhythm?  Vascular:  []  Pain in calf, thigh, or hip brought on by walking? []  Pain in feet at night  that wakes you up from your sleep? []  Blood clot in your veins? []  Leg swelling?  Pulmonary:  []  Oxygen at home? []  Productive cough? []  Wheezing?  Neurologic:  []  Sudden weakness in arms or legs? []  Sudden numbness in arms or legs? []  Sudden onset of difficult speaking or slurred speech? []  Temporary loss of vision in one eye? []  Problems with dizziness?  Gastrointestinal:  []  Blood in stool? []  Vomited blood?  Genitourinary:  []  Burning when urinating? []  Blood in urine?  Psychiatric:  []  Major  depression  Hematologic:  []  Bleeding problems? []  Problems with blood clotting?  Dermatologic:  []  Rashes or ulcers?  Constitutional:  []  Fever or chills?  Ear/Nose/Throat:  []  Change in hearing? []  Nose bleeds? []  Sore throat?  Musculoskeletal:  []  Back pain? []  Joint pain? []  Muscle pain?   Physical Examination   Vitals:   01/10/20 0950  BP: (!) 142/77  Pulse: 95  Resp: 20  Temp: 98 F (36.7 C)  TempSrc: Temporal  SpO2: 99%  Weight: 219 lb 8 oz (99.6 kg)  Height: 5\' 4"  (1.626 m)   Body mass index is 37.68 kg/m.  General:  WDWN in NAD; vital signs documented above Gait: Not observed HENT: WNL, normocephalic Pulmonary: normal non-labored breathing Cardiac: regular HR Abdomen: soft, NT, no masses Skin: without rashes Vascular Exam/Pulses:brisk L DP doppler signal; R foot warm with good cap refill; no palpable pulses R foot Extremities: purulent drainage from L 5th toe ulcer Musculoskeletal: no muscle wasting or atrophy  Neurologic: A&O X 3;  No focal weakness or paresthesias are detected Psychiatric:  The pt has Normal affect.    Medical Decision Making   Julie Prince is a 59 y.o. female who presents s/p L femoral to AK popliteal bypass with vein and infected L 5th toe   Patent L fem-AK pop with brisk pedal doppler signals  Patient previously had been refusing further surgery.  She is now willing to proceed with L 5th toe amputation  5th toe is also now edematous and erythematous with purulent drainage from ulcer; I have written for Bactrim to bridge patient to surgery  Dr. Donzetta Matters is off next week however I believe amputation should be performed sooner rather than later based on the appearance and purulent drainage.  She will be scheduled next week with one of Dr. Claretha Cooper partners.  Patient is willing to proceed  Dagoberto Ligas PA-C [facility] [place] Office: [phone]  Clinic MD: Donzetta Matters

## 2020-01-17 ENCOUNTER — Ambulatory Visit: Payer: 59

## 2020-01-20 ENCOUNTER — Other Ambulatory Visit (HOSPITAL_COMMUNITY)
Admission: RE | Admit: 2020-01-20 | Discharge: 2020-01-20 | Disposition: A | Discharge status: Home / Self Care | Payer: 59 | Source: Ambulatory Visit | Attending: Vascular Surgery | Admitting: Vascular Surgery

## 2020-01-20 DIAGNOSIS — Z01812 Encounter for preprocedural laboratory examination: Secondary | ICD-10-CM | POA: Diagnosis present

## 2020-01-20 DIAGNOSIS — Z20822 Contact with and (suspected) exposure to covid-19: Secondary | ICD-10-CM | POA: Insufficient documentation

## 2020-01-20 LAB — SARS CORONAVIRUS 2 (TAT 6-24 HRS): SARS Coronavirus 2: NEGATIVE

## 2020-01-22 ENCOUNTER — Other Ambulatory Visit: Payer: Self-pay

## 2020-01-22 ENCOUNTER — Encounter (HOSPITAL_COMMUNITY): Payer: Self-pay | Admitting: Vascular Surgery

## 2020-01-22 NOTE — Progress Notes (Signed)
Anesthesia Chart Review: Same day workup  Patient follows with cardiology for history of peripheral artery disease, inappropriate sinus tachycardia, hyperlipidemia, hypertension.  Recently seen by Dr. Fletcher Anon for eval of nonhealing ulceration on left small toe was found to have significant peripheral vascular disease and was referred to vascular surgery for left femoropopliteal bypass.  He provided cardiac clearance in note 10/15/2019, "Preoperative cardiovascular evaluation for anticipated vascular surgery:The patient has no anginal symptoms and has good functional capacity. Recent EKG in June showed no ischemic findings. The patient does not require ischemic cardiac evaluation before surgery and she should be considered at an overall low risk."  Pt subsequently underwent left fempop bypass 7/41/28 without complication. She subsequently followed up with Dr. Fletcher Anon 12/17/19, noted to be recovering well from surgery, however also discussed that she may ultimately require amputation of left small toe. No changes were made to her medical therapy and she was advised to followup in 6 months.   History of uncontrolled IDDM 2.  Last A1c 13.5 on 11/08/19. More recent CBGs indicate better control.   Will need DOS labs and evaluation.   EKG 09/10/2019: Sinus tach with first-degree AV block.  Rate 110.  LAD.  Low voltage QRS.  Inferior infarct, age undetermined.  Cannot rule out anterior infarct, age undetermined.  TTE 04/18/2018: 1. The left ventricle appears to be normal in size, have normal wall  thickness, with normal systolic function of 78-67%. Echo evidence of  normal in diastolic filling patterns.  2. Right ventricular systolic pressure is could not be assessed.  3. The right ventricle is normal in size, has normal wall thickness and  normal systolic function.  4. Normal left atrial size.  5. Normal right atrial size.  6. Mild mitral annular calcification.  7. The mitral valve normal in  structure and function.  8. Normal tricuspid valve.  9. Aortic valve normal.  10. The ascending aorta and aortic rootare normal is size and structure.  11. No atrial level shunt detected by color flow Doppler.    Wynonia Musty [facility] Short Stay [facility]/Anesthesiology Phone [phone] 01/22/2020 11:21 AM

## 2020-01-22 NOTE — Anesthesia Preprocedure Evaluation (Addendum)
Anesthesia Evaluation  Patient identified by MRN, date of birth, ID band Patient awake    Reviewed: Allergy & Precautions, NPO status , Patient's Chart, lab work & pertinent test results  History of Anesthesia Complications (+) PONV and history of anesthetic complications  Airway Mallampati: II  TM Distance: >3 FB Neck ROM: Full    Dental  (+) Upper Dentures   Pulmonary former smoker,    Pulmonary exam normal breath sounds clear to auscultation       Cardiovascular hypertension, Pt. on home beta blockers + Peripheral Vascular Disease  Normal cardiovascular exam+ dysrhythmias  Rhythm:Regular Rate:Normal     Neuro/Psych  Neuromuscular disease    GI/Hepatic Neg liver ROS, GERD  ,  Endo/Other  diabetes, Type 2, Insulin DependentObesity   Renal/GU Renal InsufficiencyRenal disease     Musculoskeletal  (+) Arthritis ,   Abdominal   Peds  Hematology negative hematology ROS (+)   Anesthesia Other Findings Day of surgery medications reviewed with the patient.  Reproductive/Obstetrics                            Anesthesia Physical Anesthesia Plan  ASA: III  Anesthesia Plan: Regional   Post-op Pain Management:    Induction: Intravenous  PONV Risk Score and Plan: 3 and Propofol infusion, Treatment may vary due to age or medical condition, Dexamethasone, Ondansetron and Midazolam  Airway Management Planned: Natural Airway and Nasal Cannula  Additional Equipment:   Intra-op Plan:   Post-operative Plan:   Informed Consent: I have reviewed the patients History and Physical, chart, labs and discussed the procedure including the risks, benefits and alternatives for the proposed anesthesia with the patient or authorized representative who has indicated his/her understanding and acceptance.       Plan Discussed with: CRNA  Anesthesia Plan Comments: (PAT note by Karoline Caldwell, PA-C: Patient  follows with cardiology for history of peripheral artery disease, inappropriate sinus tachycardia, hyperlipidemia, hypertension.  Recently seen by Dr. Fletcher Anon for eval of nonhealing ulceration on left small toe was found to have significant peripheral vascular disease and was referred to vascular surgery for left femoropopliteal bypass.  He provided cardiac clearance in note 10/15/2019, "Preoperative cardiovascular evaluation for anticipated vascular surgery:The patient has no anginal symptoms and has good functional capacity. Recent EKG in June showed no ischemic findings. The patient does not require ischemic cardiac evaluation before surgery and she should be considered at an overall low risk."  Pt subsequently underwent left fempop bypass 8/58/85 without complication. She subsequently followed up with Dr. Fletcher Anon 12/17/19, noted to be recovering well from surgery, however also discussed that she may ultimately require amputation of left small toe. No changes were made to her medical therapy and she was advised to followup in 6 months.   History of uncontrolled IDDM 2.  Last A1c 13.5 on 11/08/19. More recent CBGs indicate better control.   Will need DOS labs and evaluation.   EKG 09/10/2019: Sinus tach with first-degree AV block.  Rate 110.  LAD.  Low voltage QRS.  Inferior infarct, age undetermined.  Cannot rule out anterior infarct, age undetermined.  TTE 04/18/2018: 1. The left ventricle appears to be normal in size, have normal wall  thickness, with normal systolic function of 02-77%. Echo evidence of  normal in diastolic filling patterns.  2. Right ventricular systolic pressure is could not be assessed.  3. The right ventricle is normal in size, has normal wall thickness and  normal systolic function.  4. Normal left atrial size.  5. Normal right atrial size.  6. Mild mitral annular calcification.  7. The mitral valve normal in structure and function.  8. Normal tricuspid valve.   9. Aortic valve normal.  10. The ascending aorta and aortic rootare normal is size and structure.  11. No atrial level shunt detected by color flow Doppler.   )       Anesthesia Quick Evaluation

## 2020-01-22 NOTE — Progress Notes (Signed)
Patient denies shortness of breath, fever, cough or chest pain.  PCP - [name], PA-C Cardiologist - Dr [name]  Chest x-ray - n/a EKG - 09/10/19 Stress Test - > 5 yrs ECHO - 04/18/18 Cardiac Cath - n/a  Fasting Blood Sugar - 60-80s Checks Blood Sugar multiple times a day- [employer]  . THE NIGHT BEFORE SURGERY, take 25 units of Tresiba Insulin.    . If your blood sugar is less than 70 mg/dL, you will need to treat for low blood sugar: o Treat a low blood sugar (less than 70 mg/dL) with  cup of clear juice (cranberry or apple), 4 glucose tablets, OR glucose gel. o Recheck blood sugar in 15 minutes after treatment (to make sure it is greater than 70 mg/dL). If your blood sugar is not greater than 70 mg/dL on recheck, call [phone] for further instructions.  Anesthesia review: Yes  STOP now taking any Aspirin (unless otherwise instructed by your surgeon), Aleve, Naproxen, Ibuprofen, Motrin, Advil, Goody's, BC's, all herbal medications, fish oil, and all vitamins.   Coronavirus Screening Covid test on 01/20/20 was negative.  Patient verbalized understanding of instructions that were given via phone.

## 2020-01-23 ENCOUNTER — Ambulatory Visit (HOSPITAL_COMMUNITY)
Admission: RE | Admit: 2020-01-23 | Discharge: 2020-01-23 | Disposition: A | Discharge status: Home / Self Care | Payer: 59 | Attending: Vascular Surgery | Admitting: Vascular Surgery

## 2020-01-23 ENCOUNTER — Encounter (HOSPITAL_COMMUNITY): Payer: Self-pay | Admitting: Vascular Surgery

## 2020-01-23 ENCOUNTER — Ambulatory Visit (HOSPITAL_COMMUNITY): Payer: 59 | Admitting: Vascular Surgery

## 2020-01-23 ENCOUNTER — Encounter (HOSPITAL_COMMUNITY)
Admission: RE | Disposition: A | Discharge status: Home / Self Care | Payer: Self-pay | Source: Home / Self Care | Attending: Vascular Surgery

## 2020-01-23 DIAGNOSIS — L97529 Non-pressure chronic ulcer of other part of left foot with unspecified severity: Secondary | ICD-10-CM | POA: Insufficient documentation

## 2020-01-23 DIAGNOSIS — Z79899 Other long term (current) drug therapy: Secondary | ICD-10-CM | POA: Insufficient documentation

## 2020-01-23 DIAGNOSIS — E1152 Type 2 diabetes mellitus with diabetic peripheral angiopathy with gangrene: Secondary | ICD-10-CM | POA: Insufficient documentation

## 2020-01-23 DIAGNOSIS — Z87891 Personal history of nicotine dependence: Secondary | ICD-10-CM | POA: Diagnosis not present

## 2020-01-23 DIAGNOSIS — E11621 Type 2 diabetes mellitus with foot ulcer: Secondary | ICD-10-CM | POA: Diagnosis not present

## 2020-01-23 DIAGNOSIS — Z794 Long term (current) use of insulin: Secondary | ICD-10-CM | POA: Diagnosis not present

## 2020-01-23 DIAGNOSIS — Z7982 Long term (current) use of aspirin: Secondary | ICD-10-CM | POA: Insufficient documentation

## 2020-01-23 DIAGNOSIS — Z9889 Other specified postprocedural states: Secondary | ICD-10-CM | POA: Diagnosis not present

## 2020-01-23 DIAGNOSIS — I10 Essential (primary) hypertension: Secondary | ICD-10-CM | POA: Insufficient documentation

## 2020-01-23 DIAGNOSIS — I96 Gangrene, not elsewhere classified: Secondary | ICD-10-CM | POA: Insufficient documentation

## 2020-01-23 HISTORY — PX: AMPUTATION: SHX166

## 2020-01-23 HISTORY — DX: Presence of dental prosthetic device (complete) (partial): Z97.2

## 2020-01-23 LAB — GLUCOSE, CAPILLARY
Glucose-Capillary: 88 mg/dL (ref 70–99)
Glucose-Capillary: 106 mg/dL — ABNORMAL HIGH (ref 70–99)
Glucose-Capillary: 120 mg/dL — ABNORMAL HIGH (ref 70–99)
Glucose-Capillary: 102 mg/dL — ABNORMAL HIGH (ref 70–99)

## 2020-01-23 LAB — BASIC METABOLIC PANEL
Anion gap: 10 (ref 5–15)
BUN: 12 mg/dL (ref 6–20)
CO2: 26 mmol/L (ref 22–32)
Calcium: 9.7 mg/dL (ref 8.9–10.3)
Chloride: 105 mmol/L (ref 98–111)
Creatinine, Ser: 0.96 mg/dL (ref 0.44–1.00)
GFR, Estimated: >60 mL/min (ref >60)
Glucose, Bld: 91 mg/dL (ref 70–99)
Potassium: 4 mmol/L (ref 3.5–5.1)
Sodium: 141 mmol/L (ref 135–145)

## 2020-01-23 LAB — CBC
HCT: 42.4 % (ref 36.0–46.0)
Hemoglobin: 13.6 g/dL (ref 12.0–15.0)
MCH: 30.6 pg (ref 26.0–34.0)
MCHC: 32.1 g/dL (ref 30.0–36.0)
MCV: 95.5 fL (ref 80.0–100.0)
Platelets: 271 10*3/uL (ref 150–400)
RBC: 4.44 MIL/uL (ref 3.87–5.11)
RDW: 13.6 % (ref 11.5–15.5)
WBC: 6.8 10*3/uL (ref 4.0–10.5)
nRBC: 0 % (ref 0.0–0.2)

## 2020-01-23 SURGERY — AMPUTATION DIGIT
Anesthesia: Regional | Site: Foot | Laterality: Left

## 2020-01-23 MED ORDER — OXYCODONE-ACETAMINOPHEN 5-325 MG PO TABS: 1.0000 | ORAL_TABLET | Freq: Four times a day (QID) | ORAL | 0 refills | Status: DC | PRN

## 2020-01-23 MED ORDER — CHLORHEXIDINE GLUCONATE 4 % EX LIQD: 60.0000 mL | Freq: Once | CUTANEOUS | Status: DC

## 2020-01-23 MED ORDER — CHLORHEXIDINE GLUCONATE 0.12 % MT SOLN
OROMUCOSAL | Status: AC
  Administered 2020-01-23: 15 mL via OROMUCOSAL
  Filled 2020-01-23: qty 15

## 2020-01-23 MED ORDER — CLONIDINE HCL (ANALGESIA) 100 MCG/ML EP SOLN
EPIDURAL | Status: DC | PRN
  Administered 2020-01-23: 50 ug

## 2020-01-23 MED ORDER — MIDAZOLAM HCL 2 MG/2ML IJ SOLN: 2.0000 mg | Freq: Once | INTRAMUSCULAR | Status: AC

## 2020-01-23 MED ORDER — PROMETHAZINE HCL 25 MG/ML IJ SOLN: 6.2500 mg | INTRAMUSCULAR | Status: DC | PRN

## 2020-01-23 MED ORDER — LIDOCAINE 2% (20 MG/ML) 5 ML SYRINGE
INTRAMUSCULAR | Status: DC | PRN
  Administered 2020-01-23: 40 mg via INTRAVENOUS

## 2020-01-23 MED ORDER — 0.9 % SODIUM CHLORIDE (POUR BTL) OPTIME
TOPICAL | Status: DC | PRN
  Administered 2020-01-23: 1000 mL

## 2020-01-23 MED ORDER — LIDOCAINE HCL (PF) 1 % IJ SOLN
INTRAMUSCULAR | Status: AC
  Filled 2020-01-23: qty 30

## 2020-01-23 MED ORDER — CHLORHEXIDINE GLUCONATE 0.12 % MT SOLN: 15.0000 mL | Freq: Once | OROMUCOSAL | Status: AC

## 2020-01-23 MED ORDER — MIDAZOLAM HCL 2 MG/2ML IJ SOLN
INTRAMUSCULAR | Status: AC
  Administered 2020-01-23: 2 mg via INTRAVENOUS
  Filled 2020-01-23: qty 2

## 2020-01-23 MED ORDER — FENTANYL CITRATE (PF) 250 MCG/5ML IJ SOLN
INTRAMUSCULAR | Status: AC
  Filled 2020-01-23: qty 5

## 2020-01-23 MED ORDER — MIDAZOLAM HCL 5 MG/5ML IJ SOLN
INTRAMUSCULAR | Status: DC | PRN
  Administered 2020-01-23: 2 mg via INTRAVENOUS

## 2020-01-23 MED ORDER — FENTANYL CITRATE (PF) 250 MCG/5ML IJ SOLN
INTRAMUSCULAR | Status: DC | PRN
Start: 2020-01-23 — End: 2020-01-23
  Administered 2020-01-23: 50 ug via INTRAVENOUS

## 2020-01-23 MED ORDER — VANCOMYCIN HCL IN DEXTROSE 1-5 GM/200ML-% IV SOLN
INTRAVENOUS | Status: AC
  Administered 2020-01-23: 1000 mg via INTRAVENOUS
  Filled 2020-01-23: qty 200

## 2020-01-23 MED ORDER — PROPOFOL 10 MG/ML IV BOLUS
INTRAVENOUS | Status: AC
  Filled 2020-01-23: qty 20

## 2020-01-23 MED ORDER — SODIUM CHLORIDE 0.9 % IV SOLN: INTRAVENOUS | Status: DC

## 2020-01-23 MED ORDER — LACTATED RINGERS IV SOLN: INTRAVENOUS | Status: DC

## 2020-01-23 MED ORDER — FENTANYL CITRATE (PF) 100 MCG/2ML IJ SOLN
INTRAMUSCULAR | Status: AC
  Administered 2020-01-23: 50 ug via INTRAVENOUS
  Filled 2020-01-23: qty 2

## 2020-01-23 MED ORDER — MIDAZOLAM HCL 2 MG/2ML IJ SOLN
INTRAMUSCULAR | Status: AC
  Filled 2020-01-23: qty 2

## 2020-01-23 MED ORDER — BUPIVACAINE-EPINEPHRINE (PF) 0.5% -1:200000 IJ SOLN
INTRAMUSCULAR | Status: DC | PRN
  Administered 2020-01-23: 30 mL via PERINEURAL

## 2020-01-23 MED ORDER — ORAL CARE MOUTH RINSE: 15.0000 mL | Freq: Once | OROMUCOSAL | Status: AC

## 2020-01-23 MED ORDER — FENTANYL CITRATE (PF) 100 MCG/2ML IJ SOLN: 50.0000 ug | Freq: Once | INTRAMUSCULAR | Status: AC

## 2020-01-23 MED ORDER — ACETAMINOPHEN 500 MG PO TABS
1000.0000 mg | ORAL_TABLET | Freq: Once | ORAL | Status: AC
  Administered 2020-01-23: 1000 mg via ORAL
  Filled 2020-01-23: qty 2

## 2020-01-23 MED ORDER — PROPOFOL 500 MG/50ML IV EMUL
INTRAVENOUS | Status: DC | PRN
  Administered 2020-01-23: 100 ug/kg/min via INTRAVENOUS

## 2020-01-23 MED ORDER — FENTANYL CITRATE (PF) 100 MCG/2ML IJ SOLN: 25.0000 ug | INTRAMUSCULAR | Status: DC | PRN

## 2020-01-23 MED ORDER — VANCOMYCIN HCL IN DEXTROSE 1-5 GM/200ML-% IV SOLN: 1000.0000 mg | INTRAVENOUS | Status: AC

## 2020-01-23 SURGICAL SUPPLY — 31 items
BLADE AVERAGE 25MMX9MM (BLADE)
BLADE AVERAGE 25X9 (BLADE) IMPLANT
BLADE SAW SGTL 81X20 HD (BLADE) IMPLANT
BNDG ELASTIC 4X5.8 VLCR STR LF (GAUZE/BANDAGES/DRESSINGS) ×3 IMPLANT
BNDG GAUZE ELAST 4 BULKY (GAUZE/BANDAGES/DRESSINGS) ×3 IMPLANT
CANISTER SUCT 3000ML PPV (MISCELLANEOUS) IMPLANT
COVER SURGICAL LIGHT HANDLE (MISCELLANEOUS) ×3 IMPLANT
COVER WAND RF STERILE (DRAPES) IMPLANT
DRAPE EXTREMITY T 121X128X90 (DISPOSABLE) ×3 IMPLANT
DRAPE HALF SHEET 40X57 (DRAPES) ×3 IMPLANT
DRSG EMULSION OIL 3X3 NADH (GAUZE/BANDAGES/DRESSINGS) ×3 IMPLANT
ELECT CAUTERY BLADE 6.4 (BLADE) ×3 IMPLANT
ELECT REM PT RETURN 9FT ADLT (ELECTROSURGICAL) ×3
ELECTRODE REM PT RTRN 9FT ADLT (ELECTROSURGICAL) ×1 IMPLANT
GAUZE SPONGE 4X4 12PLY STRL (GAUZE/BANDAGES/DRESSINGS) ×3 IMPLANT
GLOVE BIO SURGEON STRL SZ7.5 (GLOVE) ×3 IMPLANT
GOWN STRL REUS W/ TWL LRG LVL3 (GOWN DISPOSABLE) ×2 IMPLANT
GOWN STRL REUS W/ TWL XL LVL3 (GOWN DISPOSABLE) ×1 IMPLANT
GOWN STRL REUS W/TWL LRG LVL3 (GOWN DISPOSABLE) ×4
GOWN STRL REUS W/TWL XL LVL3 (GOWN DISPOSABLE) ×2
KIT BASIN OR (CUSTOM PROCEDURE TRAY) ×3 IMPLANT
KIT TURNOVER KIT B (KITS) ×3 IMPLANT
NEEDLE HYPO 25GX1X1/2 BEV (NEEDLE) IMPLANT
NS IRRIG 1000ML POUR BTL (IV SOLUTION) ×3 IMPLANT
PACK GENERAL/GYN (CUSTOM PROCEDURE TRAY) ×3 IMPLANT
PAD ARMBOARD 7.5X6 YLW CONV (MISCELLANEOUS) ×6 IMPLANT
SUT ETHILON 3 0 PS 1 (SUTURE) ×3 IMPLANT
SYR CONTROL 10ML LL (SYRINGE) IMPLANT
TOWEL GREEN STERILE (TOWEL DISPOSABLE) ×6 IMPLANT
UNDERPAD 30X36 HEAVY ABSORB (UNDERPADS AND DIAPERS) ×3 IMPLANT
WATER STERILE IRR 1000ML POUR (IV SOLUTION) ×3 IMPLANT

## 2020-01-23 NOTE — Anesthesia Postprocedure Evaluation (Signed)
Anesthesia Post Note  Patient: Geologist, engineering  Procedure(s) Performed: LEFT FIFTH TOE AMPUTATION (Left Foot)     Patient location during evaluation: PACU Anesthesia Type: Regional Level of consciousness: awake and alert Pain management: pain level controlled Vital Signs Assessment: post-procedure vital signs reviewed and stable Respiratory status: spontaneous breathing, nonlabored ventilation, respiratory function stable and patient connected to nasal cannula oxygen Cardiovascular status: stable and blood pressure returned to baseline Postop Assessment: no apparent nausea or vomiting Anesthetic complications: no   No complications documented.  Last Vitals:  Vitals:   01/23/20 1130 01/23/20 1138  BP: 104/63 (!) 106/91  Pulse: 80 81  Resp: 13 18  Temp:    SpO2: 97% 99%    Last Pain:  Vitals:   01/23/20 1115  TempSrc:   PainSc: 0-No pain                 Catalina Gravel

## 2020-01-23 NOTE — Progress Notes (Signed)
Orthopedic Tech Progress Note Patient Details:  Julie Prince 1960/11/30 [id number]  Ortho Devices Type of Ortho Device: Darco shoe Ortho Device/Splint Location: LLE Ortho Device/Splint Interventions: Ordered, Application, Adjustment   Post Interventions Patient Tolerated: Well Instructions Provided: Care of device, Adjustment of device, Poper ambulation with device   Aron Inge 01/23/2020, 12:09 PM

## 2020-01-23 NOTE — Discharge Instructions (Signed)
Heel weight bearing in black darco shoe.  You may shower in 48 hours remove dressing prior to showering.  After showering place dry dressing over amputation site until no drainage.

## 2020-01-23 NOTE — Transfer of Care (Signed)
Immediate Anesthesia Transfer of Care Note  Patient: [facility]  Procedure(s) Performed: LEFT FIFTH TOE AMPUTATION (Left Foot)  Patient Location: PACU  Anesthesia Type:MAC  Level of Consciousness: awake, alert , oriented and patient cooperative  Airway & Oxygen Therapy: Patient Spontanous Breathing and Patient connected to face mask oxygen  Post-op Assessment: Report given to RN and Post -op Vital signs reviewed and stable  Post vital signs: Reviewed and stable  Last Vitals:  Vitals Value Taken Time  BP    Temp    Pulse 81 01/23/20 1115  Resp 10 01/23/20 1115  SpO2 97 % 01/23/20 1115    Last Pain:  Vitals:   01/23/20 0845  TempSrc:   PainSc: 0-No pain      Patients Stated Pain Goal: 3 (97/28/20 6015)  Complications: No complications documented.

## 2020-01-23 NOTE — Interval H&P Note (Signed)
History and Physical Interval Note:  01/23/2020 9:34 AM  Julie Prince  has presented today for surgery, with the diagnosis of ULCER OF TOE OF LEFT FOOT.  The various methods of treatment have been discussed with the patient and family. After consideration of risks, benefits and other options for treatment, the patient has consented to  Procedure(s): LEFT FIFTH TOE AMPUTATION (Left) as a surgical intervention.  The patient's history has been reviewed, patient examined, no change in status, stable for surgery.  I have reviewed the patient's chart and labs.  Questions were answered to the patient's satisfaction.     Servando Snare

## 2020-01-23 NOTE — Op Note (Signed)
    Patient name: Julie Prince MRN: [id number] DOB: 09/08/1960 Sex: female  01/23/2020 Pre-operative Diagnosis: Gangrene left fifth toe Post-operative diagnosis:  Same Surgeon:  Erlene Quan C. Donzetta Matters, MD Procedure Performed:  Left fifth toe amputation  Indications: 59 year old female has undergone left lower extremity bypass surgery for ulceration of her left small toe.  This has failed to heal she is now indicated for amputation.  Findings: There was adequate bleeding in the wound bed.  The wound edges reapproximated easily.   Procedure:  The patient was identified in the holding area and taken to the operating room where she is placed supine on the operating table.  Preoperative block of been placed.  MAC anesthesia was induced and antibiotics were administered.  The block was checked and timeout was called.  We began with elliptical type incision around the base of the fifth toe.  We dissected down to the bone.  The bone was removed.  We rongeured back to healthy-appearing bone smoothed with rasp.  We thoroughly irrigated the wound obtain hemostasis and closed with interrupted 3-0 nylon suture.  Sterile dressing was placed.  She was awakened from anesthesia having tolerated procedure without immediate complication.  All counts were correct at completion.  EBL: 5 cc   Srihitha Tagliaferri C. Donzetta Matters, MD [facility] Office: [phone] Pager: [phone]

## 2020-01-23 NOTE — Anesthesia Procedure Notes (Signed)
Anesthesia Regional Block: Popliteal block   Pre-Anesthetic Checklist: ,, timeout performed, Correct Patient, Correct Site, Correct Laterality, Correct Procedure, Correct Position, site marked, Risks and benefits discussed,  Surgical consent,  Pre-op evaluation,  At surgeon's request and post-op pain management  Laterality: Left  Prep: chloraprep       Needles:  Injection technique: Single-shot  Needle Type: Echogenic Needle     Needle Length: 9cm  Needle Gauge: 21     Additional Needles:   Procedures:,,,, ultrasound used (permanent image in chart),,,,  Narrative:  Start time: 01/23/2020 8:30 AM End time: 01/23/2020 8:40 AM Injection made incrementally with aspirations every 5 mL.  Performed by: Personally  Anesthesiologist: Catalina Gravel, MD  Additional Notes: No pain on injection. No increased resistance to injection. Injection made in 5cc increments.  Good needle visualization.  Patient tolerated procedure well.

## 2020-01-24 ENCOUNTER — Encounter (HOSPITAL_COMMUNITY): Payer: Self-pay | Admitting: Vascular Surgery

## 2020-01-27 ENCOUNTER — Other Ambulatory Visit: Payer: Self-pay | Admitting: Physician Assistant

## 2020-01-27 DIAGNOSIS — E1165 Type 2 diabetes mellitus with hyperglycemia: Secondary | ICD-10-CM

## 2020-01-30 ENCOUNTER — Telehealth: Payer: Self-pay | Admitting: Physician Assistant

## 2020-01-30 NOTE — Telephone Encounter (Signed)
LVM for pt to call to discuss.  T. Shyloh Derosa, CMA  

## 2020-01-30 NOTE — Telephone Encounter (Signed)
This medication is not currently on the pt's medication list.  Additionally, per Herb Grays Abonza's last OV note, HTN is being managed by cardiology.  Pt should contact their office if she is indeed on this medication and needs refills.  Charyl Bigger, CMA

## 2020-01-30 NOTE — Telephone Encounter (Signed)
Patient needs refill on Losartan.  Please send to [place].

## 2020-02-04 NOTE — Progress Notes (Signed)
MyChart message sent to pt re: refill.  Charyl Bigger, CMA

## 2020-02-04 NOTE — Telephone Encounter (Signed)
MyChart message sent to pt to clarify refill request since we did not receive a return call.  Charyl Bigger, CMA

## 2020-02-07 ENCOUNTER — Ambulatory Visit (INDEPENDENT_AMBULATORY_CARE_PROVIDER_SITE_OTHER): Payer: Self-pay | Admitting: Physician Assistant

## 2020-02-07 ENCOUNTER — Other Ambulatory Visit: Payer: Self-pay

## 2020-02-07 VITALS — BP 199/101 | HR 86 | Temp 98.2°F | Resp 20 | Ht 64.0 in | Wt 230.4 lb

## 2020-02-07 DIAGNOSIS — S98132A Complete traumatic amputation of one left lesser toe, initial encounter: Secondary | ICD-10-CM

## 2020-02-07 DIAGNOSIS — I739 Peripheral vascular disease, unspecified: Secondary | ICD-10-CM

## 2020-02-07 NOTE — Progress Notes (Addendum)
POST OPERATIVE OFFICE NOTE    CC:  F/u for surgery  HPI:  This is a 59 y.o. female who is s/p left 5th toe amputation by Dr. Donzetta Matters on 01/23/20. She had previously undergone left lower extremity femoral to above knee popliteal bypass with vein on 11/12/19 for ulceration of her 5th toe. She subsequently needed to have the toe amputated due to nonhealing and infection that continued post operatively. She says she has been doing well since surgery. She has not needed to take any thing for pain since 2 days post op. She says she is still getting use to her toe not being there but otherwise she is ambulating with her Darco shoe without difficulty. She does not have any claudication, rest pain or new wounds. She is compliant with her Aspirin, statin and Pletal.  Allergies  Allergen Reactions  . Penicillins Hives and Shortness Of Breath  . Metformin And Related Diarrhea    Current Outpatient Medications  Medication Sig Dispense Refill  . aspirin EC 81 MG tablet Take 81 mg by mouth in the morning. Swallow whole.    Marland Kitchen atorvastatin (LIPITOR) 40 MG tablet Take 1 tablet (40 mg total) by mouth at bedtime. 90 tablet 1  . cilostazol (PLETAL) 50 MG tablet Take 1 tablet (50 mg total) by mouth 2 (two) times daily. 180 tablet 3  . Continuous Blood Gluc Sensor (FREESTYLE LIBRE 14 DAY SENSOR) MISC TEST DAILY AS DIRECTED AND CHANGE EVERY 14 DAYS 2 each 0  . insulin degludec (TRESIBA FLEXTOUCH) 100 UNIT/ML FlexTouch Pen INJECT 50 UNITS SUBCUTANEOUS AT BEDTIME (Patient taking differently: Inject 50 Units into the skin at bedtime. ) 3 mL 1  . metoprolol tartrate (LOPRESSOR) 50 MG tablet Take 1.5 tablets (75 mg total) by mouth 2 (two) times daily. (Patient taking differently: Take 50 mg by mouth 2 (two) times daily. ) 270 tablet 1  . oxyCODONE-acetaminophen (PERCOCET/ROXICET) 5-325 MG tablet Take 1 tablet by mouth every 6 (six) hours as needed for moderate pain. 10 tablet 0  . Semaglutide,0.25 or 0.5MG /DOS, (OZEMPIC,  0.25 OR 0.5 MG/DOSE,) 2 MG/1.5ML SOPN Inject 0.25 mg Pen Mar once weekly for 4 weeks. Then 0.5 mg [place] once weekly. (Patient taking differently: Inject 0.5 mg into the skin every 7 (seven) days. On Tuesdays) 1.5 mL 3   No current facility-administered medications for this visit.     ROS:  See HPI  Physical Exam:  Vitals:   02/07/20 1130  BP: (!) 199/101  Pulse: 86  Resp: 20  Temp: 98.2 F (36.8 C)  TempSrc: Temporal  SpO2: 97%  Weight: 230 lb 6.4 oz (104.5 kg)  Height: 5\' 4"  (1.626 m)   General: very pleasant female, well appearing, well nourished Incision:  Left 5th toe amputation site healing well. Dark eschar present over suture line. 3 Nylon interrupted sutures removed. No swelling, erythema or drainage    Extremities:  Lower extremities are well perfused and warm. Brisk doppler PT/ Dp signals bilaterally Neuro: alert and oriented  Assessment/Plan:  This is a 59 y.o. female who is s/p amputation of left 5th toe 01/23/20. She is doing well post op. Her amputation site is healing well. Sutures were removed today. She has no signs of infection. Her lower extremities are well perfused and warm with brisk doppler signals bilaterally. - She will continue her Aspirin, Statin, and Pletal - Advised her to follow up sooner if she has concerns about her wound healing or has new or worsening symptoms -She will have follow  up in 3 months with LLE bypass graft duplex and ABIs   Karoline Caldwell, PA-C [facility] [phone]  Clinic MD:  Dr. Donzetta Matters

## 2020-02-11 ENCOUNTER — Other Ambulatory Visit: Payer: Self-pay

## 2020-02-11 DIAGNOSIS — I739 Peripheral vascular disease, unspecified: Secondary | ICD-10-CM

## 2020-02-15 ENCOUNTER — Other Ambulatory Visit: Payer: Self-pay | Admitting: Physician Assistant

## 2020-03-02 ENCOUNTER — Other Ambulatory Visit: Payer: Self-pay | Admitting: Physician Assistant

## 2020-03-02 DIAGNOSIS — E1165 Type 2 diabetes mellitus with hyperglycemia: Secondary | ICD-10-CM

## 2020-03-05 IMAGING — MG DIGITAL SCREENING BILATERAL MAMMOGRAM WITH TOMO AND CAD
8 series · 8 of 24 positions shown · non-contrast
Comparison: Previous exam(s).

ACR Breast Density Category a: The breast tissue is almost entirely
fatty.

CLINICAL DATA: Screening.

EXAM:
DIGITAL SCREENING BILATERAL MAMMOGRAM WITH TOMO AND CAD

[L CC synth-2D]
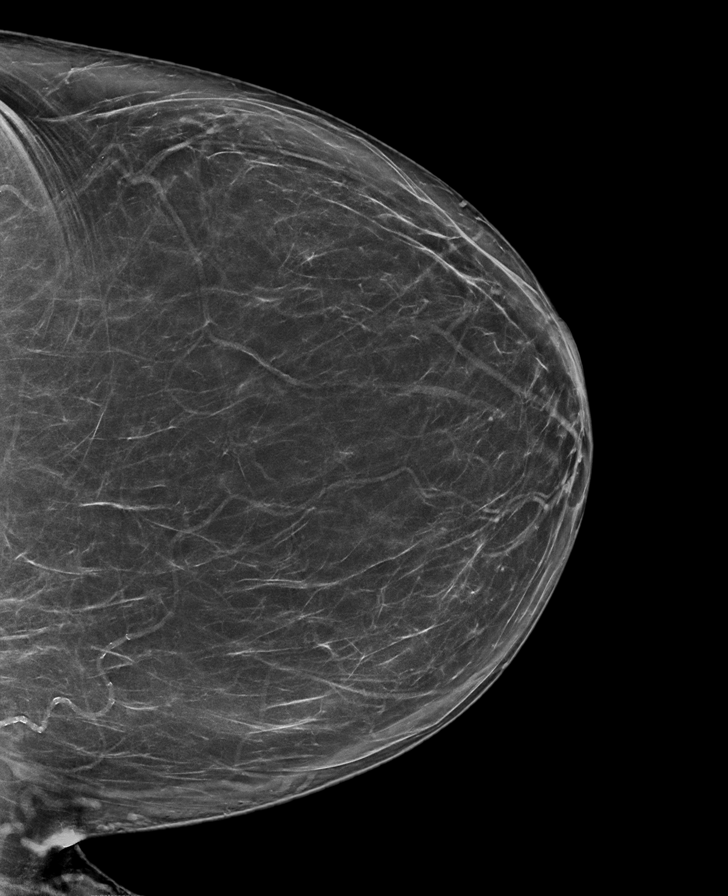

[L MLO synth-2D]
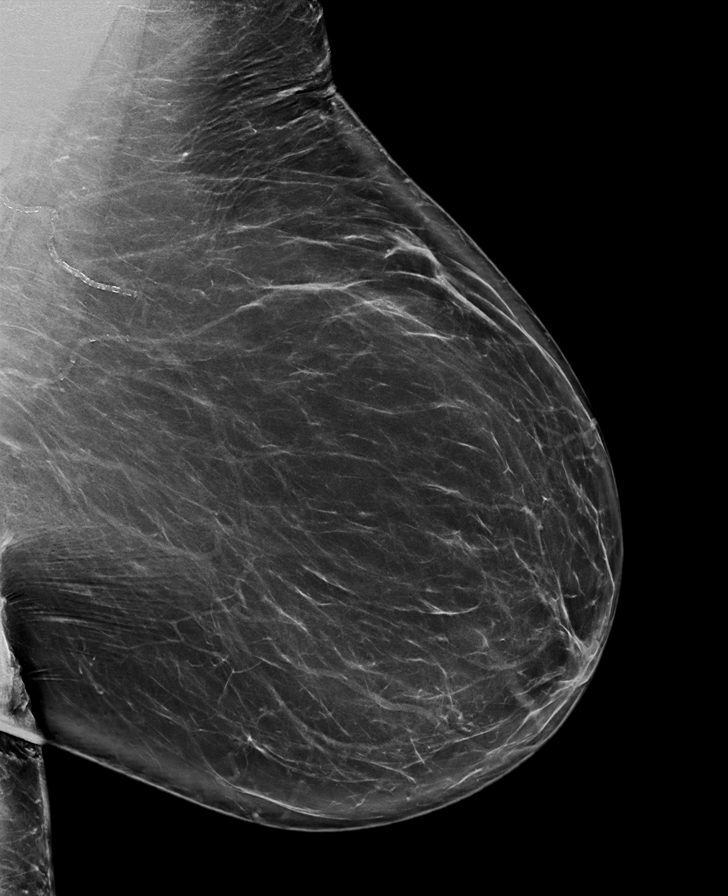

[R MLO synth-2D]
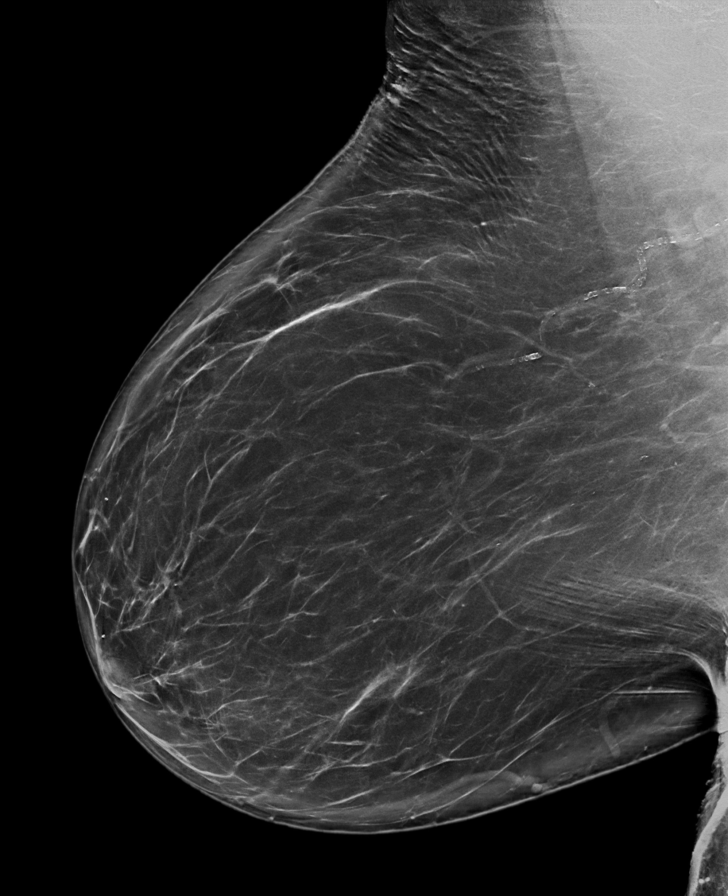

[R CC synth-2D]
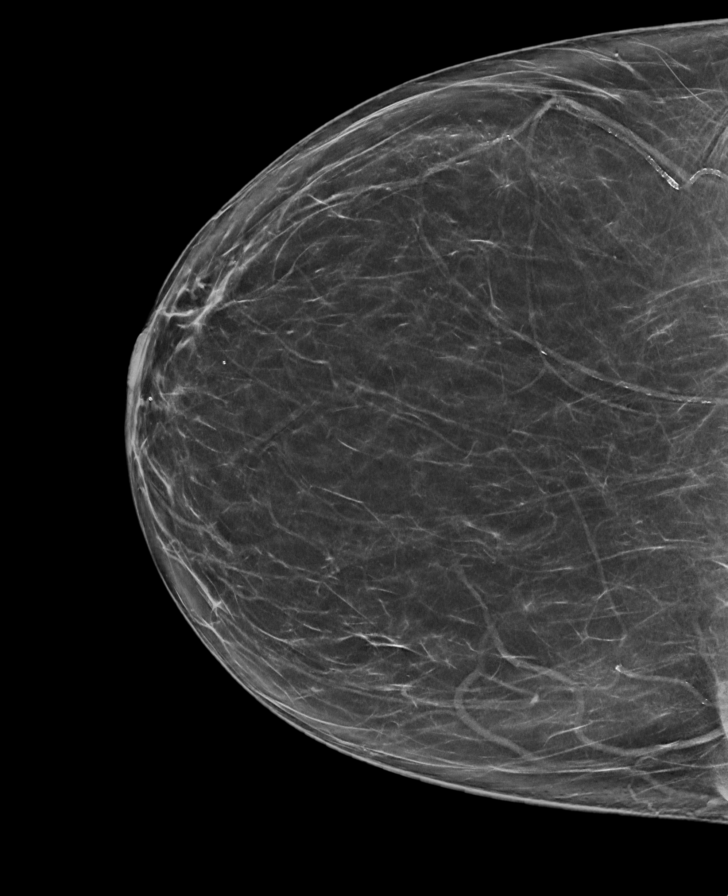

[R CC tomo · tomo slice 42/83.0]
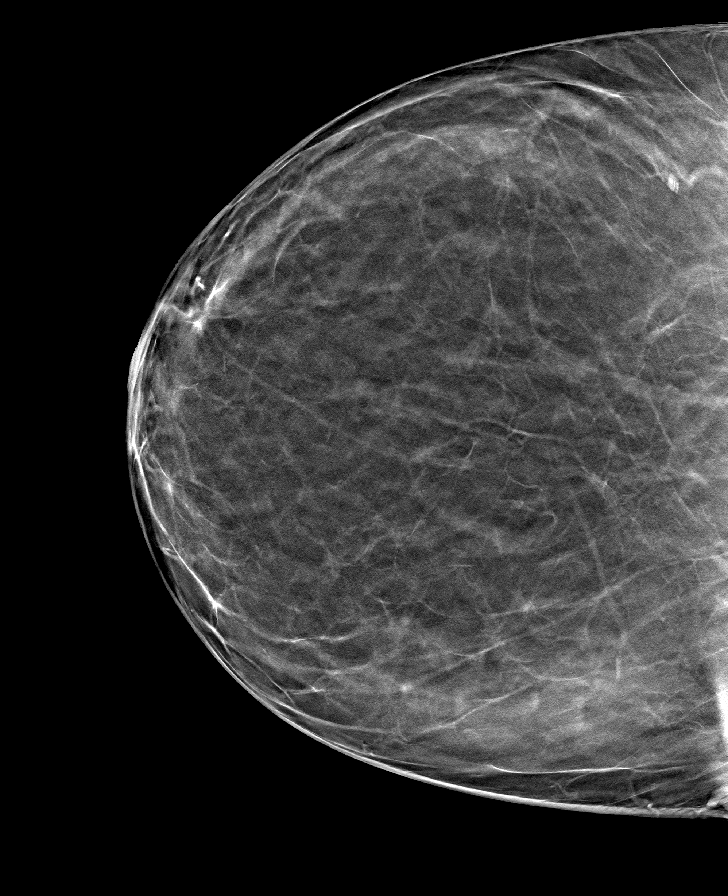

[L MLO tomo · tomo slice 51/100.0]
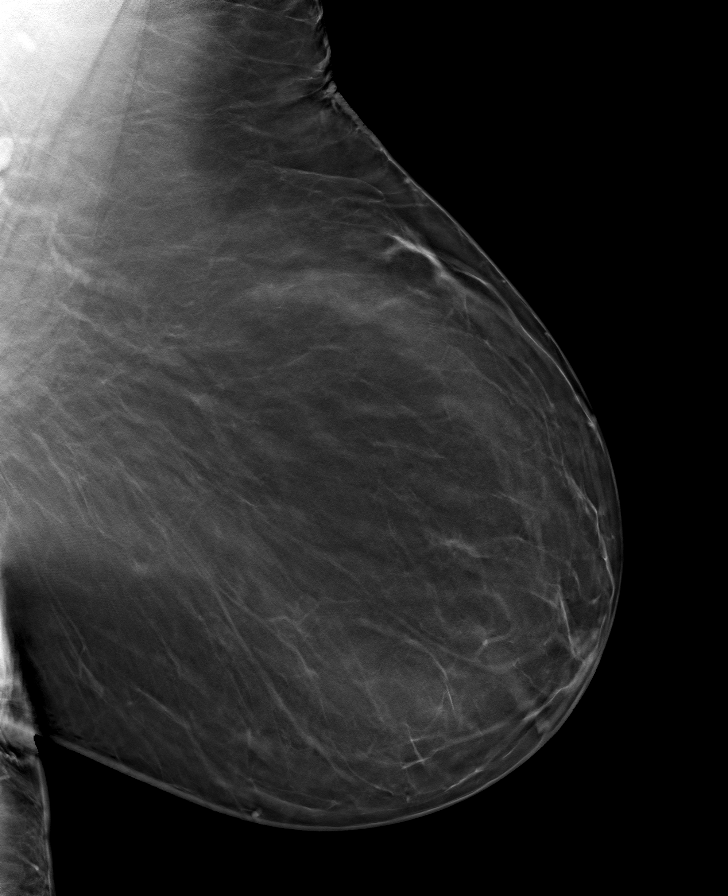

[L CC tomo · tomo slice 44/87.0]
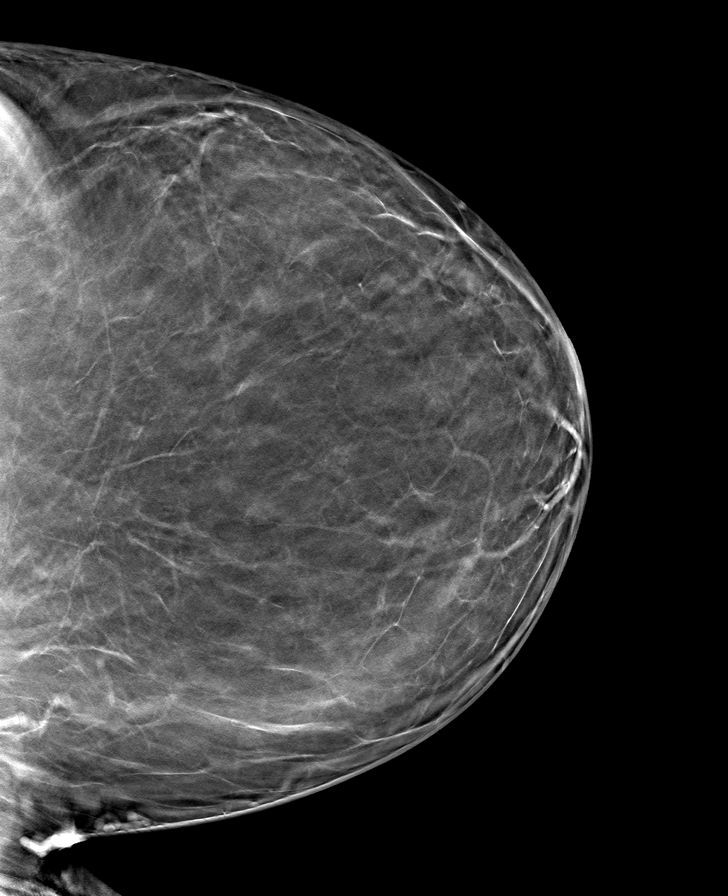

[R MLO tomo · tomo slice 51/102.0]
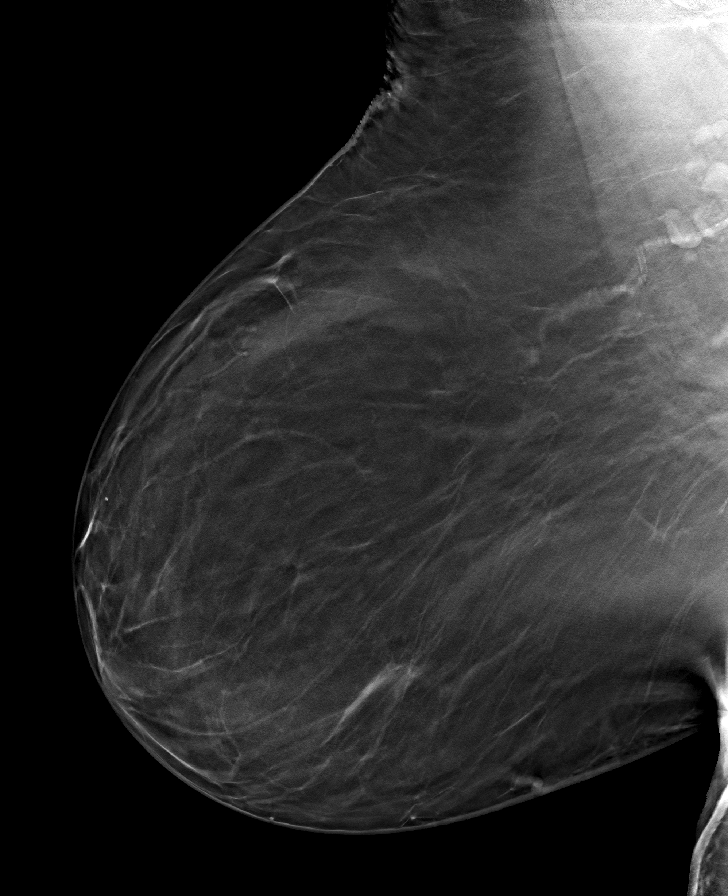

[8 of 24 positions shown; findings below may reference images not displayed]

FINDINGS: There are no findings suspicious for malignancy. <CAD Text>
IMPRESSION: No mammographic evidence of malignancy. A result letter of this
screening mammogram will be mailed directly to the patient.

RECOMMENDATION:
<Mammography Recommendation>

BI-RADS CATEGORY  1: Negative.

## 2020-03-12 ENCOUNTER — Encounter: Payer: Self-pay | Admitting: Physician Assistant

## 2020-03-12 ENCOUNTER — Other Ambulatory Visit: Payer: Self-pay

## 2020-03-12 ENCOUNTER — Ambulatory Visit (INDEPENDENT_AMBULATORY_CARE_PROVIDER_SITE_OTHER): Payer: 59 | Admitting: Physician Assistant

## 2020-03-12 VITALS — BP 159/92 | HR 118 | Ht 64.0 in | Wt 229.0 lb

## 2020-03-12 DIAGNOSIS — E1159 Type 2 diabetes mellitus with other circulatory complications: Secondary | ICD-10-CM

## 2020-03-12 DIAGNOSIS — E782 Mixed hyperlipidemia: Secondary | ICD-10-CM

## 2020-03-12 DIAGNOSIS — I739 Peripheral vascular disease, unspecified: Secondary | ICD-10-CM | POA: Diagnosis not present

## 2020-03-12 DIAGNOSIS — I152 Hypertension secondary to endocrine disorders: Secondary | ICD-10-CM

## 2020-03-12 DIAGNOSIS — E118 Type 2 diabetes mellitus with unspecified complications: Secondary | ICD-10-CM | POA: Diagnosis not present

## 2020-03-12 DIAGNOSIS — E559 Vitamin D deficiency, unspecified: Secondary | ICD-10-CM | POA: Diagnosis not present

## 2020-03-12 DIAGNOSIS — E1169 Type 2 diabetes mellitus with other specified complication: Secondary | ICD-10-CM

## 2020-03-12 DIAGNOSIS — H6122 Impacted cerumen, left ear: Secondary | ICD-10-CM

## 2020-03-12 LAB — POCT UA - MICROALBUMIN
Albumin/Creatinine Ratio, Urine, POC: <30
Creatinine, POC: 50 mg/dL
Microalbumin Ur, POC: 10 mg/L

## 2020-03-12 LAB — POCT GLYCOSYLATED HEMOGLOBIN (HGB A1C): Hemoglobin A1C: 6.6 % — AB (ref 4.0–5.6)

## 2020-03-12 NOTE — Progress Notes (Signed)
Established Patient Office Visit  Subjective:  Patient ID: Julie Prince, female    DOB: 1961/02/25  Age: 59 y.o. MRN: [id number]  CC:  Chief Complaint  Patient presents with  . Diabetes    HPI [facility] presents for follow up on diabetes mellitus and hypertension. Requesting to have ears checked, fells there is something crawling in left ear. Denies otalgia, fever or chills.  Diabetes mellitus: Pt denies increased urination or thirst. Pt reports medication compliance. No hypoglycemic events. Checking glucose at home. FBS average in 90s. States watches what she eats due to heartburn symptoms.  HTN: Pt denies chest pain, new palpitations, dizziness, headache or increased lower extremity swelling from baseline.  Taking medication as directed without side effects. Checks BP at home and readings range 120s/70s. Pt follows a low salt diet.  PAD: S/p fifth toe amputation performed 01/2020 by Dr. Donzetta Matters. Patient had left lower extremity femoral to above knee popliteal bypass 10/2019, however, ultimately had amputation due to a nonhealing ulcer and infection. Patient reports is recovering well. Uses her shoe as instructed.    Past Medical History:  Diagnosis Date  . Allergy   . Barrett esophagus   . Cataract    left cataract removal  . Essential hypertension, benign 09/12/2013  . GERD (gastroesophageal reflux disease)   . Hyperlipidemia 09/12/2013  . 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 Peripheral vascular disease ([place])   . PONV (postoperative nausea and vomiting)   . Type 2 diabetes mellitus ([place]) 09/12/2013   Dr. Posey Pronto at [place]   . Wears partial dentures    upper    Past Surgical History:  Procedure Laterality Date  . ABDOMINAL AORTOGRAM W/LOWER EXTREMITY N/A 06/06/2018   Procedure: ABDOMINAL AORTOGRAM W/LOWER EXTREMITY;  Surgeon: Wellington Hampshire, MD;  Location: [place] [facility];  Service: Cardiovascular;  Laterality: N/A;  . ABDOMINAL AORTOGRAM  W/LOWER EXTREMITY N/A 09/18/2019   Procedure: ABDOMINAL AORTOGRAM W/LOWER EXTREMITY;  Surgeon: Wellington Hampshire, MD;  Location: [place] [facility];  Service: Cardiovascular;  Laterality: N/A;  . AMPUTATION Left 01/23/2020   Procedure: LEFT FIFTH TOE AMPUTATION;  Surgeon: Waynetta Sandy, MD;  Location: [place];  Service: Vascular;  Laterality: Left;  . APPENDECTOMY    . CATARACT EXTRACTION Left   . CESAREAN SECTION  1990  . COLONOSCOPY    . FEMORAL-POPLITEAL BYPASS GRAFT Left 11/12/2019   Procedure: BYPASS GRAFT FEMORAL-ABOVE KNEE POPLITEAL ARTERY;  Surgeon: Waynetta Sandy, MD;  Location: [place];  Service: Vascular;  Laterality: Left;  . right tube and ovary removed    . WISDOM TOOTH EXTRACTION      Family History  Problem Relation Age of Onset  . Lung cancer Mother   . Stroke Father   . Heart disease Maternal Aunt   . Fibromyalgia Sister   . Alzheimer's disease Maternal Grandmother   . Colon cancer Neg Hx   . Esophageal cancer Neg Hx   . Rectal cancer Neg Hx   . Stomach cancer Neg Hx   . Pancreatic cancer Neg Hx     Social History   Socioeconomic History  . Marital status: Single    Spouse name: Not on file  . Number of children: Not on file  . Years of education: Not on file  . Highest education level: Not on file  Occupational History  . Not on file  Tobacco Use  . Smoking status: Former Smoker    Packs/day: 0.50    Quit  date: 11/07/2000    Years since quitting: 19.3  . Smokeless tobacco: Never Used  . Tobacco comment: quit 30 plus years ago.  Vaping Use  . Vaping Use: Never used  Substance and Sexual Activity  . Alcohol use: No    Alcohol/week: 0.0 standard drinks  . Drug use: No  . Sexual activity: Yes    Birth control/protection: Post-menopausal  Other Topics Concern  . Not on file  Social History Narrative  . Not on file   Social Determinants of Health   Financial Resource Strain: Not on file  Food Insecurity: Not on file   Transportation Needs: Not on file  Physical Activity: Not on file  Stress: Not on file  Social Connections: Not on file  Intimate Partner Violence: Not on file    Outpatient Medications Prior to Visit  Medication Sig Dispense Refill  . aspirin EC 81 MG tablet Take 81 mg by mouth in the morning. Swallow whole.    Marland Kitchen atorvastatin (LIPITOR) 40 MG tablet Take 1 tablet (40 mg total) by mouth at bedtime. 90 tablet 1  . cilostazol (PLETAL) 50 MG tablet Take 1 tablet (50 mg total) by mouth 2 (two) times daily. 180 tablet 3  . Continuous Blood Gluc Sensor (FREESTYLE LIBRE 14 DAY SENSOR) MISC TEST DAILY AS DIRECTED AND CHANGE EVERY 14 DAYS 2 each 0  . insulin degludec (TRESIBA FLEXTOUCH) 100 UNIT/ML FlexTouch Pen INJECT 50 UNITS SUBCUTANEOUSLY AT BEDTIME 15 mL 0  . metoprolol tartrate (LOPRESSOR) 50 MG tablet Take 1.5 tablets (75 mg total) by mouth 2 (two) times daily. (Patient taking differently: Take 50 mg by mouth 2 (two) times daily.) 270 tablet 1  . Semaglutide,0.25 or 0.5MG /DOS, (OZEMPIC, 0.25 OR 0.5 MG/DOSE,) 2 MG/1.5ML SOPN Inject 0.25 mg [place] once weekly for 4 weeks. Then 0.5 mg [place] once weekly. (Patient taking differently: Inject 0.5 mg into the skin every 7 (seven) days. On Tuesdays) 1.5 mL 3  . oxyCODONE-acetaminophen (PERCOCET/ROXICET) 5-325 MG tablet Take 1 tablet by mouth every 6 (six) hours as needed for moderate pain. (Patient not taking: Reported on 03/12/2020) 10 tablet 0   No facility-administered medications prior to visit.    Allergies  Allergen Reactions  . Penicillins Hives and Shortness Of Breath  . Metformin And Related Diarrhea    ROS Review of Systems Review of Systems:  A fourteen system review of systems was performed and found to be positive as per HPI.   Objective:    Physical Exam General: Pleasant and cooperative, in no acute distress Neuro:  Alert and oriented,  extra-ocular muscles intact  HEENT:  Normocephalic, atraumatic, cerumen impaction of left ear,  normal TM of right ear Skin:  no gross rash, warm, pink. Cardiac:  RRR, S1 S2, w/o murmur Respiratory:  ECTA B/L, Not using accessory muscles, speaking in full sentences- unlabored. Vascular:  Ext warm, no cyanosis apprec. Psych:  No HI/SI, judgement and insight good, Euthymic mood. Full Affect.   BP (!) 159/92   Pulse (!) 118   Ht 5\' 4"  (1.626 m)   Wt 229 lb (103.9 kg)   LMP  (LMP Unknown)   SpO2 100%   BMI 39.31 kg/m  Wt Readings from Last 3 Encounters:  03/12/20 229 lb (103.9 kg)  02/07/20 230 lb 6.4 oz (104.5 kg)  01/23/20 215 lb (97.5 kg)     Health Maintenance Due  Topic Date Due  . COVID-19 Vaccine (1) Never done  . PAP SMEAR-Modifier  04/14/2018  . OPHTHALMOLOGY EXAM  07/08/2018  . FOOT EXAM  07/13/2018  . MAMMOGRAM  06/09/2019  . INFLUENZA VACCINE  10/20/2019    There are no preventive care reminders to display for this patient.  Lab Results  Component Value Date   TSH 1.790 12/26/2018   Lab Results  Component Value Date   WBC 6.7 03/12/2020   HGB 13.7 03/12/2020   HCT 40.1 03/12/2020   MCV 91 03/12/2020   PLT 270 03/12/2020   Lab Results  Component Value Date   NA 146 (H) 03/12/2020   K 5.0 03/12/2020   CO2 26 03/12/2020   GLUCOSE 81 03/12/2020   BUN 11 03/12/2020   CREATININE 0.94 03/12/2020   BILITOT 0.3 03/12/2020   ALKPHOS 111 03/12/2020   AST 13 03/12/2020   ALT 14 03/12/2020   PROT 7.2 03/12/2020   ALBUMIN 4.2 03/12/2020   CALCIUM 9.4 03/12/2020   ANIONGAP 10 01/23/2020   Lab Results  Component Value Date   CHOL 145 03/12/2020   Lab Results  Component Value Date   HDL 50 03/12/2020   Lab Results  Component Value Date   LDLCALC 81 03/12/2020   Lab Results  Component Value Date   TRIG 70 03/12/2020   Lab Results  Component Value Date   CHOLHDL 2.9 03/12/2020   Lab Results  Component Value Date   HGBA1C 6.6 (A) 03/12/2020      Assessment & Plan:   Problem List Items Addressed This Visit      Cardiovascular and  Mediastinum   PAD (peripheral artery disease) ([place])-  See's Cards- Dr Elaina Pattee [place]/ that group for this condition - Primary (Chronic)   Hypertension associated with diabetes ([place])   Relevant Orders   Comp Met (CMET) (Completed)   Lipid Profile (Completed)   CBC w/Diff (Completed)     Endocrine   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 -S/p amputation of left fifth toe. -On exam lower extremities are warm without signs of infection/cellulitis. -On Lipitor, Aspirin and Pletal.  Currently controlled diabetes mellitus type 2 with complications: -I9J today is 6.6, significantly improved from 13.5 (4 mons ago) -Continue current medication regimen. -Continue to monitor carbohydrates and glucose. -UA microalbumin collected today, normal -Will continue to monitor.  Hypertension associated with diabetes: -BP elevated. Per patient, ambulatory BP is controlled. -Continue current medication regimen. -Recommend to continue ambulatory BP monitoring including pulse and recommend to notify the office if BP consistently >140/90 for medication adjustments. -Recommend to follow a low sodium diet and stay well hydrated. -Will continue to monitor alongside cardiology.   Mixed diabetic hyperlipidemia associated with type 2 diabetes mellitus:  -Last lipid panel: total cholesterol 104, triglycerides 88, HDL 26, LDL 60 -Continue Lipitor 40  mg. -Recommend to follow a heart healthy diet low in saturated and trans fats. -Will continue to monitor and repeat lipid panel and hepatic function  today (pt is fasting).   Vitamin D deficiency: -Last Vitamin D 16.6, low -Will recheck Vitamin D today. Pending results will start weekly Vitamin D if indicated.   Impacted cerumen of left ear: -Patient prefers to use OTC Debrox ear drops for cerumen removal. Declined ear irrigation in office.  No orders of the defined types were placed in this encounter.   Follow-up: Return in about 3 months (around 06/10/2020) for DM, HTN, HLD .    [name], PA-C

## 2020-03-12 NOTE — Patient Instructions (Signed)
DASH Eating Plan DASH stands for "Dietary Approaches to Stop Hypertension." The DASH eating plan is a healthy eating plan that has been shown to reduce high blood pressure (hypertension). It may also reduce your risk for type 2 diabetes, heart disease, and stroke. The DASH eating plan may also help with weight loss. What are tips for following this plan?  General guidelines  Avoid eating more than 2,300 mg (milligrams) of salt (sodium) a day. If you have hypertension, you may need to reduce your sodium intake to 1,500 mg a day.  Limit alcohol intake to no more than 1 drink a day for nonpregnant women and 2 drinks a day for men. One drink equals 12 oz of beer, 5 oz of wine, or 1 oz of hard liquor.  Work with your health care provider to maintain a healthy body weight or to lose weight. Ask what an ideal weight is for you.  Get at least 30 minutes of exercise that causes your heart to beat faster (aerobic exercise) most days of the week. Activities may include walking, swimming, or biking.  Work with your health care provider or diet and nutrition specialist (dietitian) to adjust your eating plan to your individual calorie needs. Reading food labels   Check food labels for the amount of sodium per serving. Choose foods with less than 5 percent of the Daily Value of sodium. Generally, foods with less than 300 mg of sodium per serving fit into this eating plan.  To find whole grains, look for the word "whole" as the first word in the ingredient list. Shopping  Buy products labeled as "low-sodium" or "no salt added."  Buy fresh foods. Avoid canned foods and premade or frozen meals. Cooking  Avoid adding salt when cooking. Use salt-free seasonings or herbs instead of table salt or sea salt. Check with your health care provider or pharmacist before using salt substitutes.  Do not fry foods. Cook foods using healthy methods such as baking, boiling, grilling, and broiling instead.  Cook with  heart-healthy oils, such as olive, canola, soybean, or sunflower oil. Meal planning  Eat a balanced diet that includes: ? 5 or more servings of fruits and vegetables each day. At each meal, try to fill half of your plate with fruits and vegetables. ? Up to 6-8 servings of whole grains each day. ? Less than 6 oz of lean meat, poultry, or fish each day. A 3-oz serving of meat is about the same size as a deck of cards. One egg equals 1 oz. ? 2 servings of low-fat dairy each day. ? A serving of nuts, seeds, or beans 5 times each week. ? Heart-healthy fats. Healthy fats called Omega-3 fatty acids are found in foods such as flaxseeds and coldwater fish, like sardines, salmon, and mackerel.  Limit how much you eat of the following: ? Canned or prepackaged foods. ? Food that is high in trans fat, such as fried foods. ? Food that is high in saturated fat, such as fatty meat. ? Sweets, desserts, sugary drinks, and other foods with added sugar. ? Full-fat dairy products.  Do not salt foods before eating.  Try to eat at least 2 vegetarian meals each week.  Eat more home-cooked food and less restaurant, buffet, and fast food.  When eating at a restaurant, ask that your food be prepared with less salt or no salt, if possible. What foods are recommended? The items listed may not be a complete list. Talk with your dietitian about   what dietary choices are best for you. Grains Whole-grain or whole-wheat bread. Whole-grain or whole-wheat pasta. Brown rice. Oatmeal. Quinoa. Bulgur. Whole-grain and low-sodium cereals. Pita bread. Low-fat, low-sodium crackers. Whole-wheat flour tortillas. Vegetables Fresh or frozen vegetables (raw, steamed, roasted, or grilled). Low-sodium or reduced-sodium tomato and vegetable juice. Low-sodium or reduced-sodium tomato sauce and tomato paste. Low-sodium or reduced-sodium canned vegetables. Fruits All fresh, dried, or frozen fruit. Canned fruit in natural juice (without  added sugar). Meat and other protein foods Skinless chicken or turkey. Ground chicken or turkey. Pork with fat trimmed off. Fish and seafood. Egg whites. Dried beans, peas, or lentils. Unsalted nuts, nut butters, and seeds. Unsalted canned beans. Lean cuts of beef with fat trimmed off. Low-sodium, lean deli meat. Dairy Low-fat (1%) or fat-free (skim) milk. Fat-free, low-fat, or reduced-fat cheeses. Nonfat, low-sodium ricotta or cottage cheese. Low-fat or nonfat yogurt. Low-fat, low-sodium cheese. Fats and oils Soft margarine without trans fats. Vegetable oil. Low-fat, reduced-fat, or light mayonnaise and salad dressings (reduced-sodium). Canola, safflower, olive, soybean, and sunflower oils. Avocado. Seasoning and other foods Herbs. Spices. Seasoning mixes without salt. Unsalted popcorn and pretzels. Fat-free sweets. What foods are not recommended? The items listed may not be a complete list. Talk with your dietitian about what dietary choices are best for you. Grains Baked goods made with fat, such as croissants, muffins, or some breads. Dry pasta or rice meal packs. Vegetables Creamed or fried vegetables. Vegetables in a cheese sauce. Regular canned vegetables (not low-sodium or reduced-sodium). Regular canned tomato sauce and paste (not low-sodium or reduced-sodium). Regular tomato and vegetable juice (not low-sodium or reduced-sodium). Pickles. Olives. Fruits Canned fruit in a light or heavy syrup. Fried fruit. Fruit in cream or butter sauce. Meat and other protein foods Fatty cuts of meat. Ribs. Fried meat. Bacon. Sausage. Bologna and other processed lunch meats. Salami. Fatback. Hotdogs. Bratwurst. Salted nuts and seeds. Canned beans with added salt. Canned or smoked fish. Whole eggs or egg yolks. Chicken or turkey with skin. Dairy Whole or 2% milk, cream, and half-and-half. Whole or full-fat cream cheese. Whole-fat or sweetened yogurt. Full-fat cheese. Nondairy creamers. Whipped toppings.  Processed cheese and cheese spreads. Fats and oils Butter. Stick margarine. Lard. Shortening. Ghee. Bacon fat. Tropical oils, such as coconut, palm kernel, or palm oil. Seasoning and other foods Salted popcorn and pretzels. Onion salt, garlic salt, seasoned salt, table salt, and sea salt. Worcestershire sauce. Tartar sauce. Barbecue sauce. Teriyaki sauce. Soy sauce, including reduced-sodium. Steak sauce. Canned and packaged gravies. Fish sauce. Oyster sauce. Cocktail sauce. Horseradish that you find on the shelf. Ketchup. Mustard. Meat flavorings and tenderizers. Bouillon cubes. Hot sauce and Tabasco sauce. Premade or packaged marinades. Premade or packaged taco seasonings. Relishes. Regular salad dressings. Where to find more information:  National Heart, Lung, and Blood Institute: www.nhlbi.nih.gov  American Heart Association: www.heart.org Summary  The DASH eating plan is a healthy eating plan that has been shown to reduce high blood pressure (hypertension). It may also reduce your risk for type 2 diabetes, heart disease, and stroke.  With the DASH eating plan, you should limit salt (sodium) intake to 2,300 mg a day. If you have hypertension, you may need to reduce your sodium intake to 1,500 mg a day.  When on the DASH eating plan, aim to eat more fresh fruits and vegetables, whole grains, lean proteins, low-fat dairy, and heart-healthy fats.  Work with your health care provider or diet and nutrition specialist (dietitian) to adjust your eating plan to your   individual calorie needs. This information is not intended to replace advice given to you by your health care provider. Make sure you discuss any questions you have with your health care provider. Document Revised: 02/17/2017 Document Reviewed: 02/29/2016 Elsevier Patient Education  2020 [employer].  

## 2020-03-13 LAB — COMPREHENSIVE METABOLIC PANEL
ALT: 14 IU/L (ref 0–32)
AST: 13 IU/L (ref 0–40)
Albumin/Globulin Ratio: 1.4 (ref 1.2–2.2)
Albumin: 4.2 g/dL (ref 3.8–4.9)
Alkaline Phosphatase: 111 IU/L (ref 44–121)
BUN/Creatinine Ratio: 12 (ref 9–23)
BUN: 11 mg/dL (ref 6–24)
Bilirubin Total: 0.3 mg/dL (ref 0.0–1.2)
CO2: 26 mmol/L (ref 20–29)
Calcium: 9.4 mg/dL (ref 8.7–10.2)
Chloride: 106 mmol/L (ref 96–106)
Creatinine, Ser: 0.94 mg/dL (ref 0.57–1.00)
GFR calc Af Amer: 77 mL/min/{1.73_m2} (ref >59)
GFR calc non Af Amer: 67 mL/min/{1.73_m2} (ref >59)
Globulin, Total: 3 g/dL (ref 1.5–4.5)
Glucose: 81 mg/dL (ref 65–99)
Potassium: 5 mmol/L (ref 3.5–5.2)
Sodium: 146 mmol/L — ABNORMAL HIGH (ref 134–144)
Total Protein: 7.2 g/dL (ref 6.0–8.5)

## 2020-03-13 LAB — CBC WITH DIFFERENTIAL/PLATELET
Basophils Absolute: 0 10*3/uL (ref 0.0–0.2)
Basos: 1 %
EOS (ABSOLUTE): 0.3 10*3/uL (ref 0.0–0.4)
Eos: 4 %
Hematocrit: 40.1 % (ref 34.0–46.6)
Hemoglobin: 13.7 g/dL (ref 11.1–15.9)
Immature Grans (Abs): 0 10*3/uL (ref 0.0–0.1)
Immature Granulocytes: 0 %
Lymphocytes Absolute: 2.3 10*3/uL (ref 0.7–3.1)
Lymphs: 35 %
MCH: 31.1 pg (ref 26.6–33.0)
MCHC: 34.2 g/dL (ref 31.5–35.7)
MCV: 91 fL (ref 79–97)
Monocytes Absolute: 0.7 10*3/uL (ref 0.1–0.9)
Monocytes: 10 %
Neutrophils Absolute: 3.4 10*3/uL (ref 1.4–7.0)
Neutrophils: 50 %
Platelets: 270 10*3/uL (ref 150–450)
RBC: 4.41 x10E6/uL (ref 3.77–5.28)
RDW: 13.4 % (ref 11.7–15.4)
WBC: 6.7 10*3/uL (ref 3.4–10.8)

## 2020-03-13 LAB — LIPID PANEL
Chol/HDL Ratio: 2.9 ratio (ref 0.0–4.4)
Cholesterol, Total: 145 mg/dL (ref 100–199)
HDL: 50 mg/dL (ref >39)
LDL Chol Calc (NIH): 81 mg/dL (ref 0–99)
Triglycerides: 70 mg/dL (ref 0–149)
VLDL Cholesterol Cal: 14 mg/dL (ref 5–40)

## 2020-03-13 LAB — VITAMIN D 25 HYDROXY (VIT D DEFICIENCY, FRACTURES): Vit D, 25-Hydroxy: 30.3 ng/mL (ref 30.0–100.0)

## 2020-03-18 ENCOUNTER — Telehealth: Payer: Self-pay | Admitting: Physician Assistant

## 2020-03-18 NOTE — Telephone Encounter (Signed)
called to schedule appointment, believes patient has strep throat. advised to go to urgent care. provided [place] location. thank you

## 2020-03-23 ENCOUNTER — Other Ambulatory Visit: Payer: Self-pay | Admitting: Physician Assistant

## 2020-04-08 ENCOUNTER — Encounter: Payer: Self-pay | Admitting: *Deleted

## 2020-04-09 ENCOUNTER — Other Ambulatory Visit: Payer: Self-pay | Admitting: Physician Assistant

## 2020-04-09 DIAGNOSIS — E1165 Type 2 diabetes mellitus with hyperglycemia: Secondary | ICD-10-CM

## 2020-04-10 ENCOUNTER — Other Ambulatory Visit: Payer: Self-pay | Admitting: Cardiovascular Disease

## 2020-04-10 NOTE — Telephone Encounter (Signed)
Refill request

## 2020-04-13 ENCOUNTER — Encounter: Payer: Self-pay | Admitting: Internal Medicine

## 2020-04-13 ENCOUNTER — Ambulatory Visit (INDEPENDENT_AMBULATORY_CARE_PROVIDER_SITE_OTHER): Payer: 59 | Admitting: Internal Medicine

## 2020-04-13 ENCOUNTER — Other Ambulatory Visit: Payer: Self-pay

## 2020-04-13 ENCOUNTER — Telehealth: Payer: Self-pay | Admitting: *Deleted

## 2020-04-13 VITALS — BP 170/88 | HR 97 | Ht 64.0 in | Wt 230.4 lb

## 2020-04-13 DIAGNOSIS — K2270 Barrett's esophagus without dysplasia: Secondary | ICD-10-CM

## 2020-04-13 DIAGNOSIS — K219 Gastro-esophageal reflux disease without esophagitis: Secondary | ICD-10-CM | POA: Diagnosis not present

## 2020-04-13 DIAGNOSIS — Z86010 Personal history of colonic polyps: Secondary | ICD-10-CM

## 2020-04-13 MED ORDER — PANTOPRAZOLE SODIUM 40 MG PO TBEC: 40.0000 mg | DELAYED_RELEASE_TABLET | Freq: Every day | ORAL | 3 refills | Status: DC

## 2020-04-13 NOTE — Progress Notes (Signed)
Patient ID: Julie Prince, female   DOB: 21-Jan-1961, 60 y.o.   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  She was seen just over 3 years ago for colonoscopy surveillance.  She is here alone today.  She reports that she has been doing well though recently she lost her youngest brother who died suddenly in [place] of unknown cause.  From a GI perspective she is not having heartburn but having acid indigestion in the epigastrium.  No nausea or vomiting.  She has noticed when she eats or drinks that food will "stick" in her lower esophagus.  She is now off of PPI.  She is using Pepcid AC as needed.  Bowel movements for her been regular occurring about every 2 days.  On one occasion in the last 6 months she saw mucus and a "string of blood" with wiping.  No blood in stool or melena.  No abdominal pain.  No ongoing issues with diarrhea or constipation.  Past Medical History:  Diagnosis Date  . Allergy   . Barrett esophagus   . Cataract    left cataract removal  . Diverticulosis   . Essential hypertension, benign 09/12/2013  . GERD (gastroesophageal reflux disease)   . Hiatal hernia   . Hyperlipidemia 09/12/2013  . 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 Peripheral vascular disease ([place])   . PONV (postoperative nausea and vomiting)   . Type 2 diabetes mellitus ([place]) 09/12/2013   Dr. Posey Pronto at [place]   . Wears partial dentures    upper    Past Surgical History:  Procedure Laterality Date  . ABDOMINAL AORTOGRAM W/LOWER EXTREMITY N/A 06/06/2018   Procedure: ABDOMINAL AORTOGRAM W/LOWER EXTREMITY;  Surgeon: Wellington Hampshire, MD;  Location: [facility];  Service: Cardiovascular;  Laterality: N/A;  . ABDOMINAL AORTOGRAM W/LOWER EXTREMITY N/A 09/18/2019    Procedure: ABDOMINAL AORTOGRAM W/LOWER EXTREMITY;  Surgeon: Wellington Hampshire, MD;  Location: [facility];  Service: Cardiovascular;  Laterality: N/A;  . AMPUTATION Left 01/23/2020   Procedure: LEFT FIFTH TOE AMPUTATION;  Surgeon: Waynetta Sandy, MD;  Location: [facility];  Service: Vascular;  Laterality: Left;  . APPENDECTOMY    . CATARACT EXTRACTION Left   . CESAREAN SECTION  1990  . COLONOSCOPY    . FEMORAL-POPLITEAL BYPASS GRAFT Left 11/12/2019   Procedure: BYPASS GRAFT FEMORAL-ABOVE KNEE POPLITEAL ARTERY;  Surgeon: Waynetta Sandy, MD;  Location: [facility];  Service: Vascular;  Laterality: Left;  . right tube and ovary removed    . WISDOM TOOTH EXTRACTION      Outpatient Medications Prior to Visit  Medication Sig Dispense Refill  . aspirin EC 81 MG tablet Take 81 mg by mouth in the morning. Swallow whole.    Marland Kitchen atorvastatin (LIPITOR) 40 MG tablet TAKE 1 TABLET BY MOUTH AT BEDTIME 90 tablet 2  . cilostazol (PLETAL) 50 MG tablet Take 1 tablet (50 mg total) by mouth 2 (two) times daily. 180 tablet 3  . Continuous Blood Gluc Sensor (FREESTYLE LIBRE 14 DAY SENSOR) MISC TEST DAILY AS DIRECTED AND CHANGE EVERY 14 DAYS 2 each 0  . metoprolol tartrate (LOPRESSOR) 50 MG tablet Take 1.5 tablets (75 mg total) by mouth 2 (two) times daily. (Patient taking differently: Take 50 mg by mouth 2 (two) times daily.) 270 tablet 1  .  Semaglutide,0.25 or 0.5MG /DOS, (OZEMPIC, 0.25 OR 0.5 MG/DOSE,) 2 MG/1.5ML SOPN Inject 0.25 mg [place] once weekly for 4 weeks. Then 0.5 mg [place] once weekly. (Patient taking differently: Inject 0.5 mg into the skin every 7 (seven) days. On Tuesdays) 1.5 mL 3  . TRESIBA FLEXTOUCH 100 UNIT/ML FlexTouch Pen INJECT 50 UNITS SUBCUTANEOUSLY AT BEDTIME 15 mL 0   No facility-administered medications prior to visit.    Allergies  Allergen Reactions  . Penicillins Hives and Shortness Of Breath  . Metformin And Related Diarrhea    Family History  Problem Relation Age of  Onset  . Lung cancer Mother   . Stroke Father   . Heart disease Maternal Aunt   . Fibromyalgia Sister   . Alzheimer's disease Maternal Grandmother   . Colon cancer Neg Hx   . Esophageal cancer Neg Hx   . Rectal cancer Neg Hx   . Stomach cancer Neg Hx   . Pancreatic cancer Neg Hx     Social History   Tobacco Use  . Smoking status: Former Smoker    Packs/day: 0.50    Quit date: 11/07/2000    Years since quitting: 19.4  . Smokeless tobacco: Never Used  . Tobacco comment: quit 30 plus years ago.  Vaping Use  . Vaping Use: Never used  Substance Use Topics  . Alcohol use: No    Alcohol/week: 0.0 standard drinks  . Drug use: No    ROS: As per history of present illness, otherwise negative  BP (!) 170/88   Pulse 97   Ht 5\' 4"  (1.626 m)   Wt 230 lb 6.4 oz (104.5 kg)   LMP  (LMP Unknown)   SpO2 99%   BMI 39.55 kg/m  Constitutional: Well-developed and well-nourished. No distress. HEENT: Normocephalic and atraumatic. Conjunctivae are normal.  No scleral icterus. Neck: Neck supple. Trachea midline. Cardiovascular: Normal rate, regular rhythm and intact distal pulses. No M/R/G Pulmonary/chest: Effort normal and breath sounds normal. No wheezing, rales or rhonchi. Abdominal: Soft, obese, nontender, nondistended. Bowel sounds active throughout. There are no masses palpable. Extremities: no clubbing, cyanosis, or edema Neurological: Alert and oriented to person place and time. Skin: Skin is warm and dry.  Psychiatric: Normal mood and affect. Behavior is normal.  RELEVANT LABS AND IMAGING: CBC    Component Value Date/Time   WBC 6.7 03/12/2020 1104   WBC 6.8 01/23/2020 0818   RBC 4.41 03/12/2020 1104   RBC 4.44 01/23/2020 0818   HGB 13.7 03/12/2020 1104   HCT 40.1 03/12/2020 1104   PLT 270 03/12/2020 1104   MCV 91 03/12/2020 1104   MCH 31.1 03/12/2020 1104   MCH 30.6 01/23/2020 0818   MCHC 34.2 03/12/2020 1104   MCHC 32.1 01/23/2020 0818   RDW 13.4 03/12/2020 1104    LYMPHSABS 2.3 03/12/2020 1104   MONOABS 0.9 08/16/2019 0753   EOSABS 0.3 03/12/2020 1104   BASOSABS 0.0 03/12/2020 1104    CMP     Component Value Date/Time   NA 146 (H) 03/12/2020 1104   K 5.0 03/12/2020 1104   CL 106 03/12/2020 1104   CO2 26 03/12/2020 1104   GLUCOSE 81 03/12/2020 1104   GLUCOSE 91 01/23/2020 0818   BUN 11 03/12/2020 1104   CREATININE 0.94 03/12/2020 1104   CREATININE 1.07 (H) 04/26/2016 1412   CALCIUM 9.4 03/12/2020 1104   PROT 7.2 03/12/2020 1104   ALBUMIN 4.2 03/12/2020 1104   AST 13 03/12/2020 1104   ALT 14 03/12/2020 1104  ALKPHOS 111 03/12/2020 1104   BILITOT 0.3 03/12/2020 1104   GFRNONAA 67 03/12/2020 1104   GFRNONAA >60 01/23/2020 0818   GFRNONAA 59 (L) 04/26/2016 1412   GFRAA 77 03/12/2020 1104   GFRAA 68 04/26/2016 1412    ASSESSMENT/PLAN: 60 year old female with a past medical history of GERD with Barrett's esophagus without dysplasia, 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   1. GERD with hx of short segment Barrett's esophagus without dysplasia --she is having acid indigestion symptoms which are more dyspeptic than esophageal.  Though, she has had some mild dysphagia symptom likely related to reflux esophagitis given her lack of acid suppressing therapy.  She was previously on pantoprazole and the symptom had resolved.  She is due for endoscopic surveillance of her Barrett's.  I recommended the following: --Resume pantoprazole 40 mg daily --Upper endoscopy in the [place]; we discussed the risk, benefits and alternatives and she is agreeable and wishes to proceed  Hold Pletal 5 days before procedure - will instruct when and how to resume after procedure. Risks and benefits of procedure including bleeding, perforation, infection, missed lesions, medication reactions and possible hospitalization or surgery if complications occur explained. Additional rare but real risk of  cardiovascular event such as heart attack or ischemia/infarct of other organs off Pletal explained and need to seek urgent help if this occurs. Will communicate by phone or EMR with patient's prescribing provider that to confirm holding Pletal is reasonable in this case.    2.  History of adenoma with high-grade dysplasia of the rectum --high-grade dysplasia and a rectal polyp in 2016 noted to be fully excised at flexible sigmoidoscopy in summer 2016.  She is up-to-date with her colonoscopic screening/surveillance with a colonoscopy 3 years ago which was normal with the exception of diverticulosis and hypertrophied anal papillae.  3.  Isolated mucus in stool and heme with wiping --only one episode.  Likely hemorrhoidal in nature.  No change in bowel habit.  She is up-to-date with screening as discussed in #2.  If this recurs she is asked to let me know.      KP:[facility], [place], [address] [place],  [place] [address]

## 2020-04-13 NOTE — Patient Instructions (Signed)
We have sent the following medications to your pharmacy for you to pick up at your convenience: Pantoprazole 40 mg once daily  You have been scheduled for an endoscopy. Please follow written instructions given to you at your visit today. If you use inhalers (even only as needed), please bring them with you on the day of your procedure.  If you are age 60 or older, your body mass index should be between 23-30. Your Body mass index is 39.55 kg/m. If this is out of the aforementioned range listed, please consider follow up with your Primary Care Provider.  If you are age 49 or younger, your body mass index should be between 19-25. Your Body mass index is 39.55 kg/m. If this is out of the aformentioned range listed, please consider follow up with your Primary Care Provider.   Due to recent changes in healthcare laws, you may see the results of your imaging and laboratory studies on MyChart before your provider has had a chance to review them.  We understand that in some cases there may be results that are confusing or concerning to you. Not all laboratory results come back in the same time frame and the provider may be waiting for multiple results in order to interpret others.  Please give [name] 48 hours in order for your provider to thoroughly review all the results before contacting the office for clarification of your results.

## 2020-04-13 NOTE — Telephone Encounter (Signed)
Request for surgical clearance:     Endoscopy Procedure  What type of surgery is being performed?     endoscopy  When is this surgery scheduled?     05/14/20  What type of clearance is required ?   Pharmacy  Are there any medications that need to be held prior to surgery and how long? Pletal, 5 days  Practice name and name of physician performing surgery?      [facility]  What is your office phone and fax number?      Phone- [phone]  [phone]  Anesthesia type (None, local, MAC, general) ?       MAC

## 2020-04-14 NOTE — Telephone Encounter (Signed)
Julie Prince 60 year old female was last seen in the clinic on 12/17/2019.  She had undergone left common femoral popliteal artery bypass.  She reported resolution of her left calf claudication.  She continued to receive wound care for her left small toe.  She indicated that she had improved heart rate with metoprolol and her blood pressure was well controlled.  Her PMH also includes essential hypertension, hyperlipidemia, diabetes, PAD, and inappropriate tachycardia.   She is requesting an EGD.  May her Pletal be held prior to her procedure?  Thank you for your help.  Please direct response to CV [facility] preop pool.  Julie Prince. Julie Justin NP-C    04/14/2020, 9:10 AM [place] Office [phone] Fax [phone]

## 2020-04-16 NOTE — Telephone Encounter (Signed)
   Primary Cardiologist: Skeet Latch, MD  Chart reviewed as part of pre-operative protocol coverage. Given past medical history and time since last visit, based on ACC/AHA guidelines, Julie Prince would be at acceptable risk for the planned procedure without further cardiovascular testing.   Her Pletal may be held for 3 days before procedure.  Please resume as soon as hemostasis is achieved.  I will route this recommendation to the requesting party via Epic fax function and remove from pre-op pool.  Please call with questions.  Julie Prince. Julie Derasmo NP-C    04/16/2020, 7:41 AM [place] [address] Office [phone] Fax [phone]

## 2020-04-16 NOTE — Telephone Encounter (Signed)
Dr Hilarie Fredrickson, patient's cardiologist has recommended 3 day hold of Pletal for procedure rather than 5 day hold. Is this ok?  Left message for patient to call back.

## 2020-04-16 NOTE — Telephone Encounter (Signed)
yes

## 2020-04-16 NOTE — Telephone Encounter (Signed)
Hold Pletal 3 days before

## 2020-04-16 NOTE — Telephone Encounter (Signed)
I have spoken to patient to advise that per cardiology, she may hold pletal THREE days prior to her upcoming procedure. Patient verbalizes clear understanding of these instructions.

## 2020-04-21 ENCOUNTER — Other Ambulatory Visit: Payer: Self-pay | Admitting: Physician Assistant

## 2020-04-21 DIAGNOSIS — E1165 Type 2 diabetes mellitus with hyperglycemia: Secondary | ICD-10-CM

## 2020-04-22 ENCOUNTER — Other Ambulatory Visit: Payer: Self-pay | Admitting: Physician Assistant

## 2020-04-22 DIAGNOSIS — E1165 Type 2 diabetes mellitus with hyperglycemia: Secondary | ICD-10-CM

## 2020-04-25 ENCOUNTER — Other Ambulatory Visit: Payer: Self-pay | Admitting: Physician Assistant

## 2020-04-25 DIAGNOSIS — E1165 Type 2 diabetes mellitus with hyperglycemia: Secondary | ICD-10-CM

## 2020-05-14 ENCOUNTER — Encounter: Payer: 59 | Admitting: Internal Medicine

## 2020-05-15 ENCOUNTER — Ambulatory Visit (INDEPENDENT_AMBULATORY_CARE_PROVIDER_SITE_OTHER)
Admission: RE | Admit: 2020-05-15 | Discharge: 2020-05-15 | Disposition: A | Discharge status: Home / Self Care | Payer: 59 | Source: Ambulatory Visit | Attending: Vascular Surgery | Admitting: Vascular Surgery

## 2020-05-15 ENCOUNTER — Other Ambulatory Visit: Payer: Self-pay

## 2020-05-15 ENCOUNTER — Encounter: Payer: Self-pay | Admitting: Physician Assistant

## 2020-05-15 ENCOUNTER — Ambulatory Visit (HOSPITAL_COMMUNITY)
Admission: RE | Admit: 2020-05-15 | Discharge: 2020-05-15 | Disposition: A | Discharge status: Home / Self Care | Payer: 59 | Source: Ambulatory Visit | Attending: Vascular Surgery | Admitting: Vascular Surgery

## 2020-05-15 ENCOUNTER — Ambulatory Visit (INDEPENDENT_AMBULATORY_CARE_PROVIDER_SITE_OTHER): Payer: 59 | Admitting: Physician Assistant

## 2020-05-15 ENCOUNTER — Other Ambulatory Visit: Payer: Self-pay | Admitting: Physician Assistant

## 2020-05-15 VITALS — BP 176/88 | HR 88 | Temp 98.6°F | Resp 20 | Ht 64.0 in | Wt 234.1 lb

## 2020-05-15 DIAGNOSIS — I739 Peripheral vascular disease, unspecified: Secondary | ICD-10-CM

## 2020-05-15 DIAGNOSIS — S98132A Complete traumatic amputation of one left lesser toe, initial encounter: Secondary | ICD-10-CM

## 2020-05-15 NOTE — Progress Notes (Signed)
Office Note     CC:  follow up Requesting Provider:  [name], PA-C  HPI: Julie Prince is a 60 y.o. (1960-11-28) female who presents for follow up of peripheral artery disease. She has extensive surgical history most recently having undergone left 5th toe amputation. Prior to that she had a left femoral to above knee popliteal vein bypass on 11/12/19 by Dr. Donzetta Matters. This was performed due to ulceration of her 5th toe. She was doing well at the time of her post op visit in November. She was using Darco shoe for weight bearing. She had no lower extremity claudication or rest pain and no new wounds. She was compliant with her aspirin, statin and Pletal.   She presents today for 3 month follow up with non invasive studies. She overall is doing well. She says she is walking at least 30 minutes a day. She does have continued swelling of left greater than right lower extremity. She does elevate regularly which helps. She has tried compression stockings but states that she feels it makes her leg more swollen so she only wears them intermittently. Otherwise her 5th toe amputation site has healed well. She has no new wounds. She denies any claudication or rest pain. She continues to take her Aspirin, statin and Pletal  The pt is on a statin for cholesterol management.  The pt is on a daily aspirin.   Other AC: none The pt is on BB for hypertension.   The pt is diabetic.   Tobacco hx:  Former smoker, 2002  Past Medical History:  Diagnosis Date  . Allergy   . Barrett esophagus   . Cataract    left cataract removal  . Diverticulosis   . Essential hypertension, benign 09/12/2013  . GERD (gastroesophageal reflux disease)   . Hiatal hernia   . Hyperlipidemia 09/12/2013  . 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 Peripheral vascular disease ([place])   . PONV (postoperative nausea and vomiting)   . Type 2 diabetes mellitus ([place]) 09/12/2013   Dr. Posey Pronto at [place]   . Wears partial  dentures    upper    Past Surgical History:  Procedure Laterality Date  . ABDOMINAL AORTOGRAM W/LOWER EXTREMITY N/A 06/06/2018   Procedure: ABDOMINAL AORTOGRAM W/LOWER EXTREMITY;  Surgeon: Wellington Hampshire, MD;  Location: [facility];  Service: Cardiovascular;  Laterality: N/A;  . ABDOMINAL AORTOGRAM W/LOWER EXTREMITY N/A 09/18/2019   Procedure: ABDOMINAL AORTOGRAM W/LOWER EXTREMITY;  Surgeon: Wellington Hampshire, MD;  Location: [facility];  Service: Cardiovascular;  Laterality: N/A;  . AMPUTATION Left 01/23/2020   Procedure: LEFT FIFTH TOE AMPUTATION;  Surgeon: Waynetta Sandy, MD;  Location: [facility];  Service: Vascular;  Laterality: Left;  . APPENDECTOMY    . CATARACT EXTRACTION Left   . CESAREAN SECTION  1990  . COLONOSCOPY    . FEMORAL-POPLITEAL BYPASS GRAFT Left 11/12/2019   Procedure: BYPASS GRAFT FEMORAL-ABOVE KNEE POPLITEAL ARTERY;  Surgeon: Waynetta Sandy, MD;  Location: [facility];  Service: Vascular;  Laterality: Left;  . right tube and ovary removed    . WISDOM TOOTH EXTRACTION      Social History   Socioeconomic History  . Marital status: Single    Spouse name: Not on file  . Number of children: Not on file  . Years of education: Not on file  . Highest education level: Not on file  Occupational History  . Not on file  Tobacco Use  . Smoking status: Former  Smoker    Packs/day: 0.50    Quit date: 11/07/2000    Years since quitting: 19.5  . Smokeless tobacco: Never Used  . Tobacco comment: quit 30 plus years ago.  Vaping Use  . Vaping Use: Never used  Substance and Sexual Activity  . Alcohol use: No    Alcohol/week: 0.0 standard drinks  . Drug use: No  . Sexual activity: Yes    Birth control/protection: Post-menopausal  Other Topics Concern  . Not on file  Social History Narrative  . Not on file   Social Determinants of Health   Financial Resource Strain: Not on file  Food Insecurity: Not on file  Transportation Needs: Not on  file  Physical Activity: Not on file  Stress: Not on file  Social Connections: Not on file  Intimate Partner Violence: Not on file    Family History  Problem Relation Age of Onset  . Lung cancer Mother   . Stroke Father   . Heart disease Maternal Aunt   . Fibromyalgia Sister   . Alzheimer's disease Maternal Grandmother   . Colon cancer Neg Hx   . Esophageal cancer Neg Hx   . Rectal cancer Neg Hx   . Stomach cancer Neg Hx   . Pancreatic cancer Neg Hx     Current Outpatient Medications  Medication Sig Dispense Refill  . aspirin EC 81 MG tablet Take 81 mg by mouth in the morning. Swallow whole.    Marland Kitchen atorvastatin (LIPITOR) 40 MG tablet TAKE 1 TABLET BY MOUTH AT BEDTIME 90 tablet 2  . cilostazol (PLETAL) 50 MG tablet Take 1 tablet (50 mg total) by mouth 2 (two) times daily. 180 tablet 3  . Continuous Blood Gluc Sensor (FREESTYLE LIBRE 14 DAY SENSOR) MISC USE AS DIRECTED EVERY  14  DAYS 2 each 0  . metoprolol tartrate (LOPRESSOR) 50 MG tablet Take 1.5 tablets (75 mg total) by mouth 2 (two) times daily. (Patient taking differently: Take 50 mg by mouth 2 (two) times daily.) 270 tablet 1  . pantoprazole (PROTONIX) 40 MG tablet Take 1 tablet (40 mg total) by mouth daily. 90 tablet 3  . Semaglutide,0.25 or 0.5MG /DOS, (OZEMPIC, 0.25 OR 0.5 MG/DOSE,) 2 MG/1.5ML SOPN INJECT 0.25MG  SUBCUTANOUSLY ONCE WEEKLY FOR 4 WEEKS THEN INJECT 0.5MG  ONCE WEEKLY 2 mL 0  . TRESIBA FLEXTOUCH 100 UNIT/ML FlexTouch Pen INJECT 50 UNITS SUBCUTANEOUSLY AT BEDTIME 15 mL 0   No current facility-administered medications for this visit.    Allergies  Allergen Reactions  . Penicillins Hives and Shortness Of Breath  . Metformin And Related Diarrhea     REVIEW OF SYSTEMS:  [X]  denotes positive finding, [ ]  denotes negative finding Cardiac  Comments:  Chest pain or chest pressure:    Shortness of breath upon exertion:    Short of breath when lying flat:    Irregular heart rhythm:        Vascular    Pain in  calf, thigh, or hip brought on by ambulation:    Pain in feet at night that wakes you up from your sleep:     Blood clot in your veins:    Leg swelling:         Pulmonary    Oxygen at home:    Productive cough:     Wheezing:         Neurologic    Sudden weakness in arms or legs:     Sudden numbness in arms or legs:  Sudden onset of difficulty speaking or slurred speech:    Temporary loss of vision in one eye:     Problems with dizziness:         Gastrointestinal    Blood in stool:     Vomited blood:         Genitourinary    Burning when urinating:     Blood in urine:        Psychiatric    Major depression:         Hematologic    Bleeding problems:    Problems with blood clotting too easily:        Skin    Rashes or ulcers:        Constitutional    Fever or chills:      PHYSICAL EXAMINATION:  Vitals:   05/15/20 1100  BP: (!) 176/88  Pulse: 88  Resp: 20  Temp: 98.6 F (37 C)  TempSrc: Temporal  SpO2: 100%  Weight: 234 lb 1.6 oz (106.2 kg)  Height: 5\' 4"  (1.626 m)    General:  WDWN in NAD; vital signs documented above Gait: Normal HENT: WNL, normocephalic Pulmonary: normal non-labored breathing , without wheezing Cardiac: regular HR, without  Murmurs without carotid bruit Abdomen: soft, NT, no masses Vascular Exam/Pulses:  Right Left  Radial 2+ (normal) 2+ (normal)  Femoral 2+ (normal) 2+ (normal)  Popliteal Not palpable Not palpable  DP Not palpable 2+ (normal)  PT Not palpable Not palpable   Extremities: without ischemic changes, without Gangrene , without cellulitis; without open wounds; LLE swelling present  Musculoskeletal: no muscle wasting or atrophy  Neurologic: A&O X 3;  No focal weakness or paresthesias are detected Psychiatric:  The pt has Normal affect.   Non-Invasive Vascular Imaging:   +-------+-----------+-----------+------------+------------+  ABI/TBIToday's ABIToday's TBIPrevious ABIPrevious TBI   +-------+-----------+-----------+------------+------------+  Right 0.53    0.24        0.63      0.21    +-------+-----------+-----------+------------+------------+  Left  0.97    0.54        0.98    0.16 +-------+-----------+-----------+------------+------------+   Independently reviewed left lower extremity bypass graft duplex which showed patient femoral to above knee popliteal duplex. Did note some swelling which limited visualization of distal anastomosis. Biphasic and triphasic flow throughout graft    ASSESSMENT/PLAN:: 60 y.o. female here for follow up for peripheral artery disease. She has extensive surgical history most recently having undergone left 5th toe amputation. Prior to that she had a left femoral to above knee popliteal vein bypass on 11/12/19 by Dr. Donzetta Matters. This was performed due to ulceration of her 5th toe. Her amputation site has healed nicely. No new wounds. No rest pain or claudication. -  ABI/ TBI monophasic flow bilaterally. This is unchanged from prior studies. Overall her ABI and TBI are essentially unchanged -Her arterial duplex today shows patent LLE vein bypass graft - She will continue on her Aspirin, statin and Pletal - Encourage her to continue to elevate and wear compression stocking PRN - she will follow up in 6 months with repeat ABI and LLE bypass graft duplex  Karoline Caldwell, PA-C [facility] [phone]  Clinic MD:  Dr. Donzetta Matters

## 2020-05-20 ENCOUNTER — Other Ambulatory Visit: Payer: Self-pay

## 2020-05-20 DIAGNOSIS — I739 Peripheral vascular disease, unspecified: Secondary | ICD-10-CM

## 2020-05-22 ENCOUNTER — Telehealth: Payer: Self-pay | Admitting: Cardiovascular Disease

## 2020-05-22 NOTE — Telephone Encounter (Signed)
Please review for refill. Thanks!  

## 2020-05-22 NOTE — Telephone Encounter (Signed)
*  STAT* If patient is at the pharmacy, call can be transferred to refill team.   1. Which medications need to be refilled? (please list name of each medication and dose if known)    Metoprolol 50 mg po BID    2. Which pharmacy/location (including street and city if local pharmacy) is medication to be sent to? [facility]     3. Do they need a 30 day or 90 day supply?  [facility]

## 2020-05-25 MED ORDER — METOPROLOL TARTRATE 50 MG PO TABS: 50.0000 mg | ORAL_TABLET | Freq: Two times a day (BID) | ORAL | 3 refills | Status: DC

## 2020-05-25 NOTE — Telephone Encounter (Signed)
Patient calling to check on status.

## 2020-05-25 NOTE — Telephone Encounter (Signed)
Called patient to confirm location and address for new pharmacy. Patient verbalized understanding of how to take her medication and thanked me for returning her call.

## 2020-05-25 NOTE — Addendum Note (Signed)
Addended by: Jacqulynn Cadet on: 05/25/2020 04:46 PM   Modules accepted: Orders

## 2020-06-05 ENCOUNTER — Other Ambulatory Visit: Payer: Self-pay

## 2020-06-05 ENCOUNTER — Telehealth: Payer: Self-pay | Admitting: Internal Medicine

## 2020-06-05 ENCOUNTER — Telehealth: Payer: Self-pay | Admitting: Physician Assistant

## 2020-06-05 DIAGNOSIS — E1165 Type 2 diabetes mellitus with hyperglycemia: Secondary | ICD-10-CM

## 2020-06-05 MED ORDER — FREESTYLE LIBRE 14 DAY SENSOR MISC: 0 refills | Status: DC

## 2020-06-05 NOTE — Telephone Encounter (Signed)
rx sent to requested pharmacy. AS, CMA

## 2020-06-05 NOTE — Telephone Encounter (Signed)
Called and left a voice message for the patient to give me a call to confirm directions of medication.

## 2020-06-05 NOTE — Telephone Encounter (Addendum)
Patient Rosine Abe Mefferd returned my call. Patient was asked about her Metoprolol Tartrate (Lopressor). I informed patient that the directions state that she is take 1.5 tablets 75 mg 2 times a day and that at her last office visit thos were the directions as well that it says she takes. 50 mg 2 times a day. Mrs. Kasel stated that she called in this medication before and told them that it is 50 mg 2 times a day. She confirmed that she takes 1 tablet (50 mg) 2 times a day. I asked if she could grab her medication bottle and tell me what it says. Bottle direction says to take 1.5 tablets (75 mg) by mouth 2 times a day. Mrs. Manfredonia stated that her PCP was originally giving her the medication and told her that she had to call Dr. Tyrell Antonio office for him to refill since he handles her heart. I reverified with the patient how she takes it and she again stated that she takes 1 tablet (50 mg) 2 times a day. I informed patient I will route this to Dr. Fletcher Anon and his nurse and myself or someone else will give her a call back later today. She thanked me for calling and stated that she apologizes for any confusion.

## 2020-06-05 NOTE — Telephone Encounter (Signed)
Left voicemail for patient to call back. 

## 2020-06-05 NOTE — Telephone Encounter (Signed)
please call pt, she has some questions about medications before her procedure on 3/22.

## 2020-06-05 NOTE — Telephone Encounter (Signed)
Pt's medication Metoprolol Tartrate 50 mg tablets, was sent into pharmacy with wrong directions. LOV on 12/17/19, pt takes this medication metoprolol 50 mg tablets, 1 1/2 tablets BID. Please resend pt's medication into [facility] on [address]. Please address

## 2020-06-05 NOTE — Telephone Encounter (Signed)
Patient asking when she needs to go for COVID screening for upcoming endoscopy in [place]. I advised she no longer needs to do this as long as she is symptom free as we recently stopped this requirement. Patient verbalizes understanding (she rescheduled from a previous date when this was required). In addition, she asks again how long she should hold her pletal. I advised that per cardiology, she may hold it 3 days prior to her upcoming procedure. She verbalizes understanding.

## 2020-06-05 NOTE — Telephone Encounter (Signed)
Patient's daughter Julie Prince who is on the patient's DPR called the office. She stated that she called the office after speaking with her mother who was worried that we will not be getting her medications. Mrs. Julie Prince stated that she handles her mothers medications and that she does take the Lopressor as 50 mg 2 times a day, that it was discussed last year with Dr. Fletcher Anon at an office visit and he was okay with that. She also confirmed that her mother has moved and she will no longer use the [facility] in [place] to use the [facility] on [address] in [place], [place]. I thank her for calling and that I will take the [place] out and keep the new one on file for her and her mother. I apologized for any confusion and again thanked her for calling to verify the medication, route and dose.  I removed the [place] listed for [facility] from patient's chart at the request of her daughter Julie Prince.

## 2020-06-05 NOTE — Telephone Encounter (Signed)
Pt is returning a missed call.

## 2020-06-09 ENCOUNTER — Ambulatory Visit (AMBULATORY_SURGERY_CENTER): Payer: 59 | Admitting: Internal Medicine

## 2020-06-09 ENCOUNTER — Other Ambulatory Visit: Payer: Self-pay

## 2020-06-09 ENCOUNTER — Encounter: Payer: Self-pay | Admitting: Internal Medicine

## 2020-06-09 VITALS — BP 152/75 | HR 78 | Temp 95.5°F | Resp 15 | Ht 64.0 in | Wt 230.0 lb

## 2020-06-09 DIAGNOSIS — K2270 Barrett's esophagus without dysplasia: Secondary | ICD-10-CM

## 2020-06-09 DIAGNOSIS — K219 Gastro-esophageal reflux disease without esophagitis: Secondary | ICD-10-CM

## 2020-06-09 MED ORDER — SODIUM CHLORIDE 0.9 % IV SOLN: 500.0000 mL | Freq: Once | INTRAVENOUS | Status: DC

## 2020-06-09 NOTE — Progress Notes (Signed)
Called to room to assist during endoscopic procedure.  Patient ID and intended procedure confirmed with present staff. Received instructions for my participation in the procedure from the performing physician.  

## 2020-06-09 NOTE — Progress Notes (Signed)
pt tolerated well. VSS. awake and to recovery. Report given to RN. Bite block insitu to recovery. 

## 2020-06-09 NOTE — Progress Notes (Signed)
VS by [place]  I have reviewed the patient's medical history in detail and updated the computerized patient record.  

## 2020-06-09 NOTE — Op Note (Signed)
[facility] Patient Name: Julie Prince Procedure Date: 06/09/2020 10:16 AM MRN: [id number] Endoscopist: Jerene Bears , MD Age: 60 Referring MD:  Date of Birth: 08-06-1960 Gender: Female Account #: [id number] Procedure:                Upper GI endoscopy Indications:              Follow-up of Barrett's esophagus with last                            endoscopy January 2017, history of GERD, prior                            dysphagia resolved with reinduction of PPI Medicines:                Monitored Anesthesia Care Procedure:                Pre-Anesthesia Assessment:                           - Prior to the procedure, a History and Physical                            was performed, and patient medications and                            allergies were reviewed. The patient's tolerance of                            previous anesthesia was also reviewed. The risks                            and benefits of the procedure and the sedation                            options and risks were discussed with the patient.                            All questions were answered, and informed consent                            was obtained. Prior Anticoagulants: The patient has                            taken Pletal (cilostazol), last dose was 5 days                            prior to procedure. ASA Grade Assessment: III - A                            patient with severe systemic disease. After                            reviewing the risks and benefits, the patient was  deemed in satisfactory condition to undergo the                            procedure.                           After obtaining informed consent, the endoscope was                            passed under direct vision. Throughout the                            procedure, the patient's blood pressure, pulse, and                            oxygen saturations were monitored continuously. The                             Endoscope was introduced through the mouth, and                            advanced to the second part of duodenum. The upper                            GI endoscopy was accomplished without difficulty.                            The patient tolerated the procedure well. Scope In: Scope Out: Findings:                 The Z-line was irregular and was found 37 cm from                            the incisors. Very small islands at a short tongue                            of salmon-colored mucosa (1 cm) biopsies were taken                            with a cold forceps for histology and to exclude                            Barrett's esophagus.                           A 3 cm hiatal hernia was found. The proximal extent                            of the gastric folds (end of tubular esophagus) was                            37 cm from the incisors. The hiatal narrowing was  40 cm from the incisors.                           The entire examined stomach was normal.                           The examined duodenum was normal. Complications:            No immediate complications. Estimated Blood Loss:     Estimated blood loss was minimal. Impression:               - Z-line irregular, 37 cm from the incisors.                            Biopsied.                           - 3 cm hiatal hernia.                           - Normal stomach.                           - Normal examined duodenum. Recommendation:           - Patient has a contact number available for                            emergencies. The signs and symptoms of potential                            delayed complications were discussed with the                            patient. Return to normal activities tomorrow.                            Written discharge instructions were provided to the                            patient.                           - Resume previous diet.                            - Continue present medications including daily                            pantoprazole.                           - Await pathology results.                           - Resume Pletal (cilostazol) at prior dose                            tomorrow. Refer  to managing physician for further                            adjustment of therapy. Jerene Bears, MD 06/09/2020 10:33:36 AM This report has been signed electronically.

## 2020-06-09 NOTE — Patient Instructions (Signed)
  Resume Pletal at at prior dose today.    YOU HAD AN ENDOSCOPIC PROCEDURE TODAY AT [facility]:   Refer to the procedure report that was given to you for any specific questions about what was found during the examination.  If the procedure report does not answer your questions, please call your gastroenterologist to clarify.  If you requested that your care partner not be given the details of your procedure findings, then the procedure report has been included in a sealed envelope for you to review at your convenience later.  YOU SHOULD EXPECT: Some feelings of bloating in the abdomen. Passage of more gas than usual.  Walking can help get rid of the air that was put into your GI tract during the procedure and reduce the bloating. If you had a lower endoscopy (such as a colonoscopy or flexible sigmoidoscopy) you may notice spotting of blood in your stool or on the toilet paper. If you underwent a bowel prep for your procedure, you may not have a normal bowel movement for a few days.  Please Note:  You might notice some irritation and congestion in your nose or some drainage.  This is from the oxygen used during your procedure.  There is no need for concern and it should clear up in a day or so.  SYMPTOMS TO REPORT IMMEDIATELY:    Following upper endoscopy (EGD)  Vomiting of blood or coffee ground material  New chest pain or pain under the shoulder blades  Painful or persistently difficult swallowing  New shortness of breath  Fever of 100F or higher  Black, tarry-looking stools  For urgent or emergent issues, a gastroenterologist can be reached at any hour by calling [phone]. Do not use MyChart messaging for urgent concerns.    DIET:  We do recommend a small meal at first, but then you may proceed to your regular diet.  Drink plenty of fluids but you should avoid alcoholic beverages for 24 hours.  ACTIVITY:  You should plan to take it easy for the rest of today and  you should NOT DRIVE or use heavy machinery until tomorrow (because of the sedation medicines used during the test).    FOLLOW UP: Our staff will call the number listed on your records 48-72 hours following your procedure to check on you and address any questions or concerns that you may have regarding the information given to you following your procedure. If we do not reach you, we will leave a message.  We will attempt to reach you two times.  During this call, we will ask if you have developed any symptoms of COVID 19. If you develop any symptoms (ie: fever, flu-like symptoms, shortness of breath, cough etc.) before then, please call [phone].  If you test positive for Covid 19 in the 2 weeks post procedure, please call and report this information to [name].    If any biopsies were taken you will be contacted by phone or by letter within the next 1-3 weeks.  Please call us at [phone] if you have not heard about the biopsies in 3 weeks.    SIGNATURES/CONFIDENTIALITY: You and/or your care partner have signed paperwork which will be entered into your electronic medical record.  These signatures attest to the fact that that the information above on your After Visit Summary has been reviewed and is understood.  Full responsibility of the confidentiality of this discharge information lies with you and/or your care-partner.

## 2020-06-10 ENCOUNTER — Ambulatory Visit: Payer: 59 | Admitting: Physician Assistant

## 2020-06-11 ENCOUNTER — Telehealth: Payer: Self-pay | Admitting: *Deleted

## 2020-06-11 NOTE — Telephone Encounter (Signed)
  Follow up Call-  Call back number 06/09/2020  Post procedure Call Back phone  # [phone]  Permission to leave phone message Yes  Some recent data might be hidden     Patient questions:  Do you have a fever, pain , or abdominal swelling? No. Pain Score  0 *  Have you tolerated food without any problems? Yes.    Have you been able to return to your normal activities? Yes.    Do you have any questions about your discharge instructions: Diet   No. Medications  No. Follow up visit  No.  Do you have questions or concerns about your Care? No.  Actions: * If pain score is 4 or above: No action needed, pain <4.  1. Have you developed a fever since your procedure? no  2.   Have you had an respiratory symptoms (SOB or cough) since your procedure? no  3.   Have you tested positive for COVID 19 since your procedure no  4.   Have you had any family members/close contacts diagnosed with the COVID 19 since your procedure?  no   If yes to any of these questions please route to Joylene John, RN and Joella Prince, RN

## 2020-06-16 ENCOUNTER — Encounter: Payer: Self-pay | Admitting: Internal Medicine

## 2020-06-17 ENCOUNTER — Other Ambulatory Visit: Payer: Self-pay

## 2020-06-17 ENCOUNTER — Encounter: Payer: Self-pay | Admitting: Physician Assistant

## 2020-06-17 ENCOUNTER — Ambulatory Visit (INDEPENDENT_AMBULATORY_CARE_PROVIDER_SITE_OTHER): Payer: 59 | Admitting: Physician Assistant

## 2020-06-17 VITALS — BP 124/76 | HR 99 | Temp 97.4°F | Ht 64.0 in | Wt 242.7 lb

## 2020-06-17 DIAGNOSIS — E782 Mixed hyperlipidemia: Secondary | ICD-10-CM

## 2020-06-17 DIAGNOSIS — E118 Type 2 diabetes mellitus with unspecified complications: Secondary | ICD-10-CM | POA: Diagnosis not present

## 2020-06-17 DIAGNOSIS — I152 Hypertension secondary to endocrine disorders: Secondary | ICD-10-CM

## 2020-06-17 DIAGNOSIS — E1169 Type 2 diabetes mellitus with other specified complication: Secondary | ICD-10-CM

## 2020-06-17 DIAGNOSIS — F4321 Adjustment disorder with depressed mood: Secondary | ICD-10-CM | POA: Diagnosis not present

## 2020-06-17 DIAGNOSIS — E1159 Type 2 diabetes mellitus with other circulatory complications: Secondary | ICD-10-CM

## 2020-06-17 LAB — POCT GLYCOSYLATED HEMOGLOBIN (HGB A1C): Hemoglobin A1C: 6.8 % — AB (ref 4.0–5.6)

## 2020-06-17 MED ORDER — TRESIBA FLEXTOUCH 100 UNIT/ML ~~LOC~~ SOPN: PEN_INJECTOR | SUBCUTANEOUS | 0 refills | Status: DC

## 2020-06-17 NOTE — Assessment & Plan Note (Addendum)
-  A1c today 6.8, stable. -Patient reports hypoglycemic events and discussed treatment adjustments by reducing Tresiba to 30 units at bedtime and continue Ozempic 0.5 mg once weekly. Patient verbalized understanding. -Follow low carbohydrate and glucose diet. -Will continue to monitor.

## 2020-06-17 NOTE — Assessment & Plan Note (Signed)
-  Associated with diabetes mellitus type 2, hypertension and hyperlipidemia. -Encourage to continue with weight loss efforts with portion control and diet changes.

## 2020-06-17 NOTE — Assessment & Plan Note (Addendum)
-  Controlled.  -Continue current medication regimen. -Recommend to stay well hydrated and continue low sodium diet. -Will continue to monitor.

## 2020-06-17 NOTE — Patient Instructions (Signed)

## 2020-06-17 NOTE — Assessment & Plan Note (Addendum)
-  Last lipid panel: total cholesterol 145, triglycerides 70, HDL 50, LDL 81 (goal <70, improved from prior). -Continue current medication regimen. Last hepatic function normal. -Recommend to follow a heart healthy diet and reduce whole dairy products. Increase physical activity. -Will continue to monitor alongside cardiology.

## 2020-06-17 NOTE — Progress Notes (Signed)
Established Patient Office Visit  Subjective:  Patient ID: Julie Prince, female    DOB: 02-25-1961  Age: 60 y.o. MRN: [id number]  CC:  Chief Complaint  Patient presents with  . Diabetes  . Hypertension  . Hyperlipidemia    HPI [facility] presents for follow up on diabetes mellitus, hypertension and hyperlipidemia.  Diabetes: Pt denies increased urination or thirst. Pt reports medication compliance. Reports a couple of low blood sugars in the 50s which usually improves once she has something to eat right away. Uses Freestyle Libre. Continues to monitor carbohydrates and glucose but has not been as diligent recently. Unfortunately, her brother passed away and just returned from [place].   HTN: Pt denies chest pain, palpitations, dizziness or headache. Left lower leg edema is stable. Taking medication as directed without side effects. Checks BP at home and readings usually 130s/60-65. Pt continues to follows a low salt diet and tries to stay hydrated.  HLD: Pt taking medication as directed without issues. Denies side effects including myalgias and RUQ pain. Reports trying to get back on track with watching her diet. Has reduced fried foods and red meat. Is planning to be more active to also help with weight loss.   Past Medical History:  Diagnosis Date  . Allergy   . Barrett esophagus   . Cataract    left cataract removal  . Diverticulosis   . Essential hypertension, benign 09/12/2013  . GERD (gastroesophageal reflux disease)   . Hiatal hernia   . Hyperlipidemia 09/12/2013  . 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 Peripheral vascular disease ([place])   . PONV (postoperative nausea and vomiting)   . Type 2 diabetes mellitus ([place]) 09/12/2013   Dr. Posey Pronto at [facility]   . Wears partial dentures    upper    Past Surgical History:  Procedure Laterality Date  . ABDOMINAL AORTOGRAM W/LOWER EXTREMITY N/A 06/06/2018   Procedure: ABDOMINAL AORTOGRAM W/LOWER  EXTREMITY;  Surgeon: Wellington Hampshire, MD;  Location: [facility];  Service: Cardiovascular;  Laterality: N/A;  . ABDOMINAL AORTOGRAM W/LOWER EXTREMITY N/A 09/18/2019   Procedure: ABDOMINAL AORTOGRAM W/LOWER EXTREMITY;  Surgeon: Wellington Hampshire, MD;  Location: [facility];  Service: Cardiovascular;  Laterality: N/A;  . AMPUTATION Left 01/23/2020   Procedure: LEFT FIFTH TOE AMPUTATION;  Surgeon: Waynetta Sandy, MD;  Location: [facility];  Service: Vascular;  Laterality: Left;  . APPENDECTOMY    . CATARACT EXTRACTION Left   . CESAREAN SECTION  1990  . COLONOSCOPY    . FEMORAL-POPLITEAL BYPASS GRAFT Left 11/12/2019   Procedure: BYPASS GRAFT FEMORAL-ABOVE KNEE POPLITEAL ARTERY;  Surgeon: Waynetta Sandy, MD;  Location: [facility];  Service: Vascular;  Laterality: Left;  . right tube and ovary removed    . WISDOM TOOTH EXTRACTION      Family History  Problem Relation Age of Onset  . Lung cancer Mother   . Stroke Father   . Heart disease Maternal Aunt   . Fibromyalgia Sister   . Alzheimer's disease Maternal Grandmother   . Colon cancer Neg Hx   . Esophageal cancer Neg Hx   . Rectal cancer Neg Hx   . Stomach cancer Neg Hx   . Pancreatic cancer Neg Hx     Social History   Socioeconomic History  . Marital status: Single    Spouse name: Not on file  . Number of children: Not on file  . Years of education: Not on file  .  Highest education level: Not on file  Occupational History  . Not on file  Tobacco Use  . Smoking status: Former Smoker    Packs/day: 0.50    Quit date: 11/07/2000    Years since quitting: 19.6  . Smokeless tobacco: Never Used  . Tobacco comment: quit 30 plus years ago.  Vaping Use  . Vaping Use: Never used  Substance and Sexual Activity  . Alcohol use: No    Alcohol/week: 0.0 standard drinks  . Drug use: No  . Sexual activity: Yes    Birth control/protection: Post-menopausal  Other Topics Concern  . Not on file  Social History  Narrative  . Not on file   Social Determinants of Health   Financial Resource Strain: Not on file  Food Insecurity: Not on file  Transportation Needs: Not on file  Physical Activity: Not on file  Stress: Not on file  Social Connections: Not on file  Intimate Partner Violence: Not on file    Outpatient Medications Prior to Visit  Medication Sig Dispense Refill  . aspirin EC 81 MG tablet Take 81 mg by mouth in the morning. Swallow whole.    [place] Kitchen atorvastatin (LIPITOR) 40 MG tablet TAKE 1 TABLET BY MOUTH AT BEDTIME 90 tablet 2  . cilostazol (PLETAL) 50 MG tablet Take 1 tablet (50 mg total) by mouth 2 (two) times daily. 180 tablet 3  . Continuous Blood Gluc Sensor (FREESTYLE LIBRE 14 DAY SENSOR) MISC USE AS DIRECTED EVERY  14  DAYS 2 each 0  . metoprolol tartrate (LOPRESSOR) 50 MG tablet Take 1 tablet (50 mg total) by mouth 2 (two) times daily. 180 tablet 3  . pantoprazole (PROTONIX) 40 MG tablet Take 1 tablet (40 mg total) by mouth daily. 90 tablet 3  . Semaglutide,0.25 or 0.5MG /DOS, (OZEMPIC, 0.25 OR 0.5 MG/DOSE,) 2 MG/1.5ML SOPN INJECT 0.25MG  SUBCUTANOUSLY ONCE WEEKLY FOR 4 WEEKS THEN INJECT 0.5MG  ONCE WEEKLY 2 mL 0  . TRESIBA FLEXTOUCH 100 UNIT/ML FlexTouch Pen INJECT 50 UNITS SUBCUTANEOUSLY AT BEDTIME 15 mL 0   No facility-administered medications prior to visit.    Allergies  Allergen Reactions  . Penicillins Hives and Shortness Of Breath  . Metformin And Related Diarrhea    ROS Review of Systems A fourteen system review of systems was performed and found to be positive as per HPI.    Objective:    Physical Exam General:  Well Developed, well nourished, in no acute distress  Neuro:  Alert and oriented,  extra-ocular muscles intact  HEENT:  Normocephalic, atraumatic, neck supple Skin:  no gross rash, warm, pink. Cardiac:  RRR, S1 S2 wnl's Respiratory:  ECTA B/L w/o wheezing, Not using accessory muscles, speaking in full sentences- unlabored. Extremities: no gangrene or  open wounds   Psych:  No HI/SI, judgement and insight good, Euthymic mood. Full Affect.   BP 124/76   Pulse 99   Temp (!) 97.4 F (36.3 C)   Ht 5\' 4"  (1.626 m)   Wt 242 lb 11.2 oz (110.1 kg)   LMP  (LMP Unknown)   SpO2 98%   BMI 41.66 kg/m  Wt Readings from Last 3 Encounters:  06/17/20 242 lb 11.2 oz (110.1 kg)  06/09/20 230 lb (104.3 kg)  05/15/20 234 lb 1.6 oz (106.2 kg)     Health Maintenance Due  Topic Date Due  . COVID-19 Vaccine (1) Never done  . PAP SMEAR-Modifier  04/14/2018  . OPHTHALMOLOGY EXAM  07/08/2018  . FOOT EXAM  07/13/2018  .  MAMMOGRAM  06/09/2019  . INFLUENZA VACCINE  10/20/2019    There are no preventive care reminders to display for this patient.  Lab Results  Component Value Date   TSH 1.790 12/26/2018   Lab Results  Component Value Date   WBC 6.7 03/12/2020   HGB 13.7 03/12/2020   HCT 40.1 03/12/2020   MCV 91 03/12/2020   PLT 270 03/12/2020   Lab Results  Component Value Date   NA 146 (H) 03/12/2020   K 5.0 03/12/2020   CO2 26 03/12/2020   GLUCOSE 81 03/12/2020   BUN 11 03/12/2020   CREATININE 0.94 03/12/2020   BILITOT 0.3 03/12/2020   ALKPHOS 111 03/12/2020   AST 13 03/12/2020   ALT 14 03/12/2020   PROT 7.2 03/12/2020   ALBUMIN 4.2 03/12/2020   CALCIUM 9.4 03/12/2020   ANIONGAP 10 01/23/2020   Lab Results  Component Value Date   CHOL 145 03/12/2020   Lab Results  Component Value Date   HDL 50 03/12/2020   Lab Results  Component Value Date   LDLCALC 81 03/12/2020   Lab Results  Component Value Date   TRIG 70 03/12/2020   Lab Results  Component Value Date   CHOLHDL 2.9 03/12/2020   Lab Results  Component Value Date   HGBA1C 6.8 (A) 06/17/2020      Assessment & Plan:   Problem List Items Addressed This Visit      Cardiovascular and Mediastinum   Hypertension associated with diabetes ([place])    -Controlled.  -Continue current medication regimen. -Recommend to stay well hydrated and continue low sodium  diet. -Will continue to monitor.      Relevant Medications   insulin degludec (TRESIBA FLEXTOUCH) 100 UNIT/ML FlexTouch Pen     Endocrine   Currently controlled diabetes mellitus type 2 with complications ([name]) - Primary (Chronic)    -A1c today 6.8, stable. -Patient reports hypoglycemic events and discussed treatment adjustments by reducing Tresiba to 30 units at bedtime and continue Ozempic 0.5 mg once weekly. Patient verbalized understanding. -Follow low carbohydrate and glucose diet. -Will continue to monitor.      Relevant Medications   insulin degludec (TRESIBA FLEXTOUCH) 100 UNIT/ML FlexTouch Pen   Other Relevant Orders   POCT glycosylated hemoglobin (Hb A1C) (Completed)   Mixed diabetic hyperlipidemia associated with type 2 diabetes mellitus ([name]) (Chronic)    -Last lipid panel: total cholesterol 145, triglycerides 70, HDL 50, LDL 81 (goal <70, improved from prior). -Continue current medication regimen. Last hepatic function normal. -Recommend to follow a heart healthy diet and reduce whole dairy products. Increase physical activity. -Will continue to monitor alongside cardiology.      Relevant Medications   insulin degludec (TRESIBA FLEXTOUCH) 100 UNIT/ML FlexTouch Pen     Other   Obesity, Class III, morbid obesity  ([name]) (Chronic)    -Associated with diabetes mellitus type 2, hypertension and hyperlipidemia. -Encourage to continue with weight loss efforts with portion control and diet changes.      Relevant Medications   insulin degludec (TRESIBA FLEXTOUCH) 100 UNIT/ML FlexTouch Pen    Other Visit Diagnoses    Grief         Grief: -Patient has a good support system and recommend to consider grief counseling if experiencing difficulty coping with loss.   Meds ordered this encounter  Medications  . insulin degludec (TRESIBA FLEXTOUCH) 100 UNIT/ML FlexTouch Pen    Sig: 30 UNITS before bedtime    Dispense:  15 mL    Refill:  0    Follow-up: Return in about 3  months (around 09/17/2020) for DM, HTN, HLD.    [name], PA-C

## 2020-06-22 ENCOUNTER — Telehealth: Payer: Self-pay | Admitting: Physician Assistant

## 2020-06-22 DIAGNOSIS — E1165 Type 2 diabetes mellitus with hyperglycemia: Secondary | ICD-10-CM

## 2020-06-22 MED ORDER — FREESTYLE LIBRE 14 DAY SENSOR MISC: 0 refills | Status: DC

## 2020-06-22 MED ORDER — OZEMPIC (0.25 OR 0.5 MG/DOSE) 2 MG/1.5ML ~~LOC~~ SOPN: PEN_INJECTOR | SUBCUTANEOUS | 0 refills | Status: DC

## 2020-06-22 NOTE — Telephone Encounter (Signed)
rx sent to pharmacy. AS, CMA 

## 2020-06-25 ENCOUNTER — Telehealth: Payer: Self-pay | Admitting: Physician Assistant

## 2020-06-25 NOTE — Telephone Encounter (Signed)
Patient would like all future prescriptions sent to [facility] on [address], please. Please advise, thanks.

## 2020-07-04 ENCOUNTER — Other Ambulatory Visit: Payer: Self-pay | Admitting: Physician Assistant

## 2020-07-04 DIAGNOSIS — E1165 Type 2 diabetes mellitus with hyperglycemia: Secondary | ICD-10-CM

## 2020-07-08 ENCOUNTER — Other Ambulatory Visit: Payer: Self-pay | Admitting: Physician Assistant

## 2020-07-08 DIAGNOSIS — E1165 Type 2 diabetes mellitus with hyperglycemia: Secondary | ICD-10-CM

## 2020-07-09 ENCOUNTER — Telehealth: Payer: Self-pay | Admitting: Physician Assistant

## 2020-07-09 DIAGNOSIS — E1165 Type 2 diabetes mellitus with hyperglycemia: Secondary | ICD-10-CM

## 2020-07-09 MED ORDER — FREESTYLE LIBRE 14 DAY SENSOR MISC: 0 refills | Status: DC

## 2020-07-09 MED ORDER — OZEMPIC (0.25 OR 0.5 MG/DOSE) 2 MG/1.5ML ~~LOC~~ SOPN: PEN_INJECTOR | SUBCUTANEOUS | 1 refills | Status: DC

## 2020-07-09 MED ORDER — FREESTYLE LIBRE 2 READER DEVI: 0 refills | Status: DC

## 2020-07-09 MED ORDER — FREESTYLE LIBRE 2 SENSOR MISC: 0 refills | Status: DC

## 2020-07-09 NOTE — Addendum Note (Signed)
Addended by: Mickel Crow on: 07/09/2020 09:50 AM   Modules accepted: Orders

## 2020-07-09 NOTE — Addendum Note (Signed)
Addended by: Mickel Crow on: 07/09/2020 01:37 PM   Modules accepted: Orders

## 2020-07-09 NOTE — Telephone Encounter (Signed)
This has been sent to pharmacy. AS, CMA

## 2020-07-09 NOTE — Telephone Encounter (Signed)
Patient called in asking for her freestyle libre and they discontined the 14 and it is now a number 2. Patient has been out for two days and she needs the sensor and the reader since it is new. Patient used Paediatric nurse on [address] in [place]. Please advise, thanks.

## 2020-07-09 NOTE — Telephone Encounter (Signed)
Refills sent to requested pharmacy. AS, CMA 

## 2020-07-09 NOTE — Telephone Encounter (Signed)
Patient states she did not get her sensors for her diabetic Freestyle Libre 2 and patient needs her ozempic refilled as well. Patient uses Paediatric nurse on [address]. Please advise, thanks.

## 2020-07-30 ENCOUNTER — Ambulatory Visit: Payer: 59 | Admitting: Gastroenterology

## 2020-08-04 ENCOUNTER — Telehealth: Payer: Self-pay | Admitting: Physician Assistant

## 2020-08-04 DIAGNOSIS — E1165 Type 2 diabetes mellitus with hyperglycemia: Secondary | ICD-10-CM

## 2020-08-04 MED ORDER — EMPAGLIFLOZIN 10 MG PO TABS: 10.0000 mg | ORAL_TABLET | Freq: Every day | ORAL | 0 refills | Status: DC

## 2020-08-04 NOTE — Telephone Encounter (Signed)
Patient states her BS has been around 150s in the morning and 270 at night. She states she has had no new changes in diet or meds. Pt denies any stressors.

## 2020-08-04 NOTE — Telephone Encounter (Signed)
Patient is stating that her insulin may not be sufficient- her glucose levels are high, and she believes she needs to start a different Rx- she is checking her levels daily and it is higher than normal. Please advise. Thank you

## 2020-08-04 NOTE — Addendum Note (Signed)
Addended by: Mickel Crow on: 08/04/2020 04:52 PM   Modules accepted: Orders

## 2020-08-04 NOTE — Telephone Encounter (Signed)
Per Herb Grays adding Jardiance 10mg  1 PO QD to patient medication regimen. Sending 30 day supply. Pt is aware and was advised to continue monitoring blood sugars. AS, CMA

## 2020-08-19 ENCOUNTER — Other Ambulatory Visit: Payer: Self-pay | Admitting: Physician Assistant

## 2020-08-19 DIAGNOSIS — E1165 Type 2 diabetes mellitus with hyperglycemia: Secondary | ICD-10-CM

## 2020-08-28 ENCOUNTER — Other Ambulatory Visit: Payer: Self-pay | Admitting: Physician Assistant

## 2020-08-28 DIAGNOSIS — E118 Type 2 diabetes mellitus with unspecified complications: Secondary | ICD-10-CM

## 2020-09-02 ENCOUNTER — Other Ambulatory Visit: Payer: Self-pay | Admitting: Physician Assistant

## 2020-09-02 DIAGNOSIS — E118 Type 2 diabetes mellitus with unspecified complications: Secondary | ICD-10-CM

## 2020-09-04 ENCOUNTER — Other Ambulatory Visit: Payer: Self-pay | Admitting: Physician Assistant

## 2020-09-04 DIAGNOSIS — E1165 Type 2 diabetes mellitus with hyperglycemia: Secondary | ICD-10-CM

## 2020-09-07 ENCOUNTER — Other Ambulatory Visit: Payer: Self-pay | Admitting: Physician Assistant

## 2020-09-07 DIAGNOSIS — E1165 Type 2 diabetes mellitus with hyperglycemia: Secondary | ICD-10-CM

## 2020-09-17 ENCOUNTER — Other Ambulatory Visit: Payer: Self-pay

## 2020-09-17 ENCOUNTER — Ambulatory Visit (INDEPENDENT_AMBULATORY_CARE_PROVIDER_SITE_OTHER): Payer: 59 | Admitting: Physician Assistant

## 2020-09-17 ENCOUNTER — Encounter: Payer: Self-pay | Admitting: Physician Assistant

## 2020-09-17 VITALS — BP 132/77 | HR 92 | Temp 98.7°F | Ht 64.0 in | Wt 241.2 lb

## 2020-09-17 DIAGNOSIS — E782 Mixed hyperlipidemia: Secondary | ICD-10-CM

## 2020-09-17 DIAGNOSIS — E118 Type 2 diabetes mellitus with unspecified complications: Secondary | ICD-10-CM

## 2020-09-17 DIAGNOSIS — E1159 Type 2 diabetes mellitus with other circulatory complications: Secondary | ICD-10-CM | POA: Diagnosis not present

## 2020-09-17 DIAGNOSIS — E1169 Type 2 diabetes mellitus with other specified complication: Secondary | ICD-10-CM

## 2020-09-17 DIAGNOSIS — I152 Hypertension secondary to endocrine disorders: Secondary | ICD-10-CM | POA: Diagnosis not present

## 2020-09-17 LAB — POCT GLYCOSYLATED HEMOGLOBIN (HGB A1C): Hemoglobin A1C: 6.8 % — AB (ref 4.0–5.6)

## 2020-09-17 NOTE — Progress Notes (Signed)
Established Patient Office Visit  Subjective:  Patient ID: Julie Prince, female    DOB: 02-May-1960  Age: 60 y.o. MRN: [id number]  CC:  Chief Complaint  Patient presents with   Follow-up   Diabetes   Hypertension    HPI Julie Prince presents for follow up on diabetes mellitus, hypertension and hyperlipidemia.  Diabetes: Pt denies increased urination or thirst. Pt reports medication compliance. No hypoglycemic events. Checking glucose at home. Reports FBS range from  80-90. Is currently through increased personal stress and states since starting Jardiance her blood sugars have improved. Medication is expensive but for now is able to afford medication. Continues to monitor carbohydrate and glucose intake.  HTN: Pt denies chest pain, palpitations, dizziness or orthopnea. Edema is stable. Taking medication as directed without side effects. Checks BP at home and readings are usually <135/85. Pt follows a low salt diet.   HLD: Pt taking medication as directed without issues. Continues to limit saturated fats such as fried foods and red meat.   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 Posey Pronto at [place]    Wears partial dentures    upper    Past Surgical History:  Procedure Laterality Date   ABDOMINAL AORTOGRAM W/LOWER EXTREMITY N/A 06/06/2018   Procedure: ABDOMINAL AORTOGRAM W/LOWER EXTREMITY;  Surgeon: Wellington Hampshire, MD;  Location: [facility];  Service: Cardiovascular;  Laterality: N/A;   ABDOMINAL AORTOGRAM W/LOWER EXTREMITY N/A 09/18/2019   Procedure: ABDOMINAL  AORTOGRAM W/LOWER EXTREMITY;  Surgeon: Wellington Hampshire, MD;  Location: [facility];  Service: Cardiovascular;  Laterality: N/A;   AMPUTATION Left 01/23/2020   Procedure: LEFT FIFTH TOE AMPUTATION;  Surgeon: Waynetta Sandy, MD;  Location: [facility];  Service: Vascular;  Laterality: Left;   APPENDECTOMY     CATARACT EXTRACTION Left    CESAREAN SECTION  1990   COLONOSCOPY     FEMORAL-POPLITEAL BYPASS GRAFT Left 11/12/2019   Procedure: BYPASS GRAFT FEMORAL-ABOVE KNEE POPLITEAL ARTERY;  Surgeon: Waynetta Sandy, MD;  Location: [facility];  Service: Vascular;  Laterality: Left;   right tube and ovary removed     WISDOM TOOTH EXTRACTION      Family History  Problem Relation Age of Onset   Lung cancer Mother    Stroke Father    Heart disease Maternal Aunt    Fibromyalgia Sister    Alzheimer's disease Maternal Grandmother    Colon cancer Neg Hx    Esophageal cancer Neg Hx    Rectal cancer Neg Hx    Stomach cancer Neg Hx    Pancreatic cancer Neg Hx     Social History   Socioeconomic History   Marital status: Single    Spouse name: Not on file   Number of children: Not on file   Years of education: Not on file   Highest education level: Not on file  Occupational History   Not on file  Tobacco Use   Smoking status: Former    Packs/day: 0.50    Pack years: 0.00  Types: Cigarettes    Quit date: 11/07/2000    Years since quitting: 19.8   Smokeless tobacco: Never   Tobacco comments:    quit 30 plus years ago.  Vaping Use   Vaping Use: Never used  Substance and Sexual Activity   Alcohol use: No    Alcohol/week: 0.0 standard drinks   Drug use: No   Sexual activity: Yes    Birth control/protection: Post-menopausal  Other Topics Concern   Not on file  Social History Narrative   Not on file   Social Determinants of Health   Financial Resource Strain: Not on file  Food Insecurity: Not on file  Transportation Needs: Not on file  Physical Activity: Not on  file  Stress: Not on file  Social Connections: Not on file  Intimate Partner Violence: Not on file    Outpatient Medications Prior to Visit  Medication Sig Dispense Refill   aspirin EC 81 MG tablet Take 81 mg by mouth in the morning. Swallow whole.     atorvastatin (LIPITOR) 40 MG tablet TAKE 1 TABLET BY MOUTH AT BEDTIME 90 tablet 2   cilostazol (PLETAL) 50 MG tablet Take 1 tablet (50 mg total) by mouth 2 (two) times daily. 180 tablet 3   Continuous Blood Gluc Receiver (FREESTYLE LIBRE 2 READER) DEVI Use daily to check fasting blood sugar 2 each 0   Continuous Blood Gluc Receiver (FREESTYLE LIBRE 2 READER) DEVI Use to check fasting bs and 2 hrs after largest meal 3 each 0   Continuous Blood Gluc Sensor (FREESTYLE LIBRE 2 SENSOR) MISC USE TO CHECK FASTING BLOOD SUGAR AND 2 HOURS AFTER LARGEST MEAL 3 each 0   insulin degludec (TRESIBA FLEXTOUCH) 100 UNIT/ML FlexTouch Pen INJECT 50 UNITS SUBCUTANEOUSLY AT BEDTIME 15 mL 0   JARDIANCE 10 MG TABS tablet TAKE 1 TABLET BY MOUTH ONCE DAILY BEFORE BREAKFAST 30 tablet 0   metoprolol tartrate (LOPRESSOR) 50 MG tablet Take 1 tablet (50 mg total) by mouth 2 (two) times daily. 180 tablet 3   pantoprazole (PROTONIX) 40 MG tablet Take 1 tablet (40 mg total) by mouth daily. 90 tablet 3   Semaglutide,0.25 or 0.5MG /DOS, (OZEMPIC, 0.25 OR 0.5 MG/DOSE,) 2 MG/1.5ML SOPN INJECT 0.5 MG SUBCUTANEOUSLY  ONCE A WEEK 2 mL 0   No facility-administered medications prior to visit.    Allergies  Allergen Reactions   Penicillins Hives and Shortness Of Breath   Metformin And Related Diarrhea    ROS Review of Systems A fourteen system review of systems was performed and found to be positive as per HPI.    Objective:    Physical Exam General:  Well Developed, well nourished, in no acute distress  Neuro:  Alert and oriented,  extra-ocular muscles intact  HEENT:  Normocephalic, atraumatic, neck supple Skin:  no gross rash, warm, pink. Cardiac:  RRR Respiratory:   ECTA B/L w/o wheezing, crackles or rales. Not using accessory muscles, speaking in full sentences- unlabored. Vascular:  Ext warm, no cyanosis apprec.; 1+pitting edema  Psych:  No HI/SI, judgement and insight good, Euthymic mood. Full Affect.  BP 132/77   Pulse 92   Temp 98.7 F (37.1 C)   Ht 5\' 4"  (1.626 m)   Wt 241 lb 3.2 oz (109.4 kg)   LMP  (LMP Unknown)   SpO2 99%   BMI 41.40 kg/m  Wt Readings from Last 3 Encounters:  09/17/20 241 lb 3.2 oz (109.4 kg)  06/17/20 242 lb 11.2 oz (110.1 kg)  06/09/20 230  lb (104.3 kg)     Health Maintenance Due  Topic Date Due   COVID-19 Vaccine (1) Never done   Zoster Vaccines- Shingrix (1 of 2) Never done   PAP SMEAR-Modifier  04/14/2018   OPHTHALMOLOGY EXAM  07/08/2018   MAMMOGRAM  06/09/2019    There are no preventive care reminders to display for this patient.  Lab Results  Component Value Date   TSH 1.790 12/26/2018   Lab Results  Component Value Date   WBC 6.7 03/12/2020   HGB 13.7 03/12/2020   HCT 40.1 03/12/2020   MCV 91 03/12/2020   PLT 270 03/12/2020   Lab Results  Component Value Date   NA 146 (H) 03/12/2020   K 5.0 03/12/2020   CO2 26 03/12/2020   GLUCOSE 81 03/12/2020   BUN 11 03/12/2020   CREATININE 0.94 03/12/2020   BILITOT 0.3 03/12/2020   ALKPHOS 111 03/12/2020   AST 13 03/12/2020   ALT 14 03/12/2020   PROT 7.2 03/12/2020   ALBUMIN 4.2 03/12/2020   CALCIUM 9.4 03/12/2020   ANIONGAP 10 01/23/2020   Lab Results  Component Value Date   CHOL 145 03/12/2020   Lab Results  Component Value Date   HDL 50 03/12/2020   Lab Results  Component Value Date   LDLCALC 81 03/12/2020   Lab Results  Component Value Date   TRIG 70 03/12/2020   Lab Results  Component Value Date   CHOLHDL 2.9 03/12/2020   Lab Results  Component Value Date   HGBA1C 6.8 (A) 09/17/2020      Assessment & Plan:   Problem List Items Addressed This Visit       Cardiovascular and Mediastinum   Hypertension associated  with diabetes ([place])     Endocrine   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 Will continue current medication regimen. Continue ambulatory glucose monitoring and low carbohydrate/glucose diet.  -Encourage to incorporate stress reduction techniques such as exercise (walking). -Will continue to monitor.  Hypertension associated with diabetes: -Stable. -Continue current medication regimen. -Will continue to monitor.  Mixed diabetic hyperlipidemia associated with type 2 diabetes mellitus: -Last lipid panel: total cholesterol 145, triglycerides 70, HDL 50, LDL 81 (goal <70) -Followed by cardiology. On atorvastatin 40 mg. -Follow low fat diet. -Will continue to monitor and repeat lipid panel/hepatic function with CPE.   No orders of the defined types were placed in this encounter.   Follow-up: Return in about 3 months (around 12/18/2020) for CPE and FBW few days prior .   Note:  This note was prepared with assistance of Dragon voice recognition software. Occasional wrong-word or sound-a-like substitutions may have occurred due to the inherent limitations of voice recognition software.  [name], PA-C

## 2020-09-17 NOTE — Patient Instructions (Signed)
Managing Stress, Adult Feeling a certain amount of stress is normal. Stress helps our body and mind get ready to deal with the demands of life. Stress hormones can motivate you to do well at work and meet your responsibilities. However severe or long-lasting (chronic) stress can affect your mental and physical health. Chronic stress puts you at higher risk for anxiety, depression, and other health problems like digestiveproblems, muscle aches, heart disease, high blood pressure, and stroke. What are the causes? Common causes of stress include: Demands from work, such as deadlines, feeling overworked, or having long hours. Pressures at home, such as money issues, disagreements with a spouse, or parenting issues. Pressures from major life changes, such as divorce, moving, loss of a loved one, or chronic illness. You may be at higher risk for stress-related problems if you do not get enough sleep, are in poor health, do not have emotional support, or have a mentalhealth disorder like anxiety or depression. How to recognize stress Stress can make you: Have trouble sleeping. Feel sad, anxious, irritable, or overwhelmed. Lose your appetite. Overeat or want to eat unhealthy foods. Want to use drugs or alcohol. Stress can also cause physical symptoms, such as: Sore, tense muscles, especially in the shoulders and neck. Headaches. Trouble breathing. A faster heart rate. Stomach pain, nausea, or vomiting. Diarrhea or constipation. Trouble concentrating. Follow these instructions at home: Lifestyle Identify the source of your stress and your reaction to it. See a therapist who can help you change your reactions. When there are stressful events: Talk about it with family, friends, or co-workers. Try to think realistically about stressful events and not ignore them or overreact. Try to find the positives in a stressful situation and not focus on the negatives. Cut back on responsibilities at work and  home, if possible. Ask for help from friends or family members if you need it. Find ways to cope with stress, such as: Meditation. Deep breathing. Yoga or tai chi. Progressive muscle relaxation. Doing art, playing music, or reading. Making time for fun activities. Spending time with family and friends. Get support from family, friends, or spiritual resources. Eating and drinking Eat a healthy diet. This includes: Eating foods that are high in fiber, such as beans, whole grains, and fresh fruits and vegetables. Limiting foods that are high in fat and processed sugars, such as fried and sweet foods. Do not skip meals or overeat. Drink enough fluid to keep your urine pale yellow. Alcohol use Do not drink alcohol if: Your health care provider tells you not to drink. You are pregnant, may be pregnant, or are planning to become pregnant. Drinking alcohol is a way some people try to ease their stress. This can be dangerous, so if you drink alcohol: Limit how much you use to: 0-1 drink a day for women. 0-2 drinks a day for men. Be aware of how much alcohol is in your drink. In the [place] one drink equals one 12 oz bottle of beer (355 mL), one 5 oz glass of wine (148 mL), or one 1 oz glass of hard liquor (44 mL). Activity  Include 30 minutes of exercise in your daily schedule. Exercise is a good stress reducer. Include time in your day for an activity that you find relaxing. Try taking a walk, going on a bike ride, reading a book, or listening to music. Schedule your time in a way that lowers stress, and keep a consistent schedule. Prioritize what is most important to get done.  General  instructions Get enough sleep. Try to go to sleep and get up at about the same time every day. Take over-the-counter and prescription medicines only as told by your health care provider. Do not use any products that contain nicotine or tobacco, such as cigarettes, e-cigarettes, and chewing tobacco. If you  need help quitting, ask your health care provider. Do not use drugs or smoke to cope with stress. Keep all follow-up visits as told by your health care provider. This is important. Where to find support Talk with your health care provider about stress management or finding a support group. Find a therapist to work with you on your stress management techniques. Contact a health care provider if: Your stress symptoms get worse. You are unable to manage your stress at home. You are struggling to stop using drugs or alcohol. Get help right away if: You may be a danger to yourself or others. You have any thoughts of death or suicide. If you ever feel like you may hurt yourself or others, or have thoughts about taking your own life, get help right away. You can go to your nearest emergency department or call: Your local emergency services (911 in the [place] A suicide crisis helpline, such as the [facility] at 343-055-5805. This is open 24 hours a day. Summary Feeling a certain amount of stress is normal, but severe or long-lasting (chronic) stress can affect your mental and physical health. Chronic stress can put you at higher risk for anxiety, depression, and other health problems like digestive problems, muscle aches, heart disease, high blood pressure, and stroke. You may be at higher risk for stress-related problems if you do not get enough sleep, are in poor health, lack emotional support, or have a mental health disorder like anxiety or depression. Identify the source of your stress and your reaction to it. Try talking about stressful events with family, friends, or co-workers, finding a coping method, or getting support from spiritual resources. If you need more help, talk with your health care provider about finding a support group or a mental health therapist. This information is not intended to replace advice given to you by your health care provider. Make sure  you discuss any questions you have with your healthcare provider. Document Revised: 10/03/2018 Document Reviewed: 10/03/2018 Elsevier Patient Education  [employer].

## 2020-10-05 ENCOUNTER — Other Ambulatory Visit: Payer: Self-pay | Admitting: Physician Assistant

## 2020-10-05 DIAGNOSIS — E1165 Type 2 diabetes mellitus with hyperglycemia: Secondary | ICD-10-CM

## 2020-10-07 ENCOUNTER — Other Ambulatory Visit: Payer: Self-pay | Admitting: Physician Assistant

## 2020-10-07 DIAGNOSIS — E1165 Type 2 diabetes mellitus with hyperglycemia: Secondary | ICD-10-CM

## 2020-10-22 ENCOUNTER — Other Ambulatory Visit: Payer: Self-pay | Admitting: Physician Assistant

## 2020-10-22 DIAGNOSIS — E118 Type 2 diabetes mellitus with unspecified complications: Secondary | ICD-10-CM

## 2020-11-02 ENCOUNTER — Other Ambulatory Visit: Payer: Self-pay | Admitting: Physician Assistant

## 2020-11-02 DIAGNOSIS — E1165 Type 2 diabetes mellitus with hyperglycemia: Secondary | ICD-10-CM

## 2020-11-03 ENCOUNTER — Other Ambulatory Visit: Payer: Self-pay | Admitting: Physician Assistant

## 2020-11-03 DIAGNOSIS — E1165 Type 2 diabetes mellitus with hyperglycemia: Secondary | ICD-10-CM

## 2020-11-05 ENCOUNTER — Other Ambulatory Visit: Payer: Self-pay | Admitting: Physician Assistant

## 2020-11-05 DIAGNOSIS — E1165 Type 2 diabetes mellitus with hyperglycemia: Secondary | ICD-10-CM

## 2020-11-12 ENCOUNTER — Ambulatory Visit (INDEPENDENT_AMBULATORY_CARE_PROVIDER_SITE_OTHER): Payer: 59 | Admitting: Physician Assistant

## 2020-11-12 ENCOUNTER — Ambulatory Visit (HOSPITAL_COMMUNITY)
Admission: RE | Admit: 2020-11-12 | Discharge: 2020-11-12 | Disposition: A | Discharge status: Home / Self Care | Payer: 59 | Source: Ambulatory Visit | Attending: Vascular Surgery | Admitting: Vascular Surgery

## 2020-11-12 ENCOUNTER — Ambulatory Visit (INDEPENDENT_AMBULATORY_CARE_PROVIDER_SITE_OTHER)
Admission: RE | Admit: 2020-11-12 | Discharge: 2020-11-12 | Disposition: A | Discharge status: Home / Self Care | Payer: 59 | Source: Ambulatory Visit | Attending: Vascular Surgery | Admitting: Vascular Surgery

## 2020-11-12 ENCOUNTER — Other Ambulatory Visit: Payer: Self-pay

## 2020-11-12 VITALS — BP 175/89 | HR 87 | Temp 98.6°F | Resp 20 | Ht 64.0 in | Wt 240.1 lb

## 2020-11-12 DIAGNOSIS — I739 Peripheral vascular disease, unspecified: Secondary | ICD-10-CM | POA: Diagnosis present

## 2020-11-12 NOTE — Progress Notes (Signed)
HISTORY AND PHYSICAL     CC:  follow up. Requesting Provider:  [name], PA-C  HPI: This is a 60 y.o. female who is here today for follow up for PAD.  She has extensive surgical history most recently having undergone left 5th toe amputation on 01/23/2020. Prior to that she had a left femoral to above knee popliteal vein bypass on 11/12/19 by Dr. Donzetta Matters. This was performed due to ulceration of her 5th toe. She was doing well at the time of her post op visit in November. She was using Darco shoe for weight bearing. She had no lower extremity claudication or rest pain and no new wounds. She was compliant with her aspirin, statin and Pletal.   Pt was last seen 05/15/2020 and at that time, she was doing well and walking at least 30 min a day.  She did have continued swelling of the left leg more than the right.  She was elevating her legs, which was helping.  She tried compression but felt they made her legs swell more.  She was not having any claudication or rest pain.  The pt returns today for follow up.  She states she is doing well.  She does not have any claudication or rest pain.  She does have some cramping in her legs at night.  Her amputation site is completely healed.   She is moving to [place] this weekend to lower her stress.  She states the apartment she will be living in has a swimming pool and  a gym and she plans to be active.    The pt is on a statin for cholesterol management.    The pt is on an aspirin.    Other AC:  Pletal The pt is on BB for hypertension.  The pt does have diabetes. Tobacco hx:  former  Pt does not have family hx of AAA.  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 Posey Pronto at [facility]    Wears partial dentures    upper    Past Surgical History:  Procedure Laterality Date   ABDOMINAL AORTOGRAM W/LOWER EXTREMITY N/A 06/06/2018   Procedure: ABDOMINAL AORTOGRAM W/LOWER EXTREMITY;  Surgeon: Wellington Hampshire, MD;  Location: [facility];  Service: Cardiovascular;  Laterality: N/A;   ABDOMINAL AORTOGRAM W/LOWER EXTREMITY N/A 09/18/2019   Procedure: ABDOMINAL AORTOGRAM W/LOWER EXTREMITY;  Surgeon: Wellington Hampshire, MD;  Location: [facility];  Service: Cardiovascular;  Laterality: N/A;   AMPUTATION Left 01/23/2020   Procedure: LEFT FIFTH TOE AMPUTATION;  Surgeon: Waynetta Sandy, MD;  Location: [facility];  Service: Vascular;  Laterality: Left;   APPENDECTOMY     CATARACT EXTRACTION Left    CESAREAN SECTION  1990   COLONOSCOPY     FEMORAL-POPLITEAL BYPASS GRAFT Left 11/12/2019   Procedure: BYPASS GRAFT FEMORAL-ABOVE KNEE POPLITEAL ARTERY;  Surgeon: Waynetta Sandy, MD;  Location: [facility];  Service: Vascular;  Laterality: Left;   right tube and ovary removed     WISDOM TOOTH EXTRACTION  Allergies  Allergen Reactions   Penicillins Hives and Shortness Of Breath   Metformin And Related Diarrhea    Current Outpatient Medications  Medication Sig Dispense Refill   aspirin EC 81 MG tablet Take 81 mg by mouth in the morning. Swallow whole.     atorvastatin (LIPITOR) 40 MG tablet TAKE 1 TABLET BY MOUTH AT BEDTIME 90 tablet 2   cilostazol (PLETAL) 50 MG tablet Take 1 tablet (50 mg total) by mouth 2 (two) times daily. 180 tablet 3   Continuous Blood Gluc Receiver (FREESTYLE LIBRE 2 READER) DEVI Use daily to check fasting blood sugar 2 each 0   Continuous Blood Gluc Receiver (FREESTYLE LIBRE 2 READER) DEVI Use to check fasting bs and 2 hrs after largest meal 3 each 0   Continuous Blood Gluc Sensor (FREESTYLE LIBRE 2  SENSOR) MISC USE TO CHECK BLOOD SUGAR AND 2 HOURS AFTER LARGEST MEAL 3 each 0   JARDIANCE 10 MG TABS tablet TAKE 1 TABLET BY MOUTH ONCE DAILY BEFORE BREAKFAST 30 tablet 0   metoprolol tartrate (LOPRESSOR) 50 MG tablet Take 1 tablet (50 mg total) by mouth 2 (two) times daily. 180 tablet 3   pantoprazole (PROTONIX) 40 MG tablet Take 1 tablet (40 mg total) by mouth daily. 90 tablet 3   Semaglutide,0.25 or 0.'5MG'$ /DOS, (OZEMPIC, 0.25 OR 0.5 MG/DOSE,) 2 MG/1.5ML SOPN INJECT 1/2 (ONE-HALF) MG SUBCUTANEOUSLY  ONCE A WEEK 2 mL 0   TRESIBA FLEXTOUCH 100 UNIT/ML FlexTouch Pen INJECT 50 UNITS SUBCUTANEOUSLY AT BEDTIME 15 mL 0   No current facility-administered medications for this visit.    Family History  Problem Relation Age of Onset   Lung cancer Mother    Stroke Father    Heart disease Maternal Aunt    Fibromyalgia Sister    Alzheimer's disease Maternal Grandmother    Colon cancer Neg Hx    Esophageal cancer Neg Hx    Rectal cancer Neg Hx    Stomach cancer Neg Hx    Pancreatic cancer Neg Hx     Social History   Socioeconomic History   Marital status: Single    Spouse name: Not on file   Number of children: Not on file   Years of education: Not on file   Highest education level: Not on file  Occupational History   Not on file  Tobacco Use   Smoking status: Former    Packs/day: 0.50    Types: Cigarettes    Quit date: 11/07/2000    Years since quitting: 20.0   Smokeless tobacco: Never   Tobacco comments:    quit 30 plus years ago.  Vaping Use   Vaping Use: Never used  Substance and Sexual Activity   Alcohol use: No    Alcohol/week: 0.0 standard drinks   Drug use: No   Sexual activity: Yes    Birth control/protection: Post-menopausal  Other Topics Concern   Not on file  Social History Narrative   Not on file   Social Determinants of Health   Financial Resource Strain: Not on file  Food Insecurity: Not on file  Transportation Needs: Not on file  Physical Activity: Not on  file  Stress: Not on file  Social Connections: Not on file  Intimate Partner Violence: Not on file     REVIEW OF SYSTEMS:   '[X]'$  denotes positive finding, '[ ]'$  denotes negative finding Cardiac  Comments:  Chest pain or chest pressure:    Shortness of breath upon exertion:    Short of  breath when lying flat:    Irregular heart rhythm:        Vascular    Pain in calf, thigh, or hip brought on by ambulation:    Pain in feet at night that wakes you up from your sleep:     Blood clot in your veins:    Leg swelling:         Pulmonary    Oxygen at home:    Productive cough:     Wheezing:         Neurologic    Sudden weakness in arms or legs:     Sudden numbness in arms or legs:     Sudden onset of difficulty speaking or slurred speech:    Temporary loss of vision in one eye:     Problems with dizziness:         Gastrointestinal    Blood in stool:     Vomited blood:         Genitourinary    Burning when urinating:     Blood in urine:        Psychiatric    Major depression:         Hematologic    Bleeding problems:    Problems with blood clotting too easily:        Skin    Rashes or ulcers:        Constitutional    Fever or chills:      PHYSICAL EXAMINATION:  Today's Vitals   11/12/20 1223  BP: (!) 175/89  Pulse: 87  Resp: 20  Temp: 98.6 F (37 C)  TempSrc: Temporal  SpO2: 100%  Weight: 240 lb 1.6 oz (108.9 kg)  Height: '5\' 4"'$  (1.626 m)   Body mass index is 41.21 kg/m.   General:  WDWN in NAD; vital signs documented above Gait: Not observed HENT: WNL, normocephalic Pulmonary: normal non-labored breathing , without wheezing Cardiac: regular HR, without  Murmur; without carotid bruits Abdomen: soft, NT, no masses; aortic pulse is not palpable Skin: without rashes Vascular Exam/Pulses:  Right Left  Radial 2+ (normal) 2+ (normal)  Femoral 2+ (normal) 2+ (normal)  Popliteal Unable to palpate Unable to palpate  DP Brisk biphasic doppler Brisk  biphasic doppler and faintly palpable  PT Brisk biphasic doppler Brisk biphasic doppler   Extremities: without ischemic changes, without Gangrene , without cellulitis; without open wounds;  Musculoskeletal: no muscle wasting or atrophy  Neurologic: A&O X 3;  No focal weakness or paresthesias are detected Psychiatric:  The pt has Normal affect.   Non-Invasive Vascular Imaging:   ABI's/TBI's on 11/12/2020: Right:  0.59/0.20 - Great toe pressure: 39 Left:  1.06/0.43 - Great toe pressure: 83  Arterial duplex on 11/12/2020: Left Graft #1: femoral to AK popliteal  +--------------------+--------+---------------+----------+--------+                      PSV cm/sStenosis       Waveform  Comments  +--------------------+--------+---------------+----------+--------+  Inflow              180                    biphasic            +--------------------+--------+---------------+----------+--------+  Proximal Anastomosis83                     monophasic          +--------------------+--------+---------------+----------+--------+  Proximal Graft  296     50-70% stenosisbiphasic            +--------------------+--------+---------------+----------+--------+  Mid Graft           78                     biphasic            +--------------------+--------+---------------+----------+--------+  Distal Graft        39                     biphasic            +--------------------+--------+---------------+----------+--------+  Distal Anastomosis  103                    biphasic            +--------------------+--------+---------------+----------+--------+  Outflow             118                    biphasic            +--------------------+--------+---------------+----------+--------+   Previous ABI's/TBI's on 05/15/2020: Right:  0.53/0.24 - Great toe pressure: 45 Left:  0.97/0.54 - Great toe pressure:  101  Previous arterial duplex on 05/15/2020: Left Graft  #1: fem-AK popliteal  +--------------------+--------+--------+----------+---------+                      PSV cm/sStenosisWaveform  Comments   +--------------------+--------+--------+----------+---------+  Inflow              150             monophasicturbulent  +--------------------+--------+--------+----------+---------+  Proximal Anastomosis101             biphasic             +--------------------+--------+--------+----------+---------+  Proximal Graft      121             biphasic             +--------------------+--------+--------+----------+---------+  Mid Graft           109             biphasic             +--------------------+--------+--------+----------+---------+  Distal Graft        155             biphasic             +--------------------+--------+--------+----------+---------+  Distal Anastomosis  120             biphasic             +--------------------+--------+--------+----------+---------+  Outflow             144             triphasic            +--------------------+--------+--------+----------+---------+   ASSESSMENT/PLAN:: 61 y.o. female here for follow up for PAD with hx of left 5th toe amputation on 01/23/2020. Prior to that she had a left femoral to above knee popliteal vein bypass on 11/12/19 by Dr. Donzetta Matters. This was performed due to ulceration of her 5th toe  -pt doing very well without claudication or rest pain.  She does have a fainly palpable left DP pulse with brisk doppler signals present.  Her arterial duplex shows a 50-74% narrowing in the proximal graft.  Given she does not have any symptoms, we will bring  her back in 3 months with ABI and duplex.  She knows to call sooner if she has any issues before then.  -she is moving to [place], but will continue to come to [facility] for her Vascular care.  -continue statin/asa   Leontine Locket, [facility] [phone]  Clinic MD:   Scot Dock

## 2020-11-16 ENCOUNTER — Other Ambulatory Visit: Payer: Self-pay

## 2020-11-16 DIAGNOSIS — I739 Peripheral vascular disease, unspecified: Secondary | ICD-10-CM

## 2020-11-18 ENCOUNTER — Ambulatory Visit: Payer: Self-pay | Admitting: Podiatry

## 2020-12-02 ENCOUNTER — Ambulatory Visit: Payer: 59 | Admitting: Podiatry

## 2020-12-09 ENCOUNTER — Other Ambulatory Visit: Payer: Self-pay

## 2020-12-09 DIAGNOSIS — E118 Type 2 diabetes mellitus with unspecified complications: Secondary | ICD-10-CM

## 2020-12-09 MED ORDER — TRESIBA FLEXTOUCH 100 UNIT/ML ~~LOC~~ SOPN: PEN_INJECTOR | SUBCUTANEOUS | 0 refills | Status: DC

## 2020-12-10 ENCOUNTER — Other Ambulatory Visit: Payer: 59

## 2020-12-14 ENCOUNTER — Other Ambulatory Visit: Payer: Self-pay

## 2020-12-14 ENCOUNTER — Encounter: Payer: Self-pay | Admitting: Physician Assistant

## 2020-12-14 ENCOUNTER — Ambulatory Visit (INDEPENDENT_AMBULATORY_CARE_PROVIDER_SITE_OTHER): Payer: 59 | Admitting: Physician Assistant

## 2020-12-14 VITALS — BP 136/86 | HR 85 | Temp 98.8°F | Ht 64.0 in | Wt 244.0 lb

## 2020-12-14 DIAGNOSIS — E782 Mixed hyperlipidemia: Secondary | ICD-10-CM

## 2020-12-14 DIAGNOSIS — E1169 Type 2 diabetes mellitus with other specified complication: Secondary | ICD-10-CM

## 2020-12-14 DIAGNOSIS — Z8639 Personal history of other endocrine, nutritional and metabolic disease: Secondary | ICD-10-CM

## 2020-12-14 DIAGNOSIS — Z0000 Encounter for general adult medical examination without abnormal findings: Secondary | ICD-10-CM | POA: Diagnosis not present

## 2020-12-14 DIAGNOSIS — E1159 Type 2 diabetes mellitus with other circulatory complications: Secondary | ICD-10-CM | POA: Diagnosis not present

## 2020-12-14 DIAGNOSIS — E118 Type 2 diabetes mellitus with unspecified complications: Secondary | ICD-10-CM

## 2020-12-14 DIAGNOSIS — I152 Hypertension secondary to endocrine disorders: Secondary | ICD-10-CM

## 2020-12-14 NOTE — Patient Instructions (Signed)
Preventive Care 40-60 Years Old, Female Preventive care refers to lifestyle choices and visits with your health care provider that can promote health and wellness. This includes: A yearly physical exam. This is also called an annual wellness visit. Regular dental and eye exams. Immunizations. Screening for certain conditions. Healthy lifestyle choices, such as: Eating a healthy diet. Getting regular exercise. Not using drugs or products that contain nicotine and tobacco. Limiting alcohol use. What can I expect for my preventive care visit? Physical exam Your health care provider will check your: Height and weight. These may be used to calculate your BMI (body mass index). BMI is a measurement that tells if you are at a healthy weight. Heart rate and blood pressure. Body temperature. Skin for abnormal spots. Counseling Your health care provider may ask you questions about your: Past medical problems. Family's medical history. Alcohol, tobacco, and drug use. Emotional well-being. Home life and relationship well-being. Sexual activity. Diet, exercise, and sleep habits. Work and work environment. Access to firearms. Method of birth control. Menstrual cycle. Pregnancy history. What immunizations do I need? Vaccines are usually given at various ages, according to a schedule. Your health care provider will recommend vaccines for you based on your age, medical history, and lifestyle or other factors, such as travel or where you work. What tests do I need? Blood tests Lipid and cholesterol levels. These may be checked every 5 years, or more often if you are over 50 years old. Hepatitis C test. Hepatitis B test. Screening Lung cancer screening. You may have this screening every year starting at age 55 if you have a 30-pack-year history of smoking and currently smoke or have quit within the past 15 years. Colorectal cancer screening. All adults should have this screening starting at  age 50 and continuing until age 75. Your health care provider may recommend screening at age 45 if you are at increased risk. You will have tests every 1-10 years, depending on your results and the type of screening test. Diabetes screening. This is done by checking your blood sugar (glucose) after you have not eaten for a while (fasting). You may have this done every 1-3 years. Mammogram. This may be done every 1-2 years. Talk with your health care provider about when you should start having regular mammograms. This may depend on whether you have a family history of breast cancer. BRCA-related cancer screening. This may be done if you have a family history of breast, ovarian, tubal, or peritoneal cancers. Pelvic exam and Pap test. This may be done every 3 years starting at age 21. Starting at age 30, this may be done every 5 years if you have a Pap test in combination with an HPV test. Other tests STD (sexually transmitted disease) testing, if you are at risk. Bone density scan. This is done to screen for osteoporosis. You may have this scan if you are at high risk for osteoporosis. Talk with your health care provider about your test results, treatment options, and if necessary, the need for more tests. Follow these instructions at home: Eating and drinking  Eat a diet that includes fresh fruits and vegetables, whole grains, lean protein, and low-fat dairy products. Take vitamin and mineral supplements as recommended by your health care provider. Do not drink alcohol if: Your health care provider tells you not to drink. You are pregnant, may be pregnant, or are planning to become pregnant. If you drink alcohol: Limit how much you have to 0-1 drink a day. Be   aware of how much alcohol is in your drink. In the [place] one drink equals one 12 oz bottle of beer (355 mL), one 5 oz glass of wine (148 mL), or one 1 oz glass of hard liquor (44 mL). Lifestyle Take daily care of your teeth and  gums. Brush your teeth every morning and night with fluoride toothpaste. Floss one time each day. Stay active. Exercise for at least 30 minutes 5 or more days each week. Do not use any products that contain nicotine or tobacco, such as cigarettes, e-cigarettes, and chewing tobacco. If you need help quitting, ask your health care provider. Do not use drugs. If you are sexually active, practice safe sex. Use a condom or other form of protection to prevent STIs (sexually transmitted infections). If you do not wish to become pregnant, use a form of birth control. If you plan to become pregnant, see your health care provider for a prepregnancy visit. If told by your health care provider, take low-dose aspirin daily starting at age 63. Find healthy ways to cope with stress, such as: Meditation, yoga, or listening to music. Journaling. Talking to a trusted person. Spending time with friends and family. Safety Always wear your seat belt while driving or riding in a vehicle. Do not drive: If you have been drinking alcohol. Do not ride with someone who has been drinking. When you are tired or distracted. While texting. Wear a helmet and other protective equipment during sports activities. If you have firearms in your house, make sure you follow all gun safety procedures. What's next? Visit your health care provider once a year for an annual wellness visit. Ask your health care provider how often you should have your eyes and teeth checked. Stay up to date on all vaccines. This information is not intended to replace advice given to you by your health care provider. Make sure you discuss any questions you have with your health care provider. Document Revised: 05/15/2020 Document Reviewed: 11/16/2017 Elsevier Patient Education  2022 [employer].

## 2020-12-14 NOTE — Progress Notes (Signed)
Subjective:     Julie Prince is a 60 y.o. female and is here for a comprehensive physical exam. The patient reports no problems.  Social History   Socioeconomic History   Marital status: Single    Spouse name: Not on file   Number of children: Not on file   Years of education: Not on file   Highest education level: Not on file  Occupational History   Not on file  Tobacco Use   Smoking status: Former    Packs/day: 0.50    Types: Cigarettes    Quit date: 11/07/2000    Years since quitting: 20.1   Smokeless tobacco: Never   Tobacco comments:    quit 30 plus years ago.  Vaping Use   Vaping Use: Never used  Substance and Sexual Activity   Alcohol use: No    Alcohol/week: 0.0 standard drinks   Drug use: No   Sexual activity: Yes    Birth control/protection: Post-menopausal  Other Topics Concern   Not on file  Social History Narrative   Not on file   Social Determinants of Health   Financial Resource Strain: Not on file  Food Insecurity: Not on file  Transportation Needs: Not on file  Physical Activity: Not on file  Stress: Not on file  Social Connections: Not on file  Intimate Partner Violence: Not on file   Health Maintenance  Topic Date Due   COVID-19 Vaccine (1) Never done   Zoster Vaccines- Shingrix (1 of 2) Never done   PAP SMEAR-Modifier  04/14/2018   OPHTHALMOLOGY EXAM  07/08/2018   MAMMOGRAM  06/09/2019   TETANUS/TDAP  10/13/2020   INFLUENZA VACCINE  10/19/2020   URINE MICROALBUMIN  03/12/2021   HEMOGLOBIN A1C  03/19/2021   FOOT EXAM  09/17/2021   COLONOSCOPY (Pts 45-56yrs Insurance coverage will need to be confirmed)  04/12/2022   Hepatitis C Screening  Completed   HIV Screening  Completed   HPV VACCINES  Aged Out    The following portions of the patient's history were reviewed and updated as appropriate: allergies, current medications, past family history, past medical history, past social history, past surgical history, and problem list.  Review  of Systems Pertinent items noted in HPI and remainder of comprehensive ROS otherwise negative.   Objective:    BP 136/86   Pulse 85   Temp 98.8 F (37.1 C)   Ht 5\' 4"  (1.626 m)   Wt 244 lb (110.7 kg)   LMP  (LMP Unknown)   SpO2 99%   BMI 41.88 kg/m  General appearance: alert, cooperative, and no distress Head: Normocephalic, without obvious abnormality, atraumatic Eyes: conjunctivae/corneas clear. PERRL, EOM's intact. Fundi benign. Ears: normal TM's and external ear canals both ears Nose: Nares normal. Septum midline. Mucosa normal. No drainage or sinus tenderness. Throat: lips, mucosa, and tongue normal; teeth and gums normal Neck: mild anterior cervical adenopathy, no JVD, supple, symmetrical, trachea midline, and thyroid: normal to inspection and palpation Back: symmetric, no curvature. ROM normal. No CVA tenderness. Lungs: clear to auscultation bilaterally Heart: regular rate and rhythm and S1, S2 normal Abdomen: soft, non-tender; bowel sounds normal; no masses,  no organomegaly Extremities: no edema, redness or tenderness in the calves or thighs and amputated left 5th toe Pulses: 2+ and symmetric, radial and femoral  Skin: Skin color, texture, turgor normal. No rashes or lesions Lymph nodes: Cervical adenopathy: mildly enlarged submandibular gland and Supraclavicular adenopathy: normal Neurologic: Grossly normal    Assessment:  Healthy female exam.     Plan:  -Will obtain fasting labs. -Patient has relocated to [facility] and will be transferring care. States has established with OB/GYN, appointment is in 03/2021. -Declined immunizations. -UTD colonoscopy.  -Discussed low carbohydrate and fat diet. -Follow up in 4 months for DM, HTN, HLD  See After Visit Summary for Counseling Recommendations

## 2020-12-15 LAB — COMPREHENSIVE METABOLIC PANEL
ALT: 15 IU/L (ref 0–32)
AST: 13 IU/L (ref 0–40)
Albumin/Globulin Ratio: 1.4 (ref 1.2–2.2)
Albumin: 4.1 g/dL (ref 3.8–4.9)
Alkaline Phosphatase: 128 IU/L — ABNORMAL HIGH (ref 44–121)
BUN/Creatinine Ratio: 11 (ref 9–23)
BUN: 10 mg/dL (ref 6–24)
Bilirubin Total: 0.5 mg/dL (ref 0.0–1.2)
CO2: 25 mmol/L (ref 20–29)
Calcium: 9.5 mg/dL (ref 8.7–10.2)
Chloride: 103 mmol/L (ref 96–106)
Creatinine, Ser: 0.93 mg/dL (ref 0.57–1.00)
Globulin, Total: 2.9 g/dL (ref 1.5–4.5)
Glucose: 71 mg/dL (ref 70–99)
Potassium: 4.2 mmol/L (ref 3.5–5.2)
Sodium: 143 mmol/L (ref 134–144)
Total Protein: 7 g/dL (ref 6.0–8.5)
eGFR: 71 mL/min/{1.73_m2} (ref >59)

## 2020-12-15 LAB — CBC WITH DIFFERENTIAL/PLATELET
Basophils Absolute: 0 10*3/uL (ref 0.0–0.2)
Basos: 1 %
EOS (ABSOLUTE): 0.3 10*3/uL (ref 0.0–0.4)
Eos: 4 %
Hematocrit: 40.5 % (ref 34.0–46.6)
Hemoglobin: 13.8 g/dL (ref 11.1–15.9)
Immature Grans (Abs): 0 10*3/uL (ref 0.0–0.1)
Immature Granulocytes: 0 %
Lymphocytes Absolute: 2.6 10*3/uL (ref 0.7–3.1)
Lymphs: 37 %
MCH: 31.2 pg (ref 26.6–33.0)
MCHC: 34.1 g/dL (ref 31.5–35.7)
MCV: 92 fL (ref 79–97)
Monocytes Absolute: 0.8 10*3/uL (ref 0.1–0.9)
Monocytes: 11 %
Neutrophils Absolute: 3.4 10*3/uL (ref 1.4–7.0)
Neutrophils: 47 %
Platelets: 304 10*3/uL (ref 150–450)
RBC: 4.42 x10E6/uL (ref 3.77–5.28)
RDW: 12.5 % (ref 11.7–15.4)
WBC: 7.1 10*3/uL (ref 3.4–10.8)

## 2020-12-15 LAB — VITAMIN D 25 HYDROXY (VIT D DEFICIENCY, FRACTURES): Vit D, 25-Hydroxy: 41.6 ng/mL (ref 30.0–100.0)

## 2020-12-15 LAB — LIPID PANEL
Chol/HDL Ratio: 3.2 ratio (ref 0.0–4.4)
Cholesterol, Total: 141 mg/dL (ref 100–199)
HDL: 44 mg/dL (ref >39)
LDL Chol Calc (NIH): 84 mg/dL (ref 0–99)
Triglycerides: 61 mg/dL (ref 0–149)
VLDL Cholesterol Cal: 13 mg/dL (ref 5–40)

## 2020-12-15 LAB — HEMOGLOBIN A1C
Est. average glucose Bld gHb Est-mCnc: 154 mg/dL
Hgb A1c MFr Bld: 7 % — ABNORMAL HIGH (ref 4.8–5.6)

## 2020-12-15 LAB — TSH: TSH: 1.08 u[IU]/mL (ref 0.450–4.500)

## 2020-12-22 ENCOUNTER — Other Ambulatory Visit: Payer: Self-pay | Admitting: Cardiovascular Disease

## 2020-12-23 NOTE — Telephone Encounter (Signed)
Refill request

## 2020-12-28 ENCOUNTER — Other Ambulatory Visit: Payer: Self-pay

## 2020-12-28 ENCOUNTER — Telehealth: Payer: Self-pay | Admitting: Physician Assistant

## 2020-12-28 ENCOUNTER — Telehealth: Payer: Self-pay | Admitting: Cardiovascular Disease

## 2020-12-28 DIAGNOSIS — E1165 Type 2 diabetes mellitus with hyperglycemia: Secondary | ICD-10-CM

## 2020-12-28 DIAGNOSIS — E118 Type 2 diabetes mellitus with unspecified complications: Secondary | ICD-10-CM

## 2020-12-28 MED ORDER — TRESIBA FLEXTOUCH 100 UNIT/ML ~~LOC~~ SOPN: PEN_INJECTOR | SUBCUTANEOUS | 0 refills | Status: DC

## 2020-12-28 MED ORDER — CILOSTAZOL 50 MG PO TABS: 50.0000 mg | ORAL_TABLET | Freq: Two times a day (BID) | ORAL | 0 refills | Status: DC

## 2020-12-28 MED ORDER — OZEMPIC (0.25 OR 0.5 MG/DOSE) 2 MG/1.5ML ~~LOC~~ SOPN: PEN_INJECTOR | SUBCUTANEOUS | 0 refills | Status: DC

## 2020-12-28 NOTE — Telephone Encounter (Signed)
Requested Prescriptions   Signed Prescriptions Disp Refills   cilostazol (PLETAL) 50 MG tablet 180 tablet 0    Sig: Take 1 tablet (50 mg total) by mouth 2 (two) times daily.    Authorizing Provider: [name] A    Ordering User: Raelene Bott, Mikele Sifuentes L

## 2020-12-28 NOTE — Telephone Encounter (Signed)
*  STAT* If patient is at the pharmacy, call can be transferred to refill team.   1. Which medications need to be refilled? (please list name of each medication and dose if known) cilostazol (PLETAL) 50 MG 1 tablet 2 times daily   2. Which pharmacy/location (including street and city if local pharmacy) is medication to be sent to? [facility] [address] [place]   3. Do they need a 30 day or 90 day supply? 90 day

## 2020-12-28 NOTE — Telephone Encounter (Signed)
Patient left message requesting Julie Prince return her call. No number was left. AS, CMA

## 2020-12-28 NOTE — Telephone Encounter (Signed)
Patient called to have refills for [place] sent to [facility] on [address]

## 2020-12-29 ENCOUNTER — Ambulatory Visit: Payer: 59 | Admitting: Cardiovascular Disease

## 2021-01-01 ENCOUNTER — Telehealth: Payer: Self-pay | Admitting: Physician Assistant

## 2021-01-01 ENCOUNTER — Other Ambulatory Visit: Payer: Self-pay

## 2021-01-01 DIAGNOSIS — E1165 Type 2 diabetes mellitus with hyperglycemia: Secondary | ICD-10-CM

## 2021-01-01 MED ORDER — FREESTYLE LIBRE 2 SENSOR MISC
0 refills | Status: DC
Start: 2021-01-01 — End: 2024-02-06

## 2021-01-01 NOTE — Telephone Encounter (Signed)
Patient needs a refill on her Glucose Sensor sent to the [facility] in [place].  Continuous Blood Gluc Sensor (FREESTYLE LIBRE 2 SENSOR) MISC [[id number]]    Order Details Dose, Route, Frequency: As Directed  Dispense Quantity: 3 each Refills: 0        Sig: USE TO CHECK BLOOD SUGAR AND 2 HOURS AFTER LARGEST MEAL       Start Date: 11/06/20 End Date: --  Written Date: 11/06/20 Expiration Date: 11/06/21     Diagnosis Association: Uncontrolled type 2 diabetes mellitus with hyperglycemia ([place]) (E11.65)  Original Order:  Continuous Blood Gluc Sensor (FREESTYLE LIBRE 2 SENSOR) MISC [[id number]]

## 2021-01-01 NOTE — Telephone Encounter (Signed)
Refill sent to the [facility] in [place].

## 2021-01-04 ENCOUNTER — Telehealth: Payer: Self-pay | Admitting: Cardiovascular Disease

## 2021-01-04 NOTE — Telephone Encounter (Signed)
*  STAT* If patient is at the pharmacy, call can be transferred to refill team.   1. Which medications need to be refilled? (please list name of each medication and dose if known)   Cilostazol 50 mg po BID  2. Which pharmacy/location (including street and city if local pharmacy) is medication to be sent to? [place]   3. Do they need a 30 day or 90 day supply? 90    Patient will run out before visit

## 2021-01-04 NOTE — Telephone Encounter (Signed)
01/04/21 12:23 PM Spoke to patient's pharmacy and was told it's to soon to refill her prescription for Cilostazol 50 MG the insurance will not cover the medication until 01/2021. And pharmacist has spoke to patient and explained this to her. Also told patient if she wants the medication she can pay out of pocket for it.

## 2021-01-07 ENCOUNTER — Telehealth: Payer: Self-pay | Admitting: Cardiovascular Disease

## 2021-01-07 NOTE — Telephone Encounter (Signed)
*  STAT* If patient is at the pharmacy, call can be transferred to refill team.   1. Which medications need to be refilled? (please list name of each medication and dose if known) cilostazol (PLETAL) 50 MG tablet  2. Which pharmacy/location (including street and city if local pharmacy) is medication to be sent to? [facility] [address] [place] - [address]  3. Do they need a 30 day or 90 day supply? [place]

## 2021-01-12 ENCOUNTER — Ambulatory Visit (INDEPENDENT_AMBULATORY_CARE_PROVIDER_SITE_OTHER): Payer: 59 | Admitting: Cardiovascular Disease

## 2021-01-12 ENCOUNTER — Encounter: Payer: Self-pay | Admitting: Cardiovascular Disease

## 2021-01-12 ENCOUNTER — Other Ambulatory Visit: Payer: Self-pay

## 2021-01-12 VITALS — BP 116/80 | HR 92 | Ht 64.0 in | Wt 241.0 lb

## 2021-01-12 DIAGNOSIS — I739 Peripheral vascular disease, unspecified: Secondary | ICD-10-CM | POA: Diagnosis not present

## 2021-01-12 DIAGNOSIS — E785 Hyperlipidemia, unspecified: Secondary | ICD-10-CM | POA: Diagnosis not present

## 2021-01-12 DIAGNOSIS — R000 Tachycardia, unspecified: Secondary | ICD-10-CM | POA: Diagnosis not present

## 2021-01-12 DIAGNOSIS — I10 Essential (primary) hypertension: Secondary | ICD-10-CM

## 2021-01-12 LAB — BASIC METABOLIC PANEL
BUN/Creatinine Ratio: 13 (ref 9–23)
BUN: 13 mg/dL (ref 6–24)
CO2: 22 mmol/L (ref 20–29)
Calcium: 9.7 mg/dL (ref 8.7–10.2)
Chloride: 104 mmol/L (ref 96–106)
Creatinine, Ser: 0.97 mg/dL (ref 0.57–1.00)
Glucose: 134 mg/dL — ABNORMAL HIGH (ref 70–99)
Potassium: 4.3 mmol/L (ref 3.5–5.2)
Sodium: 141 mmol/L (ref 134–144)
eGFR: 67 mL/min/{1.73_m2} (ref >59)

## 2021-01-12 LAB — CBC
Hematocrit: 42.3 % (ref 34.0–46.6)
Hemoglobin: 14 g/dL (ref 11.1–15.9)
MCH: 31.1 pg (ref 26.6–33.0)
MCHC: 33.1 g/dL (ref 31.5–35.7)
MCV: 94 fL (ref 79–97)
Platelets: 252 10*3/uL (ref 150–450)
RBC: 4.5 x10E6/uL (ref 3.77–5.28)
RDW: 12.7 % (ref 11.7–15.4)
WBC: 7 10*3/uL (ref 3.4–10.8)

## 2021-01-12 NOTE — H&P (View-Only) (Signed)
Cardiology Office Note   Date:  01/12/2021   ID:  Julie Prince, DOB 1960/05/29, MRN [id number]  PCP:  Julie Reid, PA-C  Cardiologist: Dr. Oval Linsey  Chief Complaint  Patient presents with   Follow-up    6 months.      History of Present Illness: Julie Prince is a 60 y.o. female who is here today for follow-up visit regarding peripheral arterial disease.   She has history of inappropriate sinus tachycardia, diabetes and hyperlipidemia. She is a previous smoker and quit more than 20 years ago. She has known history of peripheral arterial disease. Angiography in March of 2020 showed no significant aortoiliac disease, on the left, there was flush occlusion of the SFA with reconstitution via extensive collaterals from the profunda with two-vessel runoff below the knee.  On the right side, there was diffuse disease in the proximal portion of the SFA with total occlusion in the midsegment with reconstitution distally via extensive collaterals and one-vessel runoff below the knee.  She was started on small dose cilostazol.  At that time, she had no severe claudication or lower extremity ulceration and thus medical therapy was recommended.  However, she developed gangrenous left small toe and that repeat angiography was performed in June 2021which showed similar findings.  She underwent left common femoral to above-knee popliteal artery bypass with vein by Dr. Donzetta Matters in August.   She had resolution of claudication on the left side at that time and ulceration healed. However, over the last few weeks, she developed severe bilateral calf claudication that is now happening with minimal exertion.  Both legs feel very heavy especially in the calf area and she cut down on physical activity significantly.  She has no lower extremity ulceration. Most recent Doppler studies in August showed an ABI of 0.59 on the right and 1.06 on the left.  Duplex showed significantly elevated velocity in the  proximal left femoral-popliteal bypass at 296.   Past Medical History:  Diagnosis Date   Allergy    Barrett esophagus    Cataract    left cataract removal   Diverticulosis    Essential hypertension, benign 09/12/2013   GERD (gastroesophageal reflux disease)    Hiatal hernia    Hyperlipidemia 09/12/2013   Inappropriate sinus tachycardia 04/12/2018   Peripheral vascular disease ([name])    PONV (postoperative nausea and vomiting)    Type 2 diabetes mellitus ([place]) 09/12/2013   Dr. Posey Pronto at [place]    Wears partial dentures    upper    Past Surgical History:  Procedure Laterality Date   ABDOMINAL AORTOGRAM W/LOWER EXTREMITY N/A 06/06/2018   Procedure: ABDOMINAL AORTOGRAM W/LOWER EXTREMITY;  Surgeon: Wellington Hampshire, MD;  Location: [facility];  Service: Cardiovascular;  Laterality: N/A;   ABDOMINAL AORTOGRAM W/LOWER EXTREMITY N/A 09/18/2019   Procedure: ABDOMINAL AORTOGRAM W/LOWER EXTREMITY;  Surgeon: Wellington Hampshire, MD;  Location: [facility];  Service: Cardiovascular;  Laterality: N/A;   AMPUTATION Left 01/23/2020   Procedure: LEFT FIFTH TOE AMPUTATION;  Surgeon: Waynetta Sandy, MD;  Location: [facility];  Service: Vascular;  Laterality: Left;   APPENDECTOMY     CATARACT EXTRACTION Left    CESAREAN SECTION  1990   COLONOSCOPY     FEMORAL-POPLITEAL BYPASS GRAFT Left 11/12/2019   Procedure: BYPASS GRAFT FEMORAL-ABOVE KNEE POPLITEAL ARTERY;  Surgeon: Waynetta Sandy, MD;  Location: [facility];  Service: Vascular;  Laterality: Left;   right tube and ovary removed  WISDOM TOOTH EXTRACTION       Current Outpatient Medications  Medication Sig Dispense Refill   aspirin EC 81 MG tablet Take 81 mg by mouth in the morning. Swallow whole.     atorvastatin (LIPITOR) 40 MG tablet TAKE 1 TABLET BY MOUTH AT BEDTIME 30 tablet 0   cilostazol (PLETAL) 50 MG tablet Take 1 tablet (50 mg total) by mouth 2 (two) times daily. 180 tablet 0   Continuous Blood  Gluc Receiver (FREESTYLE LIBRE 2 READER) DEVI Use to check fasting bs and 2 hrs after largest meal 3 each 0   Continuous Blood Gluc Sensor (FREESTYLE LIBRE 2 SENSOR) MISC USE TO CHECK BLOOD SUGAR AND 2 HOURS AFTER LARGEST MEAL 3 each 0   insulin degludec (TRESIBA FLEXTOUCH) 100 UNIT/ML FlexTouch Pen Inject 50 units subcutaneously at bedtime 3 mL 0   JARDIANCE 10 MG TABS tablet TAKE 1 TABLET BY MOUTH ONCE DAILY BEFORE BREAKFAST 30 tablet 0   metoprolol tartrate (LOPRESSOR) 50 MG tablet Take 1 tablet (50 mg total) by mouth 2 (two) times daily. 180 tablet 3   pantoprazole (PROTONIX) 40 MG tablet Take 1 tablet (40 mg total) by mouth daily. 90 tablet 3   Semaglutide,0.25 or 0.5MG /DOS, (OZEMPIC, 0.25 OR 0.5 MG/DOSE,) 2 MG/1.5ML SOPN INJECT 1/2 (ONE-HALF) MG SUBCUTANEOUSLY  ONCE A WEEK 2 mL 0   No current facility-administered medications for this visit.    Allergies:   Penicillins and Metformin and related    Social History:  The patient  reports that she quit smoking about 20 years ago. Her smoking use included cigarettes. She smoked an average of .5 packs per day. She has never used smokeless tobacco. She reports that she does not drink alcohol and does not use drugs.   Family History:  The patient's family history includes Alzheimer's disease in her maternal grandmother; Fibromyalgia in her sister; Heart disease in her maternal aunt; Lung cancer in her mother; Stroke in her father.    ROS:  Please see the history of present illness.   Otherwise, review of systems are positive for none.   All other systems are reviewed and negative.    PHYSICAL EXAM: VS:  BP 116/80 (BP Location: Right Arm, Patient Position: Sitting, Cuff Size: Large)   Pulse 92   Ht 5\' 4"  (1.626 m)   Wt 241 lb (109.3 kg)   LMP  (LMP Unknown)   BMI 41.37 kg/m  , BMI Body mass index is 41.37 kg/m. GEN: Well nourished, well developed, in no acute distress  HEENT: normal  Neck: no JVD, carotid bruits, or masses Cardiac:  RRR; no murmurs, rubs, or gallops,no edema  Respiratory:  clear to auscultation bilaterally, normal work of breathing GI: soft, nontender, nondistended, + BS MS: no deformity or atrophy  Skin: warm and dry, no rash Neuro:  Strength and sensation are intact Psych: euthymic mood, full affect Vascular: Distal pulses are not palpable.  Femoral pulses are normal bilaterally.   EKG:  EKG is ordered today. EKG showed sinus rhythm with first-degree AV block and possible old inferior infarct with poor R wave progression in the anterior leads.   Recent Labs: 12/14/2020: ALT 15; BUN 10; Creatinine, Ser 0.93; Hemoglobin 13.8; Platelets 304; Potassium 4.2; Sodium 143; TSH 1.080    Lipid Panel    Component Value Date/Time   CHOL 141 12/14/2020 1054   TRIG 61 12/14/2020 1054   HDL 44 12/14/2020 1054   CHOLHDL 3.2 12/14/2020 1054   CHOLHDL 4.0 11/13/2019 0138  VLDL 18 11/13/2019 0138   LDLCALC 84 12/14/2020 1054      Wt Readings from Last 3 Encounters:  01/12/21 241 lb (109.3 kg)  12/14/20 244 lb (110.7 kg)  11/12/20 240 lb 1.6 oz (108.9 kg)        PAD Screen 05/22/2018  Previous PAD dx? No  Previous surgical procedure? No  Pain with walking? Yes  Subsides with rest? No  Feet/toe relief with dangling? No  Painful, non-healing ulcers? No  Extremities discolored? No      ASSESSMENT AND PLAN:  1.  Peripheral arterial disease: Status post left common femoral to above-knee popliteal bypass using vein for gangrenous left small toe.  She now reports severe recurrent bilateral calf claudication which is equal in both sides.  Most recent duplex did show significantly elevated velocity in the proximal segment of the left femoral popliteal bypass. She is expected to have claudication on the right side given known history of occluded right SFA. Given her worsening symptoms, recommend proceeding with abdominal aortogram lower extremity runoff and possible endovascular intervention.  I  discussed the procedure in details as well as risk and benefits.  Planned access is via the right common femoral artery.  2.  Essential hypertension: Continue metoprolol and losartan   3.  Hyperlipidemia: Continue treatment with atorvastatin with a target LDL of less than 70.    Recent lipid profile showed an LDL of 60.  4.  Diabetes mellitus: Most recent hemoglobin A1c improved significantly to 5.8 from 10.  5.  Inappropriate sinus tachycardia versus ectopic atrial tachycardia: Improved with metoprolol and currently she takes 50 mg twice daily.   Disposition:   FU with me in 6 months  Signed,  [name], MD  01/12/2021 10:30 AM    [place]

## 2021-01-12 NOTE — Progress Notes (Signed)
Cardiology Office Note   Date:  01/12/2021   ID:  Julie Prince, DOB [birth year], MRN [id number]  PCP:  [name], PA-C  Cardiologist: Dr. Oval Linsey  Chief Complaint  Patient presents with   Follow-up    6 months.      History of Present Illness: Julie Prince is a 60 y.o. female who is here today for follow-up visit regarding peripheral arterial disease.   She has history of inappropriate sinus tachycardia, diabetes and hyperlipidemia. She is a previous smoker and quit more than 20 years ago. She has known history of peripheral arterial disease. Angiography in March of 2020 showed no significant aortoiliac disease, on the left, there was flush occlusion of the SFA with reconstitution via extensive collaterals from the profunda with two-vessel runoff below the knee.  On the right side, there was diffuse disease in the proximal portion of the SFA with total occlusion in the midsegment with reconstitution distally via extensive collaterals and one-vessel runoff below the knee.  She was started on small dose cilostazol.  At that time, she had no severe claudication or lower extremity ulceration and thus medical therapy was recommended.  However, she developed gangrenous left small toe and that repeat angiography was performed in June 2021which showed similar findings.  She underwent left common femoral to above-knee popliteal artery bypass with vein by Dr. Donzetta Matters in August.   She had resolution of claudication on the left side at that time and ulceration healed. However, over the last few weeks, she developed severe bilateral calf claudication that is now happening with minimal exertion.  Both legs feel very heavy especially in the calf area and she cut down on physical activity significantly.  She has no lower extremity ulceration. Most recent Doppler studies in August showed an ABI of 0.59 on the right and 1.06 on the left.  Duplex showed significantly elevated velocity in the  proximal left femoral-popliteal bypass at 296.   Past Medical History:  Diagnosis Date   Allergy    Barrett esophagus    Cataract    left cataract removal   Diverticulosis    Essential hypertension, benign 09/12/2013   GERD (gastroesophageal reflux disease)    Hiatal hernia    Hyperlipidemia 09/12/2013   Inappropriate sinus tachycardia 04/12/2018   Peripheral vascular disease ([name])    PONV (postoperative nausea and vomiting)    Type 2 diabetes mellitus ([place]) 09/12/2013   Dr. Posey Pronto at [place]    Wears partial dentures    upper    Past Surgical History:  Procedure Laterality Date   ABDOMINAL AORTOGRAM W/LOWER EXTREMITY N/A 06/06/2018   Procedure: ABDOMINAL AORTOGRAM W/LOWER EXTREMITY;  Surgeon: Wellington Hampshire, MD;  Location: [facility];  Service: Cardiovascular;  Laterality: N/A;   ABDOMINAL AORTOGRAM W/LOWER EXTREMITY N/A 09/18/2019   Procedure: ABDOMINAL AORTOGRAM W/LOWER EXTREMITY;  Surgeon: Wellington Hampshire, MD;  Location: [facility];  Service: Cardiovascular;  Laterality: N/A;   AMPUTATION Left 01/23/2020   Procedure: LEFT FIFTH TOE AMPUTATION;  Surgeon: Waynetta Sandy, MD;  Location: [facility];  Service: Vascular;  Laterality: Left;   APPENDECTOMY     CATARACT EXTRACTION Left    CESAREAN SECTION  1990   COLONOSCOPY     FEMORAL-POPLITEAL BYPASS GRAFT Left 11/12/2019   Procedure: BYPASS GRAFT FEMORAL-ABOVE KNEE POPLITEAL ARTERY;  Surgeon: Waynetta Sandy, MD;  Location: [facility];  Service: Vascular;  Laterality: Left;   right tube and ovary removed  WISDOM TOOTH EXTRACTION       Current Outpatient Medications  Medication Sig Dispense Refill   aspirin EC 81 MG tablet Take 81 mg by mouth in the morning. Swallow whole.     atorvastatin (LIPITOR) 40 MG tablet TAKE 1 TABLET BY MOUTH AT BEDTIME 30 tablet 0   cilostazol (PLETAL) 50 MG tablet Take 1 tablet (50 mg total) by mouth 2 (two) times daily. 180 tablet 0   Continuous Blood  Gluc Receiver (FREESTYLE LIBRE 2 READER) DEVI Use to check fasting bs and 2 hrs after largest meal 3 each 0   Continuous Blood Gluc Sensor (FREESTYLE LIBRE 2 SENSOR) MISC USE TO CHECK BLOOD SUGAR AND 2 HOURS AFTER LARGEST MEAL 3 each 0   insulin degludec (TRESIBA FLEXTOUCH) 100 UNIT/ML FlexTouch Pen Inject 50 units subcutaneously at bedtime 3 mL 0   JARDIANCE 10 MG TABS tablet TAKE 1 TABLET BY MOUTH ONCE DAILY BEFORE BREAKFAST 30 tablet 0   metoprolol tartrate (LOPRESSOR) 50 MG tablet Take 1 tablet (50 mg total) by mouth 2 (two) times daily. 180 tablet 3   pantoprazole (PROTONIX) 40 MG tablet Take 1 tablet (40 mg total) by mouth daily. 90 tablet 3   Semaglutide,0.25 or 0.5MG /DOS, (OZEMPIC, 0.25 OR 0.5 MG/DOSE,) 2 MG/1.5ML SOPN INJECT 1/2 (ONE-HALF) MG SUBCUTANEOUSLY  ONCE A WEEK 2 mL 0   No current facility-administered medications for this visit.    Allergies:   Penicillins and Metformin and related    Social History:  The patient  reports that she quit smoking about 20 years ago. Her smoking use included cigarettes. She smoked an average of .5 packs per day. She has never used smokeless tobacco. She reports that she does not drink alcohol and does not use drugs.   Family History:  The patient's family history includes Alzheimer's disease in her maternal grandmother; Fibromyalgia in her sister; Heart disease in her maternal aunt; Lung cancer in her mother; Stroke in her father.    ROS:  Please see the history of present illness.   Otherwise, review of systems are positive for none.   All other systems are reviewed and negative.    PHYSICAL EXAM: VS:  BP 116/80 (BP Location: Right Arm, Patient Position: Sitting, Cuff Size: Large)   Pulse 92   Ht 5\' 4"  (1.626 m)   Wt 241 lb (109.3 kg)   LMP  (LMP Unknown)   BMI 41.37 kg/m  , BMI Body mass index is 41.37 kg/m. GEN: Well nourished, well developed, in no acute distress  HEENT: normal  Neck: no JVD, carotid bruits, or masses Cardiac:  RRR; no murmurs, rubs, or gallops,no edema  Respiratory:  clear to auscultation bilaterally, normal work of breathing GI: soft, nontender, nondistended, + BS MS: no deformity or atrophy  Skin: warm and dry, no rash Neuro:  Strength and sensation are intact Psych: euthymic mood, full affect Vascular: Distal pulses are not palpable.  Femoral pulses are normal bilaterally.   EKG:  EKG is ordered today. EKG showed sinus rhythm with first-degree AV block and possible old inferior infarct with poor R wave progression in the anterior leads.   Recent Labs: 12/14/2020: ALT 15; BUN 10; Creatinine, Ser 0.93; Hemoglobin 13.8; Platelets 304; Potassium 4.2; Sodium 143; TSH 1.080    Lipid Panel    Component Value Date/Time   CHOL 141 12/14/2020 1054   TRIG 61 12/14/2020 1054   HDL 44 12/14/2020 1054   CHOLHDL 3.2 12/14/2020 1054   CHOLHDL 4.0 11/13/2019 0138  VLDL 18 11/13/2019 0138   LDLCALC 84 12/14/2020 1054      Wt Readings from Last 3 Encounters:  01/12/21 241 lb (109.3 kg)  12/14/20 244 lb (110.7 kg)  11/12/20 240 lb 1.6 oz (108.9 kg)        PAD Screen 05/22/2018  Previous PAD dx? No  Previous surgical procedure? No  Pain with walking? Yes  Subsides with rest? No  Feet/toe relief with dangling? No  Painful, non-healing ulcers? No  Extremities discolored? No      ASSESSMENT AND PLAN:  1.  Peripheral arterial disease: Status post left common femoral to above-knee popliteal bypass using vein for gangrenous left small toe.  She now reports severe recurrent bilateral calf claudication which is equal in both sides.  Most recent duplex did show significantly elevated velocity in the proximal segment of the left femoral popliteal bypass. She is expected to have claudication on the right side given known history of occluded right SFA. Given her worsening symptoms, recommend proceeding with abdominal aortogram lower extremity runoff and possible endovascular intervention.  I  discussed the procedure in details as well as risk and benefits.  Planned access is via the right common femoral artery.  2.  Essential hypertension: Continue metoprolol and losartan   3.  Hyperlipidemia: Continue treatment with atorvastatin with a target LDL of less than 70.    Recent lipid profile showed an LDL of 60.  4.  Diabetes mellitus: Most recent hemoglobin A1c improved significantly to 5.8 from 10.  5.  Inappropriate sinus tachycardia versus ectopic atrial tachycardia: Improved with metoprolol and currently she takes 50 mg twice daily.   Disposition:   FU with me in 6 months  Signed,  [name], MD  01/12/2021 10:30 AM    [place]

## 2021-01-12 NOTE — Patient Instructions (Signed)
Medication Instructions:  No changes *If you need a refill on your cardiac medications before your next appointment, please call your pharmacy*  Testing/Procedures: Your physician has requested that you have a peripheral vascular angiogram. This exam is performed at the hospital. During this exam IV contrast is used to look at arterial blood flow. Please review the information sheet given for details.  Follow-Up: At [facility], you and your health needs are our priority.  As part of our continuing mission to provide you with exceptional heart care, we have created designated Provider Care Teams.  These Care Teams include your primary Cardiologist (physician) and Advanced Practice Providers (APPs -  Physician Assistants and Nurse Practitioners) who all work together to provide you with the care you need, when you need it.  We recommend signing up for the patient portal called "MyChart".  Sign up information is provided on this After Visit Summary.  MyChart is used to connect with patients for Virtual Visits (Telemedicine).  Patients are able to view lab/test results, encounter notes, upcoming appointments, etc.  Non-urgent messages can be sent to your provider as well.   To learn more about what you can do with MyChart, go to [email or web address].    Your next appointment:   Keep your follow up with Dr. Fletcher Anon on 02/16/21 at 10:40 am   [place] [name] [place] [address] Dept: [phone] Loc: [facility]  01/12/2021  You are scheduled for a Peripheral Angiogram on Wednesday, November 9 with Dr. [name].  1. Please arrive at the [facility] (Main Entrance A) at [facility]: [address] [place], [place] [address] at 6:30 AM (This time is two hours before your procedure to ensure your preparation). Free valet parking service is available.   Special  note: Every effort is made to have your procedure done on time. Please understand that emergencies sometimes delay scheduled procedures.  2. Diet: Do not eat solid foods after midnight.  The patient may have clear liquids until 5am upon the day of the procedure.  3. Labs: You will need to have blood drawn on 01/12/21  4. Medication instructions in preparation for your procedure: Hold all diabetic medication the morning of the procedure Take half the dose of the Tresiba the night before the procedure  On the morning of your procedure, take your Aspirin and any morning medicines NOT listed above.  You may use sips of water.  5. Plan for one night stay--bring personal belongings. 6. Bring a current list of your medications and current insurance cards. 7. You MUST have a responsible person to drive you home. 8. Someone MUST be with you the first 24 hours after you arrive home or your discharge will be delayed. 9. Please wear clothes that are easy to get on and off and wear slip-on shoes.  Thank you for allowing [name] to care for you!   -- [facility]  11/

## 2021-01-20 ENCOUNTER — Ambulatory Visit (INDEPENDENT_AMBULATORY_CARE_PROVIDER_SITE_OTHER)
Admission: RE | Admit: 2021-01-20 | Discharge: 2021-01-20 | Disposition: A | Discharge status: Home / Self Care | Payer: 59 | Source: Ambulatory Visit | Attending: Vascular Surgery | Admitting: Vascular Surgery

## 2021-01-20 ENCOUNTER — Ambulatory Visit (HOSPITAL_COMMUNITY)
Admission: RE | Admit: 2021-01-20 | Discharge: 2021-01-20 | Disposition: A | Discharge status: Home / Self Care | Payer: 59 | Source: Ambulatory Visit | Attending: Vascular Surgery | Admitting: Vascular Surgery

## 2021-01-20 ENCOUNTER — Ambulatory Visit (INDEPENDENT_AMBULATORY_CARE_PROVIDER_SITE_OTHER): Payer: 59 | Admitting: Physician Assistant

## 2021-01-20 ENCOUNTER — Telehealth: Payer: Self-pay | Admitting: Cardiovascular Disease

## 2021-01-20 ENCOUNTER — Other Ambulatory Visit: Payer: Self-pay

## 2021-01-20 VITALS — BP 155/81 | HR 91 | Temp 97.8°F | Resp 20 | Ht 64.0 in | Wt 242.2 lb

## 2021-01-20 DIAGNOSIS — I739 Peripheral vascular disease, unspecified: Secondary | ICD-10-CM

## 2021-01-20 NOTE — Progress Notes (Signed)
HISTORY AND PHYSICAL     CC:  follow up. Requesting Provider:  [name], PA-C  HPI: This is a 60 y.o. female who is here today for follow up for PAD.  She has extensive surgical history most recently having undergone left 5th toe amputation on 01/23/2020. Prior to that she had a left femoral to above knee popliteal vein bypass on 11/12/19 by Dr. Donzetta Matters. This was performed due to ulceration of her 5th toe. She was doing well at the time of her post op visit in November. She was using Darco shoe for weight bearing. She had no lower extremity claudication or rest pain and no new wounds. She was compliant with her aspirin, statin and Pletal.   Pt was last seen August 2022 and at that time, she was not having any claudication or rest pain.  She did have some cramping at night.  Her amputation site had healed.    The pt returns today for follow up.  She states she is settling in well in [place].  She recently [place] her [place] and had trouble walking as she develops claudication bilaterally.  She recently saw Dr. Fletcher Anon and he is planning on angiogram next week.  She does not have any rest pain or non healing wounds.  Her amputation site is well healed.  She states that she was having some buttock pain and xray revealed arthritis.  She does have some leg swelling in the left leg since the bypass, which is not unexpected.  This improves with elevation.  The pt is on a statin for cholesterol management.    The pt is on an aspirin.    Other AC:  Pletal The pt is on BB for hypertension.  The pt does have diabetes. 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 Posey Pronto at [place]    Wears partial dentures    upper    Past Surgical History:  Procedure Laterality Date   ABDOMINAL AORTOGRAM W/LOWER EXTREMITY N/A 06/06/2018   Procedure: ABDOMINAL AORTOGRAM W/LOWER EXTREMITY;  Surgeon: Wellington Hampshire, MD;  Location: [facility];  Service: Cardiovascular;  Laterality: N/A;   ABDOMINAL AORTOGRAM W/LOWER EXTREMITY N/A 09/18/2019   Procedure: ABDOMINAL AORTOGRAM W/LOWER EXTREMITY;  Surgeon: Wellington Hampshire, MD;  Location: [facility];  Service: Cardiovascular;  Laterality: N/A;   AMPUTATION Left 01/23/2020   Procedure: LEFT FIFTH TOE AMPUTATION;  Surgeon: Waynetta Sandy, MD;  Location: [facility];  Service: Vascular;  Laterality: Left;   APPENDECTOMY     CATARACT EXTRACTION Left    CESAREAN SECTION  1990   COLONOSCOPY     FEMORAL-POPLITEAL BYPASS GRAFT Left 11/12/2019   Procedure: BYPASS GRAFT FEMORAL-ABOVE KNEE POPLITEAL ARTERY;  Surgeon: Waynetta Sandy, MD;  Location: [facility];  Service: Vascular;  Laterality: Left;   right tube and ovary removed     WISDOM TOOTH EXTRACTION      Allergies  Allergen Reactions   Penicillins Hives and Shortness Of Breath  Metformin And Related Diarrhea    Current Outpatient Medications  Medication Sig Dispense Refill   aspirin EC 81 MG tablet Take 81 mg by mouth in the morning. Swallow whole.     atorvastatin (LIPITOR) 40 MG tablet TAKE 1 TABLET BY MOUTH AT BEDTIME 30 tablet 0   cilostazol (PLETAL) 50 MG tablet Take 1 tablet (50 mg total) by mouth 2 (two) times daily. 180 tablet 0   Continuous Blood Gluc Receiver (FREESTYLE LIBRE 2 READER) DEVI Use to check fasting bs and 2 hrs after largest meal 3 each 0   Continuous Blood Gluc Sensor (FREESTYLE LIBRE 2 SENSOR) MISC USE TO CHECK BLOOD SUGAR AND 2 HOURS AFTER LARGEST MEAL 3 each 0   insulin degludec (TRESIBA FLEXTOUCH) 100 UNIT/ML  FlexTouch Pen Inject 50 units subcutaneously at bedtime 3 mL 0   JARDIANCE 10 MG TABS tablet TAKE 1 TABLET BY MOUTH ONCE DAILY BEFORE BREAKFAST 30 tablet 0   metoprolol tartrate (LOPRESSOR) 50 MG tablet Take 1 tablet (50 mg total) by mouth 2 (two) times daily. 180 tablet 3   pantoprazole (PROTONIX) 40 MG tablet Take 1 tablet (40 mg total) by mouth daily. 90 tablet 3   Semaglutide,0.25 or 0.5MG /DOS, (OZEMPIC, 0.25 OR 0.5 MG/DOSE,) 2 MG/1.5ML SOPN INJECT 1/2 (ONE-HALF) MG SUBCUTANEOUSLY  ONCE A WEEK 2 mL 0   No current facility-administered medications for this visit.    Family History  Problem Relation Age of Onset   Lung cancer Mother    Stroke Father    Heart disease Maternal Aunt    Fibromyalgia Sister    Alzheimer's disease Maternal Grandmother    Colon cancer Neg Hx    Esophageal cancer Neg Hx    Rectal cancer Neg Hx    Stomach cancer Neg Hx    Pancreatic cancer Neg Hx     Social History   Socioeconomic History   Marital status: Single    Spouse name: Not on file   Number of children: Not on file   Years of education: Not on file   Highest education level: Not on file  Occupational History   Not on file  Tobacco Use   Smoking status: Former    Packs/day: 0.50    Types: Cigarettes    Quit date: 11/07/2000    Years since quitting: 20.2   Smokeless tobacco: Never   Tobacco comments:    quit 30 plus years ago.  Vaping Use   Vaping Use: Never used  Substance and Sexual Activity   Alcohol use: No    Alcohol/week: 0.0 standard drinks   Drug use: No   Sexual activity: Yes    Birth control/protection: Post-menopausal  Other Topics Concern   Not on file  Social History Narrative   Not on file   Social Determinants of Health   Financial Resource Strain: Not on file  Food Insecurity: Not on file  Transportation Needs: Not on file  Physical Activity: Not on file  Stress: Not on file  Social Connections: Not on file  Intimate Partner Violence: Not on file      REVIEW OF SYSTEMS:   [X]  denotes positive finding, [ ]  denotes negative finding Cardiac  Comments:  Chest pain or chest pressure:    Shortness of breath upon exertion:    Short of breath when lying flat:    Irregular heart rhythm:        Vascular    Pain in calf, thigh, or hip brought on by ambulation: x  Pain in feet at night that wakes you up from your sleep:     Blood clot in your veins:    Leg swelling:         Pulmonary    Oxygen at home:    Productive cough:     Wheezing:         Neurologic    Sudden weakness in arms or legs:     Sudden numbness in arms or legs:     Sudden onset of difficulty speaking or slurred speech:    Temporary loss of vision in one eye:     Problems with dizziness:         Gastrointestinal    Blood in stool:     Vomited blood:         Genitourinary    Burning when urinating:     Blood in urine:        Psychiatric    Major depression:         Hematologic    Bleeding problems:    Problems with blood clotting too easily:        Skin    Rashes or ulcers:        Constitutional    Fever or chills:      PHYSICAL EXAMINATION:  Today's Vitals   01/20/21 1345  BP: (!) 155/81  Pulse: 91  Resp: 20  Temp: 97.8 F (36.6 C)  TempSrc: Temporal  SpO2: 97%  Weight: 242 lb 3.2 oz (109.9 kg)  Height: 5\' 4"  (1.626 m)  PainSc: 8   PainLoc: Leg   Body mass index is 41.57 kg/m.   General:  WDWN in NAD; vital signs documented above Gait: Not observed HENT: WNL, normocephalic Pulmonary: normal non-labored breathing , without wheezing Cardiac: regular HR, without Murmur; without carotid bruits Abdomen: soft, NT, no masses; aortic pulse is not palpable Skin: without rashes Vascular Exam/Pulses:  Right Left  Radial 2+ (normal) 2+ (normal)  Femoral 1+ (weak) 2+ (normal)  Popliteal Unable to palpate Unable to palpate  DP Unable to palpate Unable to palpate  PT Unable to palpate Unable to palpate   Extremities: without ischemic  changes, without Gangrene , without cellulitis; without open wounds; her feet are warm bilaterally Musculoskeletal: no muscle wasting or atrophy  Neurologic: A&O X 3 Psychiatric:  The pt has Normal affect.   Non-Invasive Vascular Imaging:   ABI's/TBI's on 01/20/2021: Right:  0.58/0.14 - Great toe pressure: 24 Left:  0.47/0.32 - Great toe pressure: 57  Arterial duplex on 01/20/2021: Left Graft #1: Fem Pop  +--------------------+--------+-------------+--------+--------+                      PSV cm/sStenosis     WaveformComments  +--------------------+--------+-------------+--------+--------+  Inflow              96                   biphasic          +--------------------+--------+-------------+--------+--------+  Proximal Anastomosis53                   biphasic          +--------------------+--------+-------------+--------+--------+  Proximal Graft      358     >70% stenosisbiphasic          +--------------------+--------+-------------+--------+--------+  Mid Graft           65  biphasic          +--------------------+--------+-------------+--------+--------+  Distal Graft        78                   biphasic          +--------------------+--------+-------------+--------+--------+  Distal Anastomosis  151                  biphasic          +--------------------+--------+-------------+--------+--------+  Outflow             47                   biphasic          +--------------------+--------+-------------+--------+--------+   Previous ABI's/TBI's on 11/12/2020: Right:  0.59/0.20 - Great toe pressure: 39 Left:  1.06/0.43 - Great toe pressure:  83  Previous arterial duplex on 11/12/2020: Left Graft #1: femoral to AK popliteal  +--------------------+--------+---------------+----------+--------+                      PSV cm/sStenosis       Waveform  Comments   +--------------------+--------+---------------+----------+--------+  Inflow              180                    biphasic            +--------------------+--------+---------------+----------+--------+  Proximal Anastomosis83                     monophasic          +--------------------+--------+---------------+----------+--------+  Proximal Graft      296     50-70% stenosisbiphasic            +--------------------+--------+---------------+----------+--------+  Mid Graft           78                     biphasic            +--------------------+--------+---------------+----------+--------+  Distal Graft        39                     biphasic            +--------------------+--------+---------------+----------+--------+  Distal Anastomosis  103                    biphasic            +--------------------+--------+---------------+----------+--------+  Outflow             118                    biphasic            +--------------------+--------+---------------+----------+--------+    ASSESSMENT/PLAN:: 60 y.o. female here for follow up for PAD.  She has extensive surgical history most recently having undergone left 5th toe amputation on 01/23/2020. Prior to that she had a left femoral to above knee popliteal vein bypass on 11/12/19 by Dr. Donzetta Matters.  -pt with new claudication since last visit BLE. Velocities in proximal graft somewhat higher than 3 months ago.  Dr. Fletcher Anon planning arteriogram next week.  Discussed with pt that we will see her back as needed as Dr. Fletcher Anon is monitoring her PAD.   -continued leg swelling since bypass-continue elevation    Leontine Locket, [facility] [phone]  Clinic MD:  Donzetta Matters

## 2021-01-20 NOTE — Telephone Encounter (Signed)
   [place] called:  auth# [id number] for CPT code: [phone] for quantity of 1 unit, 01/26/21 - 02/25/21 Josem Kaufmann is approved, please advised approval is not a guarantee as payments. Copy of auth was faxed today.

## 2021-01-20 NOTE — Telephone Encounter (Signed)
Message routed to RN and pre-cert team as Juluis Rainier

## 2021-01-26 ENCOUNTER — Telehealth: Payer: Self-pay | Admitting: *Deleted

## 2021-01-26 NOTE — Telephone Encounter (Signed)
Abdominal aortogram scheduled at [facility] for: Wednesday January 27, 2021 8:30 AM [facility] Main Entrance A [facility]) at: 6:30 AM   No solid food after midnight prior to cath, clear liquids until 5 AM day of procedure.  Medication instructions: Hold: Jardiance-AM of procedure Treshiba-1/2 usual dose HS prior to procedure  Except hold medications usual morning medications can be taken pre-cath with sips of water including aspirin 81 mg.    Confirmed patient has responsible adult to drive home post procedure and be with patient first 24 hours after arriving home.  [facility] does allow one visitor to accompany you and wait in the hospital waiting room while you are there for your procedure. You and your visitor will be asked to wear a mask once you enter the hospital.   Patient reports does not currently have any new symptoms concerning for COVID-19 and no household members with COVID-19 like illness.   Reviewed procedure/mask/visitor instructions with patient.

## 2021-01-27 ENCOUNTER — Ambulatory Visit (HOSPITAL_COMMUNITY)
Admission: RE | Admit: 2021-01-27 | Discharge: 2021-01-27 | Disposition: A | Discharge status: Home / Self Care | Payer: 59 | Attending: Cardiovascular Disease | Admitting: Cardiovascular Disease

## 2021-01-27 ENCOUNTER — Other Ambulatory Visit: Payer: Self-pay

## 2021-01-27 ENCOUNTER — Encounter (HOSPITAL_COMMUNITY)
Admission: RE | Disposition: A | Discharge status: Home / Self Care | Payer: Self-pay | Source: Home / Self Care | Attending: Cardiovascular Disease

## 2021-01-27 ENCOUNTER — Encounter (HOSPITAL_COMMUNITY): Payer: Self-pay | Admitting: Cardiovascular Disease

## 2021-01-27 DIAGNOSIS — Z87891 Personal history of nicotine dependence: Secondary | ICD-10-CM | POA: Insufficient documentation

## 2021-01-27 DIAGNOSIS — E785 Hyperlipidemia, unspecified: Secondary | ICD-10-CM | POA: Insufficient documentation

## 2021-01-27 DIAGNOSIS — I70213 Atherosclerosis of native arteries of extremities with intermittent claudication, bilateral legs: Secondary | ICD-10-CM | POA: Diagnosis not present

## 2021-01-27 DIAGNOSIS — I739 Peripheral vascular disease, unspecified: Secondary | ICD-10-CM

## 2021-01-27 DIAGNOSIS — E1151 Type 2 diabetes mellitus with diabetic peripheral angiopathy without gangrene: Secondary | ICD-10-CM | POA: Diagnosis present

## 2021-01-27 DIAGNOSIS — I10 Essential (primary) hypertension: Secondary | ICD-10-CM | POA: Diagnosis not present

## 2021-01-27 DIAGNOSIS — E1136 Type 2 diabetes mellitus with diabetic cataract: Secondary | ICD-10-CM | POA: Diagnosis not present

## 2021-01-27 DIAGNOSIS — I70212 Atherosclerosis of native arteries of extremities with intermittent claudication, left leg: Secondary | ICD-10-CM | POA: Diagnosis not present

## 2021-01-27 HISTORY — PX: PERIPHERAL VASCULAR BALLOON ANGIOPLASTY: CATH118281

## 2021-01-27 HISTORY — PX: ABDOMINAL AORTOGRAM W/LOWER EXTREMITY: CATH118223

## 2021-01-27 LAB — GLUCOSE, CAPILLARY: Glucose-Capillary: 102 mg/dL — ABNORMAL HIGH (ref 70–99)

## 2021-01-27 LAB — POCT ACTIVATED CLOTTING TIME: Activated Clotting Time: 254 seconds

## 2021-01-27 SURGERY — ABDOMINAL AORTOGRAM W/LOWER EXTREMITY
Anesthesia: LOCAL

## 2021-01-27 MED ORDER — MIDAZOLAM HCL 2 MG/2ML IJ SOLN
INTRAMUSCULAR | Status: DC | PRN
  Administered 2021-01-27: 1 mg via INTRAVENOUS

## 2021-01-27 MED ORDER — SODIUM CHLORIDE 0.9% FLUSH: 3.0000 mL | Freq: Two times a day (BID) | INTRAVENOUS | Status: DC

## 2021-01-27 MED ORDER — SODIUM CHLORIDE 0.9% FLUSH: 3.0000 mL | INTRAVENOUS | Status: DC | PRN

## 2021-01-27 MED ORDER — LABETALOL HCL 5 MG/ML IV SOLN: 10.0000 mg | INTRAVENOUS | Status: DC | PRN

## 2021-01-27 MED ORDER — NITROGLYCERIN 1 MG/10 ML FOR IR/CATH LAB
INTRA_ARTERIAL | Status: DC | PRN
  Administered 2021-01-27: 300 ug via INTRA_ARTERIAL

## 2021-01-27 MED ORDER — HEPARIN SODIUM (PORCINE) 1000 UNIT/ML IJ SOLN
INTRAMUSCULAR | Status: DC | PRN
  Administered 2021-01-27: 7000 [IU] via INTRAVENOUS

## 2021-01-27 MED ORDER — LIDOCAINE HCL (PF) 1 % IJ SOLN
INTRAMUSCULAR | Status: DC | PRN
  Administered 2021-01-27: 15 mL via INTRADERMAL

## 2021-01-27 MED ORDER — FENTANYL CITRATE (PF) 100 MCG/2ML IJ SOLN
INTRAMUSCULAR | Status: DC | PRN
  Administered 2021-01-27: 50 ug via INTRAVENOUS

## 2021-01-27 MED ORDER — ASPIRIN 81 MG PO CHEW: 81.0000 mg | CHEWABLE_TABLET | ORAL | Status: DC

## 2021-01-27 MED ORDER — SODIUM CHLORIDE 0.9 % IV SOLN: 250.0000 mL | INTRAVENOUS | Status: DC | PRN

## 2021-01-27 MED ORDER — ACETAMINOPHEN 325 MG PO TABS: 650.0000 mg | ORAL_TABLET | ORAL | Status: DC | PRN

## 2021-01-27 MED ORDER — HEPARIN (PORCINE) IN NACL 1000-0.9 UT/500ML-% IV SOLN
INTRAVENOUS | Status: DC | PRN
  Administered 2021-01-27 (×2): 500 mL

## 2021-01-27 MED ORDER — MIDAZOLAM HCL 2 MG/2ML IJ SOLN
INTRAMUSCULAR | Status: AC
  Filled 2021-01-27: qty 2

## 2021-01-27 MED ORDER — HEPARIN SODIUM (PORCINE) 1000 UNIT/ML IJ SOLN
INTRAMUSCULAR | Status: AC
  Filled 2021-01-27: qty 1

## 2021-01-27 MED ORDER — FENTANYL CITRATE (PF) 100 MCG/2ML IJ SOLN
INTRAMUSCULAR | Status: AC
  Filled 2021-01-27: qty 2

## 2021-01-27 MED ORDER — IODIXANOL 320 MG/ML IV SOLN
INTRAVENOUS | Status: DC | PRN
  Administered 2021-01-27: 170 mL via INTRA_ARTERIAL

## 2021-01-27 MED ORDER — CLOPIDOGREL BISULFATE 300 MG PO TABS
ORAL_TABLET | ORAL | Status: AC
  Filled 2021-01-27: qty 1

## 2021-01-27 MED ORDER — LIDOCAINE HCL (PF) 1 % IJ SOLN
INTRAMUSCULAR | Status: AC
  Filled 2021-01-27: qty 30

## 2021-01-27 MED ORDER — SODIUM CHLORIDE 0.9 % IV SOLN: INTRAVENOUS | Status: DC

## 2021-01-27 MED ORDER — ONDANSETRON HCL 4 MG/2ML IJ SOLN: 4.0000 mg | Freq: Four times a day (QID) | INTRAMUSCULAR | Status: DC | PRN

## 2021-01-27 MED ORDER — CLOPIDOGREL BISULFATE 300 MG PO TABS
ORAL_TABLET | ORAL | Status: DC | PRN
  Administered 2021-01-27: 300 mg via ORAL

## 2021-01-27 MED ORDER — HEPARIN (PORCINE) IN NACL 1000-0.9 UT/500ML-% IV SOLN
INTRAVENOUS | Status: AC
  Filled 2021-01-27: qty 1000

## 2021-01-27 MED ORDER — CLOPIDOGREL BISULFATE 75 MG PO TABS: 75.0000 mg | ORAL_TABLET | Freq: Every day | ORAL | 6 refills | Status: DC

## 2021-01-27 SURGICAL SUPPLY — 21 items
CATH ANGIO 5F PIGTAIL 65CM (CATHETERS) ×3 IMPLANT
CATH CROSS OVER TEMPO 5F (CATHETERS) ×3 IMPLANT
CATH STRAIGHT 5FR 65CM (CATHETERS) ×3 IMPLANT
DCB RANGER 5.0X60 135 (BALLOONS) ×2 IMPLANT
DCB RANGER 6.0X40 135 (BALLOONS) ×2 IMPLANT
DEVICE CLOSURE MYNXGRIP 6/7F (Vascular Products) ×3 IMPLANT
KIT ENCORE 26 ADVANTAGE (KITS) ×3 IMPLANT
KIT MICROPUNCTURE NIT STIFF (SHEATH) ×3 IMPLANT
KIT PV (KITS) ×3 IMPLANT
RANGER DCB 5.0X60 135 (BALLOONS) ×3
RANGER DCB 6.0X40 135 (BALLOONS) ×3
SHEATH PINNACLE 5F 10CM (SHEATH) ×3 IMPLANT
SHEATH PINNACLE 6F 10CM (SHEATH) ×3 IMPLANT
SHEATH PINNACLE MP 6F 45CM (SHEATH) ×3 IMPLANT
STOPCOCK MORSE 400PSI 3WAY (MISCELLANEOUS) ×3 IMPLANT
SYR MEDRAD MARK 7 150ML (SYRINGE) ×3 IMPLANT
TRANSDUCER W/STOPCOCK (MISCELLANEOUS) ×3 IMPLANT
TRAY PV CATH (CUSTOM PROCEDURE TRAY) ×3 IMPLANT
TUBING CIL FLEX 10 FLL-RA (TUBING) ×3 IMPLANT
WIRE G V18X300CM (WIRE) ×3 IMPLANT
WIRE HITORQ VERSACORE ST 145CM (WIRE) ×3 IMPLANT

## 2021-01-27 NOTE — Discharge Instructions (Signed)
Femoral Site Care This sheet gives you information about how to care for yourself after your procedure. Your health care provider may also give you more specific instructions. If you have problems or questions, contact your health care provider. What can I expect after the procedure? After the procedure, it is common to have: Bruising that usually fades within 1-2 weeks. Tenderness at the site. Follow these instructions at home: Wound care May remove bandage after 24 hours. Do not take baths, swim, or use a hot tub for 5 days. You may shower 24-48 hours after the procedure. Gently wash the site with plain soap and water. Pat the area dry with a clean towel. Do not rub the site. This may cause bleeding. Do not apply powder or lotion to the site. Keep the site clean and dry. Check your femoral site every day for signs of infection. Check for: Redness, swelling, or pain. Fluid or blood. Warmth. Pus or a bad smell. Activity For the first 2-3 days after your procedure, or as long as directed: Avoid climbing stairs as much as possible. Do not squat. Do not lift, push or pull anything that is heavier than 10 lb for 5 days. Rest as directed. Avoid sitting for a long time without moving. Get up to take short walks every 1-2 hours. Do not drive for 24 hours. General instructions Take over-the-counter and prescription medicines only as told by your health care provider. Keep all follow-up visits as told by your health care provider. This is important. DRINK PLENTY OF FLUIDS FOR THE NEXT 2-3 DAYS. Contact a health care provider if you have: A fever or chills. You have redness, swelling, or pain around your insertion site. Get help right away if: The catheter insertion area swells very fast. You pass out. You suddenly start to sweat or your skin gets clammy. The catheter insertion area is bleeding, and the bleeding does not stop when you hold steady pressure on the area. The area near or  just beyond the catheter insertion site becomes pale, cool, tingly, or numb. These symptoms may represent a serious problem that is an emergency. Do not wait to see if the symptoms will go away. Get medical help right away. Call your local emergency services (911 in the [place] Do not drive yourself to the hospital. Summary After the procedure, it is common to have bruising that usually fades within 1-2 weeks. Check your femoral site every day for signs of infection. Do not lift, push or pull anything that is heavier than 10 lb for 5 days.  This information is not intended to replace advice given to you by your health care provider. Make sure you discuss any questions you have with your health care provider. Document Revised: 03/20/2017 Document Reviewed: 03/20/2017 Elsevier Patient Education  2020 Elsevier Inc.  

## 2021-01-27 NOTE — Progress Notes (Signed)
Pt ambulated to the bathroom without bleeding or difficulty. IV therapy in progress

## 2021-01-27 NOTE — Interval H&P Note (Signed)
History and Physical Interval Note:  01/27/2021 8:35 AM  Julie Prince  has presented today for surgery, with the diagnosis of pad w/ claudication.  The various methods of treatment have been discussed with the patient and family. After consideration of risks, benefits and other options for treatment, the patient has consented to  Procedure(s): ABDOMINAL AORTOGRAM W/LOWER EXTREMITY (N/A) as a surgical intervention.  The patient's history has been reviewed, patient examined, no change in status, stable for surgery.  I have reviewed the patient's chart and labs.  Questions were answered to the patient's satisfaction.     [name]

## 2021-01-28 ENCOUNTER — Other Ambulatory Visit: Payer: Self-pay | Admitting: *Deleted

## 2021-01-28 DIAGNOSIS — I739 Peripheral vascular disease, unspecified: Secondary | ICD-10-CM

## 2021-02-06 ENCOUNTER — Other Ambulatory Visit: Payer: Self-pay | Admitting: Physician Assistant

## 2021-02-06 DIAGNOSIS — E1165 Type 2 diabetes mellitus with hyperglycemia: Secondary | ICD-10-CM

## 2021-02-10 ENCOUNTER — Ambulatory Visit (HOSPITAL_COMMUNITY)
Admission: RE | Admit: 2021-02-10 | Discharge: 2021-02-10 | Disposition: A | Discharge status: Home / Self Care | Payer: 59 | Source: Ambulatory Visit | Attending: Cardiovascular Disease | Admitting: Cardiovascular Disease

## 2021-02-10 ENCOUNTER — Other Ambulatory Visit: Payer: Self-pay

## 2021-02-10 ENCOUNTER — Other Ambulatory Visit: Payer: Self-pay | Admitting: Physician Assistant

## 2021-02-10 DIAGNOSIS — E1165 Type 2 diabetes mellitus with hyperglycemia: Secondary | ICD-10-CM

## 2021-02-10 DIAGNOSIS — I739 Peripheral vascular disease, unspecified: Secondary | ICD-10-CM | POA: Diagnosis not present

## 2021-02-15 ENCOUNTER — Other Ambulatory Visit: Payer: Self-pay | Admitting: *Deleted

## 2021-02-15 DIAGNOSIS — I739 Peripheral vascular disease, unspecified: Secondary | ICD-10-CM

## 2021-02-16 ENCOUNTER — Other Ambulatory Visit: Payer: Self-pay

## 2021-02-16 ENCOUNTER — Ambulatory Visit (INDEPENDENT_AMBULATORY_CARE_PROVIDER_SITE_OTHER): Payer: 59 | Admitting: Cardiovascular Disease

## 2021-02-16 ENCOUNTER — Encounter: Payer: Self-pay | Admitting: Cardiovascular Disease

## 2021-02-16 VITALS — BP 112/74 | HR 86 | Ht 64.0 in | Wt 243.0 lb

## 2021-02-16 DIAGNOSIS — I10 Essential (primary) hypertension: Secondary | ICD-10-CM | POA: Diagnosis not present

## 2021-02-16 DIAGNOSIS — R0609 Other forms of dyspnea: Secondary | ICD-10-CM

## 2021-02-16 DIAGNOSIS — E785 Hyperlipidemia, unspecified: Secondary | ICD-10-CM | POA: Diagnosis not present

## 2021-02-16 DIAGNOSIS — I739 Peripheral vascular disease, unspecified: Secondary | ICD-10-CM | POA: Diagnosis not present

## 2021-02-16 NOTE — Patient Instructions (Signed)
Medication Instructions:  Your physician recommends that you continue on your current medications as directed. Please refer to the Current Medication list given to you today.  *If you need a refill on your cardiac medications before your next appointment, please call your pharmacy*  Testing/Procedures: Your physician has requested that you have a lexiscan myoview. For further information please visit [email or web address]. Please follow instruction sheet, as given. This will take place at [address], [address]  How to prepare for your Myocardial Perfusion Test: Do not eat or drink 3 hours prior to your test, except you may have water. Do not consume products containing caffeine (regular or decaffeinated) 12 hours prior to your test. (ex: coffee, chocolate, sodas, tea). Do bring a list of your current medications with you.  If not listed below, you may take your medications as normal. Do wear comfortable clothes (no dresses or overalls) and walking shoes, tennis shoes preferred (No heels or open toe shoes are allowed). Do NOT wear cologne, perfume, aftershave, or lotions (deodorant is allowed). The test will take approximately 3 to 4 hours to complete If these instructions are not followed, your test will have to be rescheduled.   Follow-Up: At [facility], you and your health needs are our priority.  As part of our continuing mission to provide you with exceptional heart care, we have created designated Provider Care Teams.  These Care Teams include your primary Cardiologist (physician) and Advanced Practice Providers (APPs -  Physician Assistants and Nurse Practitioners) who all work together to provide you with the care you need, when you need it.  We recommend signing up for the patient portal called "MyChart".  Sign up information is provided on this After Visit Summary.  MyChart is used to connect with patients for Virtual Visits (Telemedicine).  Patients are able to view lab/test  results, encounter notes, upcoming appointments, etc.  Non-urgent messages can be sent to your provider as well.   To learn more about what you can do with MyChart, go to [email or web address].    Your next appointment:   6 month(s)  The format for your next appointment:   In Person  Provider:   Dr. Fletcher Anon

## 2021-02-16 NOTE — Progress Notes (Signed)
Cardiology Office Note   Date:  02/16/2021   ID:  Julie Prince, DOB 05/26/1960, MRN [id number]  PCP:  Jeral Fruit, MD  Cardiologist: Dr. Oval Linsey  Chief Complaint  Patient presents with   Follow-up      History of Present Illness: Julie Prince is a 60 y.o. female who is here today for follow-up visit regarding peripheral arterial disease.   She has history of inappropriate sinus tachycardia, diabetes and hyperlipidemia. She is a previous smoker and quit more than 20 years ago. She has known history of peripheral arterial disease. Angiography in March of 2020 showed no significant aortoiliac disease, on the left, there was flush occlusion of the SFA with reconstitution via extensive collaterals from the profunda with two-vessel runoff below the knee.  On the right side, there was diffuse disease in the proximal portion of the SFA with total occlusion in the midsegment with reconstitution distally via extensive collaterals and one-vessel runoff below the knee.  She was started on small dose cilostazol.  At that time, she had no severe claudication or lower extremity ulceration and thus medical therapy was recommended.  However, she developed gangrenous left small toe and that repeat angiography was performed in June 2021which showed similar findings.  She underwent left common femoral to above-knee popliteal artery bypass with vein by Dr. Donzetta Matters in August.   She had resolution of claudication on the left side at that time and ulceration healed.  She was seen recently for recurrent left calf claudication with evidence of high velocities in the left femoral-popliteal bypass. Angiography was done early this month which showed patent left femoral-popliteal bypass with significant stenosis proximally and distally with two-vessel runoff below the knee.  On the right side, there was severe ostial SFA disease followed by a long occlusion from the mid to the distal segment with  reconstitution via collaterals from the profunda and the popliteal artery with one-vessel runoff below the knee via the posterior tibial artery.  I performed successful drug-coated balloon angioplasty to the proximal as well as distal portion of the left femoral-popliteal bypass.  She reports resolution of claudication on the left side.  She has stable right calf claudication but does not feel that symptoms are lifestyle limiting.  Postprocedure ABI improved to normal on the left side. She denies chest pain but reports worsening exertional dyspnea of unclear etiology.   Past Medical History:  Diagnosis Date   Allergy    Barrett esophagus    Cataract    left cataract removal   Diverticulosis    Essential hypertension, benign 09/12/2013   GERD (gastroesophageal reflux disease)    Hiatal hernia    Hyperlipidemia 09/12/2013   Inappropriate sinus tachycardia 04/12/2018   Peripheral vascular disease ([name])    PONV (postoperative nausea and vomiting)    Type 2 diabetes mellitus ([place]) 09/12/2013   Dr. Posey Pronto at [place]    Wears partial dentures    upper    Past Surgical History:  Procedure Laterality Date   ABDOMINAL AORTOGRAM W/LOWER EXTREMITY N/A 06/06/2018   Procedure: ABDOMINAL AORTOGRAM W/LOWER EXTREMITY;  Surgeon: Wellington Hampshire, MD;  Location: [facility];  Service: Cardiovascular;  Laterality: N/A;   ABDOMINAL AORTOGRAM W/LOWER EXTREMITY N/A 09/18/2019   Procedure: ABDOMINAL AORTOGRAM W/LOWER EXTREMITY;  Surgeon: Wellington Hampshire, MD;  Location: [facility];  Service: Cardiovascular;  Laterality: N/A;   ABDOMINAL AORTOGRAM W/LOWER EXTREMITY N/A 01/27/2021   Procedure: ABDOMINAL AORTOGRAM W/LOWER EXTREMITY;  Surgeon: Wellington Hampshire, MD;  Location: [facility];  Service: Cardiovascular;  Laterality: N/A;   AMPUTATION Left 01/23/2020   Procedure: LEFT FIFTH TOE AMPUTATION;  Surgeon: Waynetta Sandy, MD;  Location: [facility];  Service: Vascular;   Laterality: Left;   APPENDECTOMY     CATARACT EXTRACTION Left    CESAREAN SECTION  1990   COLONOSCOPY     FEMORAL-POPLITEAL BYPASS GRAFT Left 11/12/2019   Procedure: BYPASS GRAFT FEMORAL-ABOVE KNEE POPLITEAL ARTERY;  Surgeon: Waynetta Sandy, MD;  Location: [facility];  Service: Vascular;  Laterality: Left;   PERIPHERAL VASCULAR BALLOON ANGIOPLASTY Left 01/27/2021   Procedure: PERIPHERAL VASCULAR BALLOON ANGIOPLASTY;  Surgeon: Wellington Hampshire, MD;  Location: [facility];  Service: Cardiovascular;  Laterality: Left;  SFA graft   right tube and ovary removed     WISDOM TOOTH EXTRACTION       Current Outpatient Medications  Medication Sig Dispense Refill   aspirin EC 81 MG tablet Take 81 mg by mouth in the morning. Swallow whole.     atorvastatin (LIPITOR) 40 MG tablet TAKE 1 TABLET BY MOUTH AT BEDTIME 30 tablet 0   BIOTIN PO Take 1 tablet by mouth daily.     clopidogrel (PLAVIX) 75 MG tablet Take 1 tablet (75 mg total) by mouth daily. 30 tablet 6   COLLAGEN PO Take 1 tablet by mouth daily.     Continuous Blood Gluc Receiver (FREESTYLE LIBRE 2 READER) DEVI Use to check fasting bs and 2 hrs after largest meal 3 each 0   Continuous Blood Gluc Sensor (FREESTYLE LIBRE 2 SENSOR) MISC USE TO CHECK BLOOD SUGAR AND 2 HOURS AFTER LARGEST MEAL 3 each 0   insulin degludec (TRESIBA FLEXTOUCH) 100 UNIT/ML FlexTouch Pen Inject 50 units subcutaneously at bedtime 3 mL 0   JARDIANCE 10 MG TABS tablet TAKE 1 TABLET BY MOUTH ONCE DAILY BEFORE BREAKFAST 30 tablet 0   metoprolol tartrate (LOPRESSOR) 50 MG tablet Take 1 tablet (50 mg total) by mouth 2 (two) times daily. 180 tablet 3   Multiple Vitamin (MULTIVITAMIN WITH MINERALS) TABS tablet Take 1 tablet by mouth daily.     pantoprazole (PROTONIX) 40 MG tablet Take 1 tablet (40 mg total) by mouth daily. 90 tablet 3   Semaglutide,0.25 or 0.5MG /DOS, (OZEMPIC, 0.25 OR 0.5 MG/DOSE,) 2 MG/1.5ML SOPN INJECT 1/2 (ONE-HALF) MG SUBCUTANEOUSLY  ONCE A WEEK 2  mL 0   VITAMIN D PO Take 1 tablet by mouth daily.     No current facility-administered medications for this visit.    Allergies:   Penicillins and Metformin and related    Social History:  The patient  reports that she quit smoking about 20 years ago. Her smoking use included cigarettes. She smoked an average of .5 packs per day. She has never used smokeless tobacco. She reports that she does not drink alcohol and does not use drugs.   Family History:  The patient's family history includes Alzheimer's disease in her maternal grandmother; Fibromyalgia in her sister; Heart disease in her maternal aunt; Lung cancer in her mother; Stroke in her father.    ROS:  Please see the history of present illness.   Otherwise, review of systems are positive for none.   All other systems are reviewed and negative.    PHYSICAL EXAM: VS:  BP 112/74 (BP Location: Left Arm, Patient Position: Sitting, Cuff Size: Normal)   Pulse 86   Ht 5\' 4"  (1.626 m)   Wt 243  lb (110.2 kg)   LMP  (LMP Unknown)   BMI 41.71 kg/m  , BMI Body mass index is 41.71 kg/m. GEN: Well nourished, well developed, in no acute distress  HEENT: normal  Neck: no JVD, carotid bruits, or masses Cardiac: RRR; no murmurs, rubs, or gallops,no edema  Respiratory:  clear to auscultation bilaterally, normal work of breathing GI: soft, nontender, nondistended, + BS MS: no deformity or atrophy  Skin: warm and dry, no rash Neuro:  Strength and sensation are intact Psych: euthymic mood, full affect Vascular: Right femoral pulses palpable with no hematoma.  Distal pulses are palpable on the left side only.   EKG:  EKG is not ordered today.   Recent Labs: 12/14/2020: ALT 15; TSH 1.080 01/12/2021: BUN 13; Creatinine, Ser 0.97; Hemoglobin 14.0; Platelets 252; Potassium 4.3; Sodium 141    Lipid Panel    Component Value Date/Time   CHOL 141 12/14/2020 1054   TRIG 61 12/14/2020 1054   HDL 44 12/14/2020 1054   CHOLHDL 3.2 12/14/2020 1054    CHOLHDL 4.0 11/13/2019 0138   VLDL 18 11/13/2019 0138   LDLCALC 84 12/14/2020 1054      Wt Readings from Last 3 Encounters:  02/16/21 243 lb (110.2 kg)  01/27/21 241 lb (109.3 kg)  01/20/21 242 lb 3.2 oz (109.9 kg)        PAD Screen 05/22/2018  Previous PAD dx? No  Previous surgical procedure? No  Pain with walking? Yes  Subsides with rest? No  Feet/toe relief with dangling? No  Painful, non-healing ulcers? No  Extremities discolored? No      ASSESSMENT AND PLAN:  1.  Peripheral arterial disease: Status post left common femoral to above-knee popliteal bypass using vein for gangrenous left small toe.  Status post recent drug-coated balloon angioplasty to the left femoral popliteal bypass at the proximal and distal segments.  ABI improved to normal with relatively normal velocities.  Continue dual antiplatelet therapy long-term.  Repeat Doppler studies in 6 months.  She does have occluded right SFA but has excellent collaterals.  Claudication on the right side currently is not lifestyle limiting and thus I recommend continuing medical therapy.  2.  Essential hypertension: Continue metoprolol and losartan   3.  Hyperlipidemia: Continue treatment with atorvastatin with a target LDL of less than 70.    Recent lipid profile showed an LDL of 60.  4.  Diabetes mellitus: Most recent hemoglobin A1c improved significantly to 5.8 from 10.  5.  Inappropriate sinus tachycardia versus ectopic atrial tachycardia: Improved with metoprolol and currently she takes 50 mg twice daily.  6.  Worsening exertional dyspnea: Given her known history of diabetes and established history of peripheral arterial disease, this could be anginal equivalent.  I requested a Lexiscan Myoview.   Disposition:   FU with me in 6 months  Signed,  [name], MD  02/16/2021 10:19 AM    [place]

## 2021-03-02 ENCOUNTER — Telehealth (HOSPITAL_COMMUNITY): Payer: Self-pay | Admitting: *Deleted

## 2021-03-02 NOTE — Telephone Encounter (Signed)
Close encounter 

## 2021-03-03 ENCOUNTER — Telehealth (HOSPITAL_COMMUNITY): Payer: Self-pay | Admitting: *Deleted

## 2021-03-03 NOTE — Telephone Encounter (Signed)
Close encounter 

## 2021-03-04 ENCOUNTER — Ambulatory Visit (HOSPITAL_COMMUNITY)
Admission: RE | Admit: 2021-03-04 | Discharge: 2021-03-04 | Disposition: A | Discharge status: Home / Self Care | Payer: 59 | Source: Ambulatory Visit | Attending: Cardiology | Admitting: Cardiology

## 2021-03-04 ENCOUNTER — Other Ambulatory Visit: Payer: Self-pay

## 2021-03-04 DIAGNOSIS — R0609 Other forms of dyspnea: Secondary | ICD-10-CM | POA: Diagnosis not present

## 2021-03-04 MED ORDER — REGADENOSON 0.4 MG/5ML IV SOLN
0.4000 mg | Freq: Once | INTRAVENOUS | Status: AC
  Administered 2021-03-04: 0.4 mg via INTRAVENOUS

## 2021-03-04 MED ORDER — TECHNETIUM TC 99M TETROFOSMIN IV KIT
30.1000 | PACK | Freq: Once | INTRAVENOUS | Status: AC | PRN
  Administered 2021-03-04: 30.1 via INTRAVENOUS
  Filled 2021-03-04: qty 31

## 2021-03-05 ENCOUNTER — Ambulatory Visit (HOSPITAL_COMMUNITY): Payer: 59

## 2021-03-05 ENCOUNTER — Ambulatory Visit (HOSPITAL_COMMUNITY)
Admission: RE | Admit: 2021-03-05 | Discharge: 2021-03-05 | Disposition: A | Discharge status: Home / Self Care | Payer: 59 | Source: Ambulatory Visit | Attending: Cardiovascular Disease | Admitting: Cardiovascular Disease

## 2021-03-05 LAB — MYOCARDIAL PERFUSION IMAGING
LV dias vol: 79 mL (ref 46–106)
LV sys vol: 24 mL
Nuc Stress EF: 69 %
Peak HR: 83 {beats}/min
Rest HR: 77 {beats}/min
Rest Nuclear Isotope Dose: 32 mCi
SDS: 0
SRS: 4
SSS: 4
ST Depression (mm): 0 mm
Stress Nuclear Isotope Dose: 30.1 mCi
TID: 0.89

## 2021-03-05 MED ORDER — TECHNETIUM TC 99M TETROFOSMIN IV KIT
32.0000 | PACK | Freq: Once | INTRAVENOUS | Status: AC | PRN
  Administered 2021-03-05: 32 via INTRAVENOUS

## 2021-03-08 ENCOUNTER — Other Ambulatory Visit: Payer: Self-pay | Admitting: Cardiovascular Disease

## 2021-03-08 ENCOUNTER — Other Ambulatory Visit: Payer: Self-pay | Admitting: Physician Assistant

## 2021-03-08 DIAGNOSIS — E118 Type 2 diabetes mellitus with unspecified complications: Secondary | ICD-10-CM

## 2021-03-09 NOTE — Telephone Encounter (Signed)
This is a [facility] patient.  °

## 2021-04-13 ENCOUNTER — Other Ambulatory Visit: Payer: Self-pay | Admitting: Internal Medicine

## 2021-04-14 ENCOUNTER — Ambulatory Visit: Payer: 59 | Admitting: Internal Medicine

## 2021-04-16 ENCOUNTER — Ambulatory Visit: Payer: 59 | Admitting: Physician Assistant

## 2021-04-20 NOTE — Progress Notes (Signed)
Error

## 2021-04-22 ENCOUNTER — Ambulatory Visit: Payer: Self-pay | Admitting: Physician Assistant

## 2021-04-26 ENCOUNTER — Other Ambulatory Visit: Payer: Self-pay

## 2021-04-26 DIAGNOSIS — E1165 Type 2 diabetes mellitus with hyperglycemia: Secondary | ICD-10-CM

## 2021-04-26 MED ORDER — EMPAGLIFLOZIN 10 MG PO TABS: 10.0000 mg | ORAL_TABLET | Freq: Every day | ORAL | 0 refills | Status: DC

## 2021-04-27 ENCOUNTER — Telehealth: Payer: Self-pay | Admitting: Physician Assistant

## 2021-04-27 NOTE — Telephone Encounter (Signed)
PA submitted to insurance. AS, CMA

## 2021-04-27 NOTE — Telephone Encounter (Signed)
Patient called per your and her conversation yesterday and stated if a prior Josem Kaufmann is done on the [place] her insurance will cover it and that number is [phone], also patient said her insurance mentioned Basaglar as an option as well. Please advise. [phone]

## 2021-05-06 ENCOUNTER — Other Ambulatory Visit: Payer: Self-pay

## 2021-05-06 ENCOUNTER — Telehealth: Payer: Self-pay | Admitting: Physician Assistant

## 2021-05-06 DIAGNOSIS — E118 Type 2 diabetes mellitus with unspecified complications: Secondary | ICD-10-CM

## 2021-05-06 DIAGNOSIS — E1165 Type 2 diabetes mellitus with hyperglycemia: Secondary | ICD-10-CM

## 2021-05-06 MED ORDER — DAPAGLIFLOZIN PROPANEDIOL 5 MG PO TABS: 5.0000 mg | ORAL_TABLET | Freq: Every day | ORAL | 0 refills | Status: DC

## 2021-05-06 MED ORDER — TRESIBA FLEXTOUCH 100 UNIT/ML ~~LOC~~ SOPN: PEN_INJECTOR | SUBCUTANEOUS | 1 refills | Status: DC

## 2021-05-06 NOTE — Telephone Encounter (Signed)
Patient called stated the pharmacy does not have the Jardiance and she is out and she is also out of her night time insulin and needs that as well. Please advise. [phone]

## 2021-05-06 NOTE — Telephone Encounter (Signed)
Patient contacted and notified of medication refill of the insulin and change in therapy for the Jardiance. Insurance denied the PA for her Vania Rea and preferred [place]. Called in [place] and patient gave verbal understanding.

## 2021-06-01 ENCOUNTER — Ambulatory Visit: Payer: 59 | Admitting: Internal Medicine

## 2021-06-07 ENCOUNTER — Other Ambulatory Visit: Payer: Self-pay

## 2021-06-07 ENCOUNTER — Encounter: Payer: Self-pay | Admitting: Physician Assistant

## 2021-06-07 ENCOUNTER — Ambulatory Visit: Payer: 59 | Admitting: Podiatry

## 2021-06-07 DIAGNOSIS — B351 Tinea unguium: Secondary | ICD-10-CM

## 2021-06-07 DIAGNOSIS — M79675 Pain in left toe(s): Secondary | ICD-10-CM | POA: Diagnosis not present

## 2021-06-07 DIAGNOSIS — M79674 Pain in right toe(s): Secondary | ICD-10-CM | POA: Diagnosis not present

## 2021-06-07 DIAGNOSIS — E0843 Diabetes mellitus due to underlying condition with diabetic autonomic (poly)neuropathy: Secondary | ICD-10-CM

## 2021-06-07 NOTE — Progress Notes (Signed)
? ?  SUBJECTIVE ?Patient with a history of diabetes mellitus presents to office today complaining of elongated, thickened nails that cause pain while ambulating in shoes.  Patient is unable to trim their own nails. Patient is here for further evaluation and treatment. ? ? ?Past Medical History:  ?Diagnosis Date  ? Allergy   ? Barrett esophagus   ? Cataract   ? left cataract removal  ? Diverticulosis   ? Essential hypertension, benign 09/12/2013  ? GERD (gastroesophageal reflux disease)   ? Hiatal hernia   ? Hyperlipidemia 09/12/2013  ? Inappropriate sinus tachycardia 04/12/2018  ? Peripheral vascular disease ([place])   ? PONV (postoperative nausea and vomiting)   ? Type 2 diabetes mellitus ([place]) 09/12/2013  ? Dr. Posey Pronto at [facility]   ? Wears partial dentures   ? upper  ? ? ?OBJECTIVE ?General Patient is awake, alert, and oriented x 3 and in no acute distress. ?Derm Skin is dry and supple bilateral. Negative open lesions or macerations. Remaining integument unremarkable. Nails are tender, long, thickened and dystrophic with subungual debris, consistent with onychomycosis, 1-5 bilateral. No signs of infection noted. ?Vasc  DP and PT pedal pulses palpable bilaterally. Temperature gradient within normal limits.  ?Neuro Epicritic and protective threshold sensation diminished bilaterally.  ?Musculoskeletal Exam No symptomatic pedal deformities noted bilateral. Muscular strength within normal limits. ? ?ASSESSMENT ?1. Diabetes Mellitus w/ peripheral neuropathy ?2.  Pain due to onychomycosis of toenails bilateral ? ?PLAN OF CARE ?1. Patient evaluated today. ?2. Instructed to maintain good pedal hygiene and foot care. Stressed importance of controlling blood sugar.  ?3. Mechanical debridement of nails 1-5 bilaterally performed using a nail nipper. Filed with dremel without incident.  ?4. Return to clinic in 3 mos.  ? ? ? ?Edrick Kins, DPM ?[facility] ? ?Dr. Edrick Kins, DPM  ?  ?[address]                                       ?[place], [place] [address]                ?Office [phone]  ?Fax [phone] ? ? ? ? ? ?

## 2021-07-05 ENCOUNTER — Ambulatory Visit: Payer: 59 | Admitting: Physician Assistant

## 2021-07-16 ENCOUNTER — Telehealth: Payer: Self-pay | Admitting: Internal Medicine

## 2021-07-16 NOTE — Telephone Encounter (Signed)
Patient called, states she needs [name] to authorize protonix medication. Per patient, has 2 refills left but is unable to get it refilled by pharmacy. Please advise. ?

## 2021-07-16 NOTE — Telephone Encounter (Signed)
Prior authorization has been requested. Awaiting insurance response. ? ?[place]: Morton Case ID: [id number] ?Need help? Call us at [phone] ?Status ?Sent to [employer] ?Drug ?Pantoprazole Sodium '40MG'$  dr tablets ?Form ?[employer] Electronic PA Form [phone] NCPDP) ?

## 2021-08-17 ENCOUNTER — Ambulatory Visit: Payer: 59 | Admitting: Cardiovascular Disease

## 2021-08-17 ENCOUNTER — Encounter: Payer: Self-pay | Admitting: Cardiovascular Disease

## 2021-08-17 VITALS — BP 130/77 | HR 91 | Ht 64.0 in | Wt 254.6 lb

## 2021-08-17 DIAGNOSIS — E785 Hyperlipidemia, unspecified: Secondary | ICD-10-CM | POA: Diagnosis not present

## 2021-08-17 DIAGNOSIS — I10 Essential (primary) hypertension: Secondary | ICD-10-CM

## 2021-08-17 DIAGNOSIS — I739 Peripheral vascular disease, unspecified: Secondary | ICD-10-CM

## 2021-08-17 DIAGNOSIS — R0609 Other forms of dyspnea: Secondary | ICD-10-CM | POA: Diagnosis not present

## 2021-08-17 MED ORDER — ATORVASTATIN CALCIUM 40 MG PO TABS: 40.0000 mg | ORAL_TABLET | Freq: Every day | ORAL | 3 refills | Status: DC

## 2021-08-17 MED ORDER — METOPROLOL TARTRATE 50 MG PO TABS: 50.0000 mg | ORAL_TABLET | Freq: Two times a day (BID) | ORAL | 3 refills | Status: DC

## 2021-08-17 MED ORDER — CLOPIDOGREL BISULFATE 75 MG PO TABS: 75.0000 mg | ORAL_TABLET | Freq: Every day | ORAL | 3 refills | Status: DC

## 2021-08-17 NOTE — Progress Notes (Signed)
Cardiology Office Note   Date:  08/17/2021   ID:  Rosine Abe Balzarini, DOB 03-31-1960, MRN [id number]  PCP:  No primary care provider on file.  Cardiologist: Dr. Oval Linsey  No chief complaint on file.     History of Present Illness: Julie Prince is a 61 y.o. female who is here today for follow-up visit regarding peripheral arterial disease.   She has history of inappropriate sinus tachycardia, diabetes and hyperlipidemia. She is a previous smoker and quit more than 20 years ago. She has known history of peripheral arterial disease. Angiography in March of 2020 showed no significant aortoiliac disease, on the left, there was flush occlusion of the SFA with reconstitution via extensive collaterals from the profunda with two-vessel runoff below the knee.  On the right side, there was diffuse disease in the proximal portion of the SFA with total occlusion in the midsegment with reconstitution distally via extensive collaterals and one-vessel runoff below the knee.  She was started on small dose cilostazol.  At that time, she had no severe claudication or lower extremity ulceration and thus medical therapy was recommended.  However, she developed gangrenous left small toe and thus repeat angiography was performed in June 2021which showed similar findings.  She underwent left common femoral to above-knee popliteal artery bypass with vein by Dr. Donzetta Matters in August, 2022.   She had resolution of claudication on the left side at that time and ulceration healed.  She was seen recently for recurrent left calf claudication with evidence of high velocities in the left femoral-popliteal bypass. Angiography was done in November of 2022 showed patent left femoral-popliteal bypass with significant stenosis proximally and distally with two-vessel runoff below the knee.  On the right side, there was severe ostial SFA disease followed by a long occlusion from the mid to the distal segment with reconstitution via  collaterals from the profunda and the popliteal artery with one-vessel runoff below the knee via the posterior tibial artery.  I performed successful drug-coated balloon angioplasty to the proximal as well as distal portion of the left femoral-popliteal bypass.    Due to exertional dyspnea, she underwent a Lexiscan Myoview In December 2022 which showed no evidence of ischemia with normal ejection fraction.  She has been doing reasonably well overall with occasional atypical chest pain mostly at rest not with exertion.  She is bothered by right calf claudication but that seems to be stable.  Unfortunately, she continues to gain weight.  Past Medical History:  Diagnosis Date   Allergy    Barrett esophagus    Cataract    left cataract removal   Diverticulosis    Essential hypertension, benign 09/12/2013   GERD (gastroesophageal reflux disease)    Hiatal hernia    Hyperlipidemia 09/12/2013   Inappropriate sinus tachycardia 04/12/2018   Peripheral vascular disease ([name])    PONV (postoperative nausea and vomiting)    Type 2 diabetes mellitus ([place]) 09/12/2013   Dr. Posey Pronto at [facility]    Wears partial dentures    upper    Past Surgical History:  Procedure Laterality Date   ABDOMINAL AORTOGRAM W/LOWER EXTREMITY N/A 06/06/2018   Procedure: ABDOMINAL AORTOGRAM W/LOWER EXTREMITY;  Surgeon: Wellington Hampshire, MD;  Location: [facility];  Service: Cardiovascular;  Laterality: N/A;   ABDOMINAL AORTOGRAM W/LOWER EXTREMITY N/A 09/18/2019   Procedure: ABDOMINAL AORTOGRAM W/LOWER EXTREMITY;  Surgeon: Wellington Hampshire, MD;  Location: [facility];  Service: Cardiovascular;  Laterality: N/A;  ABDOMINAL AORTOGRAM W/LOWER EXTREMITY N/A 01/27/2021   Procedure: ABDOMINAL AORTOGRAM W/LOWER EXTREMITY;  Surgeon: Wellington Hampshire, MD;  Location: [facility];  Service: Cardiovascular;  Laterality: N/A;   AMPUTATION Left 01/23/2020   Procedure: LEFT FIFTH TOE AMPUTATION;  Surgeon:  Waynetta Sandy, MD;  Location: [facility];  Service: Vascular;  Laterality: Left;   APPENDECTOMY     CATARACT EXTRACTION Left    CESAREAN SECTION  1990   COLONOSCOPY     FEMORAL-POPLITEAL BYPASS GRAFT Left 11/12/2019   Procedure: BYPASS GRAFT FEMORAL-ABOVE KNEE POPLITEAL ARTERY;  Surgeon: Waynetta Sandy, MD;  Location: [facility];  Service: Vascular;  Laterality: Left;   PERIPHERAL VASCULAR BALLOON ANGIOPLASTY Left 01/27/2021   Procedure: PERIPHERAL VASCULAR BALLOON ANGIOPLASTY;  Surgeon: Wellington Hampshire, MD;  Location: [facility];  Service: Cardiovascular;  Laterality: Left;  SFA graft   right tube and ovary removed     WISDOM TOOTH EXTRACTION       Current Outpatient Medications  Medication Sig Dispense Refill   aspirin EC 81 MG tablet Take 81 mg by mouth in the morning. Swallow whole.     atorvastatin (LIPITOR) 40 MG tablet TAKE 1 TABLET BY MOUTH AT BEDTIME 90 tablet 1   BIOTIN PO Take 1 tablet by mouth daily.     clopidogrel (PLAVIX) 75 MG tablet Take 1 tablet (75 mg total) by mouth daily. 30 tablet 6   COLLAGEN PO Take 1 tablet by mouth daily.     Continuous Blood Gluc Receiver (FREESTYLE LIBRE 2 READER) DEVI Use to check fasting bs and 2 hrs after largest meal 3 each 0   Continuous Blood Gluc Sensor (FREESTYLE LIBRE 2 SENSOR) MISC USE TO CHECK BLOOD SUGAR AND 2 HOURS AFTER LARGEST MEAL 3 each 0   dapagliflozin propanediol (FARXIGA) 5 MG TABS tablet Take 1 tablet (5 mg total) by mouth daily before breakfast. 90 tablet 0   insulin degludec (TRESIBA FLEXTOUCH) 100 UNIT/ML FlexTouch Pen Inject 50 units subcutaneously at bedtime 3 mL 1   metoprolol tartrate (LOPRESSOR) 50 MG tablet Take 1 tablet (50 mg total) by mouth 2 (two) times daily. 180 tablet 3   Multiple Vitamin (MULTIVITAMIN WITH MINERALS) TABS tablet Take 1 tablet by mouth daily.     pantoprazole (PROTONIX) 40 MG tablet Take 1 tablet by mouth once daily 90 tablet 1   VITAMIN D PO Take 1 tablet by mouth  daily.     Insulin Glargine (BASAGLAR KWIKPEN) 100 UNIT/ML SMARTSIG:50 Unit(s) SUB-Q Every Night     Semaglutide,0.25 or 0.'5MG'$ /DOS, (OZEMPIC, 0.25 OR 0.5 MG/DOSE,) 2 MG/1.5ML SOPN INJECT 1/2 (ONE-HALF) MG SUBCUTANEOUSLY  ONCE A WEEK (Patient not taking: Reported on 08/17/2021) 2 mL 0   No current facility-administered medications for this visit.    Allergies:   Penicillins and Metformin and related    Social History:  The patient  reports that she quit smoking about 20 years ago. Her smoking use included cigarettes. She smoked an average of .5 packs per day. She has never used smokeless tobacco. She reports that she does not drink alcohol and does not use drugs.   Family History:  The patient's family history includes Alzheimer's disease in her maternal grandmother; Fibromyalgia in her sister; Heart disease in her maternal aunt; Lung cancer in her mother; Stroke in her father.    ROS:  Please see the history of present illness.   Otherwise, review of systems are positive for none.   All other systems are  reviewed and negative.    PHYSICAL EXAM: VS:  BP 130/77   Pulse 91   Ht '5\' 4"'$  (1.626 m)   Wt 254 lb 9.6 oz (115.5 kg)   LMP  (LMP Unknown)   SpO2 96%   BMI 43.70 kg/m  , BMI Body mass index is 43.7 kg/m. GEN: Well nourished, well developed, in no acute distress  HEENT: normal  Neck: no JVD, carotid bruits, or masses Cardiac: RRR; no murmurs, rubs, or gallops,no edema  Respiratory:  clear to auscultation bilaterally, normal work of breathing GI: soft, nontender, nondistended, + BS MS: no deformity or atrophy  Skin: warm and dry, no rash Neuro:  Strength and sensation are intact Psych: euthymic mood, full affect    EKG:  EKG isordered today. EKG showed sinus rhythm with first-degree AV block, left axis deviation, low voltage and poor R wave progression in the anterior leads.  Recent Labs: 12/14/2020: ALT 15; TSH 1.080 01/12/2021: BUN 13; Creatinine, Ser 0.97; Hemoglobin  14.0; Platelets 252; Potassium 4.3; Sodium 141    Lipid Panel    Component Value Date/Time   CHOL 141 12/14/2020 1054   TRIG 61 12/14/2020 1054   HDL 44 12/14/2020 1054   CHOLHDL 3.2 12/14/2020 1054   CHOLHDL 4.0 11/13/2019 0138   VLDL 18 11/13/2019 0138   LDLCALC 84 12/14/2020 1054      Wt Readings from Last 3 Encounters:  08/17/21 254 lb 9.6 oz (115.5 kg)  03/04/21 243 lb (110.2 kg)  02/16/21 243 lb (110.2 kg)           05/22/2018    8:17 AM  PAD Screen  Previous PAD dx? No  Previous surgical procedure? No  Pain with walking? Yes  Subsides with rest? No  Feet/toe relief with dangling? No  Painful, non-healing ulcers? No  Extremities discolored? No      ASSESSMENT AND PLAN:  1.  Peripheral arterial disease: Status post left common femoral to above-knee popliteal bypass using vein for gangrenous left small toe.  Status post drug-coated balloon angioplasty to the left femoral popliteal bypass at the proximal and distal segments. Continue dual antiplatelet therapy long-term.  I requested a follow-up ABI and duplex.  She does have right calf claudication which is expected given her known occluded right SFA.  I discussed with him the importance of a walking exercise program.  2.  Essential hypertension: Continue metoprolol and losartan   3.  Hyperlipidemia: Continue treatment with atorvastatin with a target LDL of less than 70.    Recent lipid profile showed an LDL of 60.  4.  Diabetes mellitus: Most recent hemoglobin A1c improved significantly to 5.8 from 10.  5.  Inappropriate sinus tachycardia versus ectopic atrial tachycardia: I refilled metoprolol 50 mg twice daily.  6.  Exertional dyspnea: There is likely component of physical deconditioning.  Lexiscan Myoview in December showed no evidence of ischemia with normal ejection fraction.   Disposition:   FU with me in 6 months  Signed,  [name], MD  08/17/2021 11:14 AM    [place]

## 2021-08-17 NOTE — Patient Instructions (Signed)
Medication Instructions:  No changes *If you need a refill on your cardiac medications before your next appointment, please call your pharmacy*   Lab Work: None ordered If you have labs (blood work) drawn today and your tests are completely normal, you will receive your results only by: MyChart Message (if you have MyChart) OR A paper copy in the mail If you have any lab test that is abnormal or we need to change your treatment, we will call you to review the results.   Testing/Procedures: Your physician has requested that you have a lower extremity arterial duplex. During this test, ultrasound is used to evaluate arterial blood flow in the legs. Allow one hour for this exam. There are no restrictions or special instructions. This will take place at [address], [address].  Your physician has requested that you have an ankle brachial index (ABI). During this test an ultrasound and blood pressure cuff are used to evaluate the arteries that supply the arms and legs with blood. Allow thirty minutes for this exam. There are no restrictions or special instructions. This will take place at [address], [address].      Follow-Up: At [facility], you and your health needs are our priority.  As part of our continuing mission to provide you with exceptional heart care, we have created designated Provider Care Teams.  These Care Teams include your primary Cardiologist (physician) and Advanced Practice Providers (APPs -  Physician Assistants and Nurse Practitioners) who all work together to provide you with the care you need, when you need it.  We recommend signing up for the patient portal called "MyChart".  Sign up information is provided on this After Visit Summary.  MyChart is used to connect with patients for Virtual Visits (Telemedicine).  Patients are able to view lab/test results, encounter notes, upcoming appointments, etc.  Non-urgent messages can be sent to your provider as  well.   To learn more about what you can do with MyChart, go to [email or web address].    Your next appointment:   6 month(s)  The format for your next appointment:   In Person  Provider:   Dr. Arida  Important Information About Sugar       

## 2021-09-07 ENCOUNTER — Ambulatory Visit (HOSPITAL_COMMUNITY)
Admission: RE | Admit: 2021-09-07 | Discharge: 2021-09-07 | Disposition: A | Discharge status: Home / Self Care | Payer: 59 | Source: Ambulatory Visit | Attending: Cardiovascular Disease | Admitting: Cardiovascular Disease

## 2021-09-07 DIAGNOSIS — I739 Peripheral vascular disease, unspecified: Secondary | ICD-10-CM | POA: Insufficient documentation

## 2021-09-13 ENCOUNTER — Ambulatory Visit: Payer: 59 | Admitting: Podiatry

## 2021-09-15 ENCOUNTER — Other Ambulatory Visit: Payer: Self-pay | Admitting: *Deleted

## 2021-09-15 DIAGNOSIS — I739 Peripheral vascular disease, unspecified: Secondary | ICD-10-CM

## 2021-10-19 ENCOUNTER — Telehealth: Payer: Self-pay | Admitting: *Deleted

## 2021-10-19 MED ORDER — PANTOPRAZOLE SODIUM 40 MG PO TBEC: 40.0000 mg | DELAYED_RELEASE_TABLET | Freq: Every day | ORAL | 1 refills | Status: DC

## 2021-10-19 NOTE — Telephone Encounter (Signed)
[facility] has contacted our office to advise that patient is out of pantoprazole refills. I have sent an electronic refill of pantoprazole to the pharmacy.

## 2021-11-12 ENCOUNTER — Other Ambulatory Visit: Payer: Self-pay | Admitting: Physician Assistant

## 2021-11-12 DIAGNOSIS — E1165 Type 2 diabetes mellitus with hyperglycemia: Secondary | ICD-10-CM

## 2021-12-08 ENCOUNTER — Other Ambulatory Visit: Payer: Self-pay | Admitting: Physician Assistant

## 2021-12-08 DIAGNOSIS — E1165 Type 2 diabetes mellitus with hyperglycemia: Secondary | ICD-10-CM

## 2022-01-10 ENCOUNTER — Other Ambulatory Visit: Payer: Self-pay | Admitting: Cardiovascular Disease

## 2022-01-10 NOTE — Telephone Encounter (Signed)
Refill request

## 2022-02-15 ENCOUNTER — Ambulatory Visit: Payer: 59 | Attending: Cardiovascular Disease | Admitting: Cardiovascular Disease

## 2022-02-15 ENCOUNTER — Encounter: Payer: Self-pay | Admitting: Cardiovascular Disease

## 2022-02-15 VITALS — BP 132/78 | HR 90 | Ht 64.0 in | Wt 249.0 lb

## 2022-02-15 DIAGNOSIS — I10 Essential (primary) hypertension: Secondary | ICD-10-CM | POA: Diagnosis not present

## 2022-02-15 DIAGNOSIS — E785 Hyperlipidemia, unspecified: Secondary | ICD-10-CM

## 2022-02-15 DIAGNOSIS — I4711 Inappropriate sinus tachycardia, so stated: Secondary | ICD-10-CM

## 2022-02-15 DIAGNOSIS — I739 Peripheral vascular disease, unspecified: Secondary | ICD-10-CM

## 2022-02-15 MED ORDER — CLOPIDOGREL BISULFATE 75 MG PO TABS: 75.0000 mg | ORAL_TABLET | Freq: Every day | ORAL | 3 refills | Status: DC

## 2022-02-15 NOTE — Patient Instructions (Signed)
Medication Instructions:  No changes *If you need a refill on your cardiac medications before your next appointment, please call your pharmacy*   Lab Work: None ordered If you have labs (blood work) drawn today and your tests are completely normal, you will receive your results only by: [place] (if you have MyChart) OR A paper copy in the mail If you have any lab test that is abnormal or we need to change your treatment, we will call you to review the results.   Testing/Procedures: None ordered   Follow-Up: At [facility], you and your health needs are our priority.  As part of our continuing mission to provide you with exceptional heart care, we have created designated Provider Care Teams.  These Care Teams include your primary Cardiologist (physician) and Advanced Practice Providers (APPs -  Physician Assistants and Nurse Practitioners) who all work together to provide you with the care you need, when you need it.  We recommend signing up for the patient portal called "MyChart".  Sign up information is provided on this After Visit Summary.  MyChart is used to connect with patients for Virtual Visits (Telemedicine).  Patients are able to view lab/test results, encounter notes, upcoming appointments, etc.  Non-urgent messages can be sent to your provider as well.   To learn more about what you can do with MyChart, go to [email or web address].    Your next appointment:   Follow up in July after the dopplers Other Instructions EXERCISE PROGRAM FOR INDIVIDUALS WITH  PERIPHERAL ARTERIAL DISEASE (PAD)   General Information:   Research in vascular exercise has demonstrated remarkable improvement in symptoms of leg pain (claudication) without expensive or invasive interventions. Regular walking programs are extremely helpful for patients with PAD and intermittent claudication.  These steps are designed to help you get started with a safe and effective program to help you  walk farther with less pain:   Walk at least three times a week (preferably every day).  Your goal is to build up to 30-45 minutes of total walking time (not counting rest breaks). It may take you several weeks to build up your exercise time starting at 5-10 minutes or whatever you can tolerate.  Walk as far as possible using moderate to maximal pain (7-8 on the scale below) as a signal to stop, and resume walking when the pain goes away.  On a treadmill, set the speed and grade at a level that brings on the claudication pain within 3 to 5 minutes. Walk at this rate until you experience claudication of moderate severity, rest until the pain improves, and then resume walking.  Over time, you will be able to walk longer at the designated speed and grade; workload should then be increased until you develop the pain within 3 to 5 minutes once again.  This regimen will induce a significant benefit. Studies have demonstrated that participants may be able to walk up to three or four times farther and have less leg pain, within twelve weeks, by following this protocol.  Pain Scale    0_____1_____2_____3_____4_____5_____6_____7_____8_____9_____10   No Pain                                   Moderate Pain                               Maximal Pain

## 2022-02-15 NOTE — Progress Notes (Signed)
Cardiology Office Note   Date:  02/15/2022   ID:  Julie Prince, DOB April 11, 1960, MRN [id number]  PCP:  Dillard Cannon, MD  Cardiologist: Dr. Oval Linsey  Chief Complaint  Patient presents with   Follow-up      History of Present Illness: Julie Prince is a 61 y.o. female who is here today for follow-up visit regarding peripheral arterial disease.   She has history of inappropriate sinus tachycardia, diabetes and hyperlipidemia. She is a previous smoker and quit more than 20 years ago. She has known history of peripheral arterial disease. Angiography in March of 2020 showed no significant aortoiliac disease, on the left, there was flush occlusion of the SFA with reconstitution via extensive collaterals from the profunda with two-vessel runoff below the knee.  On the right side, there was diffuse disease in the proximal portion of the SFA with total occlusion in the midsegment with reconstitution distally via extensive collaterals and one-vessel runoff below the knee.  She was started on small dose cilostazol.  At that time, she had no severe claudication or lower extremity ulceration and thus medical therapy was recommended.  However, she developed gangrenous left small toe and thus repeat angiography was performed in June 2021which showed similar findings.  She underwent left common femoral to above-knee popliteal artery bypass with vein by Dr. Donzetta Matters in August, 2022.   She had resolution of claudication on the left side at that time and ulceration healed.  She was seen in late 2022 for recurrent left calf claudication with evidence of high velocities in the left femoral-popliteal bypass. Angiography was done in November of 2022 showed patent left femoral-popliteal bypass with significant stenosis proximally and distally with two-vessel runoff below the knee.  On the right side, there was severe ostial SFA disease followed by a long occlusion from the mid to the distal segment with  reconstitution via collaterals from the profunda and the popliteal artery with one-vessel runoff below the knee via the posterior tibial artery.  I performed successful drug-coated balloon angioplasty to the proximal as well as distal portion of the left femoral-popliteal bypass.    Due to exertional dyspnea, she underwent a Lexiscan Myoview In December 2022 which showed no evidence of ischemia with normal ejection fraction.  She has doing reasonably well overall with chronic atypical left-sided chest pain that has not changed.  She has occasional palpitations but symptoms are reasonably controlled on metoprolol.  No shortness of breath.  No leg claudication or ulceration.  Past Medical History:  Diagnosis Date   Allergy    Barrett esophagus    Cataract    left cataract removal   Diverticulosis    Essential hypertension, benign 09/12/2013   GERD (gastroesophageal reflux disease)    Hiatal hernia    Hyperlipidemia 09/12/2013   Inappropriate sinus tachycardia 04/12/2018   Peripheral vascular disease ([name])    PONV (postoperative nausea and vomiting)    Type 2 diabetes mellitus ([place]) 09/12/2013   Dr. Posey Pronto at [place]    Wears partial dentures    upper    Past Surgical History:  Procedure Laterality Date   ABDOMINAL AORTOGRAM W/LOWER EXTREMITY N/A 06/06/2018   Procedure: ABDOMINAL AORTOGRAM W/LOWER EXTREMITY;  Surgeon: Wellington Hampshire, MD;  Location: [place] [facility];  Service: Cardiovascular;  Laterality: N/A;   ABDOMINAL AORTOGRAM W/LOWER EXTREMITY N/A 09/18/2019   Procedure: ABDOMINAL AORTOGRAM W/LOWER EXTREMITY;  Surgeon: Wellington Hampshire, MD;  Location: [place] [facility];  Service: Cardiovascular;  Laterality: N/A;   ABDOMINAL AORTOGRAM W/LOWER EXTREMITY N/A 01/27/2021   Procedure: ABDOMINAL AORTOGRAM W/LOWER EXTREMITY;  Surgeon: Wellington Hampshire, MD;  Location: [place] [facility];  Service: Cardiovascular;  Laterality: N/A;   AMPUTATION Left 01/23/2020   Procedure:  LEFT FIFTH TOE AMPUTATION;  Surgeon: Waynetta Sandy, MD;  Location: [place];  Service: Vascular;  Laterality: Left;   APPENDECTOMY     CATARACT EXTRACTION Left    CESAREAN SECTION  1990   COLONOSCOPY     FEMORAL-POPLITEAL BYPASS GRAFT Left 11/12/2019   Procedure: BYPASS GRAFT FEMORAL-ABOVE KNEE POPLITEAL ARTERY;  Surgeon: Waynetta Sandy, MD;  Location: [place];  Service: Vascular;  Laterality: Left;   PERIPHERAL VASCULAR BALLOON ANGIOPLASTY Left 01/27/2021   Procedure: PERIPHERAL VASCULAR BALLOON ANGIOPLASTY;  Surgeon: Wellington Hampshire, MD;  Location: [place] [facility];  Service: Cardiovascular;  Laterality: Left;  SFA graft   right tube and ovary removed     WISDOM TOOTH EXTRACTION       Current Outpatient Medications  Medication Sig Dispense Refill   aspirin EC 81 MG tablet Take 81 mg by mouth in the morning. Swallow whole.     atorvastatin (LIPITOR) 40 MG tablet Take 1 tablet (40 mg total) by mouth at bedtime. 90 tablet 3   BIOTIN PO Take 1 tablet by mouth daily.     clopidogrel (PLAVIX) 75 MG tablet Take 1 tablet (75 mg total) by mouth daily. 90 tablet 3   COLLAGEN PO Take 1 tablet by mouth daily.     Continuous Blood Gluc Receiver (FREESTYLE LIBRE 2 READER) DEVI Use to check fasting bs and 2 hrs after largest meal 3 each 0   Continuous Blood Gluc Sensor (FREESTYLE LIBRE 2 SENSOR) MISC USE TO CHECK BLOOD SUGAR AND 2 HOURS AFTER LARGEST MEAL 3 each 0   FARXIGA 5 MG TABS tablet TAKE ONE TABLET BY MOUTH DAILY BEFORE BREAKFAST 60 tablet 0   insulin degludec (TRESIBA FLEXTOUCH) 100 UNIT/ML FlexTouch Pen Inject 50 units subcutaneously at bedtime (Patient taking differently: Inject 1.5 Units into the skin once a week. Inject 1.5 units once a week) 3 mL 1   Insulin Glargine (BASAGLAR KWIKPEN) 100 UNIT/ML SMARTSIG:50 Unit(s) SUB-Q Every Night     metoprolol tartrate (LOPRESSOR) 50 MG tablet Take 1 tablet (50 mg total) by mouth 2 (two) times daily. 180 tablet 3   Multiple  Vitamin (MULTIVITAMIN WITH MINERALS) TABS tablet Take 1 tablet by mouth daily.     pantoprazole (PROTONIX) 40 MG tablet Take 1 tablet (40 mg total) by mouth daily. 90 tablet 1   VITAMIN D PO Take 1 tablet by mouth daily.     No current facility-administered medications for this visit.    Allergies:   Penicillins and Metformin and related    Social History:  The patient  reports that she quit smoking about 21 years ago. Her smoking use included cigarettes. She smoked an average of .5 packs per day. She has never used smokeless tobacco. She reports that she does not drink alcohol and does not use drugs.   Family History:  The patient's family history includes Alzheimer's disease in her maternal grandmother; Fibromyalgia in her sister; Heart disease in her maternal aunt; Lung cancer in her mother; Stroke in her father.    ROS:  Please see the history of present illness.   Otherwise, review of systems are positive for none.   All other systems are reviewed and negative.    PHYSICAL EXAM: VS:  BP 132/78   Pulse 90   Ht '5\' 4"'$  (1.626 m)   Wt 249 lb (112.9 kg)   LMP  (LMP Unknown)   SpO2 97%   BMI 42.74 kg/m  , BMI Body mass index is 42.74 kg/m. GEN: Well nourished, well developed, in no acute distress  HEENT: normal  Neck: no JVD, carotid bruits, or masses Cardiac: RRR; no murmurs, rubs, or gallops,no edema  Respiratory:  clear to auscultation bilaterally, normal work of breathing GI: soft, nontender, nondistended, + BS MS: no deformity or atrophy  Skin: warm and dry, no rash Neuro:  Strength and sensation are intact Psych: euthymic mood, full affect    EKG:  EKG is not ordered today.   Recent Labs: No results found for requested labs within last 365 days.    Lipid Panel    Component Value Date/Time   CHOL 141 12/14/2020 1054   TRIG 61 12/14/2020 1054   HDL 44 12/14/2020 1054   CHOLHDL 3.2 12/14/2020 1054   CHOLHDL 4.0 11/13/2019 0138   VLDL 18 11/13/2019 0138    LDLCALC 84 12/14/2020 1054      Wt Readings from Last 3 Encounters:  02/15/22 249 lb (112.9 kg)  08/17/21 254 lb 9.6 oz (115.5 kg)  03/04/21 243 lb (110.2 kg)           05/22/2018    8:17 AM  PAD Screen  Previous PAD dx? No  Previous surgical procedure? No  Pain with walking? Yes  Subsides with rest? No  Feet/toe relief with dangling? No  Painful, non-healing ulcers? No  Extremities discolored? No      ASSESSMENT AND PLAN:  1.  Peripheral arterial disease: Status post left common femoral to above-knee popliteal bypass using vein for gangrenous left small toe.  Status post drug-coated balloon angioplasty to the left femoral popliteal bypass at the proximal and distal segments. Continue dual antiplatelet therapy long-term.  I requested the follow-up ABI and duplex to be done in June of next year.  I discussed with her the importance of a regular exercise program.  2.  Essential hypertension: Continue metoprolol and losartan   3.  Hyperlipidemia: Continue treatment with atorvastatin with a target LDL of less than 70.    Most recent lipid profile showed an LDL of 81 which is close to target.  4.  Diabetes mellitus: Most recent hemoglobin A1c improved significantly to 5.8 from 10.  5.  Inappropriate sinus tachycardia versus ectopic atrial tachycardia: I refilled metoprolol 50 mg twice daily.  6.  Morbid obesity: She is considering gastric sleeve surgery next year.  She will likely require ischemic cardiac evaluation before then.   Disposition:   FU with me in 6 months  Signed,  [name], MD  02/15/2022 10:24 AM    [name]

## 2022-02-25 ENCOUNTER — Other Ambulatory Visit: Payer: Self-pay | Admitting: Cardiovascular Disease

## 2022-02-28 ENCOUNTER — Other Ambulatory Visit: Payer: Self-pay | Admitting: Cardiovascular Disease

## 2022-03-24 ENCOUNTER — Other Ambulatory Visit: Payer: Self-pay | Admitting: Internal Medicine

## 2022-04-18 ENCOUNTER — Telehealth: Payer: Self-pay | Admitting: Cardiovascular Disease

## 2022-04-18 NOTE — Telephone Encounter (Signed)
The patient called in stating that her podiatrist recommended that she see her vein and vascular provider due to a nonhealing blister on her big toe. She has been put antibiotics for this.   She stated that she has called [facility], Dr. Claretha Cooper office for an appointment but has not been called back. She was calling to see if Dr. Fletcher Anon could get her into that office.

## 2022-04-18 NOTE — Telephone Encounter (Signed)
Appointment made for tomorrow with Dr. Fletcher Anon.

## 2022-04-18 NOTE — Progress Notes (Unsigned)
Cardiology Office Note   Date:  04/19/2022   ID:  Rosine Abe Cozart, DOB [birth year], MRN [id number]  PCP:  Dillard Cannon, MD  Cardiologist: Dr. Oval Linsey  No chief complaint on file.     History of Present Illness: Julie Prince is a 62 y.o. female who is here today for follow-up visit regarding peripheral arterial disease.   She has history of inappropriate sinus tachycardia, diabetes and hyperlipidemia. She is a previous smoker and quit more than 20 years ago. She has known history of peripheral arterial disease. Angiography in March of 2020 showed no significant aortoiliac disease, on the left, there was flush occlusion of the SFA with reconstitution via extensive collaterals from the profunda with two-vessel runoff below the knee.  On the right side, there was diffuse disease in the proximal portion of the SFA with total occlusion in the midsegment with reconstitution distally via extensive collaterals and one-vessel runoff below the knee.  She was started on small dose cilostazol.    However, she developed gangrenous left small toe and thus repeat angiography was performed in June 2021which showed similar findings.  She underwent left common femoral to above-knee popliteal artery bypass with vein by Dr. Donzetta Matters in August, 2022.   She had resolution of claudication on the left side at that time and ulceration healed.  She was seen in late 2022 for recurrent left calf claudication with evidence of high velocities in the left femoral-popliteal bypass. Angiography was done in November of 2022 showed patent left femoral-popliteal bypass with significant stenosis proximally and distally with two-vessel runoff below the knee.  On the right side, there was severe ostial SFA disease followed by a long occlusion from the mid to the distal segment with reconstitution via collaterals from the profunda and the popliteal artery with one-vessel runoff below the knee via the posterior tibial artery.  I  performed successful drug-coated balloon angioplasty to the proximal as well as distal portion of the left femoral-popliteal bypass.    Due to exertional dyspnea, she underwent a Lexiscan Myoview In December 2022 which showed no evidence of ischemia with normal ejection fraction.  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  She developed a blister on the left fourth toe that subsequently improved and seems to be healing.  However, she also noted dark discoloration involving the right big toe and was seen by podiatry.  They trimmed her toenail.  She has a large ulceration now involving the right big toe with early gangrenous changes.  The wound has been there for 3 weeks.  She denies chest pain or shortness of breath.   Past Medical History:  Diagnosis Date   Allergy    Barrett esophagus    Cataract    left cataract removal   Diverticulosis    Essential hypertension, benign 09/12/2013   GERD (gastroesophageal reflux disease)    Hiatal hernia    Hyperlipidemia 09/12/2013   Inappropriate sinus tachycardia 04/12/2018   Peripheral vascular disease ([name])    PONV (postoperative nausea and vomiting)    Type 2 diabetes mellitus ([place]) 09/12/2013   Dr. Posey Pronto at [place]    Wears partial dentures    upper    Past Surgical History:  Procedure Laterality Date   ABDOMINAL AORTOGRAM W/LOWER EXTREMITY N/A 06/06/2018   Procedure: ABDOMINAL AORTOGRAM W/LOWER EXTREMITY;  Surgeon: Wellington Hampshire, MD;  Location: [facility];  Service: Cardiovascular;  Laterality: N/A;  ABDOMINAL AORTOGRAM W/LOWER EXTREMITY N/A 09/18/2019   Procedure: ABDOMINAL AORTOGRAM W/LOWER EXTREMITY;  Surgeon: Wellington Hampshire, MD;  Location: [facility];  Service: Cardiovascular;  Laterality: N/A;   ABDOMINAL AORTOGRAM W/LOWER EXTREMITY N/A 01/27/2021   Procedure: ABDOMINAL AORTOGRAM W/LOWER EXTREMITY;  Surgeon: Wellington Hampshire, MD;  Location: [facility];  Service: Cardiovascular;  Laterality: N/A;   AMPUTATION Left 01/23/2020   Procedure: LEFT FIFTH TOE AMPUTATION;  Surgeon: Waynetta Sandy, MD;  Location: [facility];  Service: Vascular;  Laterality: Left;   APPENDECTOMY     CATARACT EXTRACTION Left    CESAREAN SECTION  1990   COLONOSCOPY     FEMORAL-POPLITEAL BYPASS GRAFT Left 11/12/2019   Procedure: BYPASS GRAFT FEMORAL-ABOVE KNEE POPLITEAL ARTERY;  Surgeon: Waynetta Sandy, MD;  Location: [facility];  Service: Vascular;  Laterality: Left;   PERIPHERAL VASCULAR BALLOON ANGIOPLASTY Left 01/27/2021   Procedure: PERIPHERAL VASCULAR BALLOON ANGIOPLASTY;  Surgeon: Wellington Hampshire, MD;  Location: [facility];  Service: Cardiovascular;  Laterality: Left;  SFA graft   right tube and ovary removed     WISDOM TOOTH EXTRACTION       Current Outpatient Medications  Medication Sig Dispense Refill   aspirin EC 81 MG tablet Take 81 mg by mouth in the morning. Swallow whole.     atorvastatin (LIPITOR) 40 MG tablet TAKE ONE TABLET BY MOUTH DAILY AT BEDTIME 30 tablet 3   BIOTIN PO Take 1 tablet by mouth daily.     clopidogrel (PLAVIX) 75 MG tablet Take 1 tablet (75 mg total) by mouth daily. 90 tablet 3   COLLAGEN PO Take 1 tablet by mouth daily.     Continuous Blood Gluc Receiver (FREESTYLE LIBRE 2 READER) DEVI Use to check fasting bs and 2 hrs after largest meal 3 each 0   Continuous Blood Gluc Sensor (FREESTYLE LIBRE 2 SENSOR) MISC USE TO CHECK BLOOD SUGAR AND 2 HOURS AFTER LARGEST MEAL 3 each 0   FARXIGA 5 MG TABS tablet TAKE ONE TABLET BY MOUTH DAILY BEFORE BREAKFAST 60 tablet 0   Insulin Glargine (BASAGLAR KWIKPEN) 100 UNIT/ML SMARTSIG:50 Unit(s) SUB-Q Every Night     metoprolol tartrate (LOPRESSOR) 50 MG tablet Take 1 tablet (50 mg total) by mouth 2 (two) times daily. 60 tablet 2   Multiple Vitamin (MULTIVITAMIN WITH MINERALS) TABS tablet Take 1 tablet by mouth daily.     pantoprazole (PROTONIX) 40 MG tablet TAKE 1  TABLET (40 MG TOTAL) BY MOUTH DAILY. 90 tablet 4   TRULICITY 1.5 DD/2.2GU SOPN Inject 3 mg into the skin once a week.     VITAMIN D PO Take 1 tablet by mouth daily.     No current facility-administered medications for this visit.    Allergies:   Penicillins and Metformin and related    Social History:  The patient  reports that she quit smoking about 21 years ago. Her smoking use included cigarettes. She smoked an average of .5 packs per day. She has never used smokeless tobacco. She reports that she does not drink alcohol and does not use drugs.   Family History:  The patient's family history includes Alzheimer's disease in her maternal grandmother; Fibromyalgia in her sister; Heart disease in her maternal aunt; Lung cancer in her mother; Stroke in her father.    ROS:  Please see the history of present illness.   Otherwise, review of systems are positive for none.   All other systems are reviewed and  negative.    PHYSICAL EXAM: VS:  BP 132/68   Pulse (!) 111   Ht '5\' 4"'$  (1.626 m)   Wt 243 lb 9.6 oz (110.5 kg)   LMP  (LMP Unknown)   SpO2 100%   BMI 41.81 kg/m  , BMI Body mass index is 41.81 kg/m. GEN: Well nourished, well developed, in no acute distress  HEENT: normal  Neck: no JVD, carotid bruits, or masses Cardiac: RRR; no murmurs, rubs, or gallops,no edema  Respiratory:  clear to auscultation bilaterally, normal work of breathing GI: soft, nontender, nondistended, + BS MS: no deformity or atrophy  Skin: warm and dry, no rash Neuro:  Strength and sensation are intact Psych: euthymic mood, full affect    EKG:  EKG is ordered today. Accelerated junctional rhythm.  Recent Labs: No results found for requested labs within last 365 days.    Lipid Panel    Component Value Date/Time   CHOL 141 12/14/2020 1054   TRIG 61 12/14/2020 1054   HDL 44 12/14/2020 1054   CHOLHDL 3.2 12/14/2020 1054   CHOLHDL 4.0 11/13/2019 0138   VLDL 18 11/13/2019 0138   LDLCALC 84 12/14/2020  1054      Wt Readings from Last 3 Encounters:  04/19/22 243 lb 9.6 oz (110.5 kg)  02/15/22 249 lb (112.9 kg)  08/17/21 254 lb 9.6 oz (115.5 kg)           05/22/2018    8:17 AM  PAD Screen  Previous PAD dx? No  Previous surgical procedure? No  Pain with walking? Yes  Subsides with rest? No  Feet/toe relief with dangling? No  Painful, non-healing ulcers? No  Extremities discolored? No      ASSESSMENT AND PLAN:  1.  Peripheral arterial disease: Status post left common femoral to above-knee popliteal bypass using vein for gangrenous left small toe.  Status post drug-coated balloon angioplasty to the left femoral popliteal bypass at the proximal and distal segments.  She now presents with critical limb ischemia involving the right lower extremity with nonhealing ulceration on the right big toe.  This is a limb threatening situation.  I recommend proceeding with urgent abdominal aortogram with lower extremity angiography and possible endovascular intervention.  I discussed the procedure in details as well as risk and benefits.  Planned access is via the left common femoral artery. We will also check the left lower extremity but ulceration involving the left fourth toe seems to be healing.  2.  Essential hypertension: Continue metoprolol and losartan   3.  Hyperlipidemia: Continue treatment with atorvastatin with a target LDL of less than 70.    Most recent lipid profile showed an LDL of 81 which is close to target.  4.  Diabetes mellitus: Most recent hemoglobin A1c improved significantly to 5.8 from 10.  5.  Inappropriate sinus tachycardia versus ectopic atrial tachycardia: She is mildly tachycardic today.  Continue metoprolol.    Disposition:   Proceed with urgent angiography tomorrow and follow-up after.  Signed,  [name], MD  04/19/2022 5:06 PM    [place]

## 2022-04-18 NOTE — H&P (View-Only) (Signed)
Cardiology Office Note   Date:  04/19/2022   ID:  Julie Prince, DOB [birth year], MRN [id number]  PCP:  Julie Cannon, MD  Cardiologist: Dr. Oval Prince  No chief complaint on file.     History of Present Illness: Julie Prince is a 62 y.o. female who is here today for follow-up visit regarding peripheral arterial disease.   She has history of inappropriate sinus tachycardia, diabetes and hyperlipidemia. She is a previous smoker and quit more than 20 years ago. She has known history of peripheral arterial disease. Angiography in March of 2020 showed no significant aortoiliac disease, on the left, there was flush occlusion of the SFA with reconstitution via extensive collaterals from the profunda with two-vessel runoff below the knee.  On the right side, there was diffuse disease in the proximal portion of the SFA with total occlusion in the midsegment with reconstitution distally via extensive collaterals and one-vessel runoff below the knee.  She was started on small dose cilostazol.    However, she developed gangrenous left small toe and thus repeat angiography was performed in June 2021which showed similar findings.  She underwent left common femoral to above-knee popliteal artery bypass with vein by Dr. Donzetta Prince in August, 2022.   She had resolution of claudication on the left side at that time and ulceration healed.  She was seen in late 2022 for recurrent left calf claudication with evidence of high velocities in the left femoral-popliteal bypass. Angiography was done in November of 2022 showed patent left femoral-popliteal bypass with significant stenosis proximally and distally with two-vessel runoff below the knee.  On the right side, there was severe ostial SFA disease followed by a long occlusion from the mid to the distal segment with reconstitution via collaterals from the profunda and the popliteal artery with one-vessel runoff below the knee via the posterior tibial artery.  I  performed successful drug-coated balloon angioplasty to the proximal as well as distal portion of the left femoral-popliteal bypass.    Due to exertional dyspnea, she underwent a Lexiscan Myoview In December 2022 which showed no evidence of ischemia with normal ejection fraction.  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  She developed a blister on the left fourth toe that subsequently improved and seems to be healing.  However, she also noted dark discoloration involving the right big toe and was seen by podiatry.  They trimmed her toenail.  She has a large ulceration now involving the right big toe with early gangrenous changes.  The wound has been there for 3 weeks.  She denies chest pain or shortness of breath.   Past Medical History:  Diagnosis Date   Allergy    Barrett esophagus    Cataract    left cataract removal   Diverticulosis    Essential hypertension, benign 09/12/2013   GERD (gastroesophageal reflux disease)    Hiatal hernia    Hyperlipidemia 09/12/2013   Inappropriate sinus tachycardia 04/12/2018   Peripheral vascular disease ([name])    PONV (postoperative nausea and vomiting)    Type 2 diabetes mellitus ([place]) 09/12/2013   Dr. Posey Pronto at [place]    Wears partial dentures    upper    Past Surgical History:  Procedure Laterality Date   ABDOMINAL AORTOGRAM W/LOWER EXTREMITY N/A 06/06/2018   Procedure: ABDOMINAL AORTOGRAM W/LOWER EXTREMITY;  Surgeon: Wellington Hampshire, MD;  Location: [facility];  Service: Cardiovascular;  Laterality: N/A;  ABDOMINAL AORTOGRAM W/LOWER EXTREMITY N/A 09/18/2019   Procedure: ABDOMINAL AORTOGRAM W/LOWER EXTREMITY;  Surgeon: Wellington Hampshire, MD;  Location: [facility];  Service: Cardiovascular;  Laterality: N/A;   ABDOMINAL AORTOGRAM W/LOWER EXTREMITY N/A 01/27/2021   Procedure: ABDOMINAL AORTOGRAM W/LOWER EXTREMITY;  Surgeon: Wellington Hampshire, MD;  Location: [facility];  Service: Cardiovascular;  Laterality: N/A;   AMPUTATION Left 01/23/2020   Procedure: LEFT FIFTH TOE AMPUTATION;  Surgeon: Waynetta Sandy, MD;  Location: [facility];  Service: Vascular;  Laterality: Left;   APPENDECTOMY     CATARACT EXTRACTION Left    CESAREAN SECTION  1990   COLONOSCOPY     FEMORAL-POPLITEAL BYPASS GRAFT Left 11/12/2019   Procedure: BYPASS GRAFT FEMORAL-ABOVE KNEE POPLITEAL ARTERY;  Surgeon: Waynetta Sandy, MD;  Location: [facility];  Service: Vascular;  Laterality: Left;   PERIPHERAL VASCULAR BALLOON ANGIOPLASTY Left 01/27/2021   Procedure: PERIPHERAL VASCULAR BALLOON ANGIOPLASTY;  Surgeon: Wellington Hampshire, MD;  Location: [facility];  Service: Cardiovascular;  Laterality: Left;  SFA graft   right tube and ovary removed     WISDOM TOOTH EXTRACTION       Current Outpatient Medications  Medication Sig Dispense Refill   aspirin EC 81 MG tablet Take 81 mg by mouth in the morning. Swallow whole.     atorvastatin (LIPITOR) 40 MG tablet TAKE ONE TABLET BY MOUTH DAILY AT BEDTIME 30 tablet 3   BIOTIN PO Take 1 tablet by mouth daily.     clopidogrel (PLAVIX) 75 MG tablet Take 1 tablet (75 mg total) by mouth daily. 90 tablet 3   COLLAGEN PO Take 1 tablet by mouth daily.     Continuous Blood Gluc Receiver (FREESTYLE LIBRE 2 READER) DEVI Use to check fasting bs and 2 hrs after largest meal 3 each 0   Continuous Blood Gluc Sensor (FREESTYLE LIBRE 2 SENSOR) MISC USE TO CHECK BLOOD SUGAR AND 2 HOURS AFTER LARGEST MEAL 3 each 0   FARXIGA 5 MG TABS tablet TAKE ONE TABLET BY MOUTH DAILY BEFORE BREAKFAST 60 tablet 0   Insulin Glargine (BASAGLAR KWIKPEN) 100 UNIT/ML SMARTSIG:50 Unit(s) SUB-Q Every Night     metoprolol tartrate (LOPRESSOR) 50 MG tablet Take 1 tablet (50 mg total) by mouth 2 (two) times daily. 60 tablet 2   Multiple Vitamin (MULTIVITAMIN WITH MINERALS) TABS tablet Take 1 tablet by mouth daily.     pantoprazole (PROTONIX) 40 MG tablet TAKE 1  TABLET (40 MG TOTAL) BY MOUTH DAILY. 90 tablet 4   TRULICITY 1.5 NU/2.7OZ SOPN Inject 3 mg into the skin once a week.     VITAMIN D PO Take 1 tablet by mouth daily.     No current facility-administered medications for this visit.    Allergies:   Penicillins and Metformin and related    Social History:  The patient  reports that she quit smoking about 21 years ago. Her smoking use included cigarettes. She smoked an average of .5 packs per day. She has never used smokeless tobacco. She reports that she does not drink alcohol and does not use drugs.   Family History:  The patient's family history includes Alzheimer's disease in her maternal grandmother; Fibromyalgia in her sister; Heart disease in her maternal aunt; Lung cancer in her mother; Stroke in her father.    ROS:  Please see the history of present illness.   Otherwise, review of systems are positive for none.   All other systems are reviewed and  negative.    PHYSICAL EXAM: VS:  BP 132/68   Pulse (!) 111   Ht '5\' 4"'$  (1.626 m)   Wt 243 lb 9.6 oz (110.5 kg)   LMP  (LMP Unknown)   SpO2 100%   BMI 41.81 kg/m  , BMI Body mass index is 41.81 kg/m. GEN: Well nourished, well developed, in no acute distress  HEENT: normal  Neck: no JVD, carotid bruits, or masses Cardiac: RRR; no murmurs, rubs, or gallops,no edema  Respiratory:  clear to auscultation bilaterally, normal work of breathing GI: soft, nontender, nondistended, + BS MS: no deformity or atrophy  Skin: warm and dry, no rash Neuro:  Strength and sensation are intact Psych: euthymic mood, full affect    EKG:  EKG is ordered today. Accelerated junctional rhythm.  Recent Labs: No results found for requested labs within last 365 days.    Lipid Panel    Component Value Date/Time   CHOL 141 12/14/2020 1054   TRIG 61 12/14/2020 1054   HDL 44 12/14/2020 1054   CHOLHDL 3.2 12/14/2020 1054   CHOLHDL 4.0 11/13/2019 0138   VLDL 18 11/13/2019 0138   LDLCALC 84 12/14/2020  1054      Wt Readings from Last 3 Encounters:  04/19/22 243 lb 9.6 oz (110.5 kg)  02/15/22 249 lb (112.9 kg)  08/17/21 254 lb 9.6 oz (115.5 kg)           05/22/2018    8:17 AM  PAD Screen  Previous PAD dx? No  Previous surgical procedure? No  Pain with walking? Yes  Subsides with rest? No  Feet/toe relief with dangling? No  Painful, non-healing ulcers? No  Extremities discolored? No      ASSESSMENT AND PLAN:  1.  Peripheral arterial disease: Status post left common femoral to above-knee popliteal bypass using vein for gangrenous left small toe.  Status post drug-coated balloon angioplasty to the left femoral popliteal bypass at the proximal and distal segments.  She now presents with critical limb ischemia involving the right lower extremity with nonhealing ulceration on the right big toe.  This is a limb threatening situation.  I recommend proceeding with urgent abdominal aortogram with lower extremity angiography and possible endovascular intervention.  I discussed the procedure in details as well as risk and benefits.  Planned access is via the left common femoral artery. We will also check the left lower extremity but ulceration involving the left fourth toe seems to be healing.  2.  Essential hypertension: Continue metoprolol and losartan   3.  Hyperlipidemia: Continue treatment with atorvastatin with a target LDL of less than 70.    Most recent lipid profile showed an LDL of 81 which is close to target.  4.  Diabetes mellitus: Most recent hemoglobin A1c improved significantly to 5.8 from 10.  5.  Inappropriate sinus tachycardia versus ectopic atrial tachycardia: She is mildly tachycardic today.  Continue metoprolol.    Disposition:   Proceed with urgent angiography tomorrow and follow-up after.  Signed,  [name], MD  04/19/2022 5:06 PM    [place]

## 2022-04-18 NOTE — Telephone Encounter (Signed)
Pt called stating "I need Dr. Fletcher Anon to call Dr. Jamse Mead to get me an appt" Dr. Jamse Mead is her Vascular MD. Please advise.

## 2022-04-18 NOTE — Telephone Encounter (Signed)
Add her to my schedule tomorrow.  She will likely require an angiogram/angioplasty which I can do. I can refer to Dr. Donzetta Matters if surgery is needed.

## 2022-04-18 NOTE — Telephone Encounter (Signed)
Pt calling to make nurse aware that info has been sent to Dr. Donzetta Matters, Podiatrist. Please advise

## 2022-04-19 ENCOUNTER — Ambulatory Visit: Payer: 59 | Attending: Cardiovascular Disease | Admitting: Cardiovascular Disease

## 2022-04-19 ENCOUNTER — Encounter: Payer: Self-pay | Admitting: Cardiovascular Disease

## 2022-04-19 VITALS — BP 132/68 | HR 111 | Ht 64.0 in | Wt 243.6 lb

## 2022-04-19 DIAGNOSIS — I4711 Inappropriate sinus tachycardia, so stated: Secondary | ICD-10-CM

## 2022-04-19 DIAGNOSIS — I739 Peripheral vascular disease, unspecified: Secondary | ICD-10-CM

## 2022-04-19 DIAGNOSIS — E785 Hyperlipidemia, unspecified: Secondary | ICD-10-CM | POA: Diagnosis not present

## 2022-04-19 DIAGNOSIS — I10 Essential (primary) hypertension: Secondary | ICD-10-CM | POA: Diagnosis not present

## 2022-04-19 MED ORDER — SODIUM CHLORIDE 0.9% FLUSH: 3.0000 mL | Freq: Two times a day (BID) | INTRAVENOUS | Status: AC

## 2022-04-19 NOTE — Patient Instructions (Addendum)
Medication Instructions:  No changes *If you need a refill on your cardiac medications before your next appointment, please call your pharmacy*   Lab Work: None ordered If you have labs (blood work) drawn today and your tests are completely normal, you will receive your results only by: [facility] (if you have MyChart) OR A paper copy in the mail If you have any lab test that is abnormal or we need to change your treatment, we will call you to review the results.   Testing/Procedures: Your physician has requested that you have a peripheral vascular angiogram. This exam is performed at the hospital. During this exam IV contrast is used to look at arterial blood flow. Please review the information sheet given for details.    Follow-Up: At [facility], you and your health needs are our priority.  As part of our continuing mission to provide you with exceptional heart care, we have created designated Provider Care Teams.  These Care Teams include your primary Cardiologist (physician) and Advanced Practice Providers (APPs -  Physician Assistants and Nurse Practitioners) who all work together to provide you with the care you need, when you need it.  We recommend signing up for the patient portal called "MyChart".  Sign up information is provided on this After Visit Summary.  MyChart is used to connect with patients for Virtual Visits (Telemedicine).  Patients are able to view lab/test results, encounter notes, upcoming appointments, etc.  Non-urgent messages can be sent to your provider as well.   To learn more about what you can do with MyChart, go to [email or web address].    Your next appointment:  Follow up pending procedure       Cardiac/Peripheral Catheterization   You are scheduled for a procedure on Wednesday, January 31 with Dr. [name].  1. Please arrive at the Main Entrance A at [facility]: [place], [place] [address] on January  31 at 6:30 AM (This time is two hours before your procedure to ensure your preparation). Free valet parking service is available. You will check in at ADMITTING. The support person will be asked to wait in the waiting room.  It is OK to have someone drop you off and come back when you are ready to be discharged.        Special note: Every effort is made to have your procedure done on time. Please understand that emergencies sometimes delay scheduled procedures.   . 2. Diet: Do not eat solid foods after midnight.  You may have clear liquids until 5 AM the day of the procedure.  3. Labs: You will need to have blood drawn on-completed 03/25/22  4. Medication instructions in preparation for your procedure: Hold all diabetic medication the morning of the procedure Take half the dose of the Insulin Glargine the night before the procedure   On the morning of your procedure, take Aspirin 81 mg and Plavix/Clopidogrel and any morning medicines NOT listed above.  You may use sips of water.  5. Plan to go home the same day, you will only stay overnight if medically necessary. 6. You MUST have a responsible adult to drive you home. 7. An adult MUST be with you the first 24 hours after you arrive home. 8. Bring a current list of your medications, and the last time and date medication taken. 9. Bring ID and current insurance cards. 10.Please wear clothes that are easy to get on and off and wear slip-on shoes.  Thank you for allowing [name] to care for you!   -- [facility]

## 2022-04-20 ENCOUNTER — Other Ambulatory Visit: Payer: Self-pay

## 2022-04-20 ENCOUNTER — Encounter (HOSPITAL_COMMUNITY)
Admission: RE | Disposition: A | Discharge status: Home / Self Care | Payer: Self-pay | Source: Home / Self Care | Attending: Cardiovascular Disease

## 2022-04-20 ENCOUNTER — Ambulatory Visit (HOSPITAL_COMMUNITY)
Admission: RE | Admit: 2022-04-20 | Discharge: 2022-04-20 | Disposition: A | Discharge status: Home / Self Care | Payer: 59 | Attending: Cardiovascular Disease | Admitting: Cardiovascular Disease

## 2022-04-20 DIAGNOSIS — E11621 Type 2 diabetes mellitus with foot ulcer: Secondary | ICD-10-CM | POA: Insufficient documentation

## 2022-04-20 DIAGNOSIS — Z794 Long term (current) use of insulin: Secondary | ICD-10-CM | POA: Diagnosis not present

## 2022-04-20 DIAGNOSIS — E785 Hyperlipidemia, unspecified: Secondary | ICD-10-CM | POA: Insufficient documentation

## 2022-04-20 DIAGNOSIS — L97519 Non-pressure chronic ulcer of other part of right foot with unspecified severity: Secondary | ICD-10-CM | POA: Insufficient documentation

## 2022-04-20 DIAGNOSIS — E1151 Type 2 diabetes mellitus with diabetic peripheral angiopathy without gangrene: Secondary | ICD-10-CM | POA: Insufficient documentation

## 2022-04-20 DIAGNOSIS — Z79899 Other long term (current) drug therapy: Secondary | ICD-10-CM | POA: Diagnosis not present

## 2022-04-20 DIAGNOSIS — I10 Essential (primary) hypertension: Secondary | ICD-10-CM | POA: Diagnosis not present

## 2022-04-20 DIAGNOSIS — R000 Tachycardia, unspecified: Secondary | ICD-10-CM | POA: Diagnosis not present

## 2022-04-20 DIAGNOSIS — Z7984 Long term (current) use of oral hypoglycemic drugs: Secondary | ICD-10-CM | POA: Insufficient documentation

## 2022-04-20 DIAGNOSIS — I739 Peripheral vascular disease, unspecified: Secondary | ICD-10-CM

## 2022-04-20 DIAGNOSIS — Z7985 Long-term (current) use of injectable non-insulin antidiabetic drugs: Secondary | ICD-10-CM | POA: Diagnosis not present

## 2022-04-20 DIAGNOSIS — I70235 Atherosclerosis of native arteries of right leg with ulceration of other part of foot: Secondary | ICD-10-CM | POA: Insufficient documentation

## 2022-04-20 DIAGNOSIS — I779 Disorder of arteries and arterioles, unspecified: Secondary | ICD-10-CM

## 2022-04-20 HISTORY — PX: ABDOMINAL AORTOGRAM W/LOWER EXTREMITY: CATH118223

## 2022-04-20 HISTORY — PX: PERIPHERAL VASCULAR ATHERECTOMY: CATH118256

## 2022-04-20 HISTORY — PX: PERIPHERAL VASCULAR INTERVENTION: CATH118257

## 2022-04-20 LAB — CBC
HCT: 37.4 % (ref 36.0–46.0)
Hemoglobin: 12.7 g/dL (ref 12.0–15.0)
MCH: 32.8 pg (ref 26.0–34.0)
MCHC: 34 g/dL (ref 30.0–36.0)
MCV: 96.6 fL (ref 80.0–100.0)
Platelets: 263 10*3/uL (ref 150–400)
RBC: 3.87 MIL/uL (ref 3.87–5.11)
RDW: 12.6 % (ref 11.5–15.5)
WBC: 8 10*3/uL (ref 4.0–10.5)
nRBC: 0 % (ref 0.0–0.2)

## 2022-04-20 LAB — BASIC METABOLIC PANEL
Anion gap: 8 (ref 5–15)
BUN: 7 mg/dL — ABNORMAL LOW (ref 8–23)
CO2: 25 mmol/L (ref 22–32)
Calcium: 8.7 mg/dL — ABNORMAL LOW (ref 8.9–10.3)
Chloride: 106 mmol/L (ref 98–111)
Creatinine, Ser: 1.05 mg/dL — ABNORMAL HIGH (ref 0.44–1.00)
GFR, Estimated: >60 mL/min (ref >60)
Glucose, Bld: 114 mg/dL — ABNORMAL HIGH (ref 70–99)
Potassium: 3.4 mmol/L — ABNORMAL LOW (ref 3.5–5.1)
Sodium: 139 mmol/L (ref 135–145)

## 2022-04-20 LAB — GLUCOSE, CAPILLARY: Glucose-Capillary: 108 mg/dL — ABNORMAL HIGH (ref 70–99)

## 2022-04-20 LAB — POCT ACTIVATED CLOTTING TIME
Activated Clotting Time: 293 seconds
Activated Clotting Time: 325 seconds
Activated Clotting Time: 266 seconds

## 2022-04-20 SURGERY — ABDOMINAL AORTOGRAM W/LOWER EXTREMITY
Anesthesia: LOCAL | Laterality: Right

## 2022-04-20 MED ORDER — ONDANSETRON HCL 4 MG/2ML IJ SOLN: 4.0000 mg | Freq: Four times a day (QID) | INTRAMUSCULAR | Status: DC | PRN

## 2022-04-20 MED ORDER — FENTANYL CITRATE (PF) 100 MCG/2ML IJ SOLN
INTRAMUSCULAR | Status: DC | PRN
  Administered 2022-04-20: 50 ug via INTRAVENOUS
  Administered 2022-04-20: 25 ug via INTRAVENOUS

## 2022-04-20 MED ORDER — HEPARIN (PORCINE) IN NACL 1000-0.9 UT/500ML-% IV SOLN
INTRAVENOUS | Status: AC
  Filled 2022-04-20: qty 1000

## 2022-04-20 MED ORDER — SODIUM CHLORIDE 0.9 % IV SOLN: 250.0000 mL | INTRAVENOUS | Status: DC | PRN

## 2022-04-20 MED ORDER — FENTANYL CITRATE (PF) 100 MCG/2ML IJ SOLN
INTRAMUSCULAR | Status: AC
  Filled 2022-04-20: qty 2

## 2022-04-20 MED ORDER — ACETAMINOPHEN 325 MG PO TABS: 650.0000 mg | ORAL_TABLET | ORAL | Status: DC | PRN

## 2022-04-20 MED ORDER — SODIUM CHLORIDE 0.9 % IV SOLN: INTRAVENOUS | Status: DC

## 2022-04-20 MED ORDER — LIDOCAINE HCL (PF) 1 % IJ SOLN
INTRAMUSCULAR | Status: DC | PRN
  Administered 2022-04-20: 15 mL

## 2022-04-20 MED ORDER — CLOPIDOGREL BISULFATE 300 MG PO TABS
ORAL_TABLET | ORAL | Status: DC | PRN
  Administered 2022-04-20: 300 mg via ORAL

## 2022-04-20 MED ORDER — SODIUM CHLORIDE 0.9% FLUSH: 3.0000 mL | Freq: Two times a day (BID) | INTRAVENOUS | Status: DC

## 2022-04-20 MED ORDER — METOPROLOL TARTRATE 5 MG/5ML IV SOLN
INTRAVENOUS | Status: AC
  Filled 2022-04-20: qty 5

## 2022-04-20 MED ORDER — NITROGLYCERIN 1 MG/10 ML FOR IR/CATH LAB
INTRA_ARTERIAL | Status: AC
  Filled 2022-04-20: qty 10

## 2022-04-20 MED ORDER — ASPIRIN 81 MG PO CHEW
81.0000 mg | CHEWABLE_TABLET | ORAL | Status: AC
  Administered 2022-04-20: 81 mg via ORAL
  Filled 2022-04-20: qty 1

## 2022-04-20 MED ORDER — METOPROLOL TARTRATE 5 MG/5ML IV SOLN
INTRAVENOUS | Status: DC | PRN
  Administered 2022-04-20: 5 mg via INTRAVENOUS

## 2022-04-20 MED ORDER — LABETALOL HCL 5 MG/ML IV SOLN
INTRAVENOUS | Status: DC | PRN
  Administered 2022-04-20: 10 mg via INTRAVENOUS

## 2022-04-20 MED ORDER — HEPARIN SODIUM (PORCINE) 1000 UNIT/ML IJ SOLN
INTRAMUSCULAR | Status: DC | PRN
  Administered 2022-04-20: 2000 [IU] via INTRAVENOUS
  Administered 2022-04-20: 10000 [IU] via INTRAVENOUS

## 2022-04-20 MED ORDER — MIDAZOLAM HCL 2 MG/2ML IJ SOLN
INTRAMUSCULAR | Status: DC | PRN
  Administered 2022-04-20 (×2): 1 mg via INTRAVENOUS

## 2022-04-20 MED ORDER — LIDOCAINE HCL (PF) 1 % IJ SOLN
INTRAMUSCULAR | Status: AC
  Filled 2022-04-20: qty 30

## 2022-04-20 MED ORDER — HEPARIN SODIUM (PORCINE) 1000 UNIT/ML IJ SOLN
INTRAMUSCULAR | Status: AC
  Filled 2022-04-20: qty 10

## 2022-04-20 MED ORDER — HEPARIN (PORCINE) IN NACL 1000-0.9 UT/500ML-% IV SOLN
INTRAVENOUS | Status: AC
  Filled 2022-04-20: qty 500

## 2022-04-20 MED ORDER — CLOPIDOGREL BISULFATE 300 MG PO TABS
ORAL_TABLET | ORAL | Status: AC
  Filled 2022-04-20: qty 1

## 2022-04-20 MED ORDER — POTASSIUM CHLORIDE CRYS ER 20 MEQ PO TBCR
20.0000 meq | EXTENDED_RELEASE_TABLET | Freq: Once | ORAL | Status: AC
  Administered 2022-04-20: 20 meq via ORAL
  Filled 2022-04-20: qty 1

## 2022-04-20 MED ORDER — CLOPIDOGREL BISULFATE 75 MG PO TABS
75.0000 mg | ORAL_TABLET | Freq: Once | ORAL | Status: AC
  Administered 2022-04-20: 75 mg via ORAL
  Filled 2022-04-20: qty 1

## 2022-04-20 MED ORDER — SODIUM CHLORIDE 0.9% FLUSH: 3.0000 mL | INTRAVENOUS | Status: DC | PRN

## 2022-04-20 MED ORDER — HEPARIN (PORCINE) IN NACL 1000-0.9 UT/500ML-% IV SOLN
INTRAVENOUS | Status: DC | PRN
  Administered 2022-04-20 (×3): 500 mL

## 2022-04-20 MED ORDER — LABETALOL HCL 5 MG/ML IV SOLN
INTRAVENOUS | Status: AC
  Filled 2022-04-20: qty 4

## 2022-04-20 MED ORDER — NITROGLYCERIN 1 MG/10 ML FOR IR/CATH LAB
INTRA_ARTERIAL | Status: DC | PRN
  Administered 2022-04-20: 400 ug via INTRA_ARTERIAL

## 2022-04-20 MED ORDER — LABETALOL HCL 5 MG/ML IV SOLN: 10.0000 mg | INTRAVENOUS | Status: DC | PRN

## 2022-04-20 MED ORDER — IODIXANOL 320 MG/ML IV SOLN
INTRAVENOUS | Status: DC | PRN
  Administered 2022-04-20: 200 mL via INTRA_ARTERIAL

## 2022-04-20 MED ORDER — MIDAZOLAM HCL 2 MG/2ML IJ SOLN
INTRAMUSCULAR | Status: AC
  Filled 2022-04-20: qty 2

## 2022-04-20 SURGICAL SUPPLY — 38 items
BALL STERLING OTW 2.5X100X150 (BALLOONS) ×2
BALLN ADMIRAL INPACT 5X250 (BALLOONS) ×2
BALLN MUSTANG 5X120X135 (BALLOONS) ×2
BALLN STERLING OTW 2.5X100X150 (BALLOONS) ×2
BALLN STERLING OTW 4X220X150 (BALLOONS) ×2
BALLOON ADMIRAL INPACT 5X250 (BALLOONS) IMPLANT
BALLOON MUSTANG 5X120X135 (BALLOONS) IMPLANT
BALLOON STERLING OTW 4X220X150 (BALLOONS) IMPLANT
BALLOON STRLNG OTW 2.5X100X150 (BALLOONS) IMPLANT
CATH 0.018 NAVICROSS ANG 135 (CATHETERS) IMPLANT
CATH ANGIO 5F PIGTAIL 65CM (CATHETERS) IMPLANT
CATH CROSS OVER TEMPO 5F (CATHETERS) IMPLANT
CATH HAWKONE LX EXTENDED TIP (CATHETERS) IMPLANT
CATH NAVICROSS ST .035X135CM (MICROCATHETER) IMPLANT
CATH STRAIGHT 5FR 65CM (CATHETERS) IMPLANT
DEVICE CLOSURE MYNXGRIP 6/7F (Vascular Products) IMPLANT
DEVICE SPIDERFX EMB PROT 6MM (WIRE) IMPLANT
DEVICE TORQUE .014-.018 (MISCELLANEOUS) IMPLANT
GLIDEWIRE ANGLED NITR .018X260 (WIRE) IMPLANT
KIT ENCORE 26 ADVANTAGE (KITS) IMPLANT
KIT MICROPUNCTURE NIT STIFF (SHEATH) IMPLANT
KIT PV (KITS) ×2 IMPLANT
SHEATH CATAPULT 7F 45 MP (SHEATH) IMPLANT
SHEATH PINNACLE 5F 10CM (SHEATH) IMPLANT
SHEATH PINNACLE 7F 10CM (SHEATH) IMPLANT
STENT ELUVIA 6X150X130 (Permanent Stent) IMPLANT
STOPCOCK MORSE 400PSI 3WAY (MISCELLANEOUS) IMPLANT
SYR MEDRAD MARK 7 150ML (SYRINGE) ×2 IMPLANT
TAPE SHOOT N SEE (TAPE) IMPLANT
TORQUE DEVICE .014-.018 (MISCELLANEOUS) ×2
TRANSDUCER W/STOPCOCK (MISCELLANEOUS) ×2 IMPLANT
TRAY PV CATH (CUSTOM PROCEDURE TRAY) ×2 IMPLANT
TUBING CIL FLEX 10 FLL-RA (TUBING) IMPLANT
WIRE G V18X300CM (WIRE) IMPLANT
WIRE HI TORQ VERSACORE 300 (WIRE) IMPLANT
WIRE HITORQ VERSACORE ST 145CM (WIRE) IMPLANT
WIRE ROSEN-J .035X180CM (WIRE) IMPLANT
WIRE SHEPHERD 30G .018 (WIRE) IMPLANT

## 2022-04-20 NOTE — Interval H&P Note (Signed)
History and Physical Interval Note:  04/20/2022 8:36 AM  Julie Prince  has presented today for surgery, with the diagnosis of pad.  The various methods of treatment have been discussed with the patient and family. After consideration of risks, benefits and other options for treatment, the patient has consented to  Procedure(s): ABDOMINAL AORTOGRAM W/LOWER EXTREMITY (N/A) as a surgical intervention.  The patient's history has been reviewed, patient examined, no change in status, stable for surgery.  I have reviewed the patient's chart and labs.  Questions were answered to the patient's satisfaction.     [name]

## 2022-04-21 ENCOUNTER — Other Ambulatory Visit: Payer: Self-pay | Admitting: *Deleted

## 2022-04-21 ENCOUNTER — Encounter (HOSPITAL_COMMUNITY): Payer: Self-pay | Admitting: Cardiovascular Disease

## 2022-04-21 DIAGNOSIS — I739 Peripheral vascular disease, unspecified: Secondary | ICD-10-CM

## 2022-05-05 ENCOUNTER — Ambulatory Visit (HOSPITAL_COMMUNITY)
Admission: RE | Admit: 2022-05-05 | Discharge: 2022-05-05 | Disposition: A | Discharge status: Home / Self Care | Payer: 59 | Source: Ambulatory Visit | Attending: Cardiovascular Disease | Admitting: Cardiovascular Disease

## 2022-05-05 DIAGNOSIS — I70203 Unspecified atherosclerosis of native arteries of extremities, bilateral legs: Secondary | ICD-10-CM | POA: Diagnosis not present

## 2022-05-05 DIAGNOSIS — I739 Peripheral vascular disease, unspecified: Secondary | ICD-10-CM | POA: Diagnosis present

## 2022-05-05 LAB — VAS US ABI WITH/WO TBI: Right ABI: 0.88

## 2022-05-06 NOTE — Addendum Note (Signed)
Addended by: Ricci Barker on: 05/06/2022 08:25 AM   Modules accepted: Orders

## 2022-05-17 ENCOUNTER — Ambulatory Visit: Payer: 59 | Attending: Cardiovascular Disease | Admitting: Cardiovascular Disease

## 2022-05-17 ENCOUNTER — Encounter: Payer: Self-pay | Admitting: Cardiovascular Disease

## 2022-05-17 VITALS — BP 114/84 | HR 96 | Ht 64.0 in | Wt 241.2 lb

## 2022-05-17 DIAGNOSIS — I4711 Inappropriate sinus tachycardia, so stated: Secondary | ICD-10-CM

## 2022-05-17 DIAGNOSIS — I739 Peripheral vascular disease, unspecified: Secondary | ICD-10-CM | POA: Diagnosis not present

## 2022-05-17 DIAGNOSIS — E785 Hyperlipidemia, unspecified: Secondary | ICD-10-CM | POA: Diagnosis not present

## 2022-05-17 DIAGNOSIS — I10 Essential (primary) hypertension: Secondary | ICD-10-CM | POA: Diagnosis not present

## 2022-05-17 NOTE — Patient Instructions (Signed)
Medication Instructions:  No changes *If you need a refill on your cardiac medications before your next appointment, please call your pharmacy*   Lab Work: None ordered If you have labs (blood work) drawn today and your tests are completely normal, you will receive your results only by: [place] (if you have MyChart) OR A paper copy in the mail If you have any lab test that is abnormal or we need to change your treatment, we will call you to review the results.   Testing/Procedures: None ordered   Follow-Up: At [facility], you and your health needs are our priority.  As part of our continuing mission to provide you with exceptional heart care, we have created designated Provider Care Teams.  These Care Teams include your primary Cardiologist (physician) and Advanced Practice Providers (APPs -  Physician Assistants and Nurse Practitioners) who all work together to provide you with the care you need, when you need it.  We recommend signing up for the patient portal called "MyChart".  Sign up information is provided on this After Visit Summary.  MyChart is used to connect with patients for Virtual Visits (Telemedicine).  Patients are able to view lab/test results, encounter notes, upcoming appointments, etc.  Non-urgent messages can be sent to your provider as well.   To learn more about what you can do with MyChart, go to [email or web address].    Your next appointment:   3 month(s)  Provider:   Dr. Fletcher Anon

## 2022-05-17 NOTE — Progress Notes (Signed)
Cardiology Office Note   Date:  05/17/2022   ID:  Julie Prince, DOB Jan 03, 1961, MRN [id number]  PCP:  Dillard Cannon, MD  Cardiologist: Dr. Oval Linsey  No chief complaint on file.     History of Present Illness: Julie Prince is a 62 y.o. female who is here today for follow-up visit regarding peripheral arterial disease.   She has history of inappropriate sinus tachycardia, diabetes and hyperlipidemia. She is a previous smoker and quit more than 20 years ago. She has known history of peripheral arterial disease. Angiography in March of 2020 showed no significant aortoiliac disease, on the left, there was flush occlusion of the SFA with reconstitution via extensive collaterals from the profunda with two-vessel runoff below the knee.  On the right side, there was diffuse disease in the proximal portion of the SFA with total occlusion in the midsegment with reconstitution distally via extensive collaterals and one-vessel runoff below the knee.  She was started on small dose cilostazol.    However, she developed gangrenous left small toe and thus repeat angiography was performed in June 2021which showed similar findings.  She underwent left common femoral to above-knee popliteal artery bypass with vein by Dr. Donzetta Matters in August, 2022.   She had resolution of claudication on the left side at that time and ulceration healed.  She was seen in late 2022 for recurrent left calf claudication with evidence of high velocities in the left femoral-popliteal bypass. Angiography was done in November of 2022 showed patent left femoral-popliteal bypass with significant stenosis proximally and distally with two-vessel runoff below the knee.  On the right side, there was severe ostial SFA disease followed by a long occlusion from the mid to the distal segment with reconstitution via collaterals from the profunda and the popliteal artery with one-vessel runoff below the knee via the posterior tibial artery.  I  performed successful drug-coated balloon angioplasty to the proximal as well as distal portion of the left femoral-popliteal bypass.    Due to exertional dyspnea, she underwent a Lexiscan Myoview In December 2022 which showed no evidence of ischemia with normal ejection fraction.  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  She recently developed a blister on the left fourth toe that subsequently healed.  However, she developed a large ulceration on the right big toe.   I proceeded with angiography on January 31 which showed no significant aortoiliac disease, on the right side, there was severe stenosis at the ostium of the SFA with long occlusion of the mid to distal SFA with reconstitution via collaterals from the profunda and two-vessel runoff below the knee.  The posterior tibial artery occluded distally and the dominant vessel was a large peroneal artery that gave collaterals at the ankle to both dorsalis pedis and the posterior tibial artery.  I performed successful directional atherectomy and drug-eluting stent placement to the right SFA. Angiography of the left lower extremity showed patent left femoral-popliteal bypass with two-vessel runoff below the knee.  The ulceration on the right big toe is improving but has not healed yet.  Postprocedure Doppler studies showed significant improvement in ABI with patent right SFA and popliteal artery.  Past Medical History:  Diagnosis Date   Allergy    Barrett esophagus    Cataract    left cataract removal   Diverticulosis    Essential hypertension, benign 09/12/2013   GERD (gastroesophageal reflux disease)    Hiatal hernia  Hyperlipidemia 09/12/2013   Inappropriate sinus tachycardia 04/12/2018   Peripheral vascular disease ([name])    PONV (postoperative nausea and vomiting)    Type 2 diabetes mellitus ([place]) 09/12/2013   Dr. Posey Pronto at [place]    Wears partial dentures    upper     Past Surgical History:  Procedure Laterality Date   ABDOMINAL AORTOGRAM W/LOWER EXTREMITY N/A 06/06/2018   Procedure: ABDOMINAL AORTOGRAM W/LOWER EXTREMITY;  Surgeon: Wellington Hampshire, MD;  Location: [place] [facility];  Service: Cardiovascular;  Laterality: N/A;   ABDOMINAL AORTOGRAM W/LOWER EXTREMITY N/A 09/18/2019   Procedure: ABDOMINAL AORTOGRAM W/LOWER EXTREMITY;  Surgeon: Wellington Hampshire, MD;  Location: [place] [facility];  Service: Cardiovascular;  Laterality: N/A;   ABDOMINAL AORTOGRAM W/LOWER EXTREMITY N/A 01/27/2021   Procedure: ABDOMINAL AORTOGRAM W/LOWER EXTREMITY;  Surgeon: Wellington Hampshire, MD;  Location: [place] [facility];  Service: Cardiovascular;  Laterality: N/A;   ABDOMINAL AORTOGRAM W/LOWER EXTREMITY N/A 04/20/2022   Procedure: ABDOMINAL AORTOGRAM W/LOWER EXTREMITY;  Surgeon: Wellington Hampshire, MD;  Location: [place] [facility];  Service: Cardiovascular;  Laterality: N/A;   AMPUTATION Left 01/23/2020   Procedure: LEFT FIFTH TOE AMPUTATION;  Surgeon: Waynetta Sandy, MD;  Location: [place];  Service: Vascular;  Laterality: Left;   APPENDECTOMY     CATARACT EXTRACTION Left    CESAREAN SECTION  1990   COLONOSCOPY     FEMORAL-POPLITEAL BYPASS GRAFT Left 11/12/2019   Procedure: BYPASS GRAFT FEMORAL-ABOVE KNEE POPLITEAL ARTERY;  Surgeon: Waynetta Sandy, MD;  Location: [place];  Service: Vascular;  Laterality: Left;   PERIPHERAL VASCULAR ATHERECTOMY Right 04/20/2022   Procedure: PERIPHERAL VASCULAR ATHERECTOMY;  Surgeon: Wellington Hampshire, MD;  Location: [place] [facility];  Service: Cardiovascular;  Laterality: Right;  SFA   PERIPHERAL VASCULAR BALLOON ANGIOPLASTY Left 01/27/2021   Procedure: PERIPHERAL VASCULAR BALLOON ANGIOPLASTY;  Surgeon: Wellington Hampshire, MD;  Location: [place] [facility];  Service: Cardiovascular;  Laterality: Left;  SFA graft   PERIPHERAL VASCULAR INTERVENTION Right 04/20/2022   Procedure: PERIPHERAL VASCULAR INTERVENTION;  Surgeon:  Wellington Hampshire, MD;  Location: [place] [facility];  Service: Cardiovascular;  Laterality: Right;  SFA   right tube and ovary removed     WISDOM TOOTH EXTRACTION       Current Outpatient Medications  Medication Sig Dispense Refill   aspirin EC 81 MG tablet Take 81 mg by mouth in the morning. Swallow whole.     atorvastatin (LIPITOR) 40 MG tablet TAKE ONE TABLET BY MOUTH DAILY AT BEDTIME 30 tablet 3   BIOTIN PO Take 1 tablet by mouth daily.     clopidogrel (PLAVIX) 75 MG tablet Take 1 tablet (75 mg total) by mouth daily. 90 tablet 3   COLLAGEN PO Take 1 tablet by mouth daily.     Continuous Blood Gluc Receiver (FREESTYLE LIBRE 2 READER) DEVI Use to check fasting bs and 2 hrs after largest meal 3 each 0   Continuous Blood Gluc Sensor (FREESTYLE LIBRE 2 SENSOR) MISC USE TO CHECK BLOOD SUGAR AND 2 HOURS AFTER LARGEST MEAL 3 each 0   FARXIGA 5 MG TABS tablet TAKE ONE TABLET BY MOUTH DAILY BEFORE BREAKFAST 60 tablet 0   Insulin Glargine (BASAGLAR KWIKPEN) 100 UNIT/ML SMARTSIG:50 Unit(s) SUB-Q Every Night     metoprolol tartrate (LOPRESSOR) 50 MG tablet Take 1 tablet (50 mg total) by mouth 2 (two) times daily. 60 tablet 2   Multiple Vitamin (MULTIVITAMIN WITH MINERALS) TABS tablet Take  1 tablet by mouth daily.     pantoprazole (PROTONIX) 40 MG tablet TAKE 1 TABLET (40 MG TOTAL) BY MOUTH DAILY. 90 tablet 4   TRULICITY 1.5 0000000 SOPN Inject 3 mg into the skin once a week.     VITAMIN D PO Take 1 tablet by mouth daily.     Current Facility-Administered Medications  Medication Dose Route Frequency Provider Last Rate Last Admin   sodium chloride flush (NS) 0.9 % injection 3 mL  3 mL Intravenous Q12H Wellington Hampshire, MD        Allergies:   Penicillins and Metformin and related    Social History:  The patient  reports that she quit smoking about 21 years ago. Her smoking use included cigarettes. She smoked an average of .5 packs per day. She has never used smokeless tobacco. She reports that  she does not drink alcohol and does not use drugs.   Family History:  The patient's family history includes Alzheimer's disease in her maternal grandmother; Fibromyalgia in her sister; Heart disease in her maternal aunt; Lung cancer in her mother; Stroke in her father.    ROS:  Please see the history of present illness.   Otherwise, review of systems are positive for none.   All other systems are reviewed and negative.    PHYSICAL EXAM: VS:  BP 114/84   Pulse 96   Ht '5\' 4"'$  (1.626 m)   Wt 241 lb 3.2 oz (109.4 kg)   LMP  (LMP Unknown)   SpO2 97%   BMI 41.40 kg/m  , BMI Body mass index is 41.4 kg/m. GEN: Well nourished, well developed, in no acute distress  HEENT: normal  Neck: no JVD, carotid bruits, or masses Cardiac: RRR; no murmurs, rubs, or gallops,no edema  Respiratory:  clear to auscultation bilaterally, normal work of breathing GI: soft, nontender, nondistended, + BS MS: no deformity or atrophy  Skin: warm and dry, no rash Neuro:  Strength and sensation are intact Psych: euthymic mood, full affect Vascular: Normal pulses are normal bilaterally.  No groin hematoma.  Distal pulses are barely palpable.  Ulceration of the right big toe with granulation tissue at the periphery.    EKG:  EKG is not ordered today.   Recent Labs: 04/20/2022: BUN 7; Creatinine, Ser 1.05; Hemoglobin 12.7; Platelets 263; Potassium 3.4; Sodium 139    Lipid Panel    Component Value Date/Time   CHOL 141 12/14/2020 1054   TRIG 61 12/14/2020 1054   HDL 44 12/14/2020 1054   CHOLHDL 3.2 12/14/2020 1054   CHOLHDL 4.0 11/13/2019 0138   VLDL 18 11/13/2019 0138   LDLCALC 84 12/14/2020 1054      Wt Readings from Last 3 Encounters:  05/17/22 241 lb 3.2 oz (109.4 kg)  04/20/22 243 lb (110.2 kg)  04/19/22 243 lb 9.6 oz (110.5 kg)           05/22/2018    8:17 AM  PAD Screen  Previous PAD dx? No  Previous surgical procedure? No  Pain with walking? Yes  Subsides with rest? No  Feet/toe  relief with dangling? No  Painful, non-healing ulcers? No  Extremities discolored? No      ASSESSMENT AND PLAN:  1.  Peripheral arterial disease: Status post left common femoral to above-knee popliteal bypass using vein for gangrenous left small toe.  Status post drug-coated balloon angioplasty to the left femoral popliteal bypass at the proximal and distal segments.  She underwent recent revascularization of the right  SFA which was occluded.  Excellent results with improvement in ABI.  Right anterior tibial artery is occluded and posterior tibial artery is also occluded distally.  However, her right peroneal artery is large and dominant and provides excellent flow to the toes.  The ulceration on the right big toe seems to be improving but if this does not heal completely in the next 1 to 2 months, additional revascularization of the posterior tibial artery might need to be considered. Continue dual antiplatelet therapy. She has a follow-up appointment with her podiatrist today.  2.  Essential hypertension: Continue metoprolol and losartan   3.  Hyperlipidemia: Continue treatment with atorvastatin with a target LDL of less than 70.    Most recent lipid profile showed an LDL of 81 which is close to target.  4.  Diabetes mellitus: Most recent hemoglobin A1c improved significantly to 5.8 from 10.  5.  Inappropriate sinus tachycardia versus ectopic atrial tachycardia: Her heart rate is reasonably controlled on metoprolol 50 mg twice daily.    Disposition:   Follow-up with me in 3 months.  Signed,  [name], MD  05/17/2022 10:33 AM    [place]

## 2022-06-13 ENCOUNTER — Other Ambulatory Visit: Payer: Self-pay | Admitting: Internal Medicine

## 2022-08-12 NOTE — Progress Notes (Unsigned)
Cardiology Office Note   Date:  08/16/2022   ID:  Julie Prince, DOB 15-Jul-1960, MRN [id number]  PCP:  Monica Becton, MD  Cardiologist: Dr. Duke Salvia  No chief complaint on file.     History of Present Illness: Julie Prince is a 62 y.o. female who is here today for follow-up visit regarding peripheral arterial disease.   She has history of inappropriate sinus tachycardia, diabetes and hyperlipidemia. She is a previous smoker and quit more than 20 years ago. She is status post left femoral-popliteal bypass in 2021 due to an occluded left SFA with critical limb ischemia. She was seen in late 2022 for recurrent left calf claudication with evidence of high velocities in the left femoral-popliteal bypass. Angiography was done in November of 2022 showed patent left femoral-popliteal bypass with significant stenosis proximally and distally with two-vessel runoff below the knee.  On the right side, there was severe ostial SFA disease followed by a long occlusion from the mid to the distal segment with reconstitution via collaterals from the profunda and the popliteal artery with one-vessel runoff below the knee via the posterior tibial artery.  I performed successful drug-coated balloon angioplasty to the proximal as well as distal portion of the left femoral-popliteal bypass.    Due to exertional dyspnea, she underwent a Lexiscan Myoview In December 2022 which showed no evidence of ischemia with normal ejection fraction.  She currently lives in [place]. She was seen earlier this year for nonhealing ulceration on the right big toe.  I proceeded with angiography on January 31 which showed no significant aortoiliac disease, on the right side, there was severe stenosis at the ostium of the SFA with long occlusion of the mid to distal SFA with reconstitution via collaterals from the profunda and two-vessel runoff below the knee.  The posterior tibial artery occluded distally and the dominant  vessel was a large peroneal artery that gave collaterals at the ankle to both dorsalis pedis and the posterior tibial artery.  I performed successful directional atherectomy and drug-eluting stent placement to the right SFA. Angiography of the left lower extremity showed patent left femoral-popliteal bypass with two-vessel runoff below the knee.  The ulceration on the right big toe is almost completely healed.  She feels well with no chest pain or shortness of breath.  She reports worsening palpitations over the last few months with feeling of pulsation in her neck lasting few minutes.  Past Medical History:  Diagnosis Date   Allergy    Barrett esophagus    Cataract    left cataract removal   Diverticulosis    Essential hypertension, benign 09/12/2013   GERD (gastroesophageal reflux disease)    Hiatal hernia    Hyperlipidemia 09/12/2013   Inappropriate sinus tachycardia 04/12/2018   Peripheral vascular disease ([name])    PONV (postoperative nausea and vomiting)    Type 2 diabetes mellitus ([name]) 09/12/2013   Dr[facility] Allena Katz at [facility]    Wears partial dentures    upper    Past Surgical History:  Procedure Laterality Date   ABDOMINAL AORTOGRAM W/LOWER EXTREMITY N/A 06/06/2018   Procedure: ABDOMINAL AORTOGRAM W/LOWER EXTREMITY;  Surgeon: Iran Ouch, MD;  [facility];  Service: Cardiovascular;  Laterality: N/A;   ABDOMINAL AORTOGRAM W/LOWER EXTREMITY N/A 09/18/2019   Procedure: ABDOMINAL AORTOGRAM W/LOWER EXTREMITY;  Surgeon: Iran Ouch, MD;  Location: [facility];  Service: Cardiovascular;  Laterality: N/A;   ABDOMINAL AORTOGRAM W/LOWER EXTREMITY N/A  01/27/2021   Procedure: ABDOMINAL AORTOGRAM W/LOWER EXTREMITY;  Surgeon: Iran Ouch, MD;  Location: [facility];  Service: Cardiovascular;  Laterality: N/A;   ABDOMINAL AORTOGRAM W/LOWER EXTREMITY N/A 04/20/2022   Procedure: ABDOMINAL AORTOGRAM W/LOWER EXTREMITY;  Surgeon: Iran Ouch, MD;  Location: [facility];  Service: Cardiovascular;  Laterality: N/A;   AMPUTATION Left 01/23/2020   Procedure: LEFT FIFTH TOE AMPUTATION;  Surgeon: Maeola Harman, MD;  Location: [facility];  Service: Vascular;  Laterality: Left;   APPENDECTOMY     CATARACT EXTRACTION Left    CESAREAN SECTION  1990   COLONOSCOPY     FEMORAL-POPLITEAL BYPASS GRAFT Left 11/12/2019   Procedure: BYPASS GRAFT FEMORAL-ABOVE KNEE POPLITEAL ARTERY;  Surgeon: Maeola Harman, MD;  Location: [facility];  Service: Vascular;  Laterality: Left;   PERIPHERAL VASCULAR ATHERECTOMY Right 04/20/2022   Procedure: PERIPHERAL VASCULAR ATHERECTOMY;  Surgeon: Iran Ouch, MD;  [facility];  Service: Cardiovascular;  Laterality: Right;  SFA   PERIPHERAL VASCULAR BALLOON ANGIOPLASTY Left 01/27/2021   Procedure: PERIPHERAL VASCULAR BALLOON ANGIOPLASTY;  Surgeon: Iran Ouch, MD;  [facility];  Service: Cardiovascular;  Laterality: Left;  SFA graft   PERIPHERAL VASCULAR INTERVENTION Right 04/20/2022   Procedure: PERIPHERAL VASCULAR INTERVENTION;  Surgeon: Iran Ouch, MD;  [facility];  Service: Cardiovascular;  Laterality: Right;  SFA   right tube and ovary removed     WISDOM TOOTH EXTRACTION       Current Outpatient Medications  Medication Sig Dispense Refill   aspirin EC 81 MG tablet Take 81 mg by mouth in the morning. Swallow whole.     atorvastatin (LIPITOR) 40 MG tablet TAKE ONE TABLET BY MOUTH DAILY AT BEDTIME 30 tablet 3   BIOTIN PO Take 1 tablet by mouth daily.     clopidogrel (PLAVIX) 75 MG tablet Take 1 tablet (75 mg total) by mouth daily. 90 tablet 3   COLLAGEN PO Take 1 tablet by mouth daily.     Continuous Blood Gluc Receiver (FREESTYLE LIBRE 2 READER) DEVI Use to check fasting bs and 2 hrs after largest meal 3 each 0   Continuous Blood Gluc Sensor (FREESTYLE LIBRE 2 SENSOR) MISC USE TO CHECK BLOOD SUGAR AND 2 HOURS AFTER LARGEST  MEAL 3 each 0   FARXIGA 5 MG TABS tablet TAKE ONE TABLET BY MOUTH DAILY BEFORE BREAKFAST 60 tablet 0   Insulin Glargine (BASAGLAR KWIKPEN) 100 UNIT/ML SMARTSIG:50 Unit(s) SUB-Q Every Night     metoprolol tartrate (LOPRESSOR) 50 MG tablet Take 1 tablet (50 mg total) by mouth 2 (two) times daily. 60 tablet 2   Multiple Vitamin (MULTIVITAMIN WITH MINERALS) TABS tablet Take 1 tablet by mouth daily.     pantoprazole (PROTONIX) 40 MG tablet TAKE 1 TABLET (40 MG TOTAL) BY MOUTH DAILY. 90 tablet 4   TRULICITY 3 MG/0.5ML SOPN Inject 3 mg into the skin once a week.     VITAMIN D PO Take 1 tablet by mouth daily.     Current Facility-Administered Medications  Medication Dose Route Frequency Provider Last Rate Last Admin   sodium chloride flush (NS) 0.9 % injection 3 mL  3 mL Intravenous Q12H Iran Ouch, MD        Allergies:   Penicillins and Metformin and related    Social History:  The patient  reports that she quit smoking about 21 years ago. Her smoking use included cigarettes. She smoked  an average of .5 packs per day. She has never used smokeless tobacco. She reports that she does not drink alcohol and does not use drugs.   Family History:  The patient's family history includes Alzheimer's disease in her maternal grandmother; Fibromyalgia in her sister; Heart disease in her maternal aunt; Lung cancer in her mother; Stroke in her father.    ROS:  Please see the history of present illness.   Otherwise, review of systems are positive for none.   All other systems are reviewed and negative.    PHYSICAL EXAM: VS:  BP (!) 142/98 (BP Location: Left Arm, Patient Position: Sitting, Cuff Size: Normal)   Pulse 95   Ht 5\' 4"  (1.626 m)   Wt 246 lb 6.4 oz (111.8 kg)   LMP  (LMP Unknown)   SpO2 97%   BMI 42.29 kg/m  , BMI Body mass index is 42.29 kg/m. GEN: Well nourished, well developed, in no acute distress  HEENT: normal  Neck: no JVD, carotid bruits, or masses Cardiac: RRR; no murmurs,  rubs, or gallops,no edema  Respiratory:  clear to auscultation bilaterally, normal work of breathing GI: soft, nontender, nondistended, + BS MS: no deformity or atrophy  Skin: warm and dry, no rash Neuro:  Strength and sensation are intact Psych: euthymic mood, full affect Vascular: Normal pulses are normal bilaterally.   Distal pulses are barely palpable.  Ulceration of the right big toe almost completely healed.    EKG:  EKG is not ordered today.   Recent Labs: 04/20/2022: BUN 7; Creatinine, Ser 1.05; Hemoglobin 12.7; Platelets 263; Potassium 3.4; Sodium 139    Lipid Panel    Component Value Date/Time   CHOL 141 12/14/2020 1054   TRIG 61 12/14/2020 1054   HDL 44 12/14/2020 1054   CHOLHDL 3.2 12/14/2020 1054   CHOLHDL 4.0 11/13/2019 0138   VLDL 18 11/13/2019 0138   LDLCALC 84 12/14/2020 1054      Wt Readings from Last 3 Encounters:  08/16/22 246 lb 6.4 oz (111.8 kg)  05/17/22 241 lb 3.2 oz (109.4 kg)  04/19/22 243 lb 9.6 oz (110.5 kg)           05/22/2018    8:17 AM  PAD Screen  Previous PAD dx? No  Previous surgical procedure? No  Pain with walking? Yes  Subsides with rest? No  Feet/toe relief with dangling? No  Painful, non-healing ulcers? No  Extremities discolored? No      ASSESSMENT AND PLAN:  1.  Peripheral arterial disease: Status post left common femoral to above-knee popliteal bypass using vein for gangrenous left small toe.  Status post drug-coated balloon angioplasty to the left femoral popliteal bypass at the proximal and distal segments.  This post revascularization of the right SFA which was occluded.  The ulceration on the right big toe almost completely healed.  Continue dual antiplatelet therapy. Repeat studies in August.  2.  Essential hypertension: Continue metoprolol and losartan   3.  Hyperlipidemia: Continue treatment with atorvastatin with a target LDL of less than 70.    Most recent lipid profile showed an LDL of 81 which is close  to target.  4.  Diabetes mellitus: Most recent hemoglobin A1c improved significantly to 5.8 from 10.  5.  Inappropriate sinus tachycardia versus ectopic atrial tachycardia: She reports for sending intermittent palpitations.  I increased metoprolol to 100 mg twice daily.    Disposition:   Follow-up with me in 6 months.  Signed,  Lorine Bears, MD  08/16/2022 10:17 AM    [facility]

## 2022-08-16 ENCOUNTER — Encounter: Payer: Self-pay | Admitting: Cardiovascular Disease

## 2022-08-16 ENCOUNTER — Ambulatory Visit: Payer: 59 | Attending: Cardiovascular Disease | Admitting: Cardiovascular Disease

## 2022-08-16 VITALS — BP 142/98 | HR 95 | Ht 64.0 in | Wt 246.4 lb

## 2022-08-16 DIAGNOSIS — I4711 Inappropriate sinus tachycardia, so stated: Secondary | ICD-10-CM | POA: Diagnosis not present

## 2022-08-16 DIAGNOSIS — I739 Peripheral vascular disease, unspecified: Secondary | ICD-10-CM

## 2022-08-16 DIAGNOSIS — E785 Hyperlipidemia, unspecified: Secondary | ICD-10-CM

## 2022-08-16 DIAGNOSIS — I10 Essential (primary) hypertension: Secondary | ICD-10-CM | POA: Diagnosis not present

## 2022-08-16 MED ORDER — METOPROLOL TARTRATE 100 MG PO TABS: 100.0000 mg | ORAL_TABLET | Freq: Two times a day (BID) | ORAL | 1 refills | Status: DC

## 2022-08-16 NOTE — Patient Instructions (Signed)
Medication Instructions:  INCREASE Metoprolol to 100 mg twice daily  *If you need a refill on your cardiac medications before your next appointment, please call your pharmacy*   Lab Work: None ordered If you have labs (blood work) drawn today and your tests are completely normal, you will receive your results only by: MyChart Message (if you have MyChart) OR A paper copy in the mail If you have any lab test that is abnormal or we need to change your treatment, we will call you to review the results.   Testing/Procedures: Your physician has requested that you have a lower extremity arterial duplex. During this test, ultrasound is used to evaluate arterial blood flow in the legs. Allow one hour for this exam. There are no restrictions or special instructions. This will take place at [address] [facility], [address].  Your physician has requested that you have an ankle brachial index (ABI). During this test an ultrasound and blood pressure cuff are used to evaluate the arteries that supply the arms and legs with blood. Allow thirty minutes for this exam. There are no restrictions or special instructions. This will take place at [address] [facility], [address].    Follow-Up: At [facility], you and your health needs are our priority.  As part of our continuing mission to provide you with exceptional heart care, we have created designated Provider Care Teams.  These Care Teams include your primary Cardiologist (physician) and Advanced Practice Providers (APPs -  Physician Assistants and Nurse Practitioners) who all work together to provide you with the care you need, when you need it.  We recommend signing up for the patient portal called "MyChart".  Sign up information is provided on this After Visit Summary.  MyChart is used to connect with patients for Virtual Visits (Telemedicine).  Patients are able to view lab/test results, encounter notes, upcoming appointments, etc.  Non-urgent  messages can be sent to your provider as well.   To learn more about what you can do with MyChart, go to [email or web address].    Your next appointment:   6 month(s)  Provider:   Dr. Kirke Corin

## 2022-09-05 ENCOUNTER — Other Ambulatory Visit: Payer: Self-pay | Admitting: Cardiovascular Disease

## 2022-09-05 NOTE — Telephone Encounter (Signed)
Refill request

## 2022-10-24 ENCOUNTER — Ambulatory Visit (HOSPITAL_COMMUNITY)
Admission: RE | Admit: 2022-10-24 | Discharge: 2022-10-24 | Disposition: A | Discharge status: Home / Self Care | Payer: 59 | Source: Ambulatory Visit | Attending: Cardiovascular Disease | Admitting: Cardiovascular Disease

## 2022-10-24 DIAGNOSIS — I739 Peripheral vascular disease, unspecified: Secondary | ICD-10-CM | POA: Insufficient documentation

## 2022-10-24 LAB — VAS US ABI WITH/WO TBI
Left ABI: 1.14
Right ABI: 0.93

## 2022-11-10 ENCOUNTER — Other Ambulatory Visit (HOSPITAL_COMMUNITY): Payer: Self-pay | Admitting: *Deleted

## 2022-11-10 DIAGNOSIS — I739 Peripheral vascular disease, unspecified: Secondary | ICD-10-CM

## 2022-11-22 ENCOUNTER — Telehealth: Payer: Self-pay | Admitting: Cardiovascular Disease

## 2022-11-22 NOTE — Telephone Encounter (Signed)
Pt asked to speak to Misty Stanley, [place] about medications

## 2022-11-22 NOTE — Telephone Encounter (Signed)
Called the patient back. She stated that her podiatrist feels like her 4th toe on her left foot is not healing as it should. She has a follow up on 9/17 with him. She stated that  he felt she may need to have it amputated. The patient would like for Dr. Kirke Corin and Dr. Randie Heinz to be aware of the situation. She will call and update after her appointment. She would like for Dr. Randie Heinz to do the amputation if warranted.   She stated that she feels like her toe is slowly staring to heal. The color is slowly getting better. Her big toe on the left foot has healed.  She will call back with an update. Dr. Kirke Corin has been made aware.

## 2022-11-23 ENCOUNTER — Other Ambulatory Visit: Payer: Self-pay | Admitting: Cardiovascular Disease

## 2022-11-23 NOTE — Telephone Encounter (Signed)
Refill Request.  

## 2022-12-29 ENCOUNTER — Other Ambulatory Visit: Payer: Self-pay

## 2022-12-29 ENCOUNTER — Ambulatory Visit (INDEPENDENT_AMBULATORY_CARE_PROVIDER_SITE_OTHER): Payer: 59 | Admitting: Family Medicine

## 2022-12-29 VITALS — BP 134/86 | Ht 64.0 in | Wt 240.0 lb

## 2022-12-29 DIAGNOSIS — M6530 Trigger finger, unspecified finger: Secondary | ICD-10-CM

## 2022-12-29 DIAGNOSIS — M79645 Pain in left finger(s): Secondary | ICD-10-CM

## 2022-12-29 MED ORDER — METHYLPREDNISOLONE ACETATE 40 MG/ML IJ SUSP
20.0000 mg | Freq: Once | INTRAMUSCULAR | Status: AC
  Administered 2022-12-29: 20 mg via INTRA_ARTICULAR

## 2022-12-30 ENCOUNTER — Encounter: Payer: Self-pay | Admitting: Family Medicine

## 2022-12-30 NOTE — Progress Notes (Signed)
PCP: Carin Hock, PA  Subjective:   HPI: Patient is a 62 y.o. female here for left hand pain.  Patient has history of trigger fingers in the past. She feels like this is similar. Pain on volar aspect of 2nd digit, difficulty flexing/making a fist due to discomfort here. No injury or trauma. This is catching.  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 Allena Katz at [facility]    Wears partial dentures    upper    Current Outpatient Medications on File Prior to Visit  Medication Sig Dispense Refill   aspirin EC 81 MG tablet Take 81 mg by mouth in the morning. Swallow whole.     atorvastatin (LIPITOR) 40 MG tablet TAKE ONE TABLET BY MOUTH DAILY AT BEDTIME 90 tablet 2   BIOTIN PO Take 1 tablet by mouth daily.     clopidogrel (PLAVIX) 75 MG tablet TAKE 1 TABLET (75 MG TOTAL) BY MOUTH DAILY. 90 tablet 2   COLLAGEN PO Take 1 tablet by mouth daily.     Continuous Blood Gluc Receiver (FREESTYLE LIBRE 2 READER) DEVI Use to check fasting bs and 2 hrs after largest meal 3 each 0   Continuous Blood Gluc Sensor (FREESTYLE LIBRE 2 SENSOR) MISC USE TO CHECK BLOOD SUGAR AND 2 HOURS AFTER LARGEST MEAL 3 each 0   FARXIGA 5 MG TABS tablet TAKE ONE TABLET BY MOUTH DAILY BEFORE BREAKFAST 60 tablet 0   Insulin Glargine (BASAGLAR KWIKPEN) 100 UNIT/ML SMARTSIG:50 Unit(s) SUB-Q Every Night     metoprolol tartrate (LOPRESSOR) 100 MG tablet Take 1 tablet (100 mg total) by mouth 2 (two) times daily. 180 tablet 1   Multiple Vitamin (MULTIVITAMIN WITH MINERALS) TABS tablet Take 1 tablet by mouth daily.      pantoprazole (PROTONIX) 40 MG tablet TAKE 1 TABLET (40 MG TOTAL) BY MOUTH DAILY. 90 tablet 4   TRULICITY 3 MG/0.5ML SOPN Inject 3 mg into the skin once a week.     VITAMIN D PO Take 1 tablet by mouth daily.     Current Facility-Administered Medications on File Prior to Visit  Medication Dose Route Frequency Provider Last Rate Last Admin   sodium chloride flush (NS) 0.9 % injection 3 mL  3 mL Intravenous Q12H Iran Ouch, MD        Past Surgical History:  Procedure Laterality Date   ABDOMINAL AORTOGRAM W/LOWER EXTREMITY N/A 06/06/2018   Procedure: ABDOMINAL AORTOGRAM W/LOWER EXTREMITY;  Surgeon: Iran Ouch, MD;  [facility];  Service: Cardiovascular;  Laterality: N/A;   ABDOMINAL AORTOGRAM W/LOWER EXTREMITY N/A 09/18/2019   Procedure: ABDOMINAL AORTOGRAM W/LOWER EXTREMITY;  Surgeon: Iran Ouch, MD;  [facility];  Service: Cardiovascular;  Laterality: N/A;   ABDOMINAL AORTOGRAM W/LOWER EXTREMITY N/A 01/27/2021   Procedure: ABDOMINAL AORTOGRAM W/LOWER EXTREMITY;  Surgeon: Iran Ouch, MD;  [facility];  Service: Cardiovascular;  Laterality: N/A;   ABDOMINAL AORTOGRAM W/LOWER EXTREMITY N/A 04/20/2022   Procedure: ABDOMINAL AORTOGRAM W/LOWER EXTREMITY;  Surgeon: Iran Ouch, MD;  [facility];  Service: Cardiovascular;  Laterality: N/A;   AMPUTATION Left 01/23/2020   Procedure: LEFT FIFTH TOE AMPUTATION;  Surgeon: Maeola Harman, MD;  Location: [facility];  Service: Vascular;  Laterality: Left;   APPENDECTOMY     CATARACT EXTRACTION Left    CESAREAN SECTION  1990   COLONOSCOPY     FEMORAL-POPLITEAL BYPASS GRAFT Left 11/12/2019   Procedure: BYPASS GRAFT FEMORAL-ABOVE KNEE POPLITEAL ARTERY;  Surgeon: Maeola Harman, MD;  Location: [facility] OR;  Service: Vascular;  Laterality: Left;   PERIPHERAL VASCULAR ATHERECTOMY Right 04/20/2022   Procedure: PERIPHERAL VASCULAR ATHERECTOMY;  Surgeon: Iran Ouch, MD;  [facility];  Service: Cardiovascular;  Laterality: Right;  SFA   PERIPHERAL VASCULAR BALLOON ANGIOPLASTY Left 01/27/2021   Procedure: PERIPHERAL VASCULAR BALLOON ANGIOPLASTY;  Surgeon: Iran Ouch, MD;  [facility];  Service: Cardiovascular;  Laterality: Left;  SFA graft   PERIPHERAL VASCULAR INTERVENTION Right 04/20/2022   Procedure: PERIPHERAL VASCULAR INTERVENTION;  Surgeon: Iran Ouch, MD;  [facility];  Service: Cardiovascular;  Laterality: Right;  SFA   right tube and ovary removed     WISDOM TOOTH EXTRACTION      Allergies  Allergen Reactions   Penicillins Hives and Shortness Of Breath   Metformin And Related Diarrhea    BP 134/86   Ht 5\' 4"  (1.626 m)   Wt 240 lb (108.9 kg)   LMP  (LMP Unknown)   BMI 41.20 kg/m       No data to display              No data to display              Objective:  Physical Exam:  Gen: NAD, comfortable in exam room  Left hand: No deformity, swelling, bruising. Unable to fully flex at 2nd MCP joint.  Full flexion/extension otherwise.  Normal strength. Mild tenderness to palpation A1 pulley 2nd digit. NVI distally.   Limited MSK u/s left 2nd digit:  Thickening of A1 pulley.  No tenosynovitis of flexor tendon sheath.  No tendon tear.  2nd MCP and PIP appear normal.  Assessment & Plan:  1. Left 2nd digit trigger finger - discussed options and proceeded with injection.  F/u in 1 month or prn if she's doing well.  Moderate risk of complications with history of diabetes and being on plavix.  After informed written consent timeout was performed.  Patient was seated on exam table.  Area overlying 2nd digit flexor tendon sheath in area of A1 pulley prepped with alcohol swabs then utilizing ultrasound guidance, this was injected with 0.5:0.64mL lidocaine: depomedrol 40mg .  Patient tolerated procedure well without immediate complications.

## 2023-02-07 ENCOUNTER — Other Ambulatory Visit: Payer: Self-pay | Admitting: Cardiovascular Disease

## 2023-02-14 ENCOUNTER — Ambulatory Visit: Payer: 59 | Attending: Cardiovascular Disease | Admitting: Cardiovascular Disease

## 2023-02-14 ENCOUNTER — Encounter: Payer: Self-pay | Admitting: Cardiovascular Disease

## 2023-02-14 VITALS — BP 118/85 | HR 95 | Ht 64.0 in | Wt 250.0 lb

## 2023-02-14 DIAGNOSIS — E785 Hyperlipidemia, unspecified: Secondary | ICD-10-CM

## 2023-02-14 DIAGNOSIS — I10 Essential (primary) hypertension: Secondary | ICD-10-CM

## 2023-02-14 DIAGNOSIS — I4711 Inappropriate sinus tachycardia, so stated: Secondary | ICD-10-CM

## 2023-02-14 DIAGNOSIS — I739 Peripheral vascular disease, unspecified: Secondary | ICD-10-CM | POA: Diagnosis not present

## 2023-02-14 MED ORDER — METOPROLOL TARTRATE 100 MG PO TABS: 100.0000 mg | ORAL_TABLET | Freq: Two times a day (BID) | ORAL | 3 refills | Status: DC

## 2023-02-14 NOTE — Patient Instructions (Signed)
Medication Instructions:  No changes *If you need a refill on your cardiac medications before your next appointment, please call your pharmacy*   Lab Work: None ordered If you have labs (blood work) drawn today and your tests are completely normal, you will receive your results only by: MyChart Message (if you have MyChart) OR A paper copy in the mail If you have any lab test that is abnormal or we need to change your treatment, we will call you to review the results.   Testing/Procedures: None ordered   Follow-Up: At [facility], you and your health needs are our priority.  As part of our continuing mission to provide you with exceptional heart care, we have created designated Provider Care Teams.  These Care Teams include your primary Cardiologist (physician) and Advanced Practice Providers (APPs -  Physician Assistants and Nurse Practitioners) who all work together to provide you with the care you need, when you need it.  We recommend signing up for the patient portal called "MyChart".  Sign up information is provided on this After Visit Summary.  MyChart is used to connect with patients for Virtual Visits (Telemedicine).  Patients are able to view lab/test results, encounter notes, upcoming appointments, etc.  Non-urgent messages can be sent to your provider as well.   To learn more about what you can do with MyChart, go to [email or web address].    Your next appointment:   Follow up in August with Dr. Kirke Corin (same day as the dopplers)

## 2023-02-14 NOTE — Progress Notes (Signed)
Cardiology Office Note   Date:  02/14/2023   ID:  Theophilus Bones Obar, DOB 1960/03/23, MRN [id number]  PCP:  Carin Hock, PA  Cardiologist: Dr. Duke Salvia  No chief complaint on file.     History of Present Illness: Julie Prince is a 62 y.o. female who is here today for follow-up visit regarding peripheral arterial disease.   She has history of inappropriate sinus tachycardia, diabetes and hyperlipidemia. She is a previous smoker and quit more than 20 years ago. She is status post left femoral-popliteal bypass in 2021 due to an occluded left SFA with critical limb ischemia. She was seen in late 2022 for recurrent left calf claudication with evidence of high velocities in the left femoral-popliteal bypass. Angiography was done in November of 2022 showed patent left femoral-popliteal bypass with significant stenosis proximally and distally with two-vessel runoff below the knee.  I performed successful drug-coated balloon angioplasty to the proximal as well as distal portion of the left femoral-popliteal bypass.    Due to exertional dyspnea, she underwent a Lexiscan Myoview In December 2022 which showed no evidence of ischemia with normal ejection fraction.  She currently lives in [place]. She was seen earlier this year for nonhealing ulceration on the right big toe.  I proceeded with angiography in January which showed no significant aortoiliac disease, on the right side, there was severe stenosis at the ostium of the SFA with long occlusion of the mid to distal SFA with reconstitution via collaterals from the profunda and two-vessel runoff below the knee.  The posterior tibial artery occluded distally and the dominant vessel was a large peroneal artery that gave collaterals at the ankle to both dorsalis pedis and the posterior tibial artery.  I performed successful directional atherectomy and drug-eluting stent placement to the right SFA. Angiography of the left lower extremity showed  patent left femoral-popliteal bypass with two-vessel runoff below the knee.  She has been doing well with no chest pain, shortness of breath or palpitations.  No lower extremity claudication and no ulceration at this time.  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 Allena Katz at [facility]    Wears partial dentures    upper    Past Surgical History:  Procedure Laterality Date   ABDOMINAL AORTOGRAM W/LOWER EXTREMITY N/A 06/06/2018   Procedure: ABDOMINAL AORTOGRAM W/LOWER EXTREMITY;  Surgeon: Iran Ouch, MD;  [facility];  Service: Cardiovascular;  Laterality: N/A;   ABDOMINAL AORTOGRAM W/LOWER EXTREMITY N/A 09/18/2019   Procedure: ABDOMINAL AORTOGRAM W/LOWER EXTREMITY;  Surgeon: Iran Ouch, MD;  [facility];  Service: Cardiovascular;  Laterality: N/A;   ABDOMINAL AORTOGRAM W/LOWER EXTREMITY N/A 01/27/2021   Procedure: ABDOMINAL AORTOGRAM W/LOWER EXTREMITY;  Surgeon: Iran Ouch, MD;  [facility];  Service: Cardiovascular;  Laterality: N/A;   ABDOMINAL AORTOGRAM W/LOWER EXTREMITY N/A 04/20/2022   Procedure: ABDOMINAL AORTOGRAM W/LOWER EXTREMITY;  Surgeon: Iran Ouch, MD;  [facility];  Service: Cardiovascular;  Laterality: N/A;   AMPUTATION Left 01/23/2020  Procedure: LEFT FIFTH TOE AMPUTATION;  Surgeon: Maeola Harman, MD;  Location: [facility] OR;  Service: Vascular;  Laterality: Left;   APPENDECTOMY     CATARACT EXTRACTION Left    CESAREAN SECTION  1990   COLONOSCOPY     FEMORAL-POPLITEAL  BYPASS GRAFT Left 11/12/2019   Procedure: BYPASS GRAFT FEMORAL-ABOVE KNEE POPLITEAL ARTERY;  Surgeon: Maeola Harman, MD;  Location: [facility] OR;  Service: Vascular;  Laterality: Left;   PERIPHERAL VASCULAR ATHERECTOMY Right 04/20/2022   Procedure: PERIPHERAL VASCULAR ATHERECTOMY;  Surgeon: Iran Ouch, MD;  [facility];  Service: Cardiovascular;  Laterality: Right;  SFA   PERIPHERAL VASCULAR BALLOON ANGIOPLASTY Left 01/27/2021   Procedure: PERIPHERAL VASCULAR BALLOON ANGIOPLASTY;  Surgeon: Iran Ouch, MD;  [facility];  Service: Cardiovascular;  Laterality: Left;  SFA graft   PERIPHERAL VASCULAR INTERVENTION Right 04/20/2022   Procedure: PERIPHERAL VASCULAR INTERVENTION;  Surgeon: Iran Ouch, MD;  [facility];  Service: Cardiovascular;  Laterality: Right;  SFA   right tube and ovary removed     WISDOM TOOTH EXTRACTION       Current Outpatient Medications  Medication Sig Dispense Refill   aspirin EC 81 MG tablet Take 81 mg by mouth in the morning. Swallow whole.     atorvastatin (LIPITOR) 40 MG tablet TAKE ONE TABLET BY MOUTH DAILY AT BEDTIME 90 tablet 2   BIOTIN PO Take 1 tablet by mouth daily.     Continuous Blood Gluc Sensor (FREESTYLE LIBRE 2 SENSOR) MISC USE TO CHECK BLOOD SUGAR AND 2 HOURS AFTER LARGEST MEAL 3 each 0   Insulin Glargine (BASAGLAR KWIKPEN) 100 UNIT/ML SMARTSIG:50 Unit(s) SUB-Q Every Night     Multiple Vitamin (MULTIVITAMIN WITH MINERALS) TABS tablet Take 1 tablet by mouth daily.     pantoprazole (PROTONIX) 40 MG tablet TAKE 1 TABLET (40 MG TOTAL) BY MOUTH DAILY. 90 tablet 4   TRULICITY 3 MG/0.5ML SOPN Inject 3 mg into the skin once a week.     VITAMIN D PO Take 1 tablet by mouth daily.     clopidogrel (PLAVIX) 75 MG tablet TAKE 1 TABLET (75 MG TOTAL) BY MOUTH DAILY. (Patient not taking: Reported on 02/14/2023) 90 tablet 2   COLLAGEN PO Take 1 tablet by mouth daily. (Patient not taking: Reported on  02/14/2023)     Continuous Blood Gluc Receiver (FREESTYLE LIBRE 2 READER) DEVI Use to check fasting bs and 2 hrs after largest meal (Patient not taking: Reported on 02/14/2023) 3 each 0   FARXIGA 5 MG TABS tablet TAKE ONE TABLET BY MOUTH DAILY BEFORE BREAKFAST 60 tablet 0   metoprolol tartrate (LOPRESSOR) 100 MG tablet Take 1 tablet (100 mg total) by mouth 2 (two) times daily. 180 tablet 3   Current Facility-Administered Medications  Medication Dose Route Frequency Provider Last Rate Last Admin   sodium chloride flush (NS) 0.9 % injection 3 mL  3 mL Intravenous Q12H Iran Ouch, MD        Allergies:   Penicillins and Metformin and related    Social History:  The patient  reports that she quit smoking about 22 years ago. Her smoking use included cigarettes. She has never used smokeless tobacco. She reports that she does not drink alcohol and does not use drugs.   Family History:  The patient's family history includes Alzheimer's disease in her maternal grandmother; Fibromyalgia in her sister; Heart disease in her maternal aunt; Lung cancer in her mother;  Stroke in her father.    ROS:  Please see the history of present illness.   Otherwise, review of systems are positive for none.   All other systems are reviewed and negative.    PHYSICAL EXAM: VS:  BP 118/85 (BP Location: Left Arm, Patient Position: Sitting, Cuff Size: Normal)   Pulse 95   Ht 5\' 4"  (1.626 m)   Wt 250 lb (113.4 kg)   LMP  (LMP Unknown)   SpO2 98%   BMI 42.91 kg/m  , BMI Body mass index is 42.91 kg/m. GEN: Well nourished, well developed, in no acute distress  HEENT: normal  Neck: no JVD, carotid bruits, or masses Cardiac: RRR; no murmurs, rubs, or gallops,no edema  Respiratory:  clear to auscultation bilaterally, normal work of breathing GI: soft, nontender, nondistended, + BS MS: no deformity or atrophy  Skin: warm and dry, no rash Neuro:  Strength and sensation are intact Psych: euthymic mood, full  affect Vascular: Normal pulses are normal bilaterally.   Distal pulses are barely palpable.  Ulceration of the right big toe almost completely healed.    EKG:  EKG is ordered today. EKG showed: Sinus rhythm with 1st degree A-V block Left axis deviation Inferior infarct , age undetermined Poor R wave progression in the precordial leads.    Recent Labs: 04/20/2022: BUN 7; Creatinine, Ser 1.05; Hemoglobin 12.7; Platelets 263; Potassium 3.4; Sodium 139    Lipid Panel    Component Value Date/Time   CHOL 141 12/14/2020 1054   TRIG 61 12/14/2020 1054   HDL 44 12/14/2020 1054   CHOLHDL 3.2 12/14/2020 1054   CHOLHDL 4.0 11/13/2019 0138   VLDL 18 11/13/2019 0138   LDLCALC 84 12/14/2020 1054      Wt Readings from Last 3 Encounters:  02/14/23 250 lb (113.4 kg)  12/29/22 240 lb (108.9 kg)  08/16/22 246 lb 6.4 oz (111.8 kg)           05/22/2018    8:17 AM  PAD Screen  Previous PAD dx? No  Previous surgical procedure? No  Pain with walking? Yes  Subsides with rest? No  Feet/toe relief with dangling? No  Painful, non-healing ulcers? No  Extremities discolored? No      ASSESSMENT AND PLAN:  1.  Peripheral arterial disease: Status post left common femoral to above-knee popliteal bypass using vein for gangrenous left small toe.  Status post drug-coated balloon angioplasty to the left femoral popliteal bypass at the proximal and distal segments.  Status post revascularization of the occluded right SFA with atherectomy and drug-eluting stent placement.  Continue dual antiplatelet therapy.  Repeat Doppler studies in August of next year.   2.  Essential hypertension: Continue metoprolol and losartan   3.  Hyperlipidemia: Continue treatment with atorvastatin with a target LDL of less than 70.  She is going for a physical in the near future.  Her LDL is usually slightly above 70 and we should consider adding ezetimibe given PAD and diabetes.  4.  Diabetes mellitus: Most recent  hemoglobin A1c improved significantly to 5.8 from 10.  5.  Inappropriate sinus tachycardia versus ectopic atrial tachycardia: Heart rate improved after increasing metoprolol to 100 mg twice daily.    Disposition:   Follow-up in August with Doppler studies.  Signed,  Lorine Bears, MD  02/14/2023 10:39 AM    [facility]

## 2023-04-11 ENCOUNTER — Other Ambulatory Visit: Payer: Self-pay | Admitting: Internal Medicine

## 2023-04-28 ENCOUNTER — Telehealth: Payer: Self-pay | Admitting: Cardiovascular Disease

## 2023-04-28 NOTE — Telephone Encounter (Signed)
 Pt would like a c/b regarding paperwork that needs to be filled out. Please advise

## 2023-04-28 NOTE — Telephone Encounter (Signed)
 Called and spoke to patient. Verified name and DOB.Patient called about faxing over clearance for surgery paperwork, Patient was given fax number to send clearance form.

## 2023-05-15 NOTE — Telephone Encounter (Signed)
 I will send a note back to RN that we have not yet received a clearance request. Please have request faxed to [phone] attn: preop team

## 2023-05-15 NOTE — Telephone Encounter (Signed)
 Pt's is calling to check on status of clearance. Please advise

## 2023-05-15 NOTE — Telephone Encounter (Signed)
 Patient identification verified by 2 forms. Marilynn Rail, RN    Called and spoke to patient  Patient states:   -fax was sent 2 weeks ago  Informed patient:   -fax not received   -have fax sent to [phone] attn: preop team  Patient verbalized understanding, no questions at this time

## 2023-05-18 ENCOUNTER — Telehealth: Payer: Self-pay | Admitting: Cardiovascular Disease

## 2023-05-18 NOTE — Telephone Encounter (Signed)
   Pre-operative Risk Assessment    Patient Name: Julie Prince  DOB: Jun 02, 1960 MRN: [id number]   Date of last office visit: 02/14/2023 Date of next office visit: none   Request for Surgical Clearance    Procedure:   Left 1-4 hammertoe correction  Date of Surgery:  Clearance TBD                                Surgeon:  Dr. Rolanda Jay Surgeon's Group or Practice Name:  [facility] [place] Phone number:  [phone] Fax number:  [phone]   Type of Clearance Requested:   - Medical  - Pharmacy:  Hold Aspirin and Clopidogrel (Plavix) need instruction (patient may not be taking Plavix any longer)   Type of Anesthesia:  Not Indicated   Additional requests/questions:    Sharen Hones   05/18/2023, 2:13 PM

## 2023-05-19 NOTE — Telephone Encounter (Signed)
 VM full could not leave message to call back to schedule tele preop appt.

## 2023-05-19 NOTE — Telephone Encounter (Signed)
 Hold Plavix 5 days before but should continue aspirin 81 mg daily.

## 2023-05-19 NOTE — Telephone Encounter (Signed)
   Name: Julie Prince  DOB: 1960/06/08  MRN: [id number]  Primary Cardiologist: Chilton Si, MD   Preoperative team, please contact this patient and set up a phone call appointment for further preoperative risk assessment. Please obtain consent and complete medication review. Thank you for your help.  I confirm that guidance regarding antiplatelet and oral anticoagulation therapy has been completed and, if necessary, noted below.  Per office protocol, if patient is without any new symptoms or concerns at the time of their virtual visit, she may hold Plavix for 5 days prior to procedure. Please resume Plavix as soon as possible postprocedure, at the discretion of the surgeon. Regarding ASA therapy, we recommend continuation of ASA throughout the perioperative period.    I also confirmed the patient resides in the state of [place]. As per [facility] Medical Board telemedicine laws, the patient must reside in the state in which the provider is licensed.   Denyce Robert, NP 05/19/2023, 10:39 AM [facility]

## 2023-05-23 NOTE — Telephone Encounter (Signed)
 Left message to call back to schedule a tele pre op appt.  ?

## 2023-05-24 ENCOUNTER — Telehealth: Payer: Self-pay | Admitting: *Deleted

## 2023-05-24 NOTE — Telephone Encounter (Signed)
 Pt called back and has been scheduled tele preop appt 06/01/23. Med rec and consent are done.       Patient Consent for Virtual Visit        Julie Prince has provided verbal consent on 05/24/2023 for a virtual visit (video or telephone).   CONSENT FOR VIRTUAL VISIT FOR:  Julie Prince  By participating in this virtual visit I agree to the following:  I hereby voluntarily request, consent and authorize [facility] and its employed or contracted physicians, physician assistants, nurse practitioners or other licensed health care professionals (the Practitioner), to provide me with telemedicine health care services (the "Services") as deemed necessary by the treating Practitioner. I acknowledge and consent to receive the Services by the Practitioner via telemedicine. I understand that the telemedicine visit will involve communicating with the Practitioner through live audiovisual communication technology and the disclosure of certain medical information by electronic transmission. I acknowledge that I have been given the opportunity to request an in-person assessment or other available alternative prior to the telemedicine visit and am voluntarily participating in the telemedicine visit.  I understand that I have the right to withhold or withdraw my consent to the use of telemedicine in the course of my care at any time, without affecting my right to future care or treatment, and that the Practitioner or I may terminate the telemedicine visit at any time. I understand that I have the right to inspect all information obtained and/or recorded in the course of the telemedicine visit and may receive copies of available information for a reasonable fee.  I understand that some of the potential risks of receiving the Services via telemedicine include:  Delay or interruption in medical evaluation due to technological equipment failure or disruption; Information transmitted may not be sufficient  (e.g. poor resolution of images) to allow for appropriate medical decision making by the Practitioner; and/or  In rare instances, security protocols could fail, causing a breach of personal health information.  Furthermore, I acknowledge that it is my responsibility to provide information about my medical history, conditions and care that is complete and accurate to the best of my ability. I acknowledge that Practitioner's advice, recommendations, and/or decision may be based on factors not within their control, such as incomplete or inaccurate data provided by me or distortions of diagnostic images or specimens that may result from electronic transmissions. I understand that the practice of medicine is not an exact science and that Practitioner makes no warranties or guarantees regarding treatment outcomes. I acknowledge that a copy of this consent can be made available to me via my patient portal [facility] MyChart), or I can request a printed copy by calling the office of [facility].    I understand that my insurance will be billed for this visit.   I have read or had this consent read to me. I understand the contents of this consent, which adequately explains the benefits and risks of the Services being provided via telemedicine.  I have been provided ample opportunity to ask questions regarding this consent and the Services and have had my questions answered to my satisfaction. I give my informed consent for the services to be provided through the use of telemedicine in my medical care

## 2023-05-24 NOTE — Telephone Encounter (Signed)
 Pt called back and has been scheduled tele preop appt 06/01/23. Med rec and consent are done.

## 2023-06-01 ENCOUNTER — Ambulatory Visit: Attending: Cardiology

## 2023-06-01 DIAGNOSIS — Z01810 Encounter for preprocedural cardiovascular examination: Secondary | ICD-10-CM | POA: Diagnosis not present

## 2023-06-01 NOTE — Progress Notes (Signed)
 Virtual Visit via Telephone Note   Because of Julie Prince co-morbid illnesses, she is at least at moderate risk for complications without adequate follow up.  This format is felt to be most appropriate for this patient at this time.  Due to technical limitations with video connection Web designer), today's appointment will be conducted as an audio only telehealth visit, and Julie Prince verbally agreed to proceed in this manner.   All issues noted in this document were discussed and addressed.  No physical exam could be performed with this format.  Evaluation Performed:  Preoperative cardiovascular risk assessment _____________   Date:  06/01/2023   Patient ID:  Julie Prince, DOB 12-11-1960, MRN [id number] Patient Location:  Home Provider location:   Office  Primary Care Provider:  Carin Hock, [place] Primary Cardiologist:  Chilton Si, MD  Chief Complaint / Patient Profile   63 y.o. y/o female with a h/o HTN, PAD, first-degree AV block who is pending Left 1-4 hammertoe correction  and presents today for telephonic preoperative cardiovascular risk assessment.  History of Present Illness    Julie Prince is a 63 y.o. female who presents via Web designer for a telehealth visit today.  Pt was last seen in cardiology clinic on 02/14/2023 by Dr. Kirke Corin.  At that time Julie Prince was doing well .  The patient is now pending procedure as outlined above. Since her last visit, she continues to be stable from a cardiac standpoint.  Today she denies chest pain, shortness of breath, lower extremity edema, fatigue, palpitations, melena, hematuria, hemoptysis, diaphoresis, weakness, presyncope, syncope, orthopnea, and PND.   Past Medical History    Past Medical History:  Diagnosis Date   Allergy    Barrett esophagus    Cataract    left cataract removal   Diverticulosis    Essential hypertension, benign 09/12/2013   GERD (gastroesophageal reflux disease)    Hiatal  hernia    Hyperlipidemia 09/12/2013   Inappropriate sinus tachycardia ([name]) 04/12/2018   Peripheral vascular disease ([name])    PONV (postoperative nausea and vomiting)    Type 2 diabetes mellitus ([name]) 09/12/2013   Dr. Allena Katz at [facility]    Wears partial dentures    upper   Past Surgical History:  Procedure Laterality Date   ABDOMINAL AORTOGRAM W/LOWER EXTREMITY N/A 06/06/2018   Procedure: ABDOMINAL AORTOGRAM W/LOWER EXTREMITY;  Surgeon: Iran Ouch, MD;  [facility];  Service: Cardiovascular;  Laterality: N/A;   ABDOMINAL AORTOGRAM W/LOWER EXTREMITY N/A 09/18/2019   Procedure: ABDOMINAL AORTOGRAM W/LOWER EXTREMITY;  Surgeon: Iran Ouch, MD;  [facility];  Service: Cardiovascular;  Laterality: N/A;   ABDOMINAL AORTOGRAM W/LOWER EXTREMITY N/A 01/27/2021   Procedure: ABDOMINAL AORTOGRAM W/LOWER EXTREMITY;  Surgeon: Iran Ouch, MD;  [facility];  Service: Cardiovascular;  Laterality: N/A;   ABDOMINAL AORTOGRAM W/LOWER EXTREMITY N/A 04/20/2022   Procedure: ABDOMINAL AORTOGRAM W/LOWER EXTREMITY;  Surgeon: Iran Ouch, MD;  [facility];  Service: Cardiovascular;  Laterality: N/A;   AMPUTATION Left 01/23/2020   Procedure: LEFT FIFTH TOE AMPUTATION;  Surgeon: Maeola Harman, MD;  Location: [facility] OR;  Service: Vascular;  Laterality: Left;   APPENDECTOMY     CATARACT EXTRACTION Left    CESAREAN SECTION  1990   COLONOSCOPY     FEMORAL-POPLITEAL BYPASS GRAFT Left 11/12/2019   Procedure: BYPASS GRAFT FEMORAL-ABOVE KNEE POPLITEAL ARTERY;  Surgeon: Maeola Harman, MD;  [facility];  Service: Vascular;  Laterality: Left;   PERIPHERAL VASCULAR ATHERECTOMY Right 04/20/2022   Procedure: PERIPHERAL VASCULAR ATHERECTOMY;  Surgeon: Iran Ouch, MD;  [facility];  Service: Cardiovascular;  Laterality: Right;  SFA   PERIPHERAL VASCULAR BALLOON ANGIOPLASTY Left 01/27/2021    Procedure: PERIPHERAL VASCULAR BALLOON ANGIOPLASTY;  Surgeon: Iran Ouch, MD;  [facility];  Service: Cardiovascular;  Laterality: Left;  SFA graft   PERIPHERAL VASCULAR INTERVENTION Right 04/20/2022   Procedure: PERIPHERAL VASCULAR INTERVENTION;  Surgeon: Iran Ouch, MD;  [facility];  Service: Cardiovascular;  Laterality: Right;  SFA   right tube and ovary removed     WISDOM TOOTH EXTRACTION      Allergies  Allergies  Allergen Reactions   Penicillins Hives and Shortness Of Breath   Metformin And Related Diarrhea    Home Medications    Prior to Admission medications   Medication Sig Start Date End Date Taking? Authorizing Provider  aspirin EC 81 MG tablet Take 81 mg by mouth in the morning. Swallow whole.    [provider]  atorvastatin (LIPITOR) 40 MG tablet TAKE ONE TABLET BY MOUTH DAILY AT BEDTIME 11/23/22   Iran Ouch, MD  BIOTIN PO Take 1 tablet by mouth daily.    [provider]  clopidogrel (PLAVIX) 75 MG tablet TAKE 1 TABLET (75 MG TOTAL) BY MOUTH DAILY. Patient not taking: Reported on 05/24/2023 11/23/22 11/23/23  Iran Ouch, MD  COLLAGEN PO Take 1 tablet by mouth daily. Patient not taking: Reported on 02/14/2023    [provider]  Continuous Blood Gluc Receiver (FREESTYLE LIBRE 2 READER) DEVI Use to check fasting bs and 2 hrs after largest meal Patient not taking: Reported on 02/14/2023 07/09/20   Mayer Masker, PA-C  Continuous Blood Gluc Sensor (FREESTYLE LIBRE 2 SENSOR) MISC USE TO CHECK BLOOD SUGAR AND 2 HOURS AFTER LARGEST MEAL Patient not taking: Reported on 05/24/2023 01/01/21   Abonza, Kandis Cocking, PA-C  FARXIGA 5 MG TABS tablet TAKE ONE TABLET BY MOUTH DAILY BEFORE BREAKFAST Patient not taking: Reported on 05/24/2023 12/08/21   Mayer Masker, PA-C  Insulin Glargine (BASAGLAR KWIKPEN) 100 UNIT/ML SMARTSIG:50 Unit(s) SUB-Q Every Night 07/22/21   [provider]  metoprolol tartrate  (LOPRESSOR) 100 MG tablet Take 1 tablet (100 mg total) by mouth 2 (two) times daily. 02/14/23   Iran Ouch, MD  MOUNJARO 5 MG/0.5ML Pen AS DIRECTED 04/19/23   [provider]  Multiple Vitamin (MULTIVITAMIN WITH MINERALS) TABS tablet Take 1 tablet by mouth daily.    [provider]  pantoprazole (PROTONIX) 40 MG tablet TAKE 1 TABLET (40 MG TOTAL) BY MOUTH DAILY. 03/24/22   Pyrtle, Carie Caddy, MD  TRULICITY 3 MG/0.5ML SOPN Inject 3 mg into the skin once a week. 07/26/22   [provider]  VITAMIN D PO Take 1 tablet by mouth daily.    [provider]    Physical Exam    Vital Signs:  Julie Prince does not have vital signs available for review today.  Given telephonic nature of communication, physical exam is limited. AAOx3. NAD. Normal affect.  Speech and respirations are unlabored.  Accessory Clinical Findings    None  Assessment & Plan    1.  Preoperative Cardiovascular Risk Assessment:Procedure:   Left 1-4 hammertoe correction   Date of Surgery:  Clearance TBD  Surgeon:  Dr. Rolanda Jay Surgeon's Group or Practice Name:  [facility] Phone number:  [phone] Fax number:  [phone]   Primary Cardiologist: Chilton Si, MD  Chart reviewed as part of pre-operative protocol coverage. Given past medical history and time since last visit, based on ACC/AHA guidelines, Julie Prince would be at acceptable risk for the planned procedure without further cardiovascular testing.   Her RCRI is low risk, 0.9% risk of major cardiac event.  She is able to complete greater than 4 METS of physical activity.  Patient was advised that if she develops new symptoms prior to surgery to contact our office to arrange a follow-up appointment.  He verbalized understanding.  She may hold Plavix for 5 days prior to procedure. Please resume Plavix as soon as possible postprocedure, at the discretion of  the surgeon. Regarding ASA therapy, we recommend continuation of ASA throughout the perioperative period.   I will route this recommendation to the requesting party via Epic fax function and remove from pre-op pool.      Time:   Today, I have spent 5 minutes with the patient with telehealth technology discussing medical history, symptoms, and management plan.     Julie Asters, NP  06/01/2023, 7:51 AM

## 2023-06-09 ENCOUNTER — Telehealth: Payer: Self-pay | Admitting: Cardiovascular Disease

## 2023-06-09 NOTE — Telephone Encounter (Signed)
I will re-fax clearance notes from Coletta Memos, [place].

## 2023-06-09 NOTE — Telephone Encounter (Signed)
 See previous clearance encounter. Patient is requesting updates on clearance as referring office informed her they haven't received any feedback. Her appointment with Edd Fabian, NP was on 3/13. Please advise.

## 2023-06-13 ENCOUNTER — Telehealth: Payer: Self-pay

## 2023-06-13 NOTE — Telephone Encounter (Signed)
 See previous notes about preop clearance in chart.   Surgery clearance form received and fax to Dr. Kirke Corin.

## 2023-06-13 NOTE — Telephone Encounter (Signed)
 Pt is requesting a callback regarding this clearance and the notes. [phone] is the number she'd like to be called on.  Please advise.

## 2023-06-13 NOTE — Telephone Encounter (Signed)
 I s/w the pt who tells me that the surgeon's office re-faxed clearance form as they want their own form back and signed. I explained that we have protocol for the cardiologist and our preop team. ALL surgery request we receive for our pt's the form is implanted into the pt's chart into a format that is uniformed to be able to keep all notes consistent. Pt tells me the surgeon office re-faxed the request to Dr. Kirke Corin to sign. I assured the pt that I will in this case, let Dr. Kirke Corin know the surgeon office sent request to be signed.  I assured the pt that our office is not the one's causing her delay for her surgery.  Pt thanked me for the help and the call back.   I explained to the pt that ALL information the surgeon is asking from the cardiologist is ALL addressed in the the notes from Edd Fabian, FNP 06/01/23:  Evaluation Performed:  Preoperative cardiovascular risk assessment   Expand All Collapse All     Virtual Visit via Telephone Note    Because of Julie Tonya [place] co-morbid illnesses, she is at least at moderate risk for complications without adequate follow up.  This format is felt to be most appropriate for this patient at this time.  Due to technical limitations with video connection Web designer), today's appointment will be conducted as an audio only telehealth visit, and [employer] verbally agreed to proceed in this manner.   All issues noted in this document were discussed and addressed.  No physical exam could be performed with this format.   Evaluation Performed:  Preoperative cardiovascular risk assessment _____________    Date:  06/01/2023    Patient ID:  Julie Prince, DOB 10-15-1960, MRN [id number] Patient Location:  Home Provider location:   Office   Primary Care Provider:  Carin Hock, [place] Primary Cardiologist:  Chilton Si, MD   Chief Complaint / Patient Profile    63 y.o. y/o female with a h/o HTN, PAD, first-degree AV block who is pending Left 1-4  hammertoe correction  and presents today for telephonic preoperative cardiovascular risk assessment.   History of Present Illness    Julie Prince is a 63 y.o. female who presents via Web designer for a telehealth visit today.  Pt was last seen in cardiology clinic on 02/14/2023 by Dr. Kirke Corin.  At that time [facility] was doing well .  The patient is now pending procedure as outlined above. Since her last visit, she continues to be stable from a cardiac standpoint.   Today she denies chest pain, shortness of breath, lower extremity edema, fatigue, palpitations, melena, hematuria, hemoptysis, diaphoresis, weakness, presyncope, syncope, orthopnea, and PND.     Past Medical History        Past Medical History:  Diagnosis Date   Allergy     Barrett esophagus     Cataract      left cataract removal   Diverticulosis     Essential hypertension, benign 09/12/2013   GERD (gastroesophageal reflux disease)     Hiatal hernia     Hyperlipidemia 09/12/2013   Inappropriate sinus tachycardia ([name]) 04/12/2018   Peripheral vascular disease ([name])     PONV (postoperative nausea and vomiting)     Type 2 diabetes mellitus ([name]) 09/12/2013    Dr. Allena Katz at [facility]    Wears partial dentures      upper  Past Surgical History:  Procedure Laterality Date   ABDOMINAL AORTOGRAM W/LOWER EXTREMITY N/A 06/06/2018    Procedure: ABDOMINAL AORTOGRAM W/LOWER EXTREMITY;  Surgeon: Iran Ouch, MD;  [facility];  Service: Cardiovascular;  Laterality: N/A;   ABDOMINAL AORTOGRAM W/LOWER EXTREMITY N/A 09/18/2019    Procedure: ABDOMINAL AORTOGRAM W/LOWER EXTREMITY;  Surgeon: Iran Ouch, MD;  [facility];  Service: Cardiovascular;  Laterality: N/A;   ABDOMINAL AORTOGRAM W/LOWER EXTREMITY N/A 01/27/2021    Procedure: ABDOMINAL AORTOGRAM W/LOWER EXTREMITY;  Surgeon: Iran Ouch, MD;  [facility];  Service:  Cardiovascular;  Laterality: N/A;   ABDOMINAL AORTOGRAM W/LOWER EXTREMITY N/A 04/20/2022    Procedure: ABDOMINAL AORTOGRAM W/LOWER EXTREMITY;  Surgeon: Iran Ouch, MD;  [facility];  Service: Cardiovascular;  Laterality: N/A;   AMPUTATION Left 01/23/2020    Procedure: LEFT FIFTH TOE AMPUTATION;  Surgeon: Maeola Harman, MD;  Location: [facility] OR;  Service: Vascular;  Laterality: Left;   APPENDECTOMY       CATARACT EXTRACTION Left     CESAREAN SECTION   1990   COLONOSCOPY       FEMORAL-POPLITEAL BYPASS GRAFT Left 11/12/2019    Procedure: BYPASS GRAFT FEMORAL-ABOVE KNEE POPLITEAL ARTERY;  Surgeon: Maeola Harman, MD;  Location: [facility] OR;  Service: Vascular;  Laterality: Left;   PERIPHERAL VASCULAR ATHERECTOMY Right 04/20/2022    Procedure: PERIPHERAL VASCULAR ATHERECTOMY;  Surgeon: Iran Ouch, MD;  [facility];  Service: Cardiovascular;  Laterality: Right;  SFA   PERIPHERAL VASCULAR BALLOON ANGIOPLASTY Left 01/27/2021    Procedure: PERIPHERAL VASCULAR BALLOON ANGIOPLASTY;  Surgeon: Iran Ouch, MD;  [facility];  Service: Cardiovascular;  Laterality: Left;  SFA graft   PERIPHERAL VASCULAR INTERVENTION Right 04/20/2022    Procedure: PERIPHERAL VASCULAR INTERVENTION;  Surgeon: Iran Ouch, MD;  [facility];  Service: Cardiovascular;  Laterality: Right;  SFA   right tube and ovary removed       WISDOM TOOTH EXTRACTION              Allergies   Allergies      Allergies  Allergen Reactions   Penicillins Hives and Shortness Of Breath   Metformin And Related Diarrhea        Home Medications           Prior to Admission medications   Medication Sig Start Date End Date Taking? Authorizing Provider  aspirin EC 81 MG tablet Take 81 mg by mouth in the morning. Swallow whole.       [provider]  atorvastatin (LIPITOR) 40 MG tablet TAKE ONE TABLET BY MOUTH DAILY AT BEDTIME 11/23/22      Iran Ouch, MD  BIOTIN PO Take 1 tablet by mouth daily.       [provider]  clopidogrel (PLAVIX) 75 MG tablet TAKE 1 TABLET (75 MG TOTAL) BY MOUTH DAILY. Patient not taking: Reported on 05/24/2023 11/23/22 11/23/23   Iran Ouch, MD  COLLAGEN PO Take 1 tablet by mouth daily. Patient not taking: Reported on 02/14/2023       [provider]  Continuous Blood Gluc Receiver (FREESTYLE LIBRE 2 READER) DEVI Use to check fasting bs and 2 hrs after largest meal Patient not taking: Reported on 02/14/2023 07/09/20     Mayer Masker, PA-C  Continuous Blood Gluc Sensor (FREESTYLE LIBRE 2 SENSOR) MISC USE TO CHECK BLOOD SUGAR AND  2 HOURS AFTER LARGEST MEAL Patient not taking: Reported on 05/24/2023 01/01/21     Abonza, Kandis Cocking, PA-C  FARXIGA 5 MG TABS tablet TAKE ONE TABLET BY MOUTH DAILY BEFORE BREAKFAST Patient not taking: Reported on 05/24/2023 12/08/21     Mayer Masker, PA-C  Insulin Glargine (BASAGLAR KWIKPEN) 100 UNIT/ML SMARTSIG:50 Unit(s) SUB-Q Every Night 07/22/21     [provider]  metoprolol tartrate (LOPRESSOR) 100 MG tablet Take 1 tablet (100 mg total) by mouth 2 (two) times daily. 02/14/23     Iran Ouch, MD  MOUNJARO 5 MG/0.5ML Pen AS DIRECTED 04/19/23     [provider]  Multiple Vitamin (MULTIVITAMIN WITH MINERALS) TABS tablet Take 1 tablet by mouth daily.       [provider]  pantoprazole (PROTONIX) 40 MG tablet TAKE 1 TABLET (40 MG TOTAL) BY MOUTH DAILY. 03/24/22     Pyrtle, Carie Caddy, MD  TRULICITY 3 MG/0.5ML SOPN Inject 3 mg into the skin once a week. 07/26/22     [provider]  VITAMIN D PO Take 1 tablet by mouth daily.       [provider]      Physical Exam    Vital Signs:  Julie Tonya [place] does not have vital signs available for review today.   Given telephonic nature of communication, physical exam is limited. AAOx3. NAD. Normal affect.  Speech and respirations are unlabored.   Accessory Clinical  Findings    None   Assessment & Plan    1.  Preoperative Cardiovascular Risk Assessment:Procedure:   Left 1-4 hammertoe correction   Date of Surgery:  Clearance TBD                                  Surgeon:  Dr. Rolanda Jay Surgeon's Group or Practice Name:  [facility] Phone number:  [phone] Fax number:  [phone]     Primary Cardiologist: Chilton Si, MD   Chart reviewed as part of pre-operative protocol coverage. Given past medical history and time since last visit, based on ACC/AHA guidelines, Julie Tonya Rahl would be at acceptable risk for the planned procedure without further cardiovascular testing.    Her RCRI is low risk, 0.9% risk of major cardiac event.  She is able to complete greater than 4 METS of physical activity.   Patient was advised that if she develops new symptoms prior to surgery to contact our office to arrange a follow-up appointment.  He verbalized understanding.   She may hold Plavix for 5 days prior to procedure. Please resume Plavix as soon as possible postprocedure, at the discretion of the surgeon. Regarding ASA therapy, we recommend continuation of ASA throughout the perioperative period.    I will route this recommendation to the requesting party via Epic fax function and remove from pre-op pool.           Time:   Today, I have spent 5 minutes with the patient with telehealth technology discussing medical history, symptoms, and management plan.       Ronney Asters, NP   06/01/2023, 7:51 AM

## 2023-06-14 NOTE — Telephone Encounter (Signed)
 Ok I signed the form

## 2023-06-14 NOTE — Telephone Encounter (Signed)
 Received notes to be signed by Dr. Kirke Corin and faxed back, papers in Nurses box

## 2023-06-16 NOTE — Telephone Encounter (Signed)
 Clearance has been faxed.

## 2023-07-06 NOTE — Telephone Encounter (Signed)
 Pt is requesting a callback regarding her being told she has to do a clearance all over again and she'd like to discuss further with nurse. Please advise

## 2023-07-06 NOTE — Telephone Encounter (Signed)
   Patient Name: Julie Prince  DOB: [birth year] MRN: [id number]  Primary Cardiologist: Maudine Sos, MD  Chart reviewed as part of pre-operative protocol coverage. Given past medical history and time since last visit, based on ACC/AHA guidelines, [name] is at acceptable risk for the planned procedure without further cardiovascular testing.   She may hold Plavix for 5 days prior to procedure. Please resume Plavix as soon as possible postprocedure, at the discretion of the surgeon. Regarding ASA therapy, we recommend continuation of ASA throughout the perioperative perio     The patient was advised that if she develops new symptoms prior to surgery to contact our office to arrange for a follow-up visit, and she verbalized understanding.  I will route this recommendation to the requesting party via Epic fax function and remove from pre-op pool.  Please call with questions.  [name], Retha Cast, NP 07/06/2023, 10:25 AM

## 2023-07-06 NOTE — Telephone Encounter (Addendum)
 I will forward this to the preop APP to confirm if pt still needs to do clearance all over again. I am not seeing where this was noted.   I do see that Dr. Alvenia Aus signed the clearance form and his nurse Kenard Paul RN faxed to the requesting office.   This seems to have been completed and notes faxed for clearance to requesting office.

## 2023-07-14 ENCOUNTER — Telehealth: Payer: Self-pay | Admitting: Cardiovascular Disease

## 2023-07-14 NOTE — Telephone Encounter (Signed)
Patient is calling to talk with Dr. Kirke Corin or nurse

## 2023-07-14 NOTE — Telephone Encounter (Signed)
 Left a message for the patient to call back.

## 2023-07-17 NOTE — Telephone Encounter (Signed)
Left a message for the patient to call back if anything further was needed.

## 2023-10-13 ENCOUNTER — Other Ambulatory Visit: Payer: Self-pay | Admitting: Cardiovascular Disease

## 2023-10-13 ENCOUNTER — Telehealth: Payer: Self-pay | Admitting: Cardiovascular Disease

## 2023-10-13 MED ORDER — CLOPIDOGREL BISULFATE 75 MG PO TABS: 75.0000 mg | ORAL_TABLET | Freq: Every day | ORAL | 0 refills | Status: DC

## 2023-10-13 MED ORDER — METOPROLOL TARTRATE 100 MG PO TABS: 100.0000 mg | ORAL_TABLET | Freq: Two times a day (BID) | ORAL | 0 refills | Status: DC

## 2023-10-13 MED ORDER — ATORVASTATIN CALCIUM 40 MG PO TABS: 40.0000 mg | ORAL_TABLET | Freq: Every day | ORAL | 0 refills | Status: DC

## 2023-10-13 NOTE — Telephone Encounter (Signed)
 RX sent to requested Pharmacy

## 2023-10-13 NOTE — Telephone Encounter (Signed)
*  STAT* If patient is at the pharmacy, call can be transferred to refill team.   1. Which medications need to be refilled? (please list name of each medication and dose if known)   atorvastatin  (LIPITOR) 40 MG tablet  clopidogrel  (PLAVIX ) 75 MG tablet  metoprolol  tartrate (LOPRESSOR ) 100 MG tablet  2. Would you like to learn more about the convenience, safety, & potential cost savings by using the [facility]? No    3. Are you open to using the [facility] (Type [facility]. ). No   4. Which pharmacy/location (including street and city if local pharmacy) is medication to be sent to? [facility] @ [address] [place], [place] [address]  [phone]   5. Do they need a 30 day or 90 day supply? 90

## 2023-10-23 ENCOUNTER — Telehealth: Payer: Self-pay | Admitting: Cardiovascular Disease

## 2023-10-23 NOTE — Telephone Encounter (Signed)
 Left a message to call back.

## 2023-10-23 NOTE — Telephone Encounter (Signed)
 Pt is requesting a callback from nurse regarding her wanting to know how she should go about having her test done since she'll be gone for 2 months on 11/08/23. Please advise.

## 2023-11-12 ENCOUNTER — Other Ambulatory Visit: Payer: Self-pay | Admitting: Cardiovascular Disease

## 2024-01-25 ENCOUNTER — Ambulatory Visit: Admitting: Emergency Medicine

## 2024-02-02 ENCOUNTER — Encounter: Payer: Self-pay | Admitting: *Deleted

## 2024-02-06 ENCOUNTER — Ambulatory Visit: Attending: Emergency Medicine | Admitting: Emergency Medicine

## 2024-02-06 ENCOUNTER — Encounter: Payer: Self-pay | Admitting: Emergency Medicine

## 2024-02-06 VITALS — BP 124/86 | HR 100 | Ht 64.0 in | Wt 226.0 lb

## 2024-02-06 DIAGNOSIS — E785 Hyperlipidemia, unspecified: Secondary | ICD-10-CM | POA: Diagnosis not present

## 2024-02-06 DIAGNOSIS — I4711 Inappropriate sinus tachycardia, so stated: Secondary | ICD-10-CM | POA: Diagnosis not present

## 2024-02-06 DIAGNOSIS — I10 Essential (primary) hypertension: Secondary | ICD-10-CM | POA: Insufficient documentation

## 2024-02-06 DIAGNOSIS — I739 Peripheral vascular disease, unspecified: Secondary | ICD-10-CM | POA: Insufficient documentation

## 2024-02-06 NOTE — Patient Instructions (Signed)
 Medication Instructions:  NO CHANGES  Lab Work: NONE TO BE DONE TODAY.  Testing/Procedures: Your physician has requested that you have a lower extremity arterial duplex. This test is an ultrasound of the arteries in the legs or arms. It looks at arterial blood flow in the legs and arms. Allow one hour for Lower and Upper Arterial scans. There are no restrictions or special instructions.  Please note: We ask at that you not bring children with you during ultrasound (echo/ vascular) testing. Due to room size and safety concerns, children are not allowed in the ultrasound rooms during exams. Our front office staff cannot provide observation of children in our lobby area while testing is being conducted. An adult accompanying a patient to their appointment will only be allowed in the ultrasound room at the discretion of the ultrasound technician under special circumstances. We apologize for any inconvenience.   Your physician has requested that you have an ankle brachial index (ABI). During this test an ultrasound and blood pressure cuff are used to evaluate the arteries that supply the arms and legs with blood. Allow thirty minutes for this exam. There are no restrictions or special instructions.  Please note: We ask at that you not bring children with you during ultrasound (echo/ vascular) testing. Due to room size and safety concerns, children are not allowed in the ultrasound rooms during exams. Our front office staff cannot provide observation of children in our lobby area while testing is being conducted. An adult accompanying a patient to their appointment will only be allowed in the ultrasound room at the discretion of the ultrasound technician under special circumstances. We apologize for any inconvenience.   Follow-Up: At [facility], you and your health needs are our priority.  As part of our continuing mission to provide you with exceptional heart care, our providers are all part  of one team.  This team includes your primary Cardiologist (physician) and Advanced Practice Providers or APPs (Physician Assistants and Nurse Practitioners) who all work together to provide you with the care you need, when you need it.  Your next appointment:   1 YEAR  Provider:   DR. DARRON, MD

## 2024-02-06 NOTE — Progress Notes (Addendum)
 Cardiology Office Note:    Date:  02/06/2024  ID:  [name], DOB 04-29-1960, MRN [id number] PCP: Sheldon Netter, PA  [facility] Providers Cardiologist:  Deatrice Cage, MD       Patient Profile:       Chief Complaint: 1 year follow-up History of Present Illness:  Julie Prince is a 63 y.o. female with visit-pertinent history of peripheral arterial disease, inappropriate sinus tachycardia, diabetes, hyperlipidemia, previous smoker  She is status post left femoral-popliteal bypass in 2021 due to an occluded left SFA with critical limb ischemia.  She was seen in 2022 for recurrent left calf claudication with evidence of high velocities in the left femoral-popliteal bypass.  Angiography was done in November 2022 that showed patent left femoral-popliteal bypass with significant stenosis proximally and distally with two-vessel runoff below the knee.  She underwent successful drug-coated balloon angioplasty to the proximal as well as distal portion of the left femoral-popliteal bypass by Dr. Cage.  Due to exertional dyspnea she underwent Lexiscan  Myoview  in December 2022 which showed no evidence of ischemia with normal ejection fraction.  She was seen in early 2024 for nonhealing ulceration of the right big toe.  She underwent angiography in January 2024 which showed no significant aortoiliac disease on the right side, there was severe stenosis of the ostium of the SFA with long occlusion of the mid to distal SFA with reconstitution via collaterals from the profunda and two-vessel runoff below the knee.  The posterior tibial artery occluded distally and the dominant vessel was a large peroneal artery that gave collaterals at the ankle to both dorsalis pedis and the posterior tibial artery.  She underwent successful directional arthrectomy and drug-eluting stent placement to the right SFA.  Angiography on the left lower extremity showed patent left femoral-popliteal bypass with  two-vessel runoff below the knee.  She was last seen in clinic on 02/14/2023.  She is with no lower extremity claudication and no ulceration.  No changes were made.  She was to follow-up in August 2025 with same-day Doppler studies.  Discussed the use of AI scribe software for clinical note transcription with the patient, who gave verbal consent to proceed.  History of Present Illness Julie Prince is a 63 year old female with peripheral arterial disease who presents for routine follow-up. She is accompanied by her great grandchild.   Today she is doing well without acute cardiovascular concerns.  She experiences no major symptoms of claudication and can manage stairs, walking, exercise, and childcare without difficulty. She takes aspirin  and Plavix  without issue or side effects.  Her diabetes, previously uncontrolled, is now managed with Mounjaro 7.5 mg.  She has lost weight, decreasing from 250 pounds last year to 225 pounds currently, and aims to exercise more.  She takes atorvastatin  40 mg for cholesterol management, with her last cholesterol level at 75, close to her goal of 70.  She has a history of fast heart rates, controlled with metoprolol  100 mg twice a day. She mentions a family history of fast heart rates.  She quit smoking approximately 40 years ago and does not drink alcohol. She regularly checks her feet, with her daughter's assistance, without any ulcers noted.  She denies chest pain, dyspnea orthopnea, PND, claudication, lightheadedness, dizziness, syncope, presyncope, hematochezia, melena.    Review of systems:  Please see the history of present illness. All other systems are reviewed and otherwise negative.       Studies Reviewed:    EKG  Interpretation Date/Time:  Tuesday February 06 2024 14:23:37 EST Ventricular Rate:  100 PR Interval:  272 QRS Duration:  66 QT Interval:  322 QTC Calculation: 415 R Axis:   -46  Text Interpretation: Sinus rhythm with 1st  degree A-V block Left axis deviation Inferior infarct , age undetermined Anterolateral infarct , age undetermined When compared with ECG of 17-Mar-2018 15:41, PREVIOUS ECG IS PRESENT Confirmed by Rana Dixon [phone]) on 02/06/2024 2:29:37 PM    Lexiscan  Myoview  03/05/2021   Findings are consistent with no prior ischemia. The study is low risk.   No ST deviation was noted.   LV perfusion is abnormal. Defect 1: There is a small defect with moderate reduction in uptake present in the apical lateral location(s) that is fixed. Wall motion normal in this region and therefore defect more likely to be related to artifact versus infarction.   Left ventricular function is normal. Nuclear stress EF: 69 %. The left ventricular ejection fraction is hyperdynamic (>65%). End diastolic cavity size is normal.   Prior study not available for comparison.  Risk Assessment/Calculations:              Physical Exam:   VS:  BP 124/86 (BP Location: Left Arm, Patient Position: Sitting, Cuff Size: Normal)   Pulse 100   Ht 5' 4 (1.626 m)   Wt 226 lb (102.5 kg)   LMP  (LMP Unknown)   BMI 38.79 kg/m    Wt Readings from Last 3 Encounters:  02/06/24 226 lb (102.5 kg)  02/14/23 250 lb (113.4 kg)  12/29/22 240 lb (108.9 kg)    GEN: Well nourished, well developed in no acute distress NECK: No JVD; No carotid bruits CARDIAC: RRR, no murmurs, rubs, gallops RESPIRATORY:  Clear to auscultation without rales, wheezing or rhonchi  ABDOMEN: Soft, non-tender, non-distended EXTREMITIES:  No edema; No acute deformity      Assessment and Plan:  Peripheral arterial disease S/p left common femoral to above-knee popliteal bypass using vein for gangrenous left small toe S/p drug-coated balloon angioplasty to the left femoral popliteal bypass at the proximal and distal segments S/p revascularization of the occluded right SFA with atherectomy and drug-eluting stent placement - Over the past year she has been stable with  no claudication symptoms - She is able to walk, exercise, and play with her great grandchildren without any limitation - Continue aspirin  81 mg daily and clopidogrel  75 mg daily - Continue atorvastatin  40 mg daily - Plan to repeat her doppler studies as her last studies were completed on 10/2022 and noted to be stable at that time - Managed by Dr. Darron  Hypertension Blood pressure today is well-controlled at 124/86 - Continue metoprolol  tartrate 100 mg twice daily  Hyperlipidemia, LDL goal <70 LDL 75 on 07/2023 and just above target goal - She prefers to work on heart healthy dieting rather than further medication management - Can consider addition of ezetimibe if unable will reach goal.  She will have repeat studies with her primary - Continue atorvastatin  40 mg daily  T2DM A1c 6.6% on 07/2023 and controlled - Management per PCP  Inappropriate sinus tachycardia Well-controlled on current therapy - Continue metoprolol  tartrate 100 mg twice daily      Dispo:  Return in about 1 year (around 02/05/2025).  Signed, Dixon LITTIE Rana, NP

## 2024-02-08 ENCOUNTER — Other Ambulatory Visit: Payer: Self-pay | Admitting: Cardiovascular Disease

## 2024-02-13 ENCOUNTER — Ambulatory Visit (HOSPITAL_COMMUNITY)
Admission: RE | Admit: 2024-02-13 | Discharge: 2024-02-13 | Disposition: A | Discharge status: Home / Self Care | Source: Ambulatory Visit | Attending: Emergency Medicine | Admitting: Emergency Medicine

## 2024-02-13 ENCOUNTER — Ambulatory Visit (HOSPITAL_COMMUNITY)
Admission: RE | Admit: 2024-02-13 | Discharge: 2024-02-13 | Disposition: A | Source: Ambulatory Visit | Attending: Emergency Medicine

## 2024-02-13 DIAGNOSIS — I739 Peripheral vascular disease, unspecified: Secondary | ICD-10-CM | POA: Diagnosis present

## 2024-02-14 LAB — VAS US ABI WITH/WO TBI: Right ABI: 0.92

## 2024-02-16 ENCOUNTER — Ambulatory Visit: Payer: Self-pay | Admitting: Cardiovascular Disease

## 2024-02-16 DIAGNOSIS — I739 Peripheral vascular disease, unspecified: Secondary | ICD-10-CM

## 2024-02-17 ENCOUNTER — Encounter: Payer: Self-pay | Admitting: Internal Medicine

## 2024-04-17 ENCOUNTER — Telehealth: Payer: Self-pay | Admitting: Cardiovascular Disease

## 2024-04-17 NOTE — Telephone Encounter (Signed)
" °*  STAT* If patient is at the pharmacy, call can be transferred to refill team.   1. Which medications need to be refilled? (please list name of each medication and dose if known)  clopidogrel  (PLAVIX ) 75 MG tablet   2. Which pharmacy/location (including street and city if local pharmacy) is medication to be sent to? [facility] - [place], [place] - [address]  3. Do they need a 30 day or 90 day supply?  90 day supply "

## 2024-04-18 MED ORDER — CLOPIDOGREL BISULFATE 75 MG PO TABS: 75.0000 mg | ORAL_TABLET | Freq: Every day | ORAL | 3 refills | Status: AC

## 2024-04-18 NOTE — Telephone Encounter (Signed)
 Refill sent
